# Patient Record
Sex: Male | Born: 1945 | Race: White | Hispanic: No | Smoking: Former smoker
Health system: Southern US, Community
[De-identification: ages and names within clinical notes are randomized; demographics above are authoritative.]

## PROBLEM LIST (undated history)

## (undated) DIAGNOSIS — I10 Essential (primary) hypertension: Secondary | ICD-10-CM

## (undated) DIAGNOSIS — C679 Malignant neoplasm of bladder, unspecified: Secondary | ICD-10-CM

## (undated) DIAGNOSIS — E785 Hyperlipidemia, unspecified: Secondary | ICD-10-CM

## (undated) DIAGNOSIS — N289 Disorder of kidney and ureter, unspecified: Secondary | ICD-10-CM

## (undated) DIAGNOSIS — C801 Malignant (primary) neoplasm, unspecified: Secondary | ICD-10-CM

## (undated) HISTORY — DX: Hyperlipidemia, unspecified: E78.5

## (undated) HISTORY — PX: CORONARY STENT PLACEMENT: SHX1402

## (undated) HISTORY — DX: Essential (primary) hypertension: I10

---
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
## 2008-07-11 ENCOUNTER — Inpatient Hospital Stay (HOSPITAL_COMMUNITY): Admission: AD | Admit: 2008-07-11 | Discharge: 2008-07-13 | Payer: Self-pay | Admitting: Cardiology

## 2008-07-11 ENCOUNTER — Ambulatory Visit: Payer: Self-pay | Admitting: Cardiology

## 2008-07-22 ENCOUNTER — Ambulatory Visit: Payer: Self-pay | Admitting: Cardiology

## 2008-08-01 ENCOUNTER — Ambulatory Visit: Payer: Self-pay | Admitting: Cardiology

## 2008-10-13 ENCOUNTER — Ambulatory Visit: Payer: Self-pay | Admitting: Cardiology

## 2009-01-14 DIAGNOSIS — I200 Unstable angina: Secondary | ICD-10-CM

## 2009-01-14 DIAGNOSIS — F17200 Nicotine dependence, unspecified, uncomplicated: Secondary | ICD-10-CM | POA: Insufficient documentation

## 2009-01-14 DIAGNOSIS — E7800 Pure hypercholesterolemia, unspecified: Secondary | ICD-10-CM

## 2009-01-14 DIAGNOSIS — Z8679 Personal history of other diseases of the circulatory system: Secondary | ICD-10-CM | POA: Insufficient documentation

## 2009-01-14 DIAGNOSIS — I2510 Atherosclerotic heart disease of native coronary artery without angina pectoris: Secondary | ICD-10-CM | POA: Insufficient documentation

## 2009-08-11 ENCOUNTER — Telehealth: Payer: Self-pay | Admitting: Cardiology

## 2011-02-19 NOTE — Letter (Signed)
October 13, 2008    Birdie Riddle, MD  [address]  [place], [place] [address]   RE:  [name], [name]  MRN:  [id number]  /  DOB:  1946/03/14   Dear [name],   I had the pleasure of seeing your nice patient, David Rich, in the  office today in a followup visit.  From a cardiac standpoint, his chest  discomfort has essentially resolved.  As you know, he had had exertional  chest discomfort, and had a very positive stress test with a large area  of ischemia in the LAD territory.  He subsequently underwent stenting of  the diagonal with a drug-eluting stent.  His symptoms have been  essentially resolved since that time.  The clinical symptoms have  improved.  He did have problems with the Plavix, and subsequently was  placed Effient 10 mg daily, which he has continued.  The only  outstanding issue has been a problem that he has had a problem with his  penile prosthesis which he would like to get replaced.  This was ongoing  prior to even his coronary intervention.  They told him he would have to  be off all of his anticoagulants, and specifically, I told him that we  would prefer that he not be off FEN for at least 1 year.  After 1 year,  he could be on aspirin alone, but we are always obviously reluctant to  discontinue aspirin in any patient with a drug-eluting stent.  He denies  any ongoing chest pain at present.   Also, in reviewing the records he had told us previously that he had a  chest x-ray in your office, but we have no record of this.  He said he  was not aware of any abnormalities, but I am going to have my our office  staff contact your nurse to check on these results because he should  have one if he has had not.   Today on physical examination, he is alert and oriented and distress.  The blood pressure is 128/80, the pulse is 56.  The lung fields are  clear.  The cardiac rhythm is regular.   Electrocardiogram demonstrates sinus bradycardia and is otherwise  within  normal limits.   To summarize, David Rich is done well.  He does likely need to have a  lipid level, and I have encouraged him to call your office to get this  checked.  He has had a problem with statins in the past, but I would  certainly leave this to your discretion as to if and when he should be  treated given your area of expertise.  With regard to his cardiac  situation, he should remain on dual-antiplatelet therapy for a minimum  of a year.  After that, there remains some debate as to whether ongoing  dual-antiplatelet therapy is warranted.  Given his elective initial  presentation, it would be reasonable to consider discontinuation after 1  year with continuation of indefinite aspirin.   Thanks so much for allowing [place] to share in his care.    Sincerely,      Arturo Morton. Riley Kill, MD, [facility]  Electronically Signed    TDS/MedQ  DD: 10/13/2008  DT: 10/14/2008  Job #: [id number]

## 2011-02-19 NOTE — Letter (Signed)
August 01, 2008    Alan Mulder, MD  [address]  [place], [place] [address]   RE:  David Rich, David Rich  MRN:  [id number]  /  DOB:  [birth year]   Dear [name],   I had the pleasure of seeing, Antwine Agosto in the office today in a  followup visit.  As you know, he underwent stenting of the diagonal  coronary artery since discharge from the hospital.  He has done  relatively well.  He did develop a rash, which was presumed to be from  clopidogrel.  As a result, he was switched to prasugrel.  He and I also  discussed his aspirin dosing today.  In general, the patient has be  getting along well.  He has not had significant recurrence of symptoms  other than little bit of indigestion.  He has had a little bit of minor  bruising, but a CBC done shortly after his procedure was fairly  unremarkable.   MEDICATIONS:  1. Atenolol 25 mg daily.  2. Effient 10 mg daily.  3. Aspirin 81 mg 3 tablets daily.  4. Simvastatin 20 mg daily.   PHYSICAL EXAMINATION:  GENERAL:  He is alert and oriented, in no acute  distress.  VITAL SIGNS:  The blood pressure is 124/70 and the pulse is 55.  LUNGS:  Fields are clear.  CARDIAC:  Rhythm is regular.  ABDOMEN:  The groin is well healed.   The electrocardiogram demonstrates sinus bradycardia, and is otherwise  within normal limits.   To briefly summarize, this very nice gentleman is doing well from a  clinical standpoint.   PLAN:  I planned to have him return to see me in followup in  approximately 2 months.  I have asked him to make an appointment to see  you in followup.  At 6 months' time, I would recommend a simple  treadmill to make sure that he is able to exercise on a regular basis.  I have encouraged him to stop smoking.  I have also asked him to make an  appointment to discuss his future use of AndroGel.  Thanks so much for  allowing me to share in his care.  On exam today, the clopidogrel rash  has apparently faded.     Sincerely,      Arturo Morton. Riley Kill, MD, [facility]  Electronically Signed    TDS/MedQ  DD: 08/01/2008  DT: 08/02/2008  Job #: [id number]

## 2011-02-19 NOTE — H&P (Signed)
David Rich, David Rich                ACCOUNT NO.:  [id number]   MEDICAL RECORD NO.:  [id number]          PATIENT TYPE:  INP   LOCATION:  4734                         FACILITY:  [facility]   PHYSICIAN:  Arturo Morton. Riley Kill, MD, FACCDATE OF BIRTH:  15-Mar-1946   DATE OF ADMISSION:  07/11/2008  DATE OF DISCHARGE:                              HISTORY & PHYSICAL   REFERRING PHYSICIAN:  Alan Mulder, MD   CHIEF COMPLAINT:  Chest pain.   HISTORY OF PRESENT ILLNESS:  David Rich is a 65 year old gentleman who is  referred by Dr. Patrecia Rich.  He has a 2-week history complaining of  intermittent chest discomfort.  It lasts up to 30 minutes and does not  always have to be associated with activity, however, he cannot do all  that much recently.  He has been intolerant of Crestor and Lipitor in  the past and he wondered whether Trilipix was making him feel worse.  He  has a 25-year history of smoking.  The discomfort has not gotten any  worse over the past week or so, but he saw Dr. Patrecia Rich for further  evaluation.  Following this, the patient underwent radionuclide imaging.  This revealed a large stress-induced defect of the anterior wall seen on  3 projections with near complete redistribution.  There was an ejection  fraction of 50% with dilatation of the LV post stress.  On the exercise  electrocardiogram, he had multifocal PVCs and one 1 mm of ST depression  lasting about 3 minutes in the recovery period.  I got a call from Dr.  Patrecia Rich on Friday, we talked about admitting the patient, however, the  patient did not want to come in and as a result, the patient was brought  in today for further evaluation.  The stress images do reveal a complete  anterolateral area of redistribution.   PAST MEDICAL HISTORY:  The patient has not had prior surgery.  The  patient has had prior surgical penile implant for congenital anomaly,  hypospadias in 1995.  He did have an aspiration of meniscus for what was  thought to be a meniscal tear.   ALLERGIES:  There are no known allergies.  He does have intolerances to  the STATINS.   CURRENT MEDICATIONS:  1. Aspirin 325 mg daily.  2. Atenolol 25 mg daily.  3. Nitroglycerin p.r.n.  4. He has been on Trilipix 135 mg at bedtime.  He had previously been      on Crestor and also Lipitor.  5. He has also been taking an AndroGel pump.   SOCIAL HISTORY:  The patient smokes.  He has been smoking for 25 years.  He has been working full-time Automotive engineer at [employer].  He is married.  He does not regularly exercise.  He does  use caffeine as well.   FAMILY HISTORY:  Father died at 72 of a cerebral hemorrhage.  His mother  died at 19, but she did have a myocardial infarction.  The patient has a  twin brother.   REVIEW OF SYSTEMS:  His vision and hearing  have been intact.  He has had  no ear, nose, and throat symptoms.  He has not had any GI bleeding,  nausea, vomiting, or diarrhea.  The review of systems is otherwise  negative.   PHYSICAL EXAMINATION:  He is alert, oriented gentleman in no acute  distress.  Blood pressure is 131/72, the pulse is 49.  Jugular veins are  nondistended.  HEENT examination is unremarkable.  Carotid upstrokes are brisk.  PMI is  nondisplaced.  There is a normal first and second heart sound without  murmur, rub, or gallop.  The lung fields are clear to auscultation and  percussion.  Pulses are intact distally.   The electrocardiogram was entirely within normal limits.   IMPRESSION:  1. Recent chest pain with markedly abnormal stress myocardial      perfusion imaging study.  2. History of hypercholesterolemia.  3. History of statin intolerance.  4. Continued tobacco abuse.  5. Prior history of penile implant.   PLAN:  We plan to admit the patient to the hospital.  Risks, benefits,  and alternatives have been discussed with the patient.  We have  recommended cardiac catheterization.  He is  agreeable to proceed.      Arturo Morton. Riley Kill, MD, [facility]  Electronically Signed     TDS/MEDQ  D:  07/11/2008  T:  07/12/2008  Job:  [id number]   cc:   David Rich, M.D.

## 2011-02-19 NOTE — Cardiovascular Report (Signed)
David Rich, David Rich                ACCOUNT NO.:  [id number]   MEDICAL RECORD NO.:  [id number]          PATIENT TYPE:  INP   LOCATION:  6526                         FACILITY:  [facility]   PHYSICIAN:  David Morton. Riley Kill, MD, FACCDATE OF BIRTH:  August 17, 1946   DATE OF PROCEDURE:  07/12/2008  DATE OF DISCHARGE:                            CARDIAC CATHETERIZATION   INDICATIONS:  David Rich is a 65 year old gentleman who has had recurrent  episodes of chest and arm discomfort.  Radionuclide imaging has  demonstrated a large anteroapical perfusion defect.  He was referred for  further evaluation.  Cardiac catheterization was recommended.  Risks,  benefits, and alternatives were discussed with the patient in the office  in detail and he was admitted overnight because of rest chest pain.   PROCEDURES:  1. Left heart catheterization.  2. Selective coronary arteriography.  3. Selective left ventriculography.  4. Percutaneous transluminal coronary angioplasty and stenting of the      diagonal coronary artery.   DESCRIPTION OF THE PROCEDURE:  The patient was brought to the  Catheterization Laboratory and prepped and draped in the usual fashion.  Through an anterior puncture, the femoral artery was easily entered, a 5-  French sheath was placed.  We took views of the left and right coronary  arteries in multiple angiographic projections.  Central aortic and left  ventricular pressures were measured with pigtail.  Ventriculography was  then performed in the RAO projection.  There were no complications.  I  then reviewed the films with the patient, and also went out and  discussed them with his wife.  There was a high-grade lesion in the  diagonal coronary artery, which is at least a moderately large vessel.  As a result, a percutaneous intervention was recommended based upon his  class III symptoms.  The risks and alternatives to intervention were  discussed and the patient was agreeable to proceed.   The patient was agreeable to proceed, and subsequent to this, the 5-  [place] sheath was upgraded to a 6-French.  A 600 mg of oral clopidogrel  was administered as long with aspirin.  Bivalirudin was given according  to protocol.  ACT was checked and found to be appropriate for  percutaneous intervention.  A JL-4 guide was utilized.  We put down a  0.014 [place] wire without difficulty into the distal diagonal.  We  then predilated using a 2.25 x 8 Apex balloon.  After thoughtful  consideration, we felt that the lesion could be covered with a 12-mm  stent.  We debated for some time about whether to use a 2.25 or 2.5 mm  stent.  The area distal to the lesion was thought to be somewhat suspect  for a 2.5-mm stent, and as a result of that, we placed a 2.25 x 12 Adam  drug-eluting platform.  This was deployed at 15 atmospheres.  We then  went ahead and postdilated the stent, using a 2.5 Higginson Voyager balloon and  this was taken to 16 atmospheres.  Following this, we elected to still  upgrade to a 2.75 [place] Voyager, and  this was dilated between 12 and 13  atmospheres.  There was marked improvement in the appearance of the  artery with just a very minimal residual stenosis and excellent flow  into the distal vessel.  All catheters were subsequently removed and  final angiographic shots obtained.  There were no major complications.   HEMODYNAMIC DATA:  1. Central aortic pressure was 119/59.  2. Left ventricular pressure was 102/13.  3. There was no significant gradient on pullback across the aortic      valve.   ANGIOGRAPHIC DATA:  1. On plain fluoroscopy, there did not appear to be significant      coronary calcification.  2. Ventriculography was done in the RAO projection.  Overall, systolic      function was preserved.  Ejection fraction would be estimated at      65%.  No wall motion abnormalities were identified.  There did not      appear to be significant mitral regurgitation.  3. The  left main coronary was free of critical disease.  4. The left anterior descending artery courses into the apex.  It      provides a major diagonal.  The proximal LAD in the LAO cranial      view does have an eccentric plaque that would measure between 20      and 30% luminal reduction.  There was mild segmental plaquing after      the takeoff of the diagonal.  The diagonal itself has a fairly      short discrete 90% stenosis.  The vessel distally has some moderate      luminal irregularity.  Following stenting, this stenosis was      reduced to less than 10% residual luminal narrowing.  5. The circumflex provides a marginal, and an AV circumflex.  There is      minimal luminal irregularity but no evidence of high-grade      obstruction.  6. The right coronary artery is a moderately large vessel providing      posterior descending posterolateral system.  There is eccentric      plaquing of about 30% in the midvessel but no high-grade stenosis.   IMPRESSION:  1. Well-preserved left ventricular function.  2. Single-vessel coronary artery disease with a high-grade large      diagonal with successful percutaneous stenting using a drug-eluting      platform.   DISPOSITION:  At the present time, the patient would be treated  medically.  Aspirin and Plavix will be required for a minimum of 1 year  and potentially longer depending upon ongoing research trials.  We will  have him follow up in the office with [place].      David Morton. Riley Kill, MD, [facility]  Electronically Signed     TDS/MEDQ  D:  07/12/2008  T:  07/12/2008  Job:  [id number]   cc:   David Rich, M.D.  [facility]

## 2011-02-19 NOTE — Discharge Summary (Signed)
David Rich, David Rich                ACCOUNT NO.:  [id number]   MEDICAL RECORD NO.:  [id number]          PATIENT TYPE:  INP   LOCATION:  6526                         FACILITY:  [facility]   PHYSICIAN:  Arturo Morton. Riley Kill, MD, FACCDATE OF BIRTH:  1945-10-30   DATE OF ADMISSION:  07/11/2008  DATE OF DISCHARGE:  07/13/2008                               DISCHARGE SUMMARY   PRIMARY CARDIOLOGIST:  Maisie Fus D. Riley Kill, MD, [facility]   PRIMARY CARE PHYSICIAN:  Alan Mulder, MD   PROCEDURES PERFORMED DURING HOSPITALIZATION:  1. Cardiac catheterization performed by Dr. Shawnie Pons, completed      on July 12, 2008.      a.     Well-preserved left ventricular function with single vessel       coronary artery disease with high-grade large diagonal.      b.     Successful percutaneous coronary intervention using drug-       eluting platform secondary to distal diagonal stenosis of 90%       which was reduced to less than 10% following stenting.   FINAL DISCHARGE DIAGNOSES:  1. Coronary artery disease.      a.     Status post cardiac catheterization revealing single vessel       disease with high-grade large diagonal lesion.      b.     Status post percutaneous coronary intervention using a drug-       eluting stent to the diagonal decreasing lesion from 90% stenosis       to 10% stenosis.  2. Hypercholesterolemia.  3. Statin intolerance.  4. Tobacco abuse.  5. Erectile dysfunction with penile implant.   HOSPITAL COURSE:  This is a 65 year old Caucasian male who was referred  to Dr. Riley Kill by Dr. Patrecia Pace after 2-week history of complaining of  intermittent chest discomfort lasting approximately 30 minutes, not  always associated with activity.  The patient was intolerant to STATINS  in the past, and was started on Triflex and began to feel worse on this.  The patient also had a history of a 25 year smoking history.  The  patient did undergo radionuclide imaging revealing a large stress-  induced defect at the anterior wall seen at 3 projections and with near  complete redistribution with an EF of 50% secondary to this, the patient  was admitted to have cardiac catheterization for definitive evaluation  of coronary anatomy.   The patient did undergo cardiac catheterization, Dr. Riley Kill on July 12, 2008, with results discussed above, please see Dr. Rosalyn Charters serial  cardiac catheterization note for more details.  The patient tolerated  the procedure well without evidence of bleeding, hematoma, or signs of  infection or arrhythmias post procedure.  The patient had no further  complaints of chest discomfort.   The patient also had smoking cessation counseling and has agreed to quit  smoke, cold [place] on discharge.  The patient had gotten up walking  with cardiac rehab during the hospitalization and did not have any  complaints of chest discomfort with exertion.   On day of discharge,  the patient was seen and examined by Dr. Shawnie Pons and was found to be stable.  The patient will go home with the  addition of Plavix to current medication regimen and follow up with Dr.  Riley Kill in 2 weeks.   DISCHARGE VITAL SIGNS:  Blood pressure 126/65, pulse 69, respirations  16, and temperature 95%.   LABORATORIES ON DISCHARGE:  Sodium 134, potassium 3.8, chloride 100, CO2  23, glucose 96, BUN 8, and creatinine 0.63.  Hemoglobin 15.0, hematocrit  44.2, white blood cells 11.4, and platelets 214.  Cardiac enzymes  revealing troponin 0.03, 0.04, and 0.02 respectively.  The patient's PTT  was 30, PT 13.4, and INR of 1.0.   DISCHARGE MEDICATIONS:  1. Aspirin 325 mg daily.  2. Atenolol 25 mg daily.  3. Nitroglycerin 0.4 mg sublingual p.r.n. pain.  4. Plavix 75 mg p.o. daily (prescription provided).   ALLERGIES:  No known drug allergies.   FOLLOWUP PLANS AND APPOINTMENT:  1. The patient will follow up with Dr. Shawnie Pons on August 01, 2008, at 2 p.m.  2. The  patient has been given postcardiac catheterization instructions      with particular emphasis on the right groin site for evidence of      bleeding, hematoma, or signs of infection.  3. The patient to follow up with primary care physician on his own      accord for continued medical management.  4. The patient has been advised to bring all medications to follow up      appointment.   Time spent with the patient to include physician time of 40 minutes.      Bettey Mare. Lyman Bishop, NP      Arturo Morton. Riley Kill, MD, [facility]  Electronically Signed    KML/MEDQ  D:  07/13/2008  T:  07/13/2008  Job:  [id number]   cc:   Alan Mulder, M.D.

## 2011-02-22 NOTE — Discharge Summary (Signed)
NAMESHAMIR, TUZZOLINO                ACCOUNT NO.:  [id number]   MEDICAL RECORD NO.:  [id number]          PATIENT TYPE:  INP   LOCATION:  6526                         FACILITY:  [facility]   PHYSICIAN:  Arturo Morton. Riley Kill, MD, FACCDATE OF BIRTH:  01-Aug-1946   DATE OF ADMISSION:  07/11/2008  DATE OF DISCHARGE:  07/13/2008                               DISCHARGE SUMMARY   CARDIOLOGIST:  Maisie Fus D. Riley Kill, MD, [facility]   PRIMARY CARE PHYSICIAN:  Alan Mulder, MD   PROCEDURES PERFORMED DURING HOSPITALIZATION:  1. Cardiac catheterization per Dr. Shawnie Pons, on July 12, 2008.  2. Status post percutaneous coronary intervention to the diagonal,      reducing it from 90% to 0% using a Taxus Liberty drug-eluting stent      2.25 mm x 12 mm.   FINAL DISCHARGE DIAGNOSES:  1. Coronary artery disease.  1a.  Status post cardiac catheterization on July 12, 2008, revealing  90% diagonal stenosis.  1. Unstable angina.  2. History of congestive heart failure.  3. Tobacco abuse.   HOSPITAL COURSE:  This is a 65 year old male referred to [place] by Dr.  Patrecia Pace with a 2-week history of intermittent chest discomfort lasting  approximately 30 minutes and has not always have to be associated with  activity.  The patient's chest discomfort became worse over the week  prior to admission.  He saw Dr. Patrecia Pace for further evaluation and the  patient had a nuclear medicine stress test, it revealed a large stress  inducing defect in the anterior wall seen in 3 projections with near  complete redistribution with an EF of 30%.  As a result of this, the  patient planned for cardiac catheterization secondary to this abnormal  stress test.  The patient was noted on EKG to have ST depression lasting  about 3 minutes in the recovery post exercise.   The patient was seen and examined by Dr. Shawnie Pons and cardiac  catheterization was planned.  The patient tolerated the procedure well  without any evidence of  bleeding, hematoma, or signs of infection.  It  was found that the patient had a 90% diagonal lesion, which was  intervened upon by Dr. Riley Kill using a Taxus drug-eluting stent 12 x  2.25 mm, reducing it from 95% to less than 10%.  His EF remained normal  at 65%.   The patient was followed post procedure and was started on statins.  He  was also enrolled in the ADAPT-DES stent study.  He had some tobacco  cessation during hospitalization by specified staff and verbalized  understanding and stated that he would stop smoking.  The patient also  worked with cardiac rehab and was up walking around without any  recurrence of angina.  The patient was seen and examined by Dr. Shawnie Pons on day of discharge and found to be stable and he was discharged  home with follow up with Dr. Riley Kill in approximately 2 weeks.   DISCHARGE LABORATORIES:  Sodium 134, potassium 3.8, chloride 100, CO2 of  23, BUN 8, creatinine 0.83, and glucose 96.  Hemoglobin  15.0, hematocrit  44.2, white blood cells 11.4, and platelets 214.  Cardiac enzymes were  cycled and troponin 0.02, 0.03, and 0.02 respectively.   VITAL SIGNS ON DISCHARGE:  Blood pressure 126/65, pulse 69, respirations  16, temperature 98.9, and O2 sat 95% on room air.   DISCHARGE MEDICATIONS:  1. Aspirin 325 daily.  2. Atenolol 25 mg daily.  3. Nitroglycerin 0.4 mg sublingual p.r.n. pain.  4. Plavix 75 mg p.o. daily.   ALLERGIES:  No known drug allergies.   FOLLOWUP PLANS AND APPOINTMENTS:  The patient will be followed up by Dr.  Shawnie Pons on August 01, 2008, at 2 p.m.   The patient has been given post cardiac catheterization instructions  with particular emphasis on the right groin site for evidence of  bleeding, hematoma, or signs of infection.  The patient is to follow up  with primary care physician for continued medical management.   Time spent with the patient to include physician time 40 minutes.      Bettey Mare. Lyman Bishop,  NP      Arturo Morton. Riley Kill, MD, [facility]  Electronically Signed    KML/MEDQ  D:  08/22/2008  T:  08/23/2008  Job:  [id number]

## 2011-02-25 ENCOUNTER — Ambulatory Visit: Payer: Self-pay | Admitting: Gastroenterology

## 2011-07-09 LAB — COMPREHENSIVE METABOLIC PANEL
ALT: 26
AST: 19
Albumin: 3.5
Alkaline Phosphatase: 68
Chloride: 107
GFR calc Af Amer: >60
Potassium: 3.7
Sodium: 138
Total Bilirubin: 0.4
Total Protein: 5.9 — ABNORMAL LOW

## 2011-07-09 LAB — BASIC METABOLIC PANEL
BUN: 8
CO2: 23
Chloride: 100
Creatinine, Ser: 0.83
Glucose, Bld: 96
Potassium: 3.8

## 2011-07-09 LAB — PROTIME-INR: INR: 1

## 2011-07-09 LAB — CBC
HCT: 41.4
HCT: 41.8
HCT: 44.2
Hemoglobin: 14.2
Hemoglobin: 14.2
MCHC: 33.9
MCHC: 34
MCHC: 34.3
MCV: 94.9
MCV: 95
Platelets: 214
RBC: 4.36
RBC: 4.36
RDW: 14.6
WBC: 11.4 — ABNORMAL HIGH
WBC: 9.4

## 2011-07-09 LAB — CARDIAC PANEL(CRET KIN+CKTOT+MB+TROPI)
CK, MB: 1.8
CK, MB: 2
Relative Index: 1.8
Relative Index: 2
Relative Index: INVALID
Total CK: 101
Troponin I: 0.04

## 2011-07-09 LAB — HEPARIN LEVEL (UNFRACTIONATED): Heparin Unfractionated: 0.42

## 2011-10-08 DIAGNOSIS — C801 Malignant (primary) neoplasm, unspecified: Secondary | ICD-10-CM

## 2011-10-08 HISTORY — DX: Malignant (primary) neoplasm, unspecified: C80.1

## 2011-10-08 HISTORY — PX: OTHER SURGICAL HISTORY: SHX169

## 2012-03-23 ENCOUNTER — Ambulatory Visit: Payer: Self-pay | Admitting: Urology

## 2012-03-23 LAB — BASIC METABOLIC PANEL
BUN: 16 mg/dL (ref 7–18)
Calcium, Total: 8.8 mg/dL (ref 8.5–10.1)
Co2: 25 mmol/L (ref 21–32)
Glucose: 83 mg/dL (ref 65–99)
Potassium: 4.1 mmol/L (ref 3.5–5.1)
Sodium: 139 mmol/L (ref 136–145)

## 2012-03-24 LAB — URINE CULTURE

## 2012-03-25 ENCOUNTER — Ambulatory Visit: Payer: Self-pay | Admitting: Urology

## 2012-04-22 DIAGNOSIS — I2510 Atherosclerotic heart disease of native coronary artery without angina pectoris: Secondary | ICD-10-CM

## 2012-04-28 DIAGNOSIS — C679 Malignant neoplasm of bladder, unspecified: Secondary | ICD-10-CM | POA: Insufficient documentation

## 2012-04-28 DIAGNOSIS — N529 Male erectile dysfunction, unspecified: Secondary | ICD-10-CM | POA: Insufficient documentation

## 2012-05-09 ENCOUNTER — Emergency Department: Payer: Self-pay | Admitting: *Deleted

## 2012-08-06 DIAGNOSIS — T83490A Other mechanical complication of penile (implanted) prosthesis, initial encounter: Secondary | ICD-10-CM | POA: Insufficient documentation

## 2015-01-29 NOTE — Op Note (Signed)
PATIENT NAME:  David Rich, David Rich MR#:  [id number] DATE OF BIRTH:  [birth year]  DATE OF PROCEDURE:  03/25/2012  PREOPERATIVE DIAGNOSES:  1. Gross hematuria.  2. Right ureteral mass. 3. Bladder lesion.   POSTOPERATIVE DIAGNOSES:  1. Gross hematuria.  2. Right ureteral mass. 3. Bladder lesion.   PROCEDURES: 1. Right ureteroscopy with biopsy right ureteral tumor. 2. Placement of right ureteral stent.  3. Transurethral resection of bladder tumor-small.    SURGEON: Brynja Marker C. Bernardo Heater, MD  ASSISTANT: None.   ANESTHETIC: General.   INDICATION: 69 year old male with a history of intermittent gross hematuria. CT urogram was remarkable for a 3 cm mass in the right mid ureter with proximal hydronephrosis and hydroureter. Office cystoscopy showed a 5 mm lesion at the bladder neck.   DESCRIPTION OF PROCEDURE: Patient was taken to the Operating Room where a general anesthetic was administered. He was placed in the low lithotomy position and his external genitalia were prepped and draped in the usual fashion. Time out was performed per protocol. A 27 French continuous flow resectoscope was used with the visual obturator. It was lubricated and passed per urethra. The urethra was normal in caliber without stricture. Prostate with moderate lateral lobe enlargement and moderate bladder neck elevation. The sheath was advanced into the bladder. The [facility] resectoscope with loop was then placed into the sheath. Bladder mucosa was closely inspected. The 5 mm lesion at the bladder neck was smooth and mucosal covered. This was resected. No other lesions were identified. This lesion was resected with one pass of the loop. Resection site was cauterized. The adapter was placed in the resectoscope sheath. A 5 French open-ended ureteral catheter was placed in the distal ureter. Right retrograde pyelogram remarkable for a filling defect in the right mid ureter with a goblet sign. There was proximal hydroureter and  hydronephrosis. A 0.035 guidewire was placed through the ureteral catheter and passed up to the renal pelvis. The ureteral catheter and resectoscope were removed. A 6 French semirigid ureteroscope was lubricated and passed under direct vision. The right ureteral orifice was easily engaged without dilation. In the mid ureter a papillary tumor was identified consistent with a transitional cell carcinoma endoscopically. Pieces of the tumor were biopsied several times with transureteroscopic Pirana forceps. A fairly deepened portion of tissue was able to be snared with a three prong grasper. No significant bleeding was noted. The ureteroscope was removed. A 6 French/26 cm contour ureteral stent placed over the wire. Cystoscope was repassed. Distal end of stent was well positioned in the bladder. Proximal end of stent was well positioned under fluoroscopic visualization. The bladder resection site was free of bleeding. It was elected not place a Foley catheter. Bladder was emptied and cystoscope was removed. Patient taken to PAC-U in stable condition. There were no sessions. EBL minimal.  ____________________________ Ronda Fairly. Bernardo Heater, MD scs:cms D: 03/25/2012 19:41:59 ET T: 03/26/2012 08:53:27 ET JOB#: [id number]  cc: Nicki Reaper C. Bernardo Heater, MD, <Dictator>  Abbie Sons MD ELECTRONICALLY SIGNED 04/20/2012 9:10

## 2015-09-19 DIAGNOSIS — C801 Malignant (primary) neoplasm, unspecified: Secondary | ICD-10-CM | POA: Insufficient documentation

## 2016-07-11 DIAGNOSIS — M1610 Unilateral primary osteoarthritis, unspecified hip: Secondary | ICD-10-CM | POA: Insufficient documentation

## 2016-07-11 DIAGNOSIS — M5136 Other intervertebral disc degeneration, lumbar region: Secondary | ICD-10-CM | POA: Insufficient documentation

## 2017-07-29 ENCOUNTER — Emergency Department
Admission: EM | Admit: 2017-07-29 | Discharge: 2017-07-29 | Disposition: A | Discharge status: Home / Self Care | Payer: Medicare Other | Attending: Emergency Medicine | Admitting: Emergency Medicine

## 2017-07-29 ENCOUNTER — Encounter: Payer: Self-pay | Admitting: Emergency Medicine

## 2017-07-29 DIAGNOSIS — I509 Heart failure, unspecified: Secondary | ICD-10-CM | POA: Diagnosis not present

## 2017-07-29 DIAGNOSIS — I110 Hypertensive heart disease with heart failure: Secondary | ICD-10-CM | POA: Insufficient documentation

## 2017-07-29 DIAGNOSIS — Z87898 Personal history of other specified conditions: Secondary | ICD-10-CM

## 2017-07-29 DIAGNOSIS — R040 Epistaxis: Secondary | ICD-10-CM | POA: Diagnosis not present

## 2017-07-29 DIAGNOSIS — I10 Essential (primary) hypertension: Secondary | ICD-10-CM

## 2017-07-29 HISTORY — DX: Disorder of kidney and ureter, unspecified: N28.9

## 2017-07-29 MED ORDER — AMLODIPINE BESYLATE 2.5 MG PO TABS: 2.5000 mg | ORAL_TABLET | Freq: Every day | ORAL | 0 refills | Status: DC

## 2017-07-29 MED ORDER — AMLODIPINE BESYLATE 5 MG PO TABS
2.5000 mg | ORAL_TABLET | Freq: Once | ORAL | Status: AC
  Administered 2017-07-29: 2.5 mg via ORAL
  Filled 2017-07-29: qty 1

## 2017-07-29 NOTE — ED Notes (Addendum)
Denies headaches, nausea, and SOB. States he does not take blood pressure medications, denies Hx of hypertension, denies chest pain, denies dizziness. Nose is not bleeding at this time.

## 2017-07-29 NOTE — ED Notes (Signed)
Patient does not appear to be in any acute distress at time of discharge. Patient ambulatory to lobby with steady gate. Patient denies any comments or concerns regarding discharge.  

## 2017-07-29 NOTE — ED Notes (Signed)
AAOx3.  Skin warm and dry.  NAD 

## 2017-07-29 NOTE — ED Triage Notes (Addendum)
Patient presents to ED via POV from home with c/o epistaxis and high blood pressure. Patient denies CP or SOB. Patient denies history of high blood pressure. No nose bleed at this time.

## 2017-07-29 NOTE — Discharge Instructions (Signed)
Attend your scheduled appointment with Dr. Lovie Macadamia tomorrow 9:30 for continued care.  If symptoms worsen prior to your scheduled appointment do not hesitate returning to emergency department.  If the medications worsen symptoms do not hesitate to return to emergency department.

## 2017-07-29 NOTE — ED Provider Notes (Signed)
[facility] Emergency Department Provider Note   ____________________________________________   I have reviewed the triage vital signs and the nursing notes.   HISTORY  Chief Complaint Hypertension    HPI David Rich is a 71 y.o. male presents to the emergency department with elevated blood pressure and third episode of a epistaxis. Patient reports no past history of hypertension however he notes over the last week with standing and mild exertion intermittent nosebleeds. He has taken his blood pressure, noting it to be increased. Patient denies headache, blurred vision, nausea, double vision, or weakness. Patient came for evaluation after today's nosebleed. He reports the bleeding lasted for 15 minutes. Patient takes 162 mg ASA, fish oil and garlic daily. He is a non-smoker. Patient has an appointment with his PCP tomorrow at 9:30 am. Patient reports BP readings ~180/90's although triage reading was not at elevated. Measurement taken during history was more elevated, 184/96. No symptoms or epistaxis noted.  Patient denies fever, chills, headache, vision changes, chest pain, chest tightness, shortness of breath, abdominal pain, nausea and vomiting.  Past Medical History:  Diagnosis Date  . Renal disorder     Patient Active Problem List   Diagnosis Date Noted  . HYPERCHOLESTEROLEMIA 01/14/2009  . TOBACCO ABUSE 01/14/2009  . ANGINA, UNSTABLE 01/14/2009  . CAD 01/14/2009  . CONGESTIVE HEART FAILURE, HX OF 01/14/2009    Past Surgical History:  Procedure Laterality Date  . CORONARY STENT PLACEMENT      Prior to Admission medications   Medication Sig Start Date End Date Taking? Authorizing Provider  amLODipine (NORVASC) 2.5 MG tablet Take 1 tablet (2.5 mg total) by mouth daily. 07/29/17 07/29/18  Little, Traci M, PA-C    Allergies Statins  No family history on file.  Social History Social History  Substance Use Topics  . Smoking status: Never  Smoker  . Smokeless tobacco: Never Used  . Alcohol use Yes     Comment: Social    Review of Systems Constitutional: Negative for fever/chills Eyes: No visual changes. Cardiovascular: Denies chest pain. Respiratory: Denies shortness of breath. Gastrointestinal: No abdominal pain.  No nausea, vomiting, diarrhea. Musculoskeletal: Negative for back pain. Skin: Negative for rash. Neurological: Negative for headaches.  Negative focal weakness or numbness.  ____________________________________________   PHYSICAL EXAM:  VITAL SIGNS: Patient Vitals for the past 24 hrs:  BP Temp Temp src Pulse Resp SpO2 Height Weight  07/29/17 1622 (!) 184/96 - - 67 16 98 % - -  07/29/17 1529 (!) 151/77 - - - - - 6' (1.829 m) 86.2 kg (190 lb)  07/29/17 1527 - 97.9 F (36.6 C) Oral 72 14 98 % - -    Constitutional: Alert and oriented. Well appearing and in no acute distress.  Eyes: Conjunctivae are normal. PERRL. EOMI  Head: Normocephalic and atraumatic. ENT:      Ears: Canals clear. TMs intact bilaterally.      Nose: No congestion/rhinnorhea. Residual dried blood in nares.       Mouth/Throat: Mucous membranes are moist. Cardiovascular: Normal rate, regular rhythm. Normal S1 and S2.  Good peripheral circulation. Respiratory: Normal respiratory effort without tachypnea or retractions. Lungs CTAB.  Musculoskeletal: Nontender with normal range of motion in all extremities. Neurologic: Normal speech and language. No gross focal neurologic deficits are appreciated. No gait instability.  Skin:  Skin is warm, dry and intact. No rash noted. Psychiatric: Mood and affect are normal. Speech and behavior are normal. Patient exhibits appropriate insight and judgement.  ____________________________________________   LABS (all labs ordered are listed, but only abnormal results are displayed)  Labs Reviewed - No data to  display ____________________________________________  EKG none ____________________________________________  RADIOLOGY none ____________________________________________   PROCEDURES  Procedure(s) performed: no    Critical Care performed: no ____________________________________________   INITIAL IMPRESSION / ASSESSMENT AND PLAN / ED COURSE  Pertinent labs & imaging results that were available during my care of the patient were reviewed by me and considered in my medical decision making (see chart for details).  Patient presents to emergency department with episodes of elevated blood pressure and epistaxis. History and physical exam findings are consistent with likely new onset hypertension. Patient will be following up with his primary care provider tomorrow for continued care. Patient will be prescribed short course amloidpine 2.5 mg until follow up. I highly recommended him to attend this appointment in that his PCP would be the best to manage and monitor his blood pressure changes long term. Reassessment prior to discharge was reassuring and patient verbalized attending PCP appointment tomorrow. Patient advised to return to the emergency department if symptoms return or worsen prior to attending the PCP if needed. Patient informed of clinical course, understand medical decision-making process, and agree with plan.   FINAL CLINICAL IMPRESSION(S) / ED DIAGNOSES  Final diagnoses:  Hypertension, unspecified type  H/O epistaxis       NEW MEDICATIONS STARTED DURING THIS VISIT:  Discharge Medication List as of 07/29/2017  4:38 PM    START taking these medications   Details  amLODipine (NORVASC) 2.5 MG tablet Take 1 tablet (2.5 mg total) by mouth daily., Starting Tue 07/29/2017, Until Wed 07/29/2018, Print         Note:  This document was prepared using Dragon voice recognition software and may include unintentional dictation errors.    Jerolyn Shin, PA-C 07/29/17  1715    Eula Listen, MD 07/29/17 2259

## 2017-10-01 DIAGNOSIS — I10 Essential (primary) hypertension: Secondary | ICD-10-CM | POA: Insufficient documentation

## 2018-03-20 ENCOUNTER — Other Ambulatory Visit: Payer: Self-pay

## 2018-03-20 ENCOUNTER — Encounter: Payer: Self-pay | Admitting: Oncology

## 2018-03-20 ENCOUNTER — Inpatient Hospital Stay: Payer: Medicare Other | Attending: Oncology | Admitting: Oncology

## 2018-03-20 VITALS — BP 149/85 | HR 73 | Temp 97.0°F | Resp 18 | Ht 71.65 in | Wt 192.2 lb

## 2018-03-20 DIAGNOSIS — Z87891 Personal history of nicotine dependence: Secondary | ICD-10-CM

## 2018-03-20 DIAGNOSIS — Z803 Family history of malignant neoplasm of breast: Secondary | ICD-10-CM

## 2018-03-20 DIAGNOSIS — C679 Malignant neoplasm of bladder, unspecified: Secondary | ICD-10-CM | POA: Diagnosis not present

## 2018-03-20 NOTE — Progress Notes (Signed)
[facility]  [phone] Fax:([phone]) [phone]  ID: David Rich OB: 10-29-1945  MR#: [id number]  HCW#:[id number]  Patient Care Team: Juluis Pitch, MD as PCP - General (Family Medicine)  CHIEF COMPLAINT: Urothelial carcinoma.  INTERVAL HISTORY: Patient is a 72 year old male with a long-standing history of noninvasive urothelial carcinoma.  He recently underwent TURBT on Feb 18, 2018 that revealed recurrent and extravesical disease.  Subsequent MRI of the pelvis revealed likely residual tumor.  Patient was referred by his primary care physician for second opinion and transfer care.  He currently feels well and is asymptomatic.  He has no neurologic complaints.  He denies any recent fevers or illnesses.  He has a good appetite and denies weight loss.  He has no chest pain or shortness of breath.  He denies any nausea, vomiting, constipation, or diarrhea.  He has no current urinary complaints.  Patient offers no specific complaints today.  REVIEW OF SYSTEMS:   Review of Systems  Constitutional: Negative.  Negative for fever, malaise/fatigue and weight loss.  Respiratory: Negative.  Negative for cough and shortness of breath.   Cardiovascular: Negative.  Negative for chest pain and leg swelling.  Gastrointestinal: Negative.  Negative for abdominal pain.  Genitourinary: Negative.  Negative for dysuria and hematuria.  Musculoskeletal: Negative.  Negative for back pain.  Skin: Negative.  Negative for rash.  Neurological: Negative.  Negative for sensory change, focal weakness and weakness.  Psychiatric/Behavioral: Negative.  The patient is not nervous/anxious.     As per HPI. Otherwise, a complete review of systems is negative.  PAST MEDICAL HISTORY: Past Medical History:  Diagnosis Date  . Hyperlipidemia   . Hypertension   . Renal disorder     PAST SURGICAL HISTORY: Past Surgical History:  Procedure Laterality Date  . CORONARY STENT PLACEMENT    .  kidney removed Left 2013    FAMILY HISTORY: Family History  Problem Relation Age of Onset  . Coronary artery disease Mother   . Anemia Mother   . Kidney failure Mother   . Subarachnoid hemorrhage Father   . Breast cancer Sister 18  . Hypertension Brother     ADVANCED DIRECTIVES (Y/N):  N  HEALTH MAINTENANCE: Social History   Tobacco Use  . Smoking status: Former Smoker    Packs/day: 1.00    Years: 55.00    Pack years: 55.00    Types: Cigarettes    Last attempt to quit: 03/20/2012    Years since quitting: 6.0  . Smokeless tobacco: Never Used  Substance Use Topics  . Alcohol use: Yes    Comment: Social  . Drug use: No     Colonoscopy:  PAP:  Bone density:  Lipid panel:  Allergies  Allergen Reactions  . Clopidogrel Rash and Hives  . Rosuvastatin Calcium Anaphylaxis  . Etodolac Nausea And Vomiting and Other (See Comments)    Bad dreams Bad dreams   . Hydrocodone Other (See Comments)    Bad dreams Bad dreams   . Other   . Statins   . Meloxicam Rash    Bad dreams Bad dreams   . Pravastatin Sodium Rash    Current Outpatient Medications  Medication Sig Dispense Refill  . amLODipine (NORVASC) 2.5 MG tablet Take 1 tablet (2.5 mg total) by mouth daily. 7 tablet 0  . aspirin EC 81 MG tablet Take by mouth.    . Garlic 176 MG TABS Take by mouth.     . metoprolol tartrate (LOPRESSOR) 25 MG  tablet Take 25 mg by mouth.     . Multiple Vitamin (MULTI-VITAMINS) TABS Take by mouth.    [name] acetaminophen (TYLENOL) 325 MG tablet Take by mouth.     No current facility-administered medications for this visit.     OBJECTIVE: Vitals:   03/20/18 1333  BP: (!) 149/85  Pulse: 73  Resp: 18  Temp: (!) 97 F (36.1 C)     Body mass index is 26.33 kg/m.    ECOG FS:0 - Asymptomatic  General: Well-developed, well-nourished, no acute distress. Eyes: Pink conjunctiva, anicteric sclera. HEENT: Normocephalic, moist mucous membranes, clear oropharnyx. Lungs: Clear to  auscultation bilaterally. Heart: Regular rate and rhythm. No rubs, murmurs, or gallops. Abdomen: Soft, nontender, nondistended. No organomegaly noted, normoactive bowel sounds. Musculoskeletal: No edema, cyanosis, or clubbing. Neuro: Alert, answering all questions appropriately. Cranial nerves grossly intact. Skin: No rashes or petechiae noted. Psych: Normal affect. Lymphatics: No cervical, calvicular, axillary or inguinal LAD.   LAB RESULTS:  Lab Results  Component Value Date   NA 139 03/23/2012   K 4.1 03/23/2012   CL 108 (H) 03/23/2012   CO2 25 03/23/2012   GLUCOSE 83 03/23/2012   BUN 16 03/23/2012   CREATININE 1.27 03/23/2012   CALCIUM 8.8 03/23/2012   PROT 5.9 (L) 07/11/2008   ALBUMIN 3.5 07/11/2008   AST 19 07/11/2008   ALT 26 07/11/2008   ALKPHOS 68 07/11/2008   BILITOT 0.4 07/11/2008   GFRNONAA 60 (L) 03/23/2012   GFRAA >60 03/23/2012    Lab Results  Component Value Date   WBC 11.4 (H) 07/13/2008   HGB 15.0 07/13/2008   HCT 44.2 07/13/2008   MCV 94.9 07/13/2008   PLT 214 07/13/2008     STUDIES: No results found.  ASSESSMENT: Urothelial carcinoma, unclear stage.  PLAN:  1.  Urothelial carcinoma: Unclear stage.  Patient appears to have extravesical disease.  He wishes to avoid surgery to have his bladder removed, but expressed understanding this may be his only option for possible cure.  He is willing to do chemotherapy and possibly XRT if needed.  Will get a PET scan to evaluate the extent of his disease and then discuss further at tumor board next week.  Patient will follow-up in approximately 1 week after his PET scan to discuss the results and treatment planning if necessary.  I spent a total of 60 minutes face-to-face with the patient of which greater than 50% of the visit was spent in counseling and coordination of care as summarized above.  Patient expressed understanding and was in agreement with this plan. He also understands that He can call clinic  at any time with any questions, concerns, or complaints.   Cancer Staging No matching staging information was found for the patient.  [name], MD   03/24/2018 10:56 PM

## 2018-03-20 NOTE — Progress Notes (Signed)
Here for new pt evaluation. Here w daughter .( a nurse at [employer] )

## 2018-03-23 DIAGNOSIS — C679 Malignant neoplasm of bladder, unspecified: Secondary | ICD-10-CM | POA: Insufficient documentation

## 2018-03-23 NOTE — Progress Notes (Signed)
[place]  [phone] [phone]  ID: David Rich OB: 05/01/1946  MR#: [id number]  GYI#:[id number]  Patient Care Team: Juluis Pitch, MD as PCP - General (Family Medicine)  CHIEF COMPLAINT: Urothelial carcinoma  INTERVAL HISTORY: Patient returns to clinic today for further evaluation, discussion of his imaging results, and treatment planning.  He continues to feel well and remains asymptomatic. He has no neurologic complaints.  He denies any recent fevers or illnesses.  He has a good appetite and denies weight loss.  He has no chest pain or shortness of breath.  He denies any nausea, vomiting, constipation, or diarrhea.  He has no current urinary complaints.  Patient offers no specific complaints today.  REVIEW OF SYSTEMS:   Review of Systems  Constitutional: Negative.  Negative for fever, malaise/fatigue and weight loss.  Respiratory: Negative.  Negative for cough and shortness of breath.   Cardiovascular: Negative.  Negative for chest pain and leg swelling.  Gastrointestinal: Negative.  Negative for abdominal pain.  Genitourinary: Negative.  Negative for hematuria.  Musculoskeletal: Negative.  Negative for back pain.  Skin: Negative.  Negative for rash.  Neurological: Negative.  Negative for sensory change, focal weakness and weakness.  Psychiatric/Behavioral: Negative.  The patient is not nervous/anxious.     As per HPI. Otherwise, a complete review of systems is negative.  PAST MEDICAL HISTORY: Past Medical History:  Diagnosis Date  . Hyperlipidemia   . Hypertension   . Renal disorder     PAST SURGICAL HISTORY: Past Surgical History:  Procedure Laterality Date  . CORONARY STENT PLACEMENT    . kidney removed Left 2013    FAMILY HISTORY: Family History  Problem Relation Age of Onset  . Coronary artery disease Mother   . Anemia Mother   . Kidney failure Mother   . Subarachnoid hemorrhage Father   . Breast cancer  Sister 30  . Hypertension Brother     ADVANCED DIRECTIVES (Y/N):  N  HEALTH MAINTENANCE: Social History   Tobacco Use  . Smoking status: Former Smoker    Packs/day: 1.00    Years: 55.00    Pack years: 55.00    Types: Cigarettes    Last attempt to quit: 03/20/2012    Years since quitting: 6.0  . Smokeless tobacco: Never Used  Substance Use Topics  . Alcohol use: Yes    Comment: Social  . Drug use: No     Colonoscopy:  PAP:  Bone density:  Lipid panel:  Allergies  Allergen Reactions  . Clopidogrel Rash and Hives  . Rosuvastatin Calcium Anaphylaxis  . Etodolac Nausea And Vomiting and Other (See Comments)    Bad dreams Bad dreams   . Hydrocodone Other (See Comments)    Bad dreams Bad dreams   . Other   . Statins   . Meloxicam Rash    Bad dreams Bad dreams   . Pravastatin Sodium Rash    Current Outpatient Medications  Medication Sig Dispense Refill  . acetaminophen (TYLENOL) 325 MG tablet Take by mouth.    [name] amLODipine (NORVASC) 2.5 MG tablet Take 1 tablet (2.5 mg total) by mouth daily. 7 tablet 0  . aspirin EC 81 MG tablet Take by mouth.    . Garlic 350 MG TABS Take by mouth.     . metoprolol tartrate (LOPRESSOR) 25 MG tablet Take 25 mg by mouth.     . Multiple Vitamin (MULTI-VITAMINS) TABS Take by mouth.    . Omega-3 Fatty Acids (FISH OIL  OMEGA-3) 1000 MG CAPS Take by mouth daily.     No current facility-administered medications for this visit.     OBJECTIVE: Vitals:   03/26/18 1555  BP: (!) 159/86  Pulse: 68  Resp: 18  Temp: (!) 97.5 F (36.4 C)     Body mass index is 26.47 kg/m.    ECOG FS:0 - Asymptomatic  General: Well-developed, well-nourished, no acute distress. Eyes: Pink conjunctiva, anicteric sclera. HEENT: Normocephalic, moist mucous membranes, clear oropharnyx. Lungs: Clear to auscultation bilaterally. Heart: Regular rate and rhythm. No rubs, murmurs, or gallops. Abdomen: Soft, nontender, nondistended. No organomegaly noted,  normoactive bowel sounds. Musculoskeletal: No edema, cyanosis, or clubbing. Neuro: Alert, answering all questions appropriately. Cranial nerves grossly intact. Skin: No rashes or petechiae noted. Psych: Normal affect. Lymphatics: No cervical, calvicular, axillary or inguinal LAD.   LAB RESULTS:  Lab Results  Component Value Date   NA 139 03/23/2012   K 4.1 03/23/2012   CL 108 (H) 03/23/2012   CO2 25 03/23/2012   GLUCOSE 83 03/23/2012   BUN 16 03/23/2012   CREATININE 1.27 03/23/2012   CALCIUM 8.8 03/23/2012   PROT 5.9 (L) 07/11/2008   ALBUMIN 3.5 07/11/2008   AST 19 07/11/2008   ALT 26 07/11/2008   ALKPHOS 68 07/11/2008   BILITOT 0.4 07/11/2008   GFRNONAA 60 (L) 03/23/2012   GFRAA >60 03/23/2012    Lab Results  Component Value Date   WBC 11.4 (H) 07/13/2008   HGB 15.0 07/13/2008   HCT 44.2 07/13/2008   MCV 94.9 07/13/2008   PLT 214 07/13/2008     STUDIES: Nm Pet Image Initial (pi) Skull Base To Thigh  Result Date: 03/25/2018 CLINICAL DATA:  Initial treatment strategy for bladder cancer. EXAM: NUCLEAR MEDICINE PET SKULL BASE TO THIGH TECHNIQUE: 10.41 mCi F-18 FDG was injected intravenously. Full-ring PET imaging was performed from the skull base to thigh after the radiotracer. CT data was obtained and used for attenuation correction and anatomic localization. Fasting blood glucose: 95 mg/dl COMPARISON:  None. FINDINGS: Mediastinal blood pool activity: SUV max 2.05 NECK: No definite neck mass or cervical lymphadenopathy. There is an enlarged and hypermetabolic supraclavicular node on the left side this measures 11 mm on image number 66 and has an SUV max of 9.67. Incidental CT findings: none CHEST: No axillary lymphadenopathy. No enlarged or hypermetabolic mediastinal or hilar lymph nodes. Small right-sided retrocrural lymph nodes with hypermetabolism and SUV max of 5.27. No worrisome pulmonary lesions to suggest pulmonary metastatic disease or primary lung neoplasm. There are  emphysematous changes and pulmonary scarring. Incidental CT findings: none ABDOMEN/PELVIS: 18 mm left para-aortic retroperitoneal lymph node on image number 161 is hypermetabolic with SUV max of 09.6. Multiple other smaller left-sided retroperitoneal lymph nodes are noted. 10 mm left common iliac artery lymph node on image number 218 has an SUV max of 10.9. 6 mm right pelvic sidewall lymph node on image number 248 has an SUV max of 4.35. No inguinal adenopathy Incidental CT findings: Slightly irregular right-sided bladder wall thickening without obvious mass. Penile prosthesis is noted. Extensive colonic diverticulosis without findings for acute diverticulitis. Status post right nephrectomy. SKELETON: No focal hypermetabolic activity to suggest skeletal metastasis. Incidental CT findings: none IMPRESSION: 1. Left supraclavicular, right retrocrural, retroperitoneal and pelvic metastatic adenopathy. 2. No solid organ metastasis, pulmonary metastasis or osseous metastasis. Electronically Signed   By: Marijo Sanes M.D.   On: 03/25/2018 14:04    ASSESSMENT: Urothelial carcinoma  PLAN:    1. Urothelial carcinoma:  At least stage IIIb.  PET scan results from March 25, 2018 reviewed independently report as above confirming stage.  Patient noted to have PET positive supraclavicular node which would make disease stage IV.  Because of this is an unusual site of metastasis, have ordered ultrasound-guided biopsy to confirm.  Given his extensive lymphadenopathy, will proceed with systemic chemotherapy using gemcitabine and cisplatin.  Because patient has had previous nephrectomy, will use a split dose of cisplatin.  Patient will receive gemcitabine and cisplatin on day 1 and 8 and have day 15 off.  This will be a 21-day cycle.  Continue to monitor kidney function closely throughout treatment.  Plan to do 4 cycles then reimage.   Patient will require port placement prior to initiating treatment.  Return to clinic on April 08, 2018 to initiate cycle 1, day 1 of treatment.   2.  Genetic testing: Patient qualifies for genetic testing and a referral will be made at next clinic appointment.  I spent a total of 30 minutes face-to-face with the patient of which greater than 50% of the visit was spent in counseling and coordination of care as summarized above.  Patient expressed understanding and was in agreement with this plan. He also understands that He can call clinic at any time with any questions, concerns, or complaints.   Cancer Staging Urothelial carcinoma of bladder ([place]) Staging form: Urinary Bladder, AJCC 8th Edition - Clinical: Stage IIIB (cT4a, cN2, cM0) - Signed by [name], MD on 03/26/2018   [name], MD   03/30/2018 8:44 AM

## 2018-03-25 ENCOUNTER — Encounter
Admission: RE | Admit: 2018-03-25 | Discharge: 2018-03-25 | Disposition: A | Discharge status: Home / Self Care | Payer: Medicare Other | Source: Ambulatory Visit | Attending: Oncology | Admitting: Oncology

## 2018-03-25 DIAGNOSIS — C679 Malignant neoplasm of bladder, unspecified: Secondary | ICD-10-CM | POA: Diagnosis not present

## 2018-03-25 LAB — GLUCOSE, CAPILLARY: GLUCOSE-CAPILLARY: 95 mg/dL (ref 65–99)

## 2018-03-25 MED ORDER — TECHNETIUM TC 99M TETROFOSMIN IV KIT
10.4100 | PACK | Freq: Once | INTRAVENOUS | Status: AC | PRN
  Administered 2018-03-25: 10.41 via INTRAVENOUS

## 2018-03-26 ENCOUNTER — Inpatient Hospital Stay (HOSPITAL_BASED_OUTPATIENT_CLINIC_OR_DEPARTMENT_OTHER): Payer: Medicare Other | Admitting: Oncology

## 2018-03-26 ENCOUNTER — Encounter: Payer: Self-pay | Admitting: Oncology

## 2018-03-26 VITALS — BP 159/86 | HR 68 | Temp 97.5°F | Resp 18 | Wt 193.3 lb

## 2018-03-26 DIAGNOSIS — Z87891 Personal history of nicotine dependence: Secondary | ICD-10-CM | POA: Diagnosis not present

## 2018-03-26 DIAGNOSIS — Z803 Family history of malignant neoplasm of breast: Secondary | ICD-10-CM

## 2018-03-26 DIAGNOSIS — C679 Malignant neoplasm of bladder, unspecified: Secondary | ICD-10-CM

## 2018-03-26 DIAGNOSIS — Z7189 Other specified counseling: Secondary | ICD-10-CM

## 2018-03-26 NOTE — Progress Notes (Signed)
Patient denies any concerns today.  

## 2018-03-26 NOTE — Progress Notes (Signed)
START OFF PATHWAY REGIMEN - Bladder   [id number]:Gemcitabine + Cisplatin (split-dose cisplatin) q21 days:   A cycle is every 21 days:     Gemcitabine      Cisplatin   **Always confirm dose/schedule in your pharmacy ordering system**  Patient Characteristics: Pre Cystectomy, Clinical T4b, Any N, M0 AJCC M Category: M0 AJCC N Category: NX AJCC T Category: TX Current evidence of distant metastases<= No AJCC 8 Stage Grouping: IIIB Intent of Therapy: Non-Curative / Palliative Intent, Discussed with Patient

## 2018-03-27 ENCOUNTER — Telehealth: Payer: Self-pay | Admitting: *Deleted

## 2018-03-27 NOTE — Telephone Encounter (Signed)
Left vm for pt regarding US guided biopsy and port placement scheduled for Friday 6/28. Patient to arrive at 9:30 am.

## 2018-03-30 ENCOUNTER — Other Ambulatory Visit: Payer: Self-pay | Admitting: Oncology

## 2018-03-30 DIAGNOSIS — C679 Malignant neoplasm of bladder, unspecified: Secondary | ICD-10-CM

## 2018-03-30 DIAGNOSIS — Z7189 Other specified counseling: Secondary | ICD-10-CM | POA: Insufficient documentation

## 2018-03-30 MED ORDER — ONDANSETRON HCL 8 MG PO TABS: 8.0000 mg | ORAL_TABLET | Freq: Two times a day (BID) | ORAL | 2 refills | Status: DC | PRN

## 2018-03-30 MED ORDER — LIDOCAINE-PRILOCAINE 2.5-2.5 % EX CREA: TOPICAL_CREAM | CUTANEOUS | 3 refills | Status: DC

## 2018-03-30 MED ORDER — PROCHLORPERAZINE MALEATE 10 MG PO TABS: 10.0000 mg | ORAL_TABLET | Freq: Four times a day (QID) | ORAL | 2 refills | Status: DC | PRN

## 2018-03-31 ENCOUNTER — Other Ambulatory Visit: Payer: Medicare Other

## 2018-04-01 NOTE — Patient Instructions (Signed)
Gemcitabine injection What is this medicine? GEMCITABINE (jem SIT a been) is a chemotherapy drug. This medicine is used to treat many types of cancer like breast cancer, lung cancer, pancreatic cancer, and ovarian cancer. This medicine may be used for other purposes; ask your health care provider or pharmacist if you have questions. COMMON BRAND NAME(S): Gemzar What should I tell my health care provider before I take this medicine? They need to know if you have any of these conditions: -blood disorders -infection -kidney disease -liver disease -recent or ongoing radiation therapy -an unusual or allergic reaction to gemcitabine, other chemotherapy, other medicines, foods, dyes, or preservatives -pregnant or trying to get pregnant -breast-feeding How should I use this medicine? This drug is given as an infusion into a vein. It is administered in a hospital or clinic by a specially trained health care professional. Talk to your pediatrician regarding the use of this medicine in children. Special care may be needed. Overdosage: If you think you have taken too much of this medicine contact a poison control center or emergency room at once. NOTE: This medicine is only for you. Do not share this medicine with others. What if I miss a dose? It is important not to miss your dose. Call your doctor or health care professional if you are unable to keep an appointment. What may interact with this medicine? -medicines to increase blood counts like filgrastim, pegfilgrastim, sargramostim -some other chemotherapy drugs like cisplatin -vaccines Talk to your doctor or health care professional before taking any of these medicines: -acetaminophen -aspirin -ibuprofen -ketoprofen -naproxen This list may not describe all possible interactions. Give your health care provider a list of all the medicines, herbs, non-prescription drugs, or dietary supplements you use. Also tell them if you smoke, drink alcohol,  or use illegal drugs. Some items may interact with your medicine. What should I watch for while using this medicine? Visit your doctor for checks on your progress. This drug may make you feel generally unwell. This is not uncommon, as chemotherapy can affect healthy cells as well as cancer cells. Report any side effects. Continue your course of treatment even though you feel ill unless your doctor tells you to stop. In some cases, you may be given additional medicines to help with side effects. Follow all directions for their use. Call your doctor or health care professional for advice if you get a fever, chills or sore throat, or other symptoms of a cold or flu. Do not treat yourself. This drug decreases your body's ability to fight infections. Try to avoid being around people who are sick. This medicine may increase your risk to bruise or bleed. Call your doctor or health care professional if you notice any unusual bleeding. Be careful brushing and flossing your teeth or using a toothpick because you may get an infection or bleed more easily. If you have any dental work done, tell your dentist you are receiving this medicine. Avoid taking products that contain aspirin, acetaminophen, ibuprofen, naproxen, or ketoprofen unless instructed by your doctor. These medicines may hide a fever. Women should inform their doctor if they wish to become pregnant or think they might be pregnant. There is a potential for serious side effects to an unborn child. Talk to your health care professional or pharmacist for more information. Do not breast-feed an infant while taking this medicine. What side effects may I notice from receiving this medicine? Side effects that you should report to your doctor or health care professional as   soon as possible: -allergic reactions like skin rash, itching or hives, swelling of the face, lips, or tongue -low blood counts - this medicine may decrease the number of white blood cells,  red blood cells and platelets. You may be at increased risk for infections and bleeding. -signs of infection - fever or chills, cough, sore throat, pain or difficulty passing urine -signs of decreased platelets or bleeding - bruising, pinpoint red spots on the skin, black, tarry stools, blood in the urine -signs of decreased red blood cells - unusually weak or tired, fainting spells, lightheadedness -breathing problems -chest pain -mouth sores -nausea and vomiting -pain, swelling, redness at site where injected -pain, tingling, numbness in the hands or feet -stomach pain -swelling of ankles, feet, hands -unusual bleeding Side effects that usually do not require medical attention (report to your doctor or health care professional if they continue or are bothersome): -constipation -diarrhea -hair loss -loss of appetite -stomach upset This list may not describe all possible side effects. Call your doctor for medical advice about side effects. You may report side effects to FDA at 1-800-FDA-1088. Where should I keep my medicine? This drug is given in a hospital or clinic and will not be stored at home. NOTE: This sheet is a summary. It may not cover all possible information. If you have questions about this medicine, talk to your doctor, pharmacist, or health care provider.  2018 Elsevier/Gold Standard (2008-02-02 18:45:54) Cisplatin injection What is this medicine? CISPLATIN (SIS pla tin) is a chemotherapy drug. It targets fast dividing cells, like cancer cells, and causes these cells to die. This medicine is used to treat many types of cancer like bladder, ovarian, and testicular cancers. This medicine may be used for other purposes; ask your health care provider or pharmacist if you have questions. COMMON BRAND NAME(S): Platinol, Platinol -AQ What should I tell my health care provider before I take this medicine? They need to know if you have any of these conditions: -blood  disorders -hearing problems -kidney disease -recent or ongoing radiation therapy -an unusual or allergic reaction to cisplatin, carboplatin, other chemotherapy, other medicines, foods, dyes, or preservatives -pregnant or trying to get pregnant -breast-feeding How should I use this medicine? This drug is given as an infusion into a vein. It is administered in a hospital or clinic by a specially trained health care professional. Talk to your pediatrician regarding the use of this medicine in children. Special care may be needed. Overdosage: If you think you have taken too much of this medicine contact a poison control center or emergency room at once. NOTE: This medicine is only for you. Do not share this medicine with others. What if I miss a dose? It is important not to miss a dose. Call your doctor or health care professional if you are unable to keep an appointment. What may interact with this medicine? -dofetilide -foscarnet -medicines for seizures -medicines to increase blood counts like filgrastim, pegfilgrastim, sargramostim -probenecid -pyridoxine used with altretamine -rituximab -some antibiotics like amikacin, gentamicin, neomycin, polymyxin B, streptomycin, tobramycin -sulfinpyrazone -vaccines -zalcitabine Talk to your doctor or health care professional before taking any of these medicines: -acetaminophen -aspirin -ibuprofen -ketoprofen -naproxen This list may not describe all possible interactions. Give your health care provider a list of all the medicines, herbs, non-prescription drugs, or dietary supplements you use. Also tell them if you smoke, drink alcohol, or use illegal drugs. Some items may interact with your medicine. What should I watch for while using this   medicine? Your condition will be monitored carefully while you are receiving this medicine. You will need important blood work done while you are taking this medicine. This drug may make you feel generally  unwell. This is not uncommon, as chemotherapy can affect healthy cells as well as cancer cells. Report any side effects. Continue your course of treatment even though you feel ill unless your doctor tells you to stop. In some cases, you may be given additional medicines to help with side effects. Follow all directions for their use. Call your doctor or health care professional for advice if you get a fever, chills or sore throat, or other symptoms of a cold or flu. Do not treat yourself. This drug decreases your body's ability to fight infections. Try to avoid being around people who are sick. This medicine may increase your risk to bruise or bleed. Call your doctor or health care professional if you notice any unusual bleeding. Be careful brushing and flossing your teeth or using a toothpick because you may get an infection or bleed more easily. If you have any dental work done, tell your dentist you are receiving this medicine. Avoid taking products that contain aspirin, acetaminophen, ibuprofen, naproxen, or ketoprofen unless instructed by your doctor. These medicines may hide a fever. Do not become pregnant while taking this medicine. Women should inform their doctor if they wish to become pregnant or think they might be pregnant. There is a potential for serious side effects to an unborn child. Talk to your health care professional or pharmacist for more information. Do not breast-feed an infant while taking this medicine. Drink fluids as directed while you are taking this medicine. This will help protect your kidneys. Call your doctor or health care professional if you get diarrhea. Do not treat yourself. What side effects may I notice from receiving this medicine? Side effects that you should report to your doctor or health care professional as soon as possible: -allergic reactions like skin rash, itching or hives, swelling of the face, lips, or tongue -signs of infection - fever or chills, cough,  sore throat, pain or difficulty passing urine -signs of decreased platelets or bleeding - bruising, pinpoint red spots on the skin, black, tarry stools, nosebleeds -signs of decreased red blood cells - unusually weak or tired, fainting spells, lightheadedness -breathing problems -changes in hearing -gout pain -low blood counts - This drug may decrease the number of white blood cells, red blood cells and platelets. You may be at increased risk for infections and bleeding. -nausea and vomiting -pain, swelling, redness or irritation at the injection site -pain, tingling, numbness in the hands or feet -problems with balance, movement -trouble passing urine or change in the amount of urine Side effects that usually do not require medical attention (report to your doctor or health care professional if they continue or are bothersome): -changes in vision -loss of appetite -metallic taste in the mouth or changes in taste This list may not describe all possible side effects. Call your doctor for medical advice about side effects. You may report side effects to FDA at 1-800-FDA-1088. Where should I keep my medicine? This drug is given in a hospital or clinic and will not be stored at home. NOTE: This sheet is a summary. It may not cover all possible information. If you have questions about this medicine, talk to your doctor, pharmacist, or health care provider.  2018 Elsevier/Gold Standard (2007-12-29 14:40:54)  

## 2018-04-02 ENCOUNTER — Inpatient Hospital Stay: Payer: Medicare Other

## 2018-04-02 ENCOUNTER — Other Ambulatory Visit: Payer: Self-pay | Admitting: Student

## 2018-04-02 ENCOUNTER — Other Ambulatory Visit: Payer: Self-pay | Admitting: Radiology

## 2018-04-03 ENCOUNTER — Ambulatory Visit
Admission: RE | Admit: 2018-04-03 | Discharge: 2018-04-03 | Disposition: A | Discharge status: Home / Self Care | Payer: Medicare Other | Source: Ambulatory Visit | Attending: Oncology | Admitting: Oncology

## 2018-04-03 ENCOUNTER — Encounter: Payer: Self-pay | Admitting: Interventional Radiology

## 2018-04-03 DIAGNOSIS — C799 Secondary malignant neoplasm of unspecified site: Secondary | ICD-10-CM | POA: Diagnosis not present

## 2018-04-03 DIAGNOSIS — E785 Hyperlipidemia, unspecified: Secondary | ICD-10-CM | POA: Diagnosis not present

## 2018-04-03 DIAGNOSIS — C669 Malignant neoplasm of unspecified ureter: Secondary | ICD-10-CM | POA: Insufficient documentation

## 2018-04-03 DIAGNOSIS — N289 Disorder of kidney and ureter, unspecified: Secondary | ICD-10-CM | POA: Diagnosis not present

## 2018-04-03 DIAGNOSIS — Z87891 Personal history of nicotine dependence: Secondary | ICD-10-CM | POA: Diagnosis not present

## 2018-04-03 DIAGNOSIS — Z905 Acquired absence of kidney: Secondary | ICD-10-CM | POA: Diagnosis not present

## 2018-04-03 DIAGNOSIS — C679 Malignant neoplasm of bladder, unspecified: Secondary | ICD-10-CM

## 2018-04-03 DIAGNOSIS — I10 Essential (primary) hypertension: Secondary | ICD-10-CM | POA: Insufficient documentation

## 2018-04-03 DIAGNOSIS — R590 Localized enlarged lymph nodes: Secondary | ICD-10-CM | POA: Diagnosis present

## 2018-04-03 HISTORY — PX: IR FLUORO GUIDE CV LINE RIGHT: IMG2283

## 2018-04-03 LAB — BASIC METABOLIC PANEL
Anion gap: 9 (ref 5–15)
BUN: 16 mg/dL (ref 8–23)
CALCIUM: 8.8 mg/dL — AB (ref 8.9–10.3)
CO2: 23 mmol/L (ref 22–32)
CREATININE: 1.22 mg/dL (ref 0.61–1.24)
Chloride: 105 mmol/L (ref 98–111)
GFR calc Af Amer: >60 mL/min (ref >60)
GFR, EST NON AFRICAN AMERICAN: 59 mL/min — AB (ref >60)
GLUCOSE: 102 mg/dL — AB (ref 70–99)
Potassium: 4 mmol/L (ref 3.5–5.1)
Sodium: 137 mmol/L (ref 135–145)

## 2018-04-03 LAB — PROTIME-INR
INR: 0.87
PROTHROMBIN TIME: 11.8 s (ref 11.4–15.2)

## 2018-04-03 LAB — CBC
HCT: 45.5 % (ref 40.0–52.0)
Hemoglobin: 15.3 g/dL (ref 13.0–18.0)
MCH: 32.3 pg (ref 26.0–34.0)
MCHC: 33.7 g/dL (ref 32.0–36.0)
MCV: 95.8 fL (ref 80.0–100.0)
Platelets: 253 10*3/uL (ref 150–440)
RBC: 4.75 MIL/uL (ref 4.40–5.90)
RDW: 14.4 % (ref 11.5–14.5)
WBC: 8.5 10*3/uL (ref 3.8–10.6)

## 2018-04-03 MED ORDER — MIDAZOLAM HCL 5 MG/5ML IJ SOLN
INTRAMUSCULAR | Status: DC | PRN
  Administered 2018-04-03 (×2): 1 mg via INTRAVENOUS

## 2018-04-03 MED ORDER — CEFAZOLIN SODIUM-DEXTROSE 2-4 GM/100ML-% IV SOLN
2.0000 g | INTRAVENOUS | Status: AC
  Administered 2018-04-03: 2 g via INTRAVENOUS

## 2018-04-03 MED ORDER — LIDOCAINE 1% INJECTION FOR CIRCUMCISION
INJECTION | INTRAVENOUS | Status: DC | PRN
  Administered 2018-04-03: 2 mL via SUBCUTANEOUS

## 2018-04-03 MED ORDER — FENTANYL CITRATE (PF) 100 MCG/2ML IJ SOLN
INTRAMUSCULAR | Status: DC | PRN
  Administered 2018-04-03 (×2): 50 ug via INTRAVENOUS

## 2018-04-03 MED ORDER — LIDOCAINE HCL (PF) 1 % IJ SOLN
30.0000 mL | Freq: Once | INTRAMUSCULAR | Status: DC
  Filled 2018-04-03: qty 30

## 2018-04-03 MED ORDER — SODIUM CHLORIDE 0.9 % IV SOLN
INTRAVENOUS | Status: DC
  Administered 2018-04-03: 10:00:00 via INTRAVENOUS

## 2018-04-03 NOTE — Procedures (Signed)
Pre Procedure Dx: Metastatic bladder cancer Post Procedural Dx: Same  Technically successful US guided biopsy of hypermetabolic left supraclavicular LN.  EBL: None  No immediate complications.   Ronny Bacon, MD Pager #: [phone]

## 2018-04-03 NOTE — Procedures (Signed)
Pre Procedure Dx: Metastatic Bladder Cancer Post Procedural Dx: Same  Successful placement of right IJ approach port-a-cath with tip at the superior caval atrial junction. The catheter is ready for immediate use.  Estimated Blood Loss: Minimal  Complications: None immediate.  Jay Marjan Rosman, MD Pager #: [phone]   

## 2018-04-03 NOTE — Consult Note (Signed)
Chief Complaint: Metastatic bladder cancer  Referring Physician(s): Finnegan,Timothy J  Patient Status: [facility] - Out-pt  History of Present Illness: David Rich is a 72 y.o. male hypertension and hyperlipidemia with recent diagnosis of metastatic bladder cancer.  Patient presents today for ultrasound-guided biopsy of left supraclavicular lymph node and image guided placement of a portacatheter.  Patient is unaccompanied and serves as his own historian.  Patient is without complaint.  Specifically, no fatigue, change in appetite, chest pain, shortness of breath, fever or chills.  Past Medical History:  Diagnosis Date  . Hyperlipidemia   . Hypertension   . Renal disorder     Past Surgical History:  Procedure Laterality Date  . CORONARY STENT PLACEMENT    . kidney removed Left 2013    Allergies: Clopidogrel; Rosuvastatin calcium; Etodolac; Hydrocodone; Other; Statins; Meloxicam; and Pravastatin sodium  Medications: Prior to Admission medications   Medication Sig Start Date End Date Taking? Authorizing Provider  acetaminophen (TYLENOL) 325 MG tablet Take by mouth. 02/18/18  Yes [provider]  amLODipine (NORVASC) 2.5 MG tablet Take 1 tablet (2.5 mg total) by mouth daily. 07/29/17 07/29/18 Yes Little, Traci M, PA-C  aspirin EC 81 MG tablet Take by mouth.   Yes [provider]  Garlic 950 MG TABS Take by mouth.    Yes [provider]  lidocaine-prilocaine (EMLA) cream Apply to affected area once 03/30/18  Yes Finnegan, [name], MD  metoprolol tartrate (LOPRESSOR) 25 MG tablet Take 25 mg by mouth.  09/19/17  Yes [provider]  Multiple Vitamin (MULTI-VITAMINS) TABS Take by mouth.   Yes [provider]  Omega-3 Fatty Acids (FISH OIL OMEGA-3) 1000 MG CAPS Take by mouth daily.   Yes [provider]  ondansetron (ZOFRAN) 8 MG tablet Take 1 tablet (8 mg total) by mouth 2 (two) times daily as needed. 03/30/18  Yes [name], MD  prochlorperazine (COMPAZINE) 10 MG tablet Take 1 tablet (10 mg total) by mouth every 6 (six) hours as needed (Nausea or vomiting). 03/30/18  Yes [name], MD     Family History  Problem Relation Age of Onset  . Coronary artery disease Mother   . Anemia Mother   . Kidney failure Mother   . Subarachnoid hemorrhage Father   . Breast cancer Sister 57  . Hypertension Brother     Social History   Socioeconomic History  . Marital status: Married    Spouse name: Not on file  . Number of children: Not on file  . Years of education: Not on file  . Highest education level: Not on file  Occupational History  . Not on file  Social Needs  . Financial resource strain: Not on file  . Food insecurity:    Worry: Not on file    Inability: Not on file  . Transportation needs:    Medical: Not on file    Non-medical: Not on file  Tobacco Use  . Smoking status: Former Smoker    Packs/day: 1.00    Years: 55.00    Pack years: 55.00    Types: Cigarettes    Last attempt to quit: 03/20/2012    Years since quitting: 6.0  . Smokeless tobacco: Never Used  Substance and Sexual Activity  . Alcohol use: Yes    Comment: Social  . Drug use: No  . Sexual activity: Not on file  Lifestyle  . Physical activity:    Days per week: Not on file  Minutes per session: Not on file  . Stress: Not on file  Relationships  . Social connections:    Talks on phone: Not on file    Gets together: Not on file    Attends religious service: Not on file    Active member of club or organization: Not on file    Attends meetings of clubs or organizations: Not on file    Relationship status: Not on file  Other Topics Concern  . Not on file  Social History Narrative  . Not on file    ECOG Status: 0 - Asymptomatic  Review of Systems: A 12 point ROS discussed and pertinent positives are indicated in the HPI above.  All other systems are negative.  Review of Systems  Constitutional:  Negative.   Respiratory: Negative.   Cardiovascular: Negative.   Gastrointestinal: Negative.   Genitourinary: Negative.     Vital Signs: BP 139/78   Pulse 62   Temp 98 F (36.7 C)   Resp 20   Ht 5\' 11"  (1.803 m)   Wt 193 lb (87.5 kg)   SpO2 98%   BMI 26.92 kg/m   Physical Exam  Constitutional: He appears well-developed and well-nourished.  HENT:  Head: Normocephalic and atraumatic.  Cardiovascular: Normal rate and regular rhythm.  Pulmonary/Chest: Effort normal and breath sounds normal.  Skin: Skin is warm and dry.  Psychiatric: He has a normal mood and affect. His behavior is normal. Thought content normal.  Nursing note and vitals reviewed.   Imaging: Nm Pet Image Initial (pi) Skull Base To Thigh  Result Date: 03/25/2018 CLINICAL DATA:  Initial treatment strategy for bladder cancer. EXAM: NUCLEAR MEDICINE PET SKULL BASE TO THIGH TECHNIQUE: 10.41 mCi F-18 FDG was injected intravenously. Full-ring PET imaging was performed from the skull base to thigh after the radiotracer. CT data was obtained and used for attenuation correction and anatomic localization. Fasting blood glucose: 95 mg/dl COMPARISON:  None. FINDINGS: Mediastinal blood pool activity: SUV max 2.05 NECK: No definite neck mass or cervical lymphadenopathy. There is an enlarged and hypermetabolic supraclavicular node on the left side this measures 11 mm on image number 66 and has an SUV max of 9.67. Incidental CT findings: none CHEST: No axillary lymphadenopathy. No enlarged or hypermetabolic mediastinal or hilar lymph nodes. Small right-sided retrocrural lymph nodes with hypermetabolism and SUV max of 5.27. No worrisome pulmonary lesions to suggest pulmonary metastatic disease or primary lung neoplasm. There are emphysematous changes and pulmonary scarring. Incidental CT findings: none ABDOMEN/PELVIS: 18 mm left para-aortic retroperitoneal lymph node on image number 892 is hypermetabolic with SUV max of 11.9. Multiple  other smaller left-sided retroperitoneal lymph nodes are noted. 10 mm left common iliac artery lymph node on image number 218 has an SUV max of 10.9. 6 mm right pelvic sidewall lymph node on image number 248 has an SUV max of 4.35. No inguinal adenopathy Incidental CT findings: Slightly irregular right-sided bladder wall thickening without obvious mass. Penile prosthesis is noted. Extensive colonic diverticulosis without findings for acute diverticulitis. Status post right nephrectomy. SKELETON: No focal hypermetabolic activity to suggest skeletal metastasis. Incidental CT findings: none IMPRESSION: 1. Left supraclavicular, right retrocrural, retroperitoneal and pelvic metastatic adenopathy. 2. No solid organ metastasis, pulmonary metastasis or osseous metastasis. Electronically Signed   By: Marijo Sanes M.D.   On: 03/25/2018 14:04    Labs:  CBC: Recent Labs    04/03/18 0929  WBC 8.5  HGB 15.3  HCT 45.5  PLT 253  COAGS: Recent Labs    04/03/18 0929  INR 0.87    BMP: Recent Labs    04/03/18 0929  NA 137  K 4.0  CL 105  CO2 23  GLUCOSE 102*  BUN 16  CALCIUM 8.8*  CREATININE 1.22  GFRNONAA 59*  GFRAA >60    LIVER FUNCTION TESTS: No results for input(s): BILITOT, AST, ALT, ALKPHOS, PROT, ALBUMIN in the last 8760 hours.  TUMOR MARKERS: No results for input(s): AFPTM, CEA, CA199, CHROMGRNA in the last 8760 hours.  Assessment and Plan:  David Rich is a 72 y.o. male hypertension and hyperlipidemia with recent diagnosis of metastatic bladder cancer.  Patient presents today for ultrasound-guided biopsy of left supraclavicular lymph node and image guided placement of a portacatheter.    Patient is without complaint.    Risks and benefits of ultrasound-guided biopsy of left supraclavicular lymph node was discussed with the patient including, but not limited to bleeding, infection, damage to adjacent structures or low yield requiring additional tests.  Risks and benefits  of image guided port-a-catheter placement was discussed with the patient including, but not limited to bleeding, infection, pneumothorax, or fibrin sheath development and need for additional procedures.  All of the patient's questions were answered, patient is agreeable to proceed.  Consent signed and in chart.  Thank you for this interesting consult.  I greatly enjoyed meeting YURIEL LOPEZMARTINEZ and look forward to participating in their care.  A copy of this report was sent to the requesting provider on this date.  Electronically Signed: Sandi Mariscal, MD 04/03/2018, 10:12 AM   I spent a total of 15 Minutes in face to face in clinical consultation, greater than 50% of which was counseling/coordinating care for US guided left supraclavicular lymph node biopsy and image guided port placement.

## 2018-04-03 NOTE — Sedation Documentation (Signed)
Total sedation : Versed 1 mg IV, Fentanyl 50 mcg IV. Pt. Tolerated well.

## 2018-04-03 NOTE — Discharge Instructions (Signed)
Moderate Conscious Sedation, Adult, Care After °These instructions provide you with information about caring for yourself after your procedure. Your health care provider may also give you more specific instructions. Your treatment has been planned according to current medical practices, but problems sometimes occur. Call your health care provider if you have any problems or questions after your procedure. °What can I expect after the procedure? °After your procedure, it is common: °· To feel sleepy for several hours. °· To feel clumsy and have poor balance for several hours. °· To have poor judgment for several hours. °· To vomit if you eat too soon. ° °Follow these instructions at home: °For at least 24 hours after the procedure: ° °· Do not: °? Participate in activities where you could fall or become injured. °? Drive. °? Use heavy machinery. °? Drink alcohol. °? Take sleeping pills or medicines that cause drowsiness. °? Make important decisions or sign legal documents. °? Take care of children on your own. °· Rest. °Eating and drinking °· Follow the diet recommended by your health care provider. °· If you vomit: °? Drink water, juice, or soup when you can drink without vomiting. °? Make sure you have little or no nausea before eating solid foods. °General instructions °· Have a responsible adult stay with you until you are awake and alert. °· Take over-the-counter and prescription medicines only as told by your health care provider. °· If you smoke, do not smoke without supervision. °· Keep all follow-up visits as told by your health care provider. This is important. °Contact a health care provider if: °· You keep feeling nauseous or you keep vomiting. °· You feel light-headed. °· You develop a rash. °· You have a fever. °Get help right away if: °· You have trouble breathing. °This information is not intended to replace advice given to you by your health care provider. Make sure you discuss any questions you have  with your health care provider. °Document Released: 07/14/2013 Document Revised: 02/26/2016 Document Reviewed: 01/13/2016 °Elsevier Interactive Patient Education © 2018 [employer] °Implanted Port Insertion, Care After °This sheet gives you information about how to care for yourself after your procedure. Your health care provider may also give you more specific instructions. If you have problems or questions, contact your health care provider. °What can I expect after the procedure? °After your procedure, it is common to have: °· Discomfort at the port insertion site. °· Bruising on the skin over the port. This should improve over 3-4 days. ° °Follow these instructions at home: °Port care °· After your port is placed, you will get a manufacturer's information card. The card has information about your port. Keep this card with you at all times. °· Take care of the port as told by your health care provider. Ask your health care provider if you or a family member can get training for taking care of the port at home. A home health care nurse may also take care of the port. °· Make sure to remember what type of port you have. °Incision care °· Follow instructions from your health care provider about how to take care of your port insertion site. Make sure you: °? Wash your hands with soap and water before you change your bandage (dressing). If soap and water are not available, use hand sanitizer. °? Change your dressing as told by your health care provider. °? Leave stitches (sutures), skin glue, or adhesive strips in place. These skin closures may need to stay   in place for 2 weeks or longer. If adhesive strip edges start to loosen and curl up, you may trim the loose edges. Do not remove adhesive strips completely unless your health care provider tells you to do that.  Check your port insertion site every day for signs of infection. Check for: ? More redness, swelling, or pain. ? More fluid or  blood. ? Warmth. ? Pus or a bad smell. General instructions  Do not take baths, swim, or use a hot tub until your health care provider approves.  Do not lift anything that is heavier than 10 lb (4.5 kg) for a week, or as told by your health care provider.  Ask your health care provider when it is okay to: ? Return to work or school. ? Resume usual physical activities or sports.  Do not drive for 24 hours if you were given a medicine to help you relax (sedative).  Take over-the-counter and prescription medicines only as told by your health care provider.  Wear a medical alert bracelet in case of an emergency. This will tell any health care providers that you have a port.  Keep all follow-up visits as told by your health care provider. This is important. Contact a health care provider if:  You cannot flush your port with saline as directed, or you cannot draw blood from the port.  You have a fever or chills.  You have more redness, swelling, or pain around your port insertion site.  You have more fluid or blood coming from your port insertion site.  Your port insertion site feels warm to the touch.  You have pus or a bad smell coming from the port insertion site. Get help right away if:  You have chest pain or shortness of breath.  You have bleeding from your port that you cannot control. Summary  Take care of the port as told by your health care provider.  Change your dressing as told by your health care provider.  Keep all follow-up visits as told by your health care provider. This information is not intended to replace advice given to you by your health care provider. Make sure you discuss any questions you have with your health care provider. Document Released: 07/14/2013 Document Revised: 08/14/2016 Document Reviewed: 08/14/2016 Elsevier Interactive Patient Education  2017 [place] Lymph Node Biopsy, Care After Refer to this sheet in the next few weeks.  These instructions provide you with information about caring for yourself after your procedure. Your health care provider may also give you more specific instructions. Your treatment has been planned according to current medical practices, but problems sometimes occur. Call your health care provider if you have any problems or questions after your procedure. What can I expect after the procedure? After the procedure, it is common to have:  Bruising.  Soreness.  Mild swelling.  Follow these instructions at home: Medicines  Take over-the-counter and prescription medicines only as told by your health care provider.  If you were prescribed an antibiotic medicine, take it as told by your health care provider. Do not stop taking the antibiotic even if you start to feel better. Incision care   Follow instructions from your health care provider about how to take care of your incision. Make sure you: ? Wash your hands with soap and water before you change your bandage (dressing). If soap and water are not available, use hand sanitizer. ? Change your dressing as told by your health care provider. ? Leave  stitches (sutures), skin glue, or adhesive strips in place. These skin closures may need to stay in place for 2 weeks or longer. If adhesive strip edges start to loosen and curl up, you may trim the loose edges. Do not remove adhesive strips completely unless your health care provider tells you to do that.  Check your incision area every day for signs of infection. Check for: ? More redness, swelling, or pain. ? More fluid or blood. ? Warmth. ? Pus or a bad smell. Driving  Do not drive for 24 hours if you received a sedative.  Do not drive or operate heavy machinery while taking prescription pain medicine. General instructions   It is your responsibility to get the results of your procedure. Ask your health care provider or the department performing the procedure when your results will be  ready.  Return to your normal activities as told by your health care provider. Ask your health care provider what activities are safe for you.  Do not take baths, swim, or use a hot tub until your health care provider approves.  Wear compression stockings as told by your health care provider. These stockings help to prevent blood clots and reduce swelling in your legs.  Keep all follow-up visits as told by your health care provider. This is important. Contact a health care provider if:  You have more redness, swelling, or pain around your incision.  You have more fluid or blood coming from your incision.  Your incision feels warm to the touch.  You have pus or a bad smell coming from your incision.  You have a fever.  You have pain or numbness that gets worse or lasts longer than a few days. This information is not intended to replace advice given to you by your health care provider. Make sure you discuss any questions you have with your health care provider. Document Released: 10/20/2015 Document Revised: 02/29/2016 Document Reviewed: 01/18/2015 Elsevier Interactive Patient Education  [employer].

## 2018-04-06 LAB — SURGICAL PATHOLOGY

## 2018-04-07 NOTE — Progress Notes (Signed)
David Rich  [phone] [phone]) [phone]  ID: David Rich OB: 05/30/1946  MR#: [id number]  HGD#:[id number]  Patient Care Team: Juluis Pitch, MD as PCP - General (Family Medicine)  CHIEF COMPLAINT: Stage IVa urothelial carcinoma with left supraclavicular lymph node metastasis.  INTERVAL HISTORY: Patient returns to clinic today for discussion of his pathology results and initiation of cycle 1, day 1 of cisplatin and gemcitabine.  He is anxious, but otherwise feels well. He has no neurologic complaints.  He denies any recent fevers or illnesses.  He has a good appetite and denies weight loss.  He has no chest pain or shortness of breath.  He denies any nausea, vomiting, constipation, or diarrhea.  He has no urinary complaints.  Patient feels at his baseline offers no further specific complaints today.  REVIEW OF SYSTEMS:   Review of Systems  Constitutional: Negative.  Negative for fever, malaise/fatigue and weight loss.  Respiratory: Negative.  Negative for cough and shortness of breath.   Cardiovascular: Negative.  Negative for chest pain and leg swelling.  Gastrointestinal: Negative.  Negative for abdominal pain.  Genitourinary: Negative.  Negative for dysuria and hematuria.  Musculoskeletal: Negative.  Negative for back pain.  Skin: Negative.  Negative for rash.  Neurological: Negative.  Negative for sensory change, focal weakness and weakness.  Psychiatric/Behavioral: The patient is nervous/anxious.     As per HPI. Otherwise, a complete review of systems is negative.  PAST MEDICAL HISTORY: Past Medical History:  Diagnosis Date  . Hyperlipidemia   . Hypertension   . Renal disorder     PAST SURGICAL HISTORY: Past Surgical History:  Procedure Laterality Date  . CORONARY STENT PLACEMENT    . IR FLUORO GUIDE CV LINE RIGHT  04/03/2018  . kidney removed Left 2013    FAMILY HISTORY: Family History  Problem Relation Age of Onset  .  Coronary artery disease Mother   . Anemia Mother   . Kidney failure Mother   . Subarachnoid hemorrhage Father   . Breast cancer Sister 19  . Hypertension Brother     ADVANCED DIRECTIVES (Y/N):  N  HEALTH MAINTENANCE: Social History   Tobacco Use  . Smoking status: Former Smoker    Packs/day: 1.00    Years: 55.00    Pack years: 55.00    Types: Cigarettes    Last attempt to quit: 03/20/2012    Years since quitting: 6.0  . Smokeless tobacco: Never Used  Substance Use Topics  . Alcohol use: Yes    Comment: Social  . Drug use: No     Colonoscopy:  PAP:  Bone density:  Lipid panel:  Allergies  Allergen Reactions  . Clopidogrel Rash and Hives  . Rosuvastatin Calcium Anaphylaxis  . Etodolac Nausea And Vomiting and Other (See Comments)    Bad dreams Bad dreams   . Hydrocodone Other (See Comments)    Bad dreams Bad dreams   . Other   . Statins   . Meloxicam Rash    Bad dreams Bad dreams   . Pravastatin Sodium Rash    Current Outpatient Medications  Medication Sig Dispense Refill  . amLODipine (NORVASC) 2.5 MG tablet Take 1 tablet (2.5 mg total) by mouth daily. 7 tablet 0  . aspirin EC 81 MG tablet Take by mouth.    . Garlic 962 MG TABS Take by mouth.     . lidocaine-prilocaine (EMLA) cream Apply to affected area once 30 g 3  . metoprolol tartrate (LOPRESSOR) 25 MG  tablet Take 25 mg by mouth.     . Multiple Vitamin (MULTI-VITAMINS) TABS Take by mouth.    . Omega-3 Fatty Acids (FISH OIL OMEGA-3) 1000 MG CAPS Take by mouth daily.    [name] acetaminophen (TYLENOL) 325 MG tablet Take by mouth.    . ondansetron (ZOFRAN) 8 MG tablet Take 1 tablet (8 mg total) by mouth 2 (two) times daily as needed. (Patient not taking: Reported on 04/08/2018) 30 tablet 2  . prochlorperazine (COMPAZINE) 10 MG tablet Take 1 tablet (10 mg total) by mouth every 6 (six) hours as needed (Nausea or vomiting). (Patient not taking: Reported on 04/08/2018) 60 tablet 2   No current facility-administered  medications for this visit.     OBJECTIVE: Vitals:   04/08/18 0837  BP: (!) 148/81  Pulse: 69  Resp: 18  Temp: (!) 95.9 F (35.5 C)     Body mass index is 26.97 kg/m.    ECOG FS:0 - Asymptomatic  General: Well-developed, well-nourished, no acute distress. Eyes: Pink conjunctiva, anicteric sclera. Lungs: Clear to auscultation bilaterally. Heart: Regular rate and rhythm. No rubs, murmurs, or gallops. Abdomen: Soft, nontender, nondistended. No organomegaly noted, normoactive bowel sounds. Musculoskeletal: No edema, cyanosis, or clubbing. Neuro: Alert, answering all questions appropriately. Cranial nerves grossly intact. Skin: No rashes or petechiae noted. Psych: Normal affect. Lymphatics: Left supraclavicular incision at site of biopsy well-healing.   LAB RESULTS:  Lab Results  Component Value Date   NA 138 04/08/2018   K 3.8 04/08/2018   CL 107 04/08/2018   CO2 22 04/08/2018   GLUCOSE 144 (H) 04/08/2018   BUN 15 04/08/2018   CREATININE 1.24 04/08/2018   CALCIUM 8.9 04/08/2018   PROT 7.1 04/08/2018   ALBUMIN 4.0 04/08/2018   AST 21 04/08/2018   ALT 17 04/08/2018   ALKPHOS 77 04/08/2018   BILITOT 0.6 04/08/2018   GFRNONAA 58 (L) 04/08/2018   GFRAA >60 04/08/2018    Lab Results  Component Value Date   WBC 8.8 04/08/2018   NEUTROABS 5.1 04/08/2018   HGB 14.7 04/08/2018   HCT 42.6 04/08/2018   MCV 94.5 04/08/2018   PLT 257 04/08/2018     STUDIES: Nm Pet Image Initial (pi) Skull Base To Thigh  Result Date: 03/25/2018 CLINICAL DATA:  Initial treatment strategy for bladder cancer. EXAM: NUCLEAR MEDICINE PET SKULL BASE TO THIGH TECHNIQUE: 10.41 mCi F-18 FDG was injected intravenously. Full-ring PET imaging was performed from the skull base to thigh after the radiotracer. CT data was obtained and used for attenuation correction and anatomic localization. Fasting blood glucose: 95 mg/dl COMPARISON:  None. FINDINGS: Mediastinal blood pool activity: SUV max 2.05 NECK:  No definite neck mass or cervical lymphadenopathy. There is an enlarged and hypermetabolic supraclavicular node on the left side this measures 11 mm on image number 66 and has an SUV max of 9.67. Incidental CT findings: none CHEST: No axillary lymphadenopathy. No enlarged or hypermetabolic mediastinal or hilar lymph nodes. Small right-sided retrocrural lymph nodes with hypermetabolism and SUV max of 5.27. No worrisome pulmonary lesions to suggest pulmonary metastatic disease or primary lung neoplasm. There are emphysematous changes and pulmonary scarring. Incidental CT findings: none ABDOMEN/PELVIS: 18 mm left para-aortic retroperitoneal lymph node on image number [id number] is hypermetabolic with SUV max of 62.0. Multiple other smaller left-sided retroperitoneal lymph nodes are noted. 10 mm left common iliac artery lymph node on image number 218 has an SUV max of 10.9. 6 mm right pelvic sidewall lymph node on image number 248  has an SUV max of 4.35. No inguinal adenopathy Incidental CT findings: Slightly irregular right-sided bladder wall thickening without obvious mass. Penile prosthesis is noted. Extensive colonic diverticulosis without findings for acute diverticulitis. Status post right nephrectomy. SKELETON: No focal hypermetabolic activity to suggest skeletal metastasis. Incidental CT findings: none IMPRESSION: 1. Left supraclavicular, right retrocrural, retroperitoneal and pelvic metastatic adenopathy. 2. No solid organ metastasis, pulmonary metastasis or osseous metastasis. Electronically Signed   By: Marijo Sanes M.D.   On: 03/25/2018 14:04   Ir Fluoro Guide Cv Line Right  Result Date: 04/03/2018 INDICATION: History of metastatic bladder cancer, in need of durable intravenous access for chemotherapy administration. EXAM: IMPLANTED PORT A CATH PLACEMENT WITH ULTRASOUND AND FLUOROSCOPIC GUIDANCE COMPARISON:  None. MEDICATIONS: Ancef 2 gm IV; The antibiotic was administered within an appropriate time interval  prior to skin puncture. ANESTHESIA/SEDATION: Moderate (conscious) sedation was employed during this procedure. A total of Versed 1 mg and Fentanyl 50 mcg was administered intravenously. Moderate Sedation Time: 28 minutes. The patient's level of consciousness and vital signs were monitored continuously by radiology nursing throughout the procedure under my direct supervision. CONTRAST:  None FLUOROSCOPY TIME:  12 seconds (9 mGy) COMPLICATIONS: None immediate. PROCEDURE: The procedure, risks, benefits, and alternatives were explained to the patient. Questions regarding the procedure were encouraged and answered. The patient understands and consents to the procedure. The right neck and chest were prepped with chlorhexidine in a sterile fashion, and a sterile drape was applied covering the operative field. Maximum barrier sterile technique with sterile gowns and gloves were used for the procedure. A timeout was performed prior to the initiation of the procedure. Local anesthesia was provided with 1% lidocaine with epinephrine. After creating a small venotomy incision, a micropuncture kit was utilized to access the internal jugular vein. Real-time ultrasound guidance was utilized for vascular access including the acquisition of a permanent ultrasound image documenting patency of the accessed vessel. The microwire was utilized to measure appropriate catheter length. A subcutaneous port pocket was then created along the upper chest wall utilizing a combination of sharp and blunt dissection. The pocket was irrigated with sterile saline. A single lumen power injectable port was chosen for placement. The 8 Fr catheter was tunneled from the port pocket site to the venotomy incision. The port was placed in the pocket. The external catheter was trimmed to appropriate length. At the venotomy, an 8 Fr peel-away sheath was placed over a guidewire under fluoroscopic guidance. The catheter was then placed through the sheath and the  sheath was removed. Final catheter positioning was confirmed and documented with a fluoroscopic spot radiograph. The port was accessed with a Huber needle, aspirated and flushed with heparinized saline. The venotomy site was closed with an interrupted 4-0 Vicryl suture. The port pocket incision was closed with interrupted 2-0 Vicryl suture and the skin was opposed with a running subcuticular 4-0 Vicryl suture. Dermabond and Steri-strips were applied to both incisions. Dressings were placed. The patient tolerated the procedure well without immediate post procedural complication. FINDINGS: After catheter placement, the tip lies within the superior cavoatrial junction. The catheter aspirates and flushes normally and is ready for immediate use. IMPRESSION: Successful placement of a right internal jugular approach power injectable Port-A-Cath. The catheter is ready for immediate use. Electronically Signed   By: Sandi Mariscal M.D.   On: 04/03/2018 12:38   [place] Core Biopsy (lymph Nodes)  Result Date: 04/03/2018 INDICATION: Concern for metastatic bladder cancer, now with hypermetabolic left supraclavicular lymph node. Please  perform ultrasound-guided biopsy for tissue diagnostic purposes. EXAM: ULTRASOUND-GUIDED LEFT SUPRACLAVICULAR LYMPH NODE BIOPSY COMPARISON:  PET-CT - 03/25/2018 MEDICATIONS: None ANESTHESIA/SEDATION: Moderate (conscious) sedation was employed during this procedure. A total of Versed 1 mg and Fentanyl 50 mcg was administered intravenously. Moderate Sedation Time: 11 minutes. The patient's level of consciousness and vital signs were monitored continuously by radiology nursing throughout the procedure under my direct supervision. COMPLICATIONS: None immediate. TECHNIQUE: Informed written consent was obtained from the patient after a discussion of the risks, benefits and alternatives to treatment. Questions regarding the procedure were encouraged and answered. Initial ultrasound scanning demonstrated an  approximately 1.3 x 1.0 x 0.8 cm lymph node within medial aspect the left supraclavicular fossa (representative images 2 through 6) correlating with the hypermetabolic left supraclavicular lymph node seen on preceding PET-CT image 66, series 3. An ultrasound image was saved for documentation purposes. The procedure was planned. A timeout was performed prior to the initiation of the procedure. The operative was prepped and draped in the usual sterile fashion, and a sterile drape was applied covering the operative field. A timeout was performed prior to the initiation of the procedure. Local anesthesia was provided with 1% lidocaine with epinephrine. Under direct ultrasound guidance, an 18 gauge core needle device was utilized to obtain to obtain 6 core needle biopsies of the hypermetabolic left supraclavicular lymph node. The samples were placed in saline and submitted to pathology. The needle was removed and hemostasis was achieved with manual compression. Post procedure scan was negative for significant hematoma. A dressing was placed. The patient tolerated the procedure well without immediate postprocedural complication. IMPRESSION: Technically successful ultrasound guided biopsy of hypermetabolic left supraclavicular lymph node. Electronically Signed   By: Sandi Mariscal M.D.   On: 04/03/2018 12:41    ASSESSMENT: Stage IVa urothelial carcinoma with left supraclavicular lymph node metastasis.  PLAN:    1. 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  PET scan results from March 25, 2018 reviewed independently report as above confirming stage.  Patient will require systemic chemotherapy using cisplatin and gemcitabine.  Because patient has had previous nephrectomy, will use a split dose of cisplatin.  Patient will receive gemcitabine and cisplatin on day 1 and 8 and have day 15 off.  This will be a 21-day  cycle.  Continue to monitor kidney function closely throughout treatment.  Plan to do 4 cycles then reimage.  Patient has now had port placement.  Proceed with cycle 1, day 1 of cisplatin and gemcitabine.  Return to clinic in 1 week for further evaluation and consideration of cycle 1, day 8.   2.  Genetic testing: Patient qualifies for genetic testing and a referral will be made at next clinic appointment. 3.  Kidney function: Patient's creatinine is within normal limits at 1.24 today.  Monitor closely given his history of nephrectomy and using cisplatin.   Patient expressed understanding and was in agreement with this plan. He also understands that He can call clinic at any time with any questions, concerns, or complaints.   Cancer Staging Urothelial carcinoma of bladder [facility]) Staging form: Urinary Bladder, AJCC 8th Edition - Clinical: Stage IVA [place], cN2, cM1a) - Signed by [name], MD on 04/09/2018   [name], MD   04/09/2018 8:27 AM

## 2018-04-08 ENCOUNTER — Inpatient Hospital Stay: Payer: Medicare Other

## 2018-04-08 ENCOUNTER — Inpatient Hospital Stay: Payer: Medicare Other | Attending: Oncology

## 2018-04-08 ENCOUNTER — Telehealth: Payer: Self-pay | Admitting: *Deleted

## 2018-04-08 ENCOUNTER — Inpatient Hospital Stay (HOSPITAL_BASED_OUTPATIENT_CLINIC_OR_DEPARTMENT_OTHER): Payer: Medicare Other | Admitting: Oncology

## 2018-04-08 ENCOUNTER — Other Ambulatory Visit: Payer: Self-pay

## 2018-04-08 VITALS — BP 148/81 | HR 69 | Temp 95.9°F | Resp 18 | Wt 193.4 lb

## 2018-04-08 DIAGNOSIS — Z5111 Encounter for antineoplastic chemotherapy: Secondary | ICD-10-CM | POA: Diagnosis not present

## 2018-04-08 DIAGNOSIS — Z87891 Personal history of nicotine dependence: Secondary | ICD-10-CM | POA: Diagnosis not present

## 2018-04-08 DIAGNOSIS — Z905 Acquired absence of kidney: Secondary | ICD-10-CM | POA: Diagnosis not present

## 2018-04-08 DIAGNOSIS — C770 Secondary and unspecified malignant neoplasm of lymph nodes of head, face and neck: Secondary | ICD-10-CM

## 2018-04-08 DIAGNOSIS — C679 Malignant neoplasm of bladder, unspecified: Secondary | ICD-10-CM

## 2018-04-08 DIAGNOSIS — Z803 Family history of malignant neoplasm of breast: Secondary | ICD-10-CM | POA: Diagnosis not present

## 2018-04-08 LAB — CBC WITH DIFFERENTIAL/PLATELET
Basophils Absolute: 0.1 10*3/uL (ref 0–0.1)
Basophils Relative: 1 %
EOS ABS: 0.4 10*3/uL (ref 0–0.7)
EOS PCT: 4 %
HCT: 42.6 % (ref 40.0–52.0)
HEMOGLOBIN: 14.7 g/dL (ref 13.0–18.0)
LYMPHS ABS: 2.5 10*3/uL (ref 1.0–3.6)
Lymphocytes Relative: 28 %
MCH: 32.7 pg (ref 26.0–34.0)
MCHC: 34.6 g/dL (ref 32.0–36.0)
MCV: 94.5 fL (ref 80.0–100.0)
MONO ABS: 0.8 10*3/uL (ref 0.2–1.0)
Monocytes Relative: 9 %
Neutro Abs: 5.1 10*3/uL (ref 1.4–6.5)
Neutrophils Relative %: 58 %
Platelets: 257 10*3/uL (ref 150–440)
RBC: 4.5 MIL/uL (ref 4.40–5.90)
RDW: 14.1 % (ref 11.5–14.5)
WBC: 8.8 10*3/uL (ref 3.8–10.6)

## 2018-04-08 LAB — COMPREHENSIVE METABOLIC PANEL
ALT: 17 U/L (ref 0–44)
AST: 21 U/L (ref 15–41)
Albumin: 4 g/dL (ref 3.5–5.0)
Alkaline Phosphatase: 77 U/L (ref 38–126)
Anion gap: 9 (ref 5–15)
BILIRUBIN TOTAL: 0.6 mg/dL (ref 0.3–1.2)
BUN: 15 mg/dL (ref 8–23)
CHLORIDE: 107 mmol/L (ref 98–111)
CO2: 22 mmol/L (ref 22–32)
CREATININE: 1.24 mg/dL (ref 0.61–1.24)
Calcium: 8.9 mg/dL (ref 8.9–10.3)
GFR calc Af Amer: >60 mL/min (ref >60)
GFR, EST NON AFRICAN AMERICAN: 58 mL/min — AB (ref >60)
Glucose, Bld: 144 mg/dL — ABNORMAL HIGH (ref 70–99)
Potassium: 3.8 mmol/L (ref 3.5–5.1)
Sodium: 138 mmol/L (ref 135–145)
Total Protein: 7.1 g/dL (ref 6.5–8.1)

## 2018-04-08 MED ORDER — SODIUM CHLORIDE 0.9 % IV SOLN
35.0000 mg/m2 | Freq: Once | INTRAVENOUS | Status: AC
  Administered 2018-04-08: 74 mg via INTRAVENOUS
  Filled 2018-04-08: qty 74

## 2018-04-08 MED ORDER — POTASSIUM CHLORIDE 2 MEQ/ML IV SOLN
Freq: Once | INTRAVENOUS | Status: AC
  Administered 2018-04-08: 10:00:00 via INTRAVENOUS
  Filled 2018-04-08: qty 1000

## 2018-04-08 MED ORDER — SODIUM CHLORIDE 0.9 % IV SOLN
2200.0000 mg | Freq: Once | INTRAVENOUS | Status: AC
  Administered 2018-04-08: 2200 mg via INTRAVENOUS
  Filled 2018-04-08: qty 52.6

## 2018-04-08 MED ORDER — SODIUM CHLORIDE 0.9 % IV SOLN
Freq: Once | INTRAVENOUS | Status: AC
  Administered 2018-04-08: 10:00:00 via INTRAVENOUS
  Filled 2018-04-08: qty 1000

## 2018-04-08 MED ORDER — FOSAPREPITANT DIMEGLUMINE INJECTION 150 MG
Freq: Once | INTRAVENOUS | Status: AC
  Administered 2018-04-08: 12:00:00 via INTRAVENOUS
  Filled 2018-04-08: qty 5

## 2018-04-08 MED ORDER — HEPARIN SOD (PORK) LOCK FLUSH 100 UNIT/ML IV SOLN
500.0000 [IU] | Freq: Once | INTRAVENOUS | Status: AC
  Administered 2018-04-08: 500 [IU] via INTRAVENOUS
  Filled 2018-04-08: qty 5

## 2018-04-08 MED ORDER — PALONOSETRON HCL INJECTION 0.25 MG/5ML
0.2500 mg | Freq: Once | INTRAVENOUS | Status: AC
  Administered 2018-04-08: 0.25 mg via INTRAVENOUS
  Filled 2018-04-08: qty 5

## 2018-04-08 MED ORDER — SODIUM CHLORIDE 0.9% FLUSH
10.0000 mL | Freq: Once | INTRAVENOUS | Status: AC
  Administered 2018-04-08: 10 mL via INTRAVENOUS
  Filled 2018-04-08: qty 10

## 2018-04-08 NOTE — Progress Notes (Signed)
Here for follow up. Doing ok per pt.

## 2018-04-08 NOTE — Telephone Encounter (Signed)
Entered in error

## 2018-04-12 NOTE — Progress Notes (Signed)
[place]  [phone] [phone]  ID: David Rich OB: Mar 07, 1946  MR#: [id number]  WUG#:[id number]  Patient Care Team: Juluis Pitch, MD as PCP - General (Family Medicine)  CHIEF COMPLAINT: Stage IVa urothelial carcinoma with left supraclavicular lymph node metastasis.  INTERVAL HISTORY: Patient returns to clinic today for further evaluation and consideration of cycle 1, day 8 of cisplatin and gemcitabine.  He tolerated his first infusion well without significant side effects.  He currently feels well and is asymptomatic. He has no neurologic complaints.  He denies any recent fevers or illnesses.  He has a good appetite and denies weight loss.  He has no chest pain or shortness of breath.  He denies any nausea, vomiting, constipation, or diarrhea.  He has no urinary complaints.  Patient offers no specific complaints today.  REVIEW OF SYSTEMS:   Review of Systems  Constitutional: Negative.  Negative for fever, malaise/fatigue and weight loss.  Respiratory: Negative.  Negative for cough and shortness of breath.   Cardiovascular: Negative.  Negative for chest pain and leg swelling.  Gastrointestinal: Negative.  Negative for abdominal pain.  Genitourinary: Negative.  Negative for dysuria and hematuria.  Musculoskeletal: Negative.  Negative for back pain.  Skin: Negative.  Negative for rash.  Neurological: Negative.  Negative for sensory change, focal weakness and weakness.  Psychiatric/Behavioral: The patient is nervous/anxious.     As per HPI. Otherwise, a complete review of systems is negative.  PAST MEDICAL HISTORY: Past Medical History:  Diagnosis Date  . Hyperlipidemia   . Hypertension   . Renal disorder     PAST SURGICAL HISTORY: Past Surgical History:  Procedure Laterality Date  . CORONARY STENT PLACEMENT    . IR FLUORO GUIDE CV LINE RIGHT  04/03/2018  . kidney removed Left 2013    FAMILY HISTORY: Family History  Problem  Relation Age of Onset  . Coronary artery disease Mother   . Anemia Mother   . Kidney failure Mother   . Subarachnoid hemorrhage Father   . Breast cancer Sister 62  . Hypertension Brother     ADVANCED DIRECTIVES (Y/N):  N  HEALTH MAINTENANCE: Social History   Tobacco Use  . Smoking status: Former Smoker    Packs/day: 1.00    Years: 55.00    Pack years: 55.00    Types: Cigarettes    Last attempt to quit: 03/20/2012    Years since quitting: 6.0  . Smokeless tobacco: Never Used  Substance Use Topics  . Alcohol use: Yes    Comment: Social  . Drug use: No     Colonoscopy:  PAP:  Bone density:  Lipid panel:  Allergies  Allergen Reactions  . Clopidogrel Rash and Hives  . Rosuvastatin Calcium Anaphylaxis  . Etodolac Nausea And Vomiting and Other (See Comments)    Bad dreams Bad dreams   . Hydrocodone Other (See Comments)    Bad dreams Bad dreams   . Other   . Statins   . Meloxicam Rash    Bad dreams Bad dreams   . Pravastatin Sodium Rash    Current Outpatient Medications  Medication Sig Dispense Refill  . acetaminophen (TYLENOL) 325 MG tablet Take by mouth.    [name] amLODipine (NORVASC) 2.5 MG tablet Take 1 tablet (2.5 mg total) by mouth daily. 7 tablet 0  . aspirin EC 81 MG tablet Take by mouth.    . Garlic 888 MG TABS Take by mouth.     . lidocaine-prilocaine (EMLA) cream  Apply to affected area once 30 g 3  . metoprolol tartrate (LOPRESSOR) 25 MG tablet Take 25 mg by mouth.     . Multiple Vitamin (MULTI-VITAMINS) TABS Take by mouth.    . Omega-3 Fatty Acids (FISH OIL OMEGA-3) 1000 MG CAPS Take by mouth daily.    . ondansetron (ZOFRAN) 8 MG tablet Take 1 tablet (8 mg total) by mouth 2 (two) times daily as needed. 30 tablet 2  . prochlorperazine (COMPAZINE) 10 MG tablet Take 1 tablet (10 mg total) by mouth every 6 (six) hours as needed (Nausea or vomiting). 60 tablet 2  . omeprazole (PRILOSEC) 20 MG capsule Take 1 capsule (20 mg total) by mouth daily. 30 capsule 3    No current facility-administered medications for this visit.     OBJECTIVE: Vitals:   04/15/18 0845  BP: 134/78  Pulse: 65  Resp: 18  Temp: (!) 96.5 F (35.8 C)     Body mass index is 27.12 kg/m.    ECOG FS:0 - Asymptomatic  General: Well-developed, well-nourished, no acute distress. Eyes: Pink conjunctiva, anicteric sclera. Lungs: Clear to auscultation bilaterally. Heart: Regular rate and rhythm. No rubs, murmurs, or gallops. Abdomen: Soft, nontender, nondistended. No organomegaly noted, normoactive bowel sounds. Musculoskeletal: No edema, cyanosis, or clubbing. Neuro: Alert, answering all questions appropriately. Cranial nerves grossly intact. Skin: No rashes or petechiae noted. Psych: Normal affect.   LAB RESULTS:  Lab Results  Component Value Date   NA 137 04/15/2018   K 3.7 04/15/2018   CL 106 04/15/2018   CO2 22 04/15/2018   GLUCOSE 116 (H) 04/15/2018   BUN 21 04/15/2018   CREATININE 1.15 04/15/2018   CALCIUM 8.9 04/15/2018   PROT 7.0 04/15/2018   ALBUMIN 3.8 04/15/2018   AST 20 04/15/2018   ALT 21 04/15/2018   ALKPHOS 70 04/15/2018   BILITOT 0.7 04/15/2018   GFRNONAA >60 04/15/2018   GFRAA >60 04/15/2018    Lab Results  Component Value Date   WBC 3.5 (L) 04/15/2018   NEUTROABS 1.3 (L) 04/15/2018   HGB 14.0 04/15/2018   HCT 39.8 (L) 04/15/2018   MCV 94.4 04/15/2018   PLT 181 04/15/2018     STUDIES: Nm Pet Image Initial (pi) Skull Base To Thigh  Result Date: 03/25/2018 CLINICAL DATA:  Initial treatment strategy for bladder cancer. EXAM: NUCLEAR MEDICINE PET SKULL BASE TO THIGH TECHNIQUE: 10.41 mCi F-18 FDG was injected intravenously. Full-ring PET imaging was performed from the skull base to thigh after the radiotracer. CT data was obtained and used for attenuation correction and anatomic localization. Fasting blood glucose: 95 mg/dl COMPARISON:  None. FINDINGS: Mediastinal blood pool activity: SUV max 2.05 NECK: No definite neck mass or cervical  lymphadenopathy. There is an enlarged and hypermetabolic supraclavicular node on the left side this measures 11 mm on image number 66 and has an SUV max of 9.67. Incidental CT findings: none CHEST: No axillary lymphadenopathy. No enlarged or hypermetabolic mediastinal or hilar lymph nodes. Small right-sided retrocrural lymph nodes with hypermetabolism and SUV max of 5.27. No worrisome pulmonary lesions to suggest pulmonary metastatic disease or primary lung neoplasm. There are emphysematous changes and pulmonary scarring. Incidental CT findings: none ABDOMEN/PELVIS: 18 mm left para-aortic retroperitoneal lymph node on image number 017 is hypermetabolic with SUV max of 79.3. Multiple other smaller left-sided retroperitoneal lymph nodes are noted. 10 mm left common iliac artery lymph node on image number 218 has an SUV max of 10.9. 6 mm right pelvic sidewall lymph node on image  number 248 has an SUV max of 4.35. No inguinal adenopathy Incidental CT findings: Slightly irregular right-sided bladder wall thickening without obvious mass. Penile prosthesis is noted. Extensive colonic diverticulosis without findings for acute diverticulitis. Status post right nephrectomy. SKELETON: No focal hypermetabolic activity to suggest skeletal metastasis. Incidental CT findings: none IMPRESSION: 1. Left supraclavicular, right retrocrural, retroperitoneal and pelvic metastatic adenopathy. 2. No solid organ metastasis, pulmonary metastasis or osseous metastasis. Electronically Signed   By: Marijo Sanes M.D.   On: 03/25/2018 14:04   Ir Fluoro Guide Cv Line Right  Result Date: 04/03/2018 INDICATION: History of metastatic bladder cancer, in need of durable intravenous access for chemotherapy administration. EXAM: IMPLANTED PORT A CATH PLACEMENT WITH ULTRASOUND AND FLUOROSCOPIC GUIDANCE COMPARISON:  None. MEDICATIONS: Ancef 2 gm IV; The antibiotic was administered within an appropriate time interval prior to skin puncture.  ANESTHESIA/SEDATION: Moderate (conscious) sedation was employed during this procedure. A total of Versed 1 mg and Fentanyl 50 mcg was administered intravenously. Moderate Sedation Time: 28 minutes. The patient's level of consciousness and vital signs were monitored continuously by radiology nursing throughout the procedure under my direct supervision. CONTRAST:  None FLUOROSCOPY TIME:  12 seconds (9 mGy) COMPLICATIONS: None immediate. PROCEDURE: The procedure, risks, benefits, and alternatives were explained to the patient. Questions regarding the procedure were encouraged and answered. The patient understands and consents to the procedure. The right neck and chest were prepped with chlorhexidine in a sterile fashion, and a sterile drape was applied covering the operative field. Maximum barrier sterile technique with sterile gowns and gloves were used for the procedure. A timeout was performed prior to the initiation of the procedure. Local anesthesia was provided with 1% lidocaine with epinephrine. After creating a small venotomy incision, a micropuncture kit was utilized to access the internal jugular vein. Real-time ultrasound guidance was utilized for vascular access including the acquisition of a permanent ultrasound image documenting patency of the accessed vessel. The microwire was utilized to measure appropriate catheter length. A subcutaneous port pocket was then created along the upper chest wall utilizing a combination of sharp and blunt dissection. The pocket was irrigated with sterile saline. A single lumen power injectable port was chosen for placement. The 8 Fr catheter was tunneled from the port pocket site to the venotomy incision. The port was placed in the pocket. The external catheter was trimmed to appropriate length. At the venotomy, an 8 Fr peel-away sheath was placed over a guidewire under fluoroscopic guidance. The catheter was then placed through the sheath and the sheath was removed. Final  catheter positioning was confirmed and documented with a fluoroscopic spot radiograph. The port was accessed with a Huber needle, aspirated and flushed with heparinized saline. The venotomy site was closed with an interrupted 4-0 Vicryl suture. The port pocket incision was closed with interrupted 2-0 Vicryl suture and the skin was opposed with a running subcuticular 4-0 Vicryl suture. Dermabond and Steri-strips were applied to both incisions. Dressings were placed. The patient tolerated the procedure well without immediate post procedural complication. FINDINGS: After catheter placement, the tip lies within the superior cavoatrial junction. The catheter aspirates and flushes normally and is ready for immediate use. IMPRESSION: Successful placement of a right internal jugular approach power injectable Port-A-Cath. The catheter is ready for immediate use. Electronically Signed   By: Sandi Mariscal M.D.   On: 04/03/2018 12:38   [place] Core Biopsy (lymph Nodes)  Result Date: 04/03/2018 INDICATION: Concern for metastatic bladder cancer, now with hypermetabolic left supraclavicular lymph  node. Please perform ultrasound-guided biopsy for tissue diagnostic purposes. EXAM: ULTRASOUND-GUIDED LEFT SUPRACLAVICULAR LYMPH NODE BIOPSY COMPARISON:  PET-CT - 03/25/2018 MEDICATIONS: None ANESTHESIA/SEDATION: Moderate (conscious) sedation was employed during this procedure. A total of Versed 1 mg and Fentanyl 50 mcg was administered intravenously. Moderate Sedation Time: 11 minutes. The patient's level of consciousness and vital signs were monitored continuously by radiology nursing throughout the procedure under my direct supervision. COMPLICATIONS: None immediate. TECHNIQUE: Informed written consent was obtained from the patient after a discussion of the risks, benefits and alternatives to treatment. Questions regarding the procedure were encouraged and answered. Initial ultrasound scanning demonstrated an approximately 1.3 x 1.0 x 0.8  cm lymph node within medial aspect the left supraclavicular fossa (representative images 2 through 6) correlating with the hypermetabolic left supraclavicular lymph node seen on preceding PET-CT image 66, series 3. An ultrasound image was saved for documentation purposes. The procedure was planned. A timeout was performed prior to the initiation of the procedure. The operative was prepped and draped in the usual sterile fashion, and a sterile drape was applied covering the operative field. A timeout was performed prior to the initiation of the procedure. Local anesthesia was provided with 1% lidocaine with epinephrine. Under direct ultrasound guidance, an 18 gauge core needle device was utilized to obtain to obtain 6 core needle biopsies of the hypermetabolic left supraclavicular lymph node. The samples were placed in saline and submitted to pathology. The needle was removed and hemostasis was achieved with manual compression. Post procedure scan was negative for significant hematoma. A dressing was placed. The patient tolerated the procedure well without immediate postprocedural complication. IMPRESSION: Technically successful ultrasound guided biopsy of hypermetabolic left supraclavicular lymph node. Electronically Signed   By: Sandi Mariscal M.D.   On: 04/03/2018 12:41    ASSESSMENT: Stage IVa urothelial carcinoma with left supraclavicular lymph node metastasis.  PLAN:    1. 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  PET scan results from March 25, 2018 reviewed independently reported as above confirming stage. Patient will receive gemcitabine and cisplatin on day 1 and 8 and have day 15 off.  Because patient has had previous nephrectomy, will use a split dose of cisplatin.  This will be a 21-day cycle.  Continue to monitor kidney function closely throughout treatment.  Plan to do 4 cycles then reimage.   Proceed with cycle 1, day 8 of cisplatin and gemcitabine.  Patient will return to clinic in 2 weeks for further evaluation and consideration of cycle 2, day 1.  2.  Genetic testing: Patient qualifies for genetic testing and a referral will be made at next clinic appointment. 3.  Kidney function: Patient creatinine is 1.15 today.  Monitor closely given his history of nephrectomy and using cisplatin. 4.  Leukopenia: Mild, monitor.   Patient expressed understanding and was in agreement with this plan. He also understands that He can call clinic at any time with any questions, concerns, or complaints.   Cancer Staging Urothelial carcinoma of bladder [facility]) Staging form: Urinary Bladder, AJCC 8th Edition - Clinical: Stage IVA [place], cN2, cM1a) - Signed by [name], MD on 04/09/2018   [name], MD   04/17/2018 1:45 PM

## 2018-04-15 ENCOUNTER — Encounter: Payer: Self-pay | Admitting: Oncology

## 2018-04-15 ENCOUNTER — Inpatient Hospital Stay: Payer: Medicare Other

## 2018-04-15 ENCOUNTER — Inpatient Hospital Stay (HOSPITAL_BASED_OUTPATIENT_CLINIC_OR_DEPARTMENT_OTHER): Payer: Medicare Other | Admitting: Oncology

## 2018-04-15 VITALS — BP 134/78 | HR 65 | Temp 96.5°F | Resp 18 | Wt 194.4 lb

## 2018-04-15 DIAGNOSIS — C679 Malignant neoplasm of bladder, unspecified: Secondary | ICD-10-CM | POA: Diagnosis not present

## 2018-04-15 DIAGNOSIS — I10 Essential (primary) hypertension: Secondary | ICD-10-CM | POA: Diagnosis not present

## 2018-04-15 DIAGNOSIS — Z803 Family history of malignant neoplasm of breast: Secondary | ICD-10-CM

## 2018-04-15 DIAGNOSIS — D72819 Decreased white blood cell count, unspecified: Secondary | ICD-10-CM

## 2018-04-15 DIAGNOSIS — C770 Secondary and unspecified malignant neoplasm of lymph nodes of head, face and neck: Secondary | ICD-10-CM

## 2018-04-15 DIAGNOSIS — Z87891 Personal history of nicotine dependence: Secondary | ICD-10-CM

## 2018-04-15 DIAGNOSIS — Z905 Acquired absence of kidney: Secondary | ICD-10-CM

## 2018-04-15 DIAGNOSIS — Z5111 Encounter for antineoplastic chemotherapy: Secondary | ICD-10-CM | POA: Diagnosis not present

## 2018-04-15 LAB — COMPREHENSIVE METABOLIC PANEL
ALT: 21 U/L (ref 0–44)
AST: 20 U/L (ref 15–41)
Albumin: 3.8 g/dL (ref 3.5–5.0)
Alkaline Phosphatase: 70 U/L (ref 38–126)
Anion gap: 9 (ref 5–15)
BILIRUBIN TOTAL: 0.7 mg/dL (ref 0.3–1.2)
BUN: 21 mg/dL (ref 8–23)
CHLORIDE: 106 mmol/L (ref 98–111)
CO2: 22 mmol/L (ref 22–32)
CREATININE: 1.15 mg/dL (ref 0.61–1.24)
Calcium: 8.9 mg/dL (ref 8.9–10.3)
GFR calc Af Amer: >60 mL/min (ref >60)
Glucose, Bld: 116 mg/dL — ABNORMAL HIGH (ref 70–99)
Potassium: 3.7 mmol/L (ref 3.5–5.1)
Sodium: 137 mmol/L (ref 135–145)
TOTAL PROTEIN: 7 g/dL (ref 6.5–8.1)

## 2018-04-15 LAB — CBC WITH DIFFERENTIAL/PLATELET
BASOS ABS: 0 10*3/uL (ref 0–0.1)
BASOS PCT: 1 %
Eosinophils Absolute: 0.2 10*3/uL (ref 0–0.7)
Eosinophils Relative: 4 %
HEMATOCRIT: 39.8 % — AB (ref 40.0–52.0)
HEMOGLOBIN: 14 g/dL (ref 13.0–18.0)
Lymphocytes Relative: 50 %
Lymphs Abs: 1.7 10*3/uL (ref 1.0–3.6)
MCH: 33.3 pg (ref 26.0–34.0)
MCHC: 35.3 g/dL (ref 32.0–36.0)
MCV: 94.4 fL (ref 80.0–100.0)
MONOS PCT: 7 %
Monocytes Absolute: 0.2 10*3/uL (ref 0.2–1.0)
NEUTROS ABS: 1.3 10*3/uL — AB (ref 1.4–6.5)
Neutrophils Relative %: 38 %
PLATELETS: 181 10*3/uL (ref 150–440)
RBC: 4.22 MIL/uL — ABNORMAL LOW (ref 4.40–5.90)
RDW: 14 % (ref 11.5–14.5)
WBC: 3.5 10*3/uL — ABNORMAL LOW (ref 3.8–10.6)

## 2018-04-15 MED ORDER — HEPARIN SOD (PORK) LOCK FLUSH 100 UNIT/ML IV SOLN
500.0000 [IU] | Freq: Once | INTRAVENOUS | Status: DC | PRN
  Filled 2018-04-15: qty 5

## 2018-04-15 MED ORDER — PALONOSETRON HCL INJECTION 0.25 MG/5ML
0.2500 mg | Freq: Once | INTRAVENOUS | Status: AC
  Administered 2018-04-15: 0.25 mg via INTRAVENOUS
  Filled 2018-04-15: qty 5

## 2018-04-15 MED ORDER — OMEPRAZOLE 20 MG PO CPDR: 20.0000 mg | DELAYED_RELEASE_CAPSULE | Freq: Every day | ORAL | 3 refills | Status: DC

## 2018-04-15 MED ORDER — SODIUM CHLORIDE 0.9 % IV SOLN
Freq: Once | INTRAVENOUS | Status: AC
  Administered 2018-04-15: 10:00:00 via INTRAVENOUS
  Filled 2018-04-15: qty 1000

## 2018-04-15 MED ORDER — SODIUM CHLORIDE 0.9 % IV SOLN
35.0000 mg/m2 | Freq: Once | INTRAVENOUS | Status: AC
  Administered 2018-04-15: 74 mg via INTRAVENOUS
  Filled 2018-04-15: qty 74

## 2018-04-15 MED ORDER — MANNITOL 25 % IV SOLN
Freq: Once | INTRAVENOUS | Status: AC
  Administered 2018-04-15: 10:00:00 via INTRAVENOUS
  Filled 2018-04-15: qty 1000

## 2018-04-15 MED ORDER — SODIUM CHLORIDE 0.9 % IV SOLN
2200.0000 mg | Freq: Once | INTRAVENOUS | Status: AC
  Administered 2018-04-15: 2200 mg via INTRAVENOUS
  Filled 2018-04-15: qty 52.6

## 2018-04-15 MED ORDER — SODIUM CHLORIDE 0.9 % IV SOLN
Freq: Once | INTRAVENOUS | Status: AC
  Administered 2018-04-15: 12:00:00 via INTRAVENOUS
  Filled 2018-04-15: qty 5

## 2018-04-15 NOTE — Progress Notes (Signed)
Pt in for follow up, reports some upset some issues and questioned what OTC med he could take.

## 2018-04-29 ENCOUNTER — Inpatient Hospital Stay: Payer: Medicare Other

## 2018-04-29 ENCOUNTER — Inpatient Hospital Stay (HOSPITAL_BASED_OUTPATIENT_CLINIC_OR_DEPARTMENT_OTHER): Payer: Medicare Other | Admitting: Oncology

## 2018-04-29 ENCOUNTER — Other Ambulatory Visit: Payer: Self-pay

## 2018-04-29 VITALS — BP 147/79 | HR 72 | Temp 96.1°F | Resp 18 | Wt 194.5 lb

## 2018-04-29 DIAGNOSIS — F419 Anxiety disorder, unspecified: Secondary | ICD-10-CM

## 2018-04-29 DIAGNOSIS — C679 Malignant neoplasm of bladder, unspecified: Secondary | ICD-10-CM

## 2018-04-29 DIAGNOSIS — Z803 Family history of malignant neoplasm of breast: Secondary | ICD-10-CM

## 2018-04-29 DIAGNOSIS — I10 Essential (primary) hypertension: Secondary | ICD-10-CM | POA: Diagnosis not present

## 2018-04-29 DIAGNOSIS — Z5111 Encounter for antineoplastic chemotherapy: Secondary | ICD-10-CM | POA: Diagnosis not present

## 2018-04-29 DIAGNOSIS — C770 Secondary and unspecified malignant neoplasm of lymph nodes of head, face and neck: Secondary | ICD-10-CM

## 2018-04-29 DIAGNOSIS — D72819 Decreased white blood cell count, unspecified: Secondary | ICD-10-CM

## 2018-04-29 DIAGNOSIS — Z87891 Personal history of nicotine dependence: Secondary | ICD-10-CM

## 2018-04-29 DIAGNOSIS — Z905 Acquired absence of kidney: Secondary | ICD-10-CM

## 2018-04-29 LAB — COMPREHENSIVE METABOLIC PANEL
ALT: 29 U/L (ref 0–44)
AST: 25 U/L (ref 15–41)
Albumin: 4 g/dL (ref 3.5–5.0)
Alkaline Phosphatase: 80 U/L (ref 38–126)
Anion gap: 9 (ref 5–15)
BILIRUBIN TOTAL: 0.4 mg/dL (ref 0.3–1.2)
BUN: 16 mg/dL (ref 8–23)
CO2: 21 mmol/L — ABNORMAL LOW (ref 22–32)
CREATININE: 1.16 mg/dL (ref 0.61–1.24)
Calcium: 8.7 mg/dL — ABNORMAL LOW (ref 8.9–10.3)
Chloride: 108 mmol/L (ref 98–111)
GFR calc Af Amer: >60 mL/min (ref >60)
Glucose, Bld: 165 mg/dL — ABNORMAL HIGH (ref 70–99)
Potassium: 3.8 mmol/L (ref 3.5–5.1)
Sodium: 138 mmol/L (ref 135–145)
TOTAL PROTEIN: 7 g/dL (ref 6.5–8.1)

## 2018-04-29 LAB — CBC WITH DIFFERENTIAL/PLATELET
BASOS ABS: 0 10*3/uL (ref 0–0.1)
Basophils Relative: 1 %
EOS ABS: 0.3 10*3/uL (ref 0–0.7)
EOS PCT: 8 %
HCT: 38.9 % — ABNORMAL LOW (ref 40.0–52.0)
Hemoglobin: 13.4 g/dL (ref 13.0–18.0)
Lymphocytes Relative: 58 %
Lymphs Abs: 2.2 10*3/uL (ref 1.0–3.6)
MCH: 32.8 pg (ref 26.0–34.0)
MCHC: 34.6 g/dL (ref 32.0–36.0)
MCV: 94.8 fL (ref 80.0–100.0)
Monocytes Absolute: 0.2 10*3/uL (ref 0.2–1.0)
Monocytes Relative: 6 %
Neutro Abs: 1 10*3/uL — ABNORMAL LOW (ref 1.4–6.5)
Neutrophils Relative %: 27 %
PLATELETS: 263 10*3/uL (ref 150–440)
RBC: 4.1 MIL/uL — AB (ref 4.40–5.90)
RDW: 14.2 % (ref 11.5–14.5)
WBC: 3.7 10*3/uL — AB (ref 3.8–10.6)

## 2018-04-29 MED ORDER — ALPRAZOLAM 0.25 MG PO TABS: 0.2500 mg | ORAL_TABLET | Freq: Three times a day (TID) | ORAL | 0 refills | Status: DC | PRN

## 2018-04-29 MED ORDER — SODIUM CHLORIDE 0.9 % IV SOLN
2200.0000 mg | Freq: Once | INTRAVENOUS | Status: AC
  Administered 2018-04-29: 2200 mg via INTRAVENOUS
  Filled 2018-04-29: qty 52.6

## 2018-04-29 MED ORDER — LORAZEPAM 2 MG/ML IJ SOLN
0.5000 mg | Freq: Once | INTRAMUSCULAR | Status: AC
  Administered 2018-04-29: 0.5 mg via INTRAVENOUS
  Filled 2018-04-29: qty 1

## 2018-04-29 MED ORDER — HEPARIN SOD (PORK) LOCK FLUSH 100 UNIT/ML IV SOLN
500.0000 [IU] | Freq: Once | INTRAVENOUS | Status: AC | PRN
  Administered 2018-04-29: 500 [IU]

## 2018-04-29 MED ORDER — POTASSIUM CHLORIDE 2 MEQ/ML IV SOLN
Freq: Once | INTRAVENOUS | Status: AC
  Administered 2018-04-29: 10:00:00 via INTRAVENOUS
  Filled 2018-04-29: qty 1000

## 2018-04-29 MED ORDER — PALONOSETRON HCL INJECTION 0.25 MG/5ML
0.2500 mg | Freq: Once | INTRAVENOUS | Status: AC
  Administered 2018-04-29: 0.25 mg via INTRAVENOUS
  Filled 2018-04-29: qty 5

## 2018-04-29 MED ORDER — SODIUM CHLORIDE 0.9 % IV SOLN
35.0000 mg/m2 | Freq: Once | INTRAVENOUS | Status: AC
  Administered 2018-04-29: 74 mg via INTRAVENOUS
  Filled 2018-04-29: qty 74

## 2018-04-29 MED ORDER — SODIUM CHLORIDE 0.9 % IV SOLN
Freq: Once | INTRAVENOUS | Status: AC
  Administered 2018-04-29: 10:00:00 via INTRAVENOUS
  Filled 2018-04-29: qty 1000

## 2018-04-29 MED ORDER — SODIUM CHLORIDE 0.9 % IV SOLN
Freq: Once | INTRAVENOUS | Status: AC
  Administered 2018-04-29: 12:00:00 via INTRAVENOUS
  Filled 2018-04-29: qty 5

## 2018-04-29 NOTE — Progress Notes (Signed)
Here for follow up. Stated " stomach queezy " not taking zofran. Stated prilosec not effective. Feeling anxious esp yesterday in anticipation.

## 2018-04-29 NOTE — Progress Notes (Signed)
[place]  Telephone:([phone] [phone]) [phone]  ID: David Rich OB: 11-06-1945  MR#: [id number]  YPP#:[id number]  Patient Care Team: Juluis Pitch, MD as PCP - General (Family Medicine)  CHIEF COMPLAINT: Stage IVa urothelial carcinoma with left supraclavicular lymph node metastasis.  INTERVAL HISTORY: Patient returns to clinic today for further evaluation and consideration of cycle 2-day 1 cisplatin and gemcitabine.  He was last seen by Dr. Grayland Ormond on 04/15/2018 for cycle 1 day 8 where he tolerated cycle 1 day 1 well without any side effects.  Today he denies any neurological complaints, fevers, illnesses, changes in appetite, weight loss, chest pain, shortness of breath, nausea, vomiting, constipation or diarrhea.  He does complain of feeling "anxious" before treatments.  States he has never been prescribed anything for anxiety but feels he may need something for future treatments.    REVIEW OF SYSTEMS:   Review of Systems  Constitutional: Negative.  Negative for chills, fever, malaise/fatigue and weight loss.  HENT: Negative for congestion and ear pain.   Eyes: Negative.  Negative for blurred vision and double vision.  Respiratory: Negative.  Negative for cough, sputum production and shortness of breath.   Cardiovascular: Negative.  Negative for chest pain, palpitations and leg swelling.  Gastrointestinal: Negative.  Negative for abdominal pain, constipation, diarrhea, nausea and vomiting.  Genitourinary: Negative for dysuria, frequency and urgency.  Musculoskeletal: Negative for back pain and falls.  Skin: Negative.  Negative for rash.  Neurological: Negative.  Negative for weakness and headaches.  Endo/Heme/Allergies: Negative.  Does not bruise/bleed easily.  Psychiatric/Behavioral: Negative for depression. The patient is nervous/anxious. The patient does not have insomnia.     As per HPI. Otherwise, a complete review of systems is  negative.  PAST MEDICAL HISTORY: Past Medical History:  Diagnosis Date  . Hyperlipidemia   . Hypertension   . Renal disorder     PAST SURGICAL HISTORY: Past Surgical History:  Procedure Laterality Date  . CORONARY STENT PLACEMENT    . IR FLUORO GUIDE CV LINE RIGHT  04/03/2018  . kidney removed Left 2013    FAMILY HISTORY: Family History  Problem Relation Age of Onset  . Coronary artery disease Mother   . Anemia Mother   . Kidney failure Mother   . Subarachnoid hemorrhage Father   . Breast cancer Sister 46  . Hypertension Brother     ADVANCED DIRECTIVES (Y/N):  N  HEALTH MAINTENANCE: Social History   Tobacco Use  . Smoking status: Former Smoker    Packs/day: 1.00    Years: 55.00    Pack years: 55.00    Types: Cigarettes    Last attempt to quit: 03/20/2012    Years since quitting: 6.1  . Smokeless tobacco: Never Used  Substance Use Topics  . Alcohol use: Yes    Comment: Social  . Drug use: No     Colonoscopy:  PAP:  Bone density:  Lipid panel:  Allergies  Allergen Reactions  . Clopidogrel Rash and Hives  . Rosuvastatin Calcium Anaphylaxis  . Etodolac Nausea And Vomiting and Other (See Comments)    Bad dreams Bad dreams   . Hydrocodone Other (See Comments)    Bad dreams Bad dreams   . Other   . Statins   . Meloxicam Rash    Bad dreams Bad dreams   . Pravastatin Sodium Rash    Current Outpatient Medications  Medication Sig Dispense Refill  . amLODipine (NORVASC) 2.5 MG tablet Take 1 tablet (2.5 mg total)  by mouth daily. 7 tablet 0  . aspirin EC 81 MG tablet Take by mouth.    . Garlic 725 MG TABS Take by mouth.     . lidocaine-prilocaine (EMLA) cream Apply to affected area once 30 g 3  . metoprolol tartrate (LOPRESSOR) 25 MG tablet Take 25 mg by mouth.     . Multiple Vitamin (MULTI-VITAMINS) TABS Take by mouth.    . Omega-3 Fatty Acids (FISH OIL OMEGA-3) 1000 MG CAPS Take by mouth daily.    [name] omeprazole (PRILOSEC) 20 MG capsule Take 1  capsule (20 mg total) by mouth daily. 30 capsule 3  . acetaminophen (TYLENOL) 325 MG tablet Take by mouth.    . ondansetron (ZOFRAN) 8 MG tablet Take 1 tablet (8 mg total) by mouth 2 (two) times daily as needed. (Patient not taking: Reported on 04/29/2018) 30 tablet 2  . prochlorperazine (COMPAZINE) 10 MG tablet Take 1 tablet (10 mg total) by mouth every 6 (six) hours as needed (Nausea or vomiting). (Patient not taking: Reported on 04/29/2018) 60 tablet 2   No current facility-administered medications for this visit.     OBJECTIVE: Vitals:   04/29/18 0843  BP: (!) 147/79  Pulse: 72  Resp: 18  Temp: (!) 96.1 F (35.6 C)     Body mass index is 27.13 kg/m.    ECOG FS:0 - Asymptomatic  Physical Exam  Constitutional: He is oriented to person, place, and time and well-developed, well-nourished, and in no distress. Vital signs are normal.  HENT:  Head: Normocephalic and atraumatic.  Eyes: Pupils are equal, round, and reactive to light.  Neck: Normal range of motion.  Cardiovascular: Normal rate, regular rhythm and normal heart sounds.  No murmur heard. Pulmonary/Chest: Effort normal and breath sounds normal. He has no wheezes.  Abdominal: Soft. Normal appearance and bowel sounds are normal. He exhibits no distension. There is no tenderness.  Musculoskeletal: Normal range of motion. He exhibits no edema.  Neurological: He is alert and oriented to person, place, and time. Gait normal.  Skin: Skin is warm and dry. No rash noted.  Psychiatric: Mood, memory, affect and judgment normal.    LAB RESULTS:  Lab Results  Component Value Date   NA 138 04/29/2018   K 3.8 04/29/2018   CL 108 04/29/2018   CO2 21 (L) 04/29/2018   GLUCOSE 165 (H) 04/29/2018   BUN 16 04/29/2018   CREATININE 1.16 04/29/2018   CALCIUM 8.7 (L) 04/29/2018   PROT 7.0 04/29/2018   ALBUMIN 4.0 04/29/2018   AST 25 04/29/2018   ALT 29 04/29/2018   ALKPHOS 80 04/29/2018   BILITOT 0.4 04/29/2018   GFRNONAA >60  04/29/2018   GFRAA >60 04/29/2018    Lab Results  Component Value Date   WBC 3.7 (L) 04/29/2018   NEUTROABS 1.0 (L) 04/29/2018   HGB 13.4 04/29/2018   HCT 38.9 (L) 04/29/2018   MCV 94.8 04/29/2018   PLT 263 04/29/2018     STUDIES: Ir Fluoro Guide Cv Line Right  Result Date: 04/03/2018 INDICATION: History of metastatic bladder cancer, in need of durable intravenous access for chemotherapy administration. EXAM: IMPLANTED PORT A CATH PLACEMENT WITH ULTRASOUND AND FLUOROSCOPIC GUIDANCE COMPARISON:  None. MEDICATIONS: Ancef 2 gm IV; The antibiotic was administered within an appropriate time interval prior to skin puncture. ANESTHESIA/SEDATION: Moderate (conscious) sedation was employed during this procedure. A total of Versed 1 mg and Fentanyl 50 mcg was administered intravenously. Moderate Sedation Time: 28 minutes. The patient's level of consciousness and  vital signs were monitored continuously by radiology nursing throughout the procedure under my direct supervision. CONTRAST:  None FLUOROSCOPY TIME:  12 seconds (9 mGy) COMPLICATIONS: None immediate. PROCEDURE: The procedure, risks, benefits, and alternatives were explained to the patient. Questions regarding the procedure were encouraged and answered. The patient understands and consents to the procedure. The right neck and chest were prepped with chlorhexidine in a sterile fashion, and a sterile drape was applied covering the operative field. Maximum barrier sterile technique with sterile gowns and gloves were used for the procedure. A timeout was performed prior to the initiation of the procedure. Local anesthesia was provided with 1% lidocaine with epinephrine. After creating a small venotomy incision, a micropuncture kit was utilized to access the internal jugular vein. Real-time ultrasound guidance was utilized for vascular access including the acquisition of a permanent ultrasound image documenting patency of the accessed vessel. The microwire  was utilized to measure appropriate catheter length. A subcutaneous port pocket was then created along the upper chest wall utilizing a combination of sharp and blunt dissection. The pocket was irrigated with sterile saline. A single lumen power injectable port was chosen for placement. The 8 Fr catheter was tunneled from the port pocket site to the venotomy incision. The port was placed in the pocket. The external catheter was trimmed to appropriate length. At the venotomy, an 8 Fr peel-away sheath was placed over a guidewire under fluoroscopic guidance. The catheter was then placed through the sheath and the sheath was removed. Final catheter positioning was confirmed and documented with a fluoroscopic spot radiograph. The port was accessed with a Huber needle, aspirated and flushed with heparinized saline. The venotomy site was closed with an interrupted 4-0 Vicryl suture. The port pocket incision was closed with interrupted 2-0 Vicryl suture and the skin was opposed with a running subcuticular 4-0 Vicryl suture. Dermabond and Steri-strips were applied to both incisions. Dressings were placed. The patient tolerated the procedure well without immediate post procedural complication. FINDINGS: After catheter placement, the tip lies within the superior cavoatrial junction. The catheter aspirates and flushes normally and is ready for immediate use. IMPRESSION: Successful placement of a right internal jugular approach power injectable Port-A-Cath. The catheter is ready for immediate use. Electronically Signed   By: Sandi Mariscal M.D.   On: 04/03/2018 12:38   [place] Core Biopsy (lymph Nodes)  Result Date: 04/03/2018 INDICATION: Concern for metastatic bladder cancer, now with hypermetabolic left supraclavicular lymph node. Please perform ultrasound-guided biopsy for tissue diagnostic purposes. EXAM: ULTRASOUND-GUIDED LEFT SUPRACLAVICULAR LYMPH NODE BIOPSY COMPARISON:  PET-CT - 03/25/2018 MEDICATIONS: None  ANESTHESIA/SEDATION: Moderate (conscious) sedation was employed during this procedure. A total of Versed 1 mg and Fentanyl 50 mcg was administered intravenously. Moderate Sedation Time: 11 minutes. The patient's level of consciousness and vital signs were monitored continuously by radiology nursing throughout the procedure under my direct supervision. COMPLICATIONS: None immediate. TECHNIQUE: Informed written consent was obtained from the patient after a discussion of the risks, benefits and alternatives to treatment. Questions regarding the procedure were encouraged and answered. Initial ultrasound scanning demonstrated an approximately 1.3 x 1.0 x 0.8 cm lymph node within medial aspect the left supraclavicular fossa (representative images 2 through 6) correlating with the hypermetabolic left supraclavicular lymph node seen on preceding PET-CT image 66, series 3. An ultrasound image was saved for documentation purposes. The procedure was planned. A timeout was performed prior to the initiation of the procedure. The operative was prepped and draped in the usual sterile fashion, and  a sterile drape was applied covering the operative field. A timeout was performed prior to the initiation of the procedure. Local anesthesia was provided with 1% lidocaine with epinephrine. Under direct ultrasound guidance, an 18 gauge core needle device was utilized to obtain to obtain 6 core needle biopsies of the hypermetabolic left supraclavicular lymph node. The samples were placed in saline and submitted to pathology. The needle was removed and hemostasis was achieved with manual compression. Post procedure scan was negative for significant hematoma. A dressing was placed. The patient tolerated the procedure well without immediate postprocedural complication. IMPRESSION: Technically successful ultrasound guided biopsy of hypermetabolic left supraclavicular lymph node. Electronically Signed   By: Sandi Mariscal M.D.   On: 04/03/2018  12:41    ASSESSMENT: Stage IVa urothelial carcinoma with left supraclavicular lymph node metastasis.  PLAN:    1. 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  PET scan results from June 2019 reviewed revealing left supraclavicular, right retrocrural, retroperitoneal and pelvic metastatic adenopathy.  It was agreed upon to start gemcitabine and cisplatin on day 1 and 8 and half day 15 off.  Patient has history of previous nephrectomy, so cisplatin will be a split dose for 21-day cycle.  We will continue to monitor kidney function closely throughout her treatment.  Per Dr. Grayland Ormond the plan is to reimage after 4 cycles.  We will proceed today with cycle 2-day 1 cisplatin and gemcitabine.  He will return to clinic in 1 week for cycle 2-day 8.  2.  Kidney function: Patient's creatinine is 1.16 today.  We will monitor this closely given history of nephrectomy and using cisplatin. 3.  Leukopenia: Patient's ANC today is 1.  We will continue to monitor this. 4.  Anxiety: We will give 0.5 mg Ativan IV today with treatment. RX 0.25 Xanax q 8 for anxiety.   Greater than 50% was spent in counseling and coordination of care with this patient including but not limited to discussion of the relevant topics above (See A&P) including, but not limited to diagnosis and management of acute and chronic medical conditions.   Patient expressed understanding and was in agreement with this plan. He also understands that He can call clinic at any time with any questions, concerns, or complaints.   Cancer Staging Urothelial carcinoma of bladder [facility]) Staging form: Urinary Bladder, AJCC 8th Edition - Clinical: Stage IVA Laur[place] Nancy, cN2, cM1a) - Signed by [name], MD on 04/09/2018   Jacquelin Hawking, NP   04/29/2018 9:01 AM

## 2018-04-29 NOTE — Progress Notes (Signed)
[name] =1.  Ok to proceed with treatment per MD

## 2018-05-06 ENCOUNTER — Inpatient Hospital Stay: Payer: Medicare Other

## 2018-05-06 ENCOUNTER — Inpatient Hospital Stay (HOSPITAL_BASED_OUTPATIENT_CLINIC_OR_DEPARTMENT_OTHER): Payer: Medicare Other | Admitting: Oncology

## 2018-05-06 ENCOUNTER — Encounter: Payer: Self-pay | Admitting: Oncology

## 2018-05-06 VITALS — BP 167/93 | HR 69 | Temp 97.8°F | Resp 16 | Wt 196.0 lb

## 2018-05-06 DIAGNOSIS — Z803 Family history of malignant neoplasm of breast: Secondary | ICD-10-CM

## 2018-05-06 DIAGNOSIS — R109 Unspecified abdominal pain: Secondary | ICD-10-CM | POA: Diagnosis not present

## 2018-05-06 DIAGNOSIS — C770 Secondary and unspecified malignant neoplasm of lymph nodes of head, face and neck: Secondary | ICD-10-CM | POA: Diagnosis not present

## 2018-05-06 DIAGNOSIS — C679 Malignant neoplasm of bladder, unspecified: Secondary | ICD-10-CM

## 2018-05-06 DIAGNOSIS — D72819 Decreased white blood cell count, unspecified: Secondary | ICD-10-CM

## 2018-05-06 DIAGNOSIS — R432 Parageusia: Secondary | ICD-10-CM

## 2018-05-06 DIAGNOSIS — F419 Anxiety disorder, unspecified: Secondary | ICD-10-CM

## 2018-05-06 DIAGNOSIS — Z905 Acquired absence of kidney: Secondary | ICD-10-CM

## 2018-05-06 DIAGNOSIS — R530 Neoplastic (malignant) related fatigue: Secondary | ICD-10-CM

## 2018-05-06 DIAGNOSIS — Z5111 Encounter for antineoplastic chemotherapy: Secondary | ICD-10-CM | POA: Diagnosis not present

## 2018-05-06 DIAGNOSIS — Z87891 Personal history of nicotine dependence: Secondary | ICD-10-CM

## 2018-05-06 DIAGNOSIS — I10 Essential (primary) hypertension: Secondary | ICD-10-CM

## 2018-05-06 LAB — CBC WITH DIFFERENTIAL/PLATELET
Basophils Absolute: 0 10*3/uL (ref 0–0.1)
Basophils Relative: 1 %
EOS PCT: 1 %
Eosinophils Absolute: 0 10*3/uL (ref 0–0.7)
HCT: 36.7 % — ABNORMAL LOW (ref 40.0–52.0)
Hemoglobin: 12.8 g/dL — ABNORMAL LOW (ref 13.0–18.0)
LYMPHS ABS: 2 10*3/uL (ref 1.0–3.6)
LYMPHS PCT: 51 %
MCH: 32.7 pg (ref 26.0–34.0)
MCHC: 35 g/dL (ref 32.0–36.0)
MCV: 93.6 fL (ref 80.0–100.0)
MONO ABS: 0.2 10*3/uL (ref 0.2–1.0)
Monocytes Relative: 6 %
Neutro Abs: 1.6 10*3/uL (ref 1.4–6.5)
Neutrophils Relative %: 41 %
PLATELETS: 348 10*3/uL (ref 150–440)
RBC: 3.92 MIL/uL — ABNORMAL LOW (ref 4.40–5.90)
RDW: 14.1 % (ref 11.5–14.5)
WBC: 3.8 10*3/uL (ref 3.8–10.6)

## 2018-05-06 LAB — COMPREHENSIVE METABOLIC PANEL
ALBUMIN: 3.8 g/dL (ref 3.5–5.0)
ALT: 28 U/L (ref 0–44)
AST: 25 U/L (ref 15–41)
Alkaline Phosphatase: 74 U/L (ref 38–126)
Anion gap: 11 (ref 5–15)
BUN: 19 mg/dL (ref 8–23)
CHLORIDE: 105 mmol/L (ref 98–111)
CO2: 22 mmol/L (ref 22–32)
Calcium: 8.9 mg/dL (ref 8.9–10.3)
Creatinine, Ser: 1.16 mg/dL (ref 0.61–1.24)
GFR calc Af Amer: >60 mL/min (ref >60)
GFR calc non Af Amer: >60 mL/min (ref >60)
GLUCOSE: 131 mg/dL — AB (ref 70–99)
POTASSIUM: 4 mmol/L (ref 3.5–5.1)
Sodium: 138 mmol/L (ref 135–145)
Total Bilirubin: 0.4 mg/dL (ref 0.3–1.2)
Total Protein: 7.1 g/dL (ref 6.5–8.1)

## 2018-05-06 MED ORDER — SODIUM CHLORIDE 0.9 % IV SOLN
35.0000 mg/m2 | Freq: Once | INTRAVENOUS | Status: AC
  Administered 2018-05-06: 74 mg via INTRAVENOUS
  Filled 2018-05-06: qty 74

## 2018-05-06 MED ORDER — PALONOSETRON HCL INJECTION 0.25 MG/5ML
0.2500 mg | Freq: Once | INTRAVENOUS | Status: AC
  Administered 2018-05-06: 0.25 mg via INTRAVENOUS
  Filled 2018-05-06: qty 5

## 2018-05-06 MED ORDER — SODIUM CHLORIDE 0.9 % IV SOLN
2000.0000 mg | Freq: Once | INTRAVENOUS | Status: AC
  Administered 2018-05-06: 2000 mg via INTRAVENOUS
  Filled 2018-05-06: qty 52.6

## 2018-05-06 MED ORDER — HEPARIN SOD (PORK) LOCK FLUSH 100 UNIT/ML IV SOLN
500.0000 [IU] | Freq: Once | INTRAVENOUS | Status: AC | PRN
  Administered 2018-05-06: 500 [IU]
  Filled 2018-05-06: qty 5

## 2018-05-06 MED ORDER — SODIUM CHLORIDE 0.9 % IV SOLN
Freq: Once | INTRAVENOUS | Status: AC
  Administered 2018-05-06: 09:00:00 via INTRAVENOUS
  Filled 2018-05-06: qty 1000

## 2018-05-06 MED ORDER — SODIUM CHLORIDE 0.9 % IV SOLN
Freq: Once | INTRAVENOUS | Status: AC
  Administered 2018-05-06: 12:00:00 via INTRAVENOUS
  Filled 2018-05-06: qty 5

## 2018-05-06 MED ORDER — POTASSIUM CHLORIDE 2 MEQ/ML IV SOLN
Freq: Once | INTRAVENOUS | Status: AC
  Administered 2018-05-06: 10:00:00 via INTRAVENOUS
  Filled 2018-05-06: qty 1000

## 2018-05-06 NOTE — Progress Notes (Signed)
David Rich  Telephone:(336) [phone] Fax:(336) [phone]  ID: David Rich OB: May 02, 1946  MR#: [id number]  PJK#:[id number]  Patient Care Team: Juluis Pitch, MD as PCP - General (Family Medicine)  CHIEF COMPLAINT: Stage IVa urothelial carcinoma with left supraclavicular lymph node metastasis.  INTERVAL HISTORY: Patient presents to clinic today for further evaluation and consideration of cycle 2-day 8 cisplatin and gemcitabine.  He takes day 15 off.  He was seen last week prior to cycle 2-day 1 and was doing pretty well.  He complained of feeling anxious prior to treatment and was started on 0.025 Xanax at bedtime.  He was given 0.5 mg Ativan during treatment with complete relief.  Today patient complains of increasing fatigue, metallic taste in mouth, nausea with treatments and intermittent abdominal pain.  He has regular bowel movements.  He is passing gas.  States the abdominal pain is epigastric in location and is stabbing in nature.  He gets relief with activity and movement.  It is not worse with food or medications.  He denies any fevers, illnesses, changes in appetite, weight loss, chest pain, shortness of breath, vomiting or diarrhea.  He is not taking any medications regularly including his blood pressure medicines.  REVIEW OF SYSTEMS:   Review of Systems  Constitutional: Positive for malaise/fatigue. Negative for chills, fever and weight loss.  HENT: Negative for congestion and ear pain.   Eyes: Negative.  Negative for blurred vision and double vision.  Respiratory: Negative.  Negative for cough, sputum production and shortness of breath.   Cardiovascular: Negative.  Negative for chest pain, palpitations and leg swelling.  Gastrointestinal: Positive for abdominal pain (Intermittent/mostly in the morning) and nausea. Negative for constipation, diarrhea and vomiting.  Genitourinary: Negative for dysuria, frequency and urgency.  Musculoskeletal: Negative for back  pain and falls.  Skin: Negative.  Negative for rash.  Neurological: Positive for weakness. Negative for headaches.  Endo/Heme/Allergies: Negative.  Does not bruise/bleed easily.  Psychiatric/Behavioral: Negative for depression. The patient is nervous/anxious. The patient does not have insomnia.     As per HPI. Otherwise, a complete review of systems is negative.  PAST MEDICAL HISTORY: Past Medical History:  Diagnosis Date  . Hyperlipidemia   . Hypertension   . Renal disorder     PAST SURGICAL HISTORY: Past Surgical History:  Procedure Laterality Date  . CORONARY STENT PLACEMENT    . IR FLUORO GUIDE CV LINE RIGHT  04/03/2018  . kidney removed Left 2013    FAMILY HISTORY: Family History  Problem Relation Age of Onset  . Coronary artery disease Mother   . Anemia Mother   . Kidney failure Mother   . Subarachnoid hemorrhage Father   . Breast cancer Sister 62  . Hypertension Brother     ADVANCED DIRECTIVES (Y/N):  N  HEALTH MAINTENANCE: Social History   Tobacco Use  . Smoking status: Former Smoker    Packs/day: 1.00    Years: 55.00    Pack years: 55.00    Types: Cigarettes    Last attempt to quit: 03/20/2012    Years since quitting: 6.1  . Smokeless tobacco: Never Used  Substance Use Topics  . Alcohol use: Yes    Comment: Social  . Drug use: No     Colonoscopy:  PAP:  Bone density:  Lipid panel:  Allergies  Allergen Reactions  . Clopidogrel Rash and Hives  . Rosuvastatin Calcium Anaphylaxis  . Etodolac Nausea And Vomiting and Other (See Comments)    Bad dreams  Bad dreams   . Hydrocodone Other (See Comments)    Bad dreams Bad dreams   . Other   . Statins   . Meloxicam Rash    Bad dreams Bad dreams   . Pravastatin Sodium Rash    Current Outpatient Medications  Medication Sig Dispense Refill  . acetaminophen (TYLENOL) 325 MG tablet Take by mouth.    . ALPRAZolam (XANAX) 0.25 MG tablet Take 1 tablet (0.25 mg total) by mouth every 8 (eight)  hours as needed for anxiety or sleep. 30 tablet 0  . amLODipine (NORVASC) 2.5 MG tablet Take 1 tablet (2.5 mg total) by mouth daily. 7 tablet 0  . aspirin EC 81 MG tablet Take by mouth.    . Garlic 607 MG TABS Take by mouth.     . lidocaine-prilocaine (EMLA) cream Apply to affected area once 30 g 3  . metoprolol tartrate (LOPRESSOR) 25 MG tablet Take 25 mg by mouth.     . Multiple Vitamin (MULTI-VITAMINS) TABS Take by mouth.    . Omega-3 Fatty Acids (FISH OIL OMEGA-3) 1000 MG CAPS Take by mouth daily.    [name] omeprazole (PRILOSEC) 20 MG capsule Take 1 capsule (20 mg total) by mouth daily. 30 capsule 3  . ondansetron (ZOFRAN) 8 MG tablet Take 1 tablet (8 mg total) by mouth 2 (two) times daily as needed. (Patient not taking: Reported on 04/29/2018) 30 tablet 2  . prochlorperazine (COMPAZINE) 10 MG tablet Take 1 tablet (10 mg total) by mouth every 6 (six) hours as needed (Nausea or vomiting). (Patient not taking: Reported on 04/29/2018) 60 tablet 2   No current facility-administered medications for this visit.     OBJECTIVE: There were no vitals filed for this visit.   There is no height or weight on file to calculate BMI.    ECOG FS:0 - Asymptomatic  Physical Exam  Constitutional: He is oriented to person, place, and time and well-developed, well-nourished, and in no distress. Vital signs are normal.  HENT:  Head: Normocephalic and atraumatic.  Eyes: Pupils are equal, round, and reactive to light.  Neck: Normal range of motion.  Cardiovascular: Normal rate, regular rhythm and normal heart sounds.  No murmur heard. Pulmonary/Chest: Effort normal and breath sounds normal. He has no wheezes.  Abdominal: Soft. Normal appearance and bowel sounds are normal. He exhibits no distension. There is no tenderness.  Musculoskeletal: Normal range of motion. He exhibits no edema.  Neurological: He is alert and oriented to person, place, and time. Gait normal.  Skin: Skin is warm and dry. No rash noted.    Psychiatric: Mood, memory, affect and judgment normal.    LAB RESULTS:  Lab Results  Component Value Date   NA 138 05/06/2018   K 4.0 05/06/2018   CL 105 05/06/2018   CO2 22 05/06/2018   GLUCOSE 131 (H) 05/06/2018   BUN 19 05/06/2018   CREATININE 1.16 05/06/2018   CALCIUM 8.9 05/06/2018   PROT 7.1 05/06/2018   ALBUMIN 3.8 05/06/2018   AST 25 05/06/2018   ALT 28 05/06/2018   ALKPHOS 74 05/06/2018   BILITOT 0.4 05/06/2018   GFRNONAA >60 05/06/2018   GFRAA >60 05/06/2018    Lab Results  Component Value Date   WBC 3.8 05/06/2018   NEUTROABS 1.6 05/06/2018   HGB 12.8 (L) 05/06/2018   HCT 36.7 (L) 05/06/2018   MCV 93.6 05/06/2018   PLT 348 05/06/2018     STUDIES: No results found.  ASSESSMENT: Stage IVa urothelial carcinoma  with left supraclavicular lymph node metastasis.  PLAN:    1. 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  PET scan results from June 2019 reviewed revealing left supraclavicular, right retrocrural, retroperitoneal and pelvic metastatic adenopathy.  It was agreed upon to start gemcitabine and cisplatin on day 1 and 8 and half day 15 off.  Patient has history of previous nephrectomy, so cisplatin will be a split dose for 21-day cycle.  We will continue to monitor kidney function closely throughout his treatment.  Per Dr. Grayland Ormond the plan is to reimage after 4 cycles.  We will proceed today with cycle 2-day 8 cisplatin and gemcitabine.  Return to clinic in 2 weeks with labs, MD assessment and cycle 3-day 1. 2.  Kidney function: Patient's creatinine is 1.16 (1.16) today.  We will monitor this closely given history of nephrectomy and using cisplatin. 3.  Leukopenia:  His ANC has improved today and is 1.6 (1).  Continue to monitor. 4.  Anxiety:  continue Xanax 0.25 mg at bedtime.  This appears to be helping. 5.  Fatigue: d/t chemotherapy.  Blood counts  are stable. 6.  Nausea: Instructed him to take his Zofran as needed for nausea.  He has not been taking any medications regularly. 7.  Hypertension: Instructed patient to start back on his blood pressure medicines. 8.  Metallic taste in mouth: Asked him to pick up Biotene at local pharmacy to help keep mouth hydrated and clean. 9.  Abdominal pain: Constipation?  Medication?  Unclear etiology.  Will place on bowel regimen including Colace and MiraLAX daily.  Appears to have had problems in the past with constipation.  If this continues we will get stat abdominal x-ray for work-up.  Will call patient on Friday to assess.  Greater than 50% was spent in counseling and coordination of care with this patient including but not limited to discussion of the relevant topics above (See A&P) including, but not limited to diagnosis and management of acute and chronic medical conditions.   Patient expressed understanding and was in agreement with this plan. He also understands that He can call clinic at any time with any questions, concerns, or complaints.   Cancer Staging Urothelial carcinoma of bladder [facility]) Staging form: Urinary Bladder, AJCC 8th Edition - Clinical: Stage IVA Laurier Nancy, cN2, cM1a) - Signed by [name], MD on 04/09/2018   Jacquelin Hawking, NP   05/06/2018 8:43 AM

## 2018-05-17 NOTE — Progress Notes (Signed)
David Rich  Telephone:(336) [phone] Fax:(336) [phone]  ID: David Rich OB: [birth year]  MR#: [id number]  DPO#:[id number]  Patient Care Team: Juluis Pitch, MD as PCP - General (Family Medicine)  CHIEF COMPLAINT: Stage IVa urothelial carcinoma with left supraclavicular lymph node metastasis.  INTERVAL HISTORY: Patient returns to clinic today for further evaluation and consideration of cycle 3, day 1 of cisplatin and gemcitabine.  He has noticed increased nausea as well as fatigue with his recent infusions.  He otherwise is tolerating them well.  He has no neurologic complaints.  He denies any recent fevers or illnesses.  He has a fair appetite and denies weight loss.  He has no chest pain or shortness of breath.  He denies any vomiting, constipation, or diarrhea.  He has no urinary complaints.  Patient offers no further specific complaints today.  REVIEW OF SYSTEMS:   Review of Systems  Constitutional: Positive for malaise/fatigue. Negative for fever and weight loss.  Respiratory: Negative.  Negative for cough and shortness of breath.   Cardiovascular: Negative.  Negative for chest pain and leg swelling.  Gastrointestinal: Positive for nausea. Negative for abdominal pain, constipation, diarrhea and vomiting.  Genitourinary: Negative.  Negative for dysuria and hematuria.  Musculoskeletal: Negative.  Negative for back pain.  Skin: Negative.  Negative for rash.  Neurological: Negative.  Negative for sensory change, focal weakness, weakness and headaches.  Psychiatric/Behavioral: Negative.  The patient is not nervous/anxious.     As per HPI. Otherwise, a complete review of systems is negative.  PAST MEDICAL HISTORY: Past Medical History:  Diagnosis Date  . Hyperlipidemia   . Hypertension   . Renal disorder     PAST SURGICAL HISTORY: Past Surgical History:  Procedure Laterality Date  . CORONARY STENT PLACEMENT    . IR FLUORO GUIDE CV LINE RIGHT  04/03/2018    . kidney removed Left 2013    FAMILY HISTORY: Family History  Problem Relation Age of Onset  . Coronary artery disease Mother   . Anemia Mother   . Kidney failure Mother   . Subarachnoid hemorrhage Father   . Breast cancer Sister 74  . Hypertension Brother     ADVANCED DIRECTIVES (Y/N):  N  HEALTH MAINTENANCE: Social History   Tobacco Use  . Smoking status: Former Smoker    Packs/day: 1.00    Years: 55.00    Pack years: 55.00    Types: Cigarettes    Last attempt to quit: 03/20/2012    Years since quitting: 6.1  . Smokeless tobacco: Never Used  Substance Use Topics  . Alcohol use: Yes    Comment: Social  . Drug use: No     Colonoscopy:  PAP:  Bone density:  Lipid panel:  Allergies  Allergen Reactions  . Clopidogrel Rash and Hives  . Rosuvastatin Calcium Anaphylaxis  . Etodolac Nausea And Vomiting and Other (See Comments)    Bad dreams Bad dreams   . Hydrocodone Other (See Comments)    Bad dreams Bad dreams   . Other   . Statins   . Meloxicam Rash    Bad dreams Bad dreams   . Pravastatin Sodium Rash    Current Outpatient Medications  Medication Sig Dispense Refill  . acetaminophen (TYLENOL) 325 MG tablet Take by mouth.    . ALPRAZolam (XANAX) 0.25 MG tablet Take 1 tablet (0.25 mg total) by mouth every 8 (eight) hours as needed for anxiety or sleep. 30 tablet 0  . aspirin EC 81 MG tablet  Take by mouth.    . Garlic 681 MG TABS Take by mouth.     . lidocaine-prilocaine (EMLA) cream Apply to affected area once 30 g 3  . metoprolol tartrate (LOPRESSOR) 25 MG tablet Take 25 mg by mouth.     . Multiple Vitamin (MULTI-VITAMINS) TABS Take by mouth.    . Omega-3 Fatty Acids (FISH OIL OMEGA-3) 1000 MG CAPS Take by mouth daily.    [name] omeprazole (PRILOSEC) 20 MG capsule Take 1 capsule (20 mg total) by mouth daily. 30 capsule 3  . ondansetron (ZOFRAN) 8 MG tablet Take 1 tablet (8 mg total) by mouth 2 (two) times daily as needed. 30 tablet 2  . prochlorperazine  (COMPAZINE) 10 MG tablet Take 1 tablet (10 mg total) by mouth every 6 (six) hours as needed (Nausea or vomiting). 60 tablet 2  . amLODipine (NORVASC) 2.5 MG tablet Take 1 tablet (2.5 mg total) by mouth daily. (Patient not taking: Reported on 05/06/2018) 7 tablet 0  . promethazine (PHENERGAN) 25 MG tablet Take 1 tablet (25 mg total) by mouth every 6 (six) hours as needed for nausea or vomiting. 30 tablet 2   No current facility-administered medications for this visit.     OBJECTIVE: Vitals:   05/20/18 0902  BP: (!) 141/87  Pulse: 66  Resp: 18  Temp: 97.9 F (36.6 C)     Body mass index is 27.49 kg/m.    ECOG FS:0 - Asymptomatic  General: Well-developed, well-nourished, no acute distress. Eyes: Pink conjunctiva, anicteric sclera. HEENT: Normocephalic, moist mucous membranes. Lungs: Clear to auscultation bilaterally. Heart: Regular rate and rhythm. No rubs, murmurs, or gallops. Abdomen: Soft, nontender, nondistended. No organomegaly noted, normoactive bowel sounds. Musculoskeletal: No edema, cyanosis, or clubbing. Neuro: Alert, answering all questions appropriately. Cranial nerves grossly intact. Skin: No rashes or petechiae noted. Psych: Normal affect.  LAB RESULTS:  Lab Results  Component Value Date   NA 138 05/20/2018   K 4.0 05/20/2018   CL 107 05/20/2018   CO2 22 05/20/2018   GLUCOSE 131 (H) 05/20/2018   BUN 16 05/20/2018   CREATININE 1.19 05/20/2018   CALCIUM 8.5 (L) 05/20/2018   PROT 6.6 05/20/2018   ALBUMIN 3.8 05/20/2018   AST 23 05/20/2018   ALT 24 05/20/2018   ALKPHOS 83 05/20/2018   BILITOT 0.3 05/20/2018   GFRNONAA >60 05/20/2018   GFRAA >60 05/20/2018    Lab Results  Component Value Date   WBC 5.2 05/20/2018   NEUTROABS 2.6 05/20/2018   HGB 11.5 (L) 05/20/2018   HCT 33.9 (L) 05/20/2018   MCV 94.5 05/20/2018   PLT 215 05/20/2018     STUDIES: No results found.  ASSESSMENT: Stage IVa urothelial carcinoma with left supraclavicular lymph node  metastasis.  PLAN:    1. 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  PET scan results from March 25, 2018 reviewed independently confirming stage. Patient will receive gemcitabine and cisplatin on day 1 and 8 and have day 15 off.  Because patient has had previous nephrectomy, will use a split dose of cisplatin.  This will be a 21-day cycle.  Continue to monitor kidney function closely throughout treatment.  Plan to do 4 cycles then reimage.  Because of patient's increasing fatigue and nausea, will dose reduce gemcitabine.  Proceed with cycle 3, day 1 today.  Return to clinic in 1 week for further evaluation and consideration of cycle 3, day 8.  2.  Genetic testing: Pending at time of dictation.   3.  Kidney function: Creatinine remains stable and at patient's baseline of approximately 1.19.  Monitor closely given his history of nephrectomy and using cisplatin. 4.  Leukopenia: Resolved. 5.  Nausea: Patient was given a prescription for Phenergan today.   Patient expressed understanding and was in agreement with this plan. He also understands that He can call clinic at any time with any questions, concerns, or complaints.   Cancer Staging Urothelial carcinoma of bladder [facility]) Staging form: Urinary Bladder, AJCC 8th Edition - Clinical: Stage IVA Laurier Nancy, cN2, cM1a) - Signed by [name], MD on 04/09/2018   [name], MD   05/24/2018 6:38 AM

## 2018-05-20 ENCOUNTER — Encounter: Payer: Self-pay | Admitting: Oncology

## 2018-05-20 ENCOUNTER — Inpatient Hospital Stay (HOSPITAL_BASED_OUTPATIENT_CLINIC_OR_DEPARTMENT_OTHER): Payer: Medicare Other | Admitting: Oncology

## 2018-05-20 ENCOUNTER — Inpatient Hospital Stay: Payer: Medicare Other

## 2018-05-20 ENCOUNTER — Inpatient Hospital Stay: Payer: Medicare Other | Attending: Oncology

## 2018-05-20 VITALS — BP 141/87 | HR 66 | Temp 97.9°F | Resp 18 | Wt 197.1 lb

## 2018-05-20 DIAGNOSIS — R110 Nausea: Secondary | ICD-10-CM | POA: Diagnosis not present

## 2018-05-20 DIAGNOSIS — C770 Secondary and unspecified malignant neoplasm of lymph nodes of head, face and neck: Secondary | ICD-10-CM

## 2018-05-20 DIAGNOSIS — Z905 Acquired absence of kidney: Secondary | ICD-10-CM | POA: Diagnosis not present

## 2018-05-20 DIAGNOSIS — Z5111 Encounter for antineoplastic chemotherapy: Secondary | ICD-10-CM | POA: Insufficient documentation

## 2018-05-20 DIAGNOSIS — C679 Malignant neoplasm of bladder, unspecified: Secondary | ICD-10-CM | POA: Diagnosis present

## 2018-05-20 LAB — COMPREHENSIVE METABOLIC PANEL
ALBUMIN: 3.8 g/dL (ref 3.5–5.0)
ALK PHOS: 83 U/L (ref 38–126)
ALT: 24 U/L (ref 0–44)
AST: 23 U/L (ref 15–41)
Anion gap: 9 (ref 5–15)
BUN: 16 mg/dL (ref 8–23)
CALCIUM: 8.5 mg/dL — AB (ref 8.9–10.3)
CHLORIDE: 107 mmol/L (ref 98–111)
CO2: 22 mmol/L (ref 22–32)
CREATININE: 1.19 mg/dL (ref 0.61–1.24)
GFR calc Af Amer: >60 mL/min (ref >60)
GFR calc non Af Amer: >60 mL/min (ref >60)
GLUCOSE: 131 mg/dL — AB (ref 70–99)
Potassium: 4 mmol/L (ref 3.5–5.1)
SODIUM: 138 mmol/L (ref 135–145)
Total Bilirubin: 0.3 mg/dL (ref 0.3–1.2)
Total Protein: 6.6 g/dL (ref 6.5–8.1)

## 2018-05-20 LAB — CBC WITH DIFFERENTIAL/PLATELET
BASOS PCT: 1 %
Basophils Absolute: 0 10*3/uL (ref 0–0.1)
EOS ABS: 0.2 10*3/uL (ref 0–0.7)
Eosinophils Relative: 4 %
HCT: 33.9 % — ABNORMAL LOW (ref 40.0–52.0)
Hemoglobin: 11.5 g/dL — ABNORMAL LOW (ref 13.0–18.0)
LYMPHS ABS: 1.7 10*3/uL (ref 1.0–3.6)
Lymphocytes Relative: 33 %
MCH: 32.2 pg (ref 26.0–34.0)
MCHC: 34.1 g/dL (ref 32.0–36.0)
MCV: 94.5 fL (ref 80.0–100.0)
MONO ABS: 0.7 10*3/uL (ref 0.2–1.0)
MONOS PCT: 13 %
Neutro Abs: 2.6 10*3/uL (ref 1.4–6.5)
Neutrophils Relative %: 49 %
Platelets: 215 10*3/uL (ref 150–440)
RBC: 3.58 MIL/uL — ABNORMAL LOW (ref 4.40–5.90)
RDW: 14.4 % (ref 11.5–14.5)
WBC: 5.2 10*3/uL (ref 3.8–10.6)

## 2018-05-20 MED ORDER — SODIUM CHLORIDE 0.9 % IV SOLN
Freq: Once | INTRAVENOUS | Status: AC
  Administered 2018-05-20: 09:00:00 via INTRAVENOUS
  Filled 2018-05-20: qty 1000

## 2018-05-20 MED ORDER — PROMETHAZINE HCL 25 MG PO TABS: 25.0000 mg | ORAL_TABLET | Freq: Four times a day (QID) | ORAL | 2 refills | Status: DC | PRN

## 2018-05-20 MED ORDER — POTASSIUM CHLORIDE 2 MEQ/ML IV SOLN
Freq: Once | INTRAVENOUS | Status: AC
  Administered 2018-05-20: 10:00:00 via INTRAVENOUS
  Filled 2018-05-20: qty 1000

## 2018-05-20 MED ORDER — SODIUM CHLORIDE 0.9% FLUSH
10.0000 mL | INTRAVENOUS | Status: DC | PRN
  Administered 2018-05-20: 10 mL
  Filled 2018-05-20: qty 10

## 2018-05-20 MED ORDER — SODIUM CHLORIDE 0.9 % IV SOLN
1600.0000 mg | Freq: Once | INTRAVENOUS | Status: AC
  Administered 2018-05-20: 1600 mg via INTRAVENOUS
  Filled 2018-05-20: qty 42.08

## 2018-05-20 MED ORDER — FOSAPREPITANT DIMEGLUMINE INJECTION 150 MG
Freq: Once | INTRAVENOUS | Status: AC
  Administered 2018-05-20: 12:00:00 via INTRAVENOUS
  Filled 2018-05-20: qty 5

## 2018-05-20 MED ORDER — PALONOSETRON HCL INJECTION 0.25 MG/5ML
0.2500 mg | Freq: Once | INTRAVENOUS | Status: AC
  Administered 2018-05-20: 0.25 mg via INTRAVENOUS
  Filled 2018-05-20: qty 5

## 2018-05-20 MED ORDER — HEPARIN SOD (PORK) LOCK FLUSH 100 UNIT/ML IV SOLN
500.0000 [IU] | Freq: Once | INTRAVENOUS | Status: AC | PRN
  Administered 2018-05-20: 500 [IU]
  Filled 2018-05-20: qty 5

## 2018-05-20 MED ORDER — SODIUM CHLORIDE 0.9 % IV SOLN
35.0000 mg/m2 | Freq: Once | INTRAVENOUS | Status: AC
  Administered 2018-05-20: 74 mg via INTRAVENOUS
  Filled 2018-05-20: qty 74

## 2018-05-20 NOTE — Progress Notes (Signed)
Patient reports nausea and fatigue today.

## 2018-05-26 NOTE — Progress Notes (Signed)
[place]  Telephone:(336) [phone] Fax:(336) [phone]  ID: David Rich OB: [birth year]  MR#: [id number]  OJJ#:[id number]  Patient Care Team: Juluis Pitch, MD as PCP - General (Family Medicine)  CHIEF COMPLAINT: Stage IVa urothelial carcinoma with left supraclavicular lymph node metastasis.  INTERVAL HISTORY: Patient returns to clinic today for further evaluation and consideration of cycle 3, day 8 of dose reduced cisplatin and gemcitabine.  His nausea was only mildly improved this week.  He is otherwise tolerating his treatments well. He has no neurologic complaints.  He denies any recent fevers or illnesses.  He has a fair appetite and denies weight loss.  He has no chest pain or shortness of breath. He denies any vomiting, constipation, or diarrhea.  He has no urinary complaints.  Patient offers no further specific complaints today.  REVIEW OF SYSTEMS:   Review of Systems  Constitutional: Positive for malaise/fatigue. Negative for fever and weight loss.  Respiratory: Negative.  Negative for cough and shortness of breath.   Cardiovascular: Negative.  Negative for chest pain and leg swelling.  Gastrointestinal: Positive for nausea. Negative for abdominal pain, constipation, diarrhea and vomiting.  Genitourinary: Negative.  Negative for dysuria and hematuria.  Musculoskeletal: Negative.  Negative for back pain.  Skin: Negative.  Negative for rash.  Neurological: Negative.  Negative for sensory change, focal weakness, weakness and headaches.  Psychiatric/Behavioral: Negative.  The patient is not nervous/anxious.     As per HPI. Otherwise, a complete review of systems is negative.  PAST MEDICAL HISTORY: Past Medical History:  Diagnosis Date  . Hyperlipidemia   . Hypertension   . Renal disorder     PAST SURGICAL HISTORY: Past Surgical History:  Procedure Laterality Date  . CORONARY STENT PLACEMENT    . IR FLUORO GUIDE CV LINE RIGHT  04/03/2018  . kidney  removed Left 2013    FAMILY HISTORY: Family History  Problem Relation Age of Onset  . Coronary artery disease Mother   . Anemia Mother   . Kidney failure Mother   . Subarachnoid hemorrhage Father   . Breast cancer Sister 46  . Hypertension Brother     ADVANCED DIRECTIVES (Y/N):  N  HEALTH MAINTENANCE: Social History   Tobacco Use  . Smoking status: Former Smoker    Packs/day: 1.00    Years: 55.00    Pack years: 55.00    Types: Cigarettes    Last attempt to quit: 03/20/2012    Years since quitting: 6.1  . Smokeless tobacco: Never Used  Substance Use Topics  . Alcohol use: Yes    Comment: Social  . Drug use: No     Colonoscopy:  PAP:  Bone density:  Lipid panel:  Allergies  Allergen Reactions  . Clopidogrel Rash and Hives  . Rosuvastatin Calcium Anaphylaxis  . Etodolac Nausea And Vomiting and Other (See Comments)    Bad dreams Bad dreams   . Hydrocodone Other (See Comments)    Bad dreams Bad dreams   . Other   . Statins   . Meloxicam Rash    Bad dreams Bad dreams   . Pravastatin Sodium Rash    Current Outpatient Medications  Medication Sig Dispense Refill  . amLODipine (NORVASC) 2.5 MG tablet Take 1 tablet (2.5 mg total) by mouth daily. 7 tablet 0  . lidocaine-prilocaine (EMLA) cream Apply to affected area once 30 g 3  . metoprolol tartrate (LOPRESSOR) 25 MG tablet Take 25 mg by mouth.     [name] omeprazole (PRILOSEC)  20 MG capsule Take 1 capsule (20 mg total) by mouth daily. 30 capsule 3  . ondansetron (ZOFRAN) 8 MG tablet Take 1 tablet (8 mg total) by mouth 2 (two) times daily as needed. 30 tablet 2  . prochlorperazine (COMPAZINE) 10 MG tablet Take 1 tablet (10 mg total) by mouth every 6 (six) hours as needed (Nausea or vomiting). 60 tablet 2  . promethazine (PHENERGAN) 25 MG tablet Take 1 tablet (25 mg total) by mouth every 6 (six) hours as needed for nausea or vomiting. 30 tablet 2  . acetaminophen (TYLENOL) 325 MG tablet Take by mouth.    .  ALPRAZolam (XANAX) 0.25 MG tablet Take 1 tablet (0.25 mg total) by mouth every 8 (eight) hours as needed for anxiety or sleep. (Patient not taking: Reported on 05/27/2018) 30 tablet 0  . aspirin EC 81 MG tablet Take by mouth.    . Garlic 161 MG TABS Take by mouth.     . metoCLOPramide (REGLAN) 10 MG tablet Take 1 tablet (10 mg total) by mouth 4 (four) times daily. 60 tablet 1  . Multiple Vitamin (MULTI-VITAMINS) TABS Take by mouth.    . Omega-3 Fatty Acids (FISH OIL OMEGA-3) 1000 MG CAPS Take by mouth daily.     No current facility-administered medications for this visit.     OBJECTIVE: Vitals:   05/27/18 0837  BP: (!) 156/76  Pulse: 71  Resp: 18  Temp: (!) 96 F (35.6 C)     Body mass index is 27.75 kg/m.    ECOG FS:0 - Asymptomatic  General: Well-developed, well-nourished, no acute distress. Eyes: Pink conjunctiva, anicteric sclera. HEENT: Normocephalic, moist mucous membranes. Lungs: Clear to auscultation bilaterally. Heart: Regular rate and rhythm. No rubs, murmurs, or gallops. Abdomen: Soft, nontender, nondistended. No organomegaly noted, normoactive bowel sounds. Musculoskeletal: No edema, cyanosis, or clubbing. Neuro: Alert, answering all questions appropriately. Cranial nerves grossly intact. Skin: No rashes or petechiae noted. Psych: Normal affect.  LAB RESULTS:  Lab Results  Component Value Date   NA 136 05/27/2018   K 4.2 05/27/2018   CL 106 05/27/2018   CO2 25 05/27/2018   GLUCOSE 118 (H) 05/27/2018   BUN 21 05/27/2018   CREATININE 1.17 05/27/2018   CALCIUM 8.9 05/27/2018   PROT 6.8 05/27/2018   ALBUMIN 3.9 05/27/2018   AST 21 05/27/2018   ALT 18 05/27/2018   ALKPHOS 83 05/27/2018   BILITOT 0.6 05/27/2018   GFRNONAA >60 05/27/2018   GFRAA >60 05/27/2018    Lab Results  Component Value Date   WBC 4.3 05/27/2018   NEUTROABS 1.7 05/27/2018   HGB 11.4 (L) 05/27/2018   HCT 32.2 (L) 05/27/2018   MCV 93.4 05/27/2018   PLT 301 05/27/2018      STUDIES: No results found.  ASSESSMENT: Stage IVa urothelial carcinoma with left supraclavicular lymph node metastasis.  PLAN:    1. 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  PET scan results from March 25, 2018 reviewed independently confirming stage. Patient will receive gemcitabine and cisplatin on day 1 and 8 and have day 15 off.  Because patient has had previous nephrectomy, will use a split dose of cisplatin.  This will be a 21-day cycle.  Gemcitabine has been dose reduced secondary to persistent nausea and fatigue.  Continue to monitor kidney function closely throughout treatment.  Proceed with cycle 3, day 8 of treatment today.  Return to clinic in 2 weeks for  further evaluation and consideration of cycle 4, day 1.  Plan to reimage at the conclusion of cycle 4. 2.  Genetic testing: Pending at time of dictation.   3.  Kidney function: Creatinine remains stable at 1.17.  Monitor closely given his history of nephrectomy and using cisplatin. 4.  Leukopenia: Resolved. 5.  Nausea: Chronic.  Patient was given a prescription for Reglan every 6 hours.  He has been instructed to use Phenergan, Compazine, and Zofran as needed.   Patient expressed understanding and was in agreement with this plan. He also understands that He can call clinic at any time with any questions, concerns, or complaints.   Cancer Staging Urothelial carcinoma of bladder [facility]) Staging form: Urinary Bladder, AJCC 8th Edition - Clinical: Stage IVA Laurier Nancy, cN2, cM1a) - Signed by [name], MD on 04/09/2018   [name], MD   05/27/2018 4:42 PM

## 2018-05-27 ENCOUNTER — Inpatient Hospital Stay: Payer: Medicare Other

## 2018-05-27 ENCOUNTER — Other Ambulatory Visit: Payer: Self-pay

## 2018-05-27 ENCOUNTER — Inpatient Hospital Stay (HOSPITAL_BASED_OUTPATIENT_CLINIC_OR_DEPARTMENT_OTHER): Payer: Medicare Other | Admitting: Oncology

## 2018-05-27 VITALS — BP 156/76 | HR 71 | Temp 96.0°F | Resp 18 | Wt 199.0 lb

## 2018-05-27 DIAGNOSIS — Z905 Acquired absence of kidney: Secondary | ICD-10-CM | POA: Diagnosis not present

## 2018-05-27 DIAGNOSIS — C770 Secondary and unspecified malignant neoplasm of lymph nodes of head, face and neck: Secondary | ICD-10-CM | POA: Diagnosis not present

## 2018-05-27 DIAGNOSIS — C679 Malignant neoplasm of bladder, unspecified: Secondary | ICD-10-CM | POA: Diagnosis not present

## 2018-05-27 DIAGNOSIS — R110 Nausea: Secondary | ICD-10-CM

## 2018-05-27 DIAGNOSIS — Z5111 Encounter for antineoplastic chemotherapy: Secondary | ICD-10-CM | POA: Diagnosis not present

## 2018-05-27 LAB — COMPREHENSIVE METABOLIC PANEL
ALBUMIN: 3.9 g/dL (ref 3.5–5.0)
ALT: 18 U/L (ref 0–44)
AST: 21 U/L (ref 15–41)
Alkaline Phosphatase: 83 U/L (ref 38–126)
Anion gap: 5 (ref 5–15)
BUN: 21 mg/dL (ref 8–23)
CO2: 25 mmol/L (ref 22–32)
CREATININE: 1.17 mg/dL (ref 0.61–1.24)
Calcium: 8.9 mg/dL (ref 8.9–10.3)
Chloride: 106 mmol/L (ref 98–111)
GFR calc Af Amer: >60 mL/min (ref >60)
GFR calc non Af Amer: >60 mL/min (ref >60)
Glucose, Bld: 118 mg/dL — ABNORMAL HIGH (ref 70–99)
POTASSIUM: 4.2 mmol/L (ref 3.5–5.1)
SODIUM: 136 mmol/L (ref 135–145)
Total Bilirubin: 0.6 mg/dL (ref 0.3–1.2)
Total Protein: 6.8 g/dL (ref 6.5–8.1)

## 2018-05-27 LAB — CBC WITH DIFFERENTIAL/PLATELET
Basophils Absolute: 0.1 10*3/uL (ref 0–0.1)
Basophils Relative: 2 %
EOS ABS: 0.1 10*3/uL (ref 0–0.7)
EOS PCT: 1 %
HCT: 32.2 % — ABNORMAL LOW (ref 40.0–52.0)
HEMOGLOBIN: 11.4 g/dL — AB (ref 13.0–18.0)
LYMPHS ABS: 2 10*3/uL (ref 1.0–3.6)
LYMPHS PCT: 47 %
MCH: 33 pg (ref 26.0–34.0)
MCHC: 35.3 g/dL (ref 32.0–36.0)
MCV: 93.4 fL (ref 80.0–100.0)
MONOS PCT: 10 %
Monocytes Absolute: 0.4 10*3/uL (ref 0.2–1.0)
Neutro Abs: 1.7 10*3/uL (ref 1.4–6.5)
Neutrophils Relative %: 40 %
Platelets: 301 10*3/uL (ref 150–440)
RBC: 3.45 MIL/uL — AB (ref 4.40–5.90)
RDW: 15.2 % — ABNORMAL HIGH (ref 11.5–14.5)
WBC: 4.3 10*3/uL (ref 3.8–10.6)

## 2018-05-27 MED ORDER — SODIUM CHLORIDE 0.9 % IV SOLN
1600.0000 mg | Freq: Once | INTRAVENOUS | Status: AC
  Administered 2018-05-27: 1600 mg via INTRAVENOUS
  Filled 2018-05-27: qty 26.3

## 2018-05-27 MED ORDER — SODIUM CHLORIDE 0.9 % IV SOLN
Freq: Once | INTRAVENOUS | Status: AC
  Administered 2018-05-27: 10:00:00 via INTRAVENOUS
  Filled 2018-05-27: qty 250

## 2018-05-27 MED ORDER — SODIUM CHLORIDE 0.9 % IV SOLN
35.0000 mg/m2 | Freq: Once | INTRAVENOUS | Status: AC
  Administered 2018-05-27: 74 mg via INTRAVENOUS
  Filled 2018-05-27: qty 74

## 2018-05-27 MED ORDER — POTASSIUM CHLORIDE 2 MEQ/ML IV SOLN
Freq: Once | INTRAVENOUS | Status: AC
  Administered 2018-05-27: 10:00:00 via INTRAVENOUS
  Filled 2018-05-27: qty 1000

## 2018-05-27 MED ORDER — PALONOSETRON HCL INJECTION 0.25 MG/5ML
0.2500 mg | Freq: Once | INTRAVENOUS | Status: AC
  Administered 2018-05-27: 0.25 mg via INTRAVENOUS
  Filled 2018-05-27: qty 5

## 2018-05-27 MED ORDER — FOSAPREPITANT DIMEGLUMINE INJECTION 150 MG
Freq: Once | INTRAVENOUS | Status: AC
  Administered 2018-05-27: 12:00:00 via INTRAVENOUS
  Filled 2018-05-27: qty 5

## 2018-05-27 MED ORDER — HEPARIN SOD (PORK) LOCK FLUSH 100 UNIT/ML IV SOLN
500.0000 [IU] | Freq: Once | INTRAVENOUS | Status: AC
  Administered 2018-05-27: 500 [IU] via INTRAVENOUS
  Filled 2018-05-27: qty 5

## 2018-05-27 MED ORDER — SODIUM CHLORIDE 0.9% FLUSH
10.0000 mL | Freq: Once | INTRAVENOUS | Status: AC
  Administered 2018-05-27: 10 mL via INTRAVENOUS
  Filled 2018-05-27: qty 10

## 2018-05-27 MED ORDER — METOCLOPRAMIDE HCL 10 MG PO TABS: 10.0000 mg | ORAL_TABLET | Freq: Four times a day (QID) | ORAL | 1 refills | Status: DC

## 2018-05-27 NOTE — Progress Notes (Signed)
Here for follow up. Stated daily nausea no vomiting . zofran helps.  abd apin per pt improved. Feeling " tired all the time"

## 2018-06-03 ENCOUNTER — Other Ambulatory Visit: Payer: Medicare Other

## 2018-06-03 ENCOUNTER — Ambulatory Visit: Payer: Medicare Other | Admitting: Oncology

## 2018-06-03 ENCOUNTER — Ambulatory Visit: Payer: Medicare Other

## 2018-06-03 ENCOUNTER — Other Ambulatory Visit: Payer: Self-pay | Admitting: *Deleted

## 2018-06-03 MED ORDER — ALPRAZOLAM 0.25 MG PO TABS: 0.2500 mg | ORAL_TABLET | Freq: Three times a day (TID) | ORAL | 0 refills | Status: DC | PRN

## 2018-06-08 NOTE — Progress Notes (Signed)
[place]  Telephone:(336) [phone] Fax:(336) [phone]  ID: David Rich OB: [birth year]  MR#: [id number]  LKG#:[id number]  Patient Care Team: David Pitch, MD as PCP - General (Family Medicine)  CHIEF COMPLAINT: Stage IVa urothelial carcinoma with left supraclavicular lymph node metastasis.  INTERVAL HISTORY: Patient returns to clinic today for further evaluation and consideration of cycle 4, day 1 of dose reduced cisplatin and gemcitabine.  He continues to have persistent and ongoing nausea.   He is otherwise tolerating his treatments well. He has no neurologic complaints.  He denies any recent fevers or illnesses.  He has a fair appetite and denies weight loss.  He has no chest pain or shortness of breath. He denies any vomiting, constipation, or diarrhea.  He has no urinary complaints.  Patient offers no further specific complaints today.  REVIEW OF SYSTEMS:   Review of Systems  Constitutional: Positive for malaise/fatigue. Negative for fever and weight loss.  Respiratory: Negative.  Negative for cough and shortness of breath.   Cardiovascular: Negative.  Negative for chest pain and leg swelling.  Gastrointestinal: Positive for nausea. Negative for abdominal pain, constipation, diarrhea and vomiting.  Genitourinary: Negative.  Negative for dysuria and hematuria.  Musculoskeletal: Negative.  Negative for back pain.  Skin: Negative.  Negative for rash.  Neurological: Negative.  Negative for sensory change, focal weakness, weakness and headaches.  Psychiatric/Behavioral: Negative.  The patient is not nervous/anxious.     As per HPI. Otherwise, a complete review of systems is negative.  PAST MEDICAL HISTORY: Past Medical History:  Diagnosis Date  . Hyperlipidemia   . Hypertension   . Renal disorder     PAST SURGICAL HISTORY: Past Surgical History:  Procedure Laterality Date  . CORONARY STENT PLACEMENT    . IR FLUORO GUIDE CV LINE RIGHT  04/03/2018  .  kidney removed Left 2013    FAMILY HISTORY: Family History  Problem Relation Age of Onset  . Coronary artery disease David Rich   . Anemia David Rich   . Kidney failure David Rich   . Subarachnoid hemorrhage David Rich   . Breast cancer David Rich 62  . Hypertension David Rich     ADVANCED DIRECTIVES (Y/N):  N  HEALTH MAINTENANCE: Social History   Tobacco Use  . Smoking status: Former Smoker    Packs/day: 1.00    Years: 55.00    Pack years: 55.00    Types: Cigarettes    Last attempt to quit: 03/20/2012    Years since quitting: 6.2  . Smokeless tobacco: Never Used  Substance Use Topics  . Alcohol use: Yes    Comment: Social  . Drug use: No     Colonoscopy:  PAP:  Bone density:  Lipid panel:  Allergies  Allergen Reactions  . Clopidogrel Rash and Hives  . Rosuvastatin Calcium Anaphylaxis  . Etodolac Nausea And Vomiting and Other (See Comments)    Bad dreams Bad dreams   . Hydrocodone Other (See Comments)    Bad dreams Bad dreams   . Other   . Statins   . Meloxicam Rash    Bad dreams Bad dreams   . Pravastatin Sodium Rash    Current Outpatient Medications  Medication Sig Dispense Refill  . acetaminophen (TYLENOL) 325 MG tablet Take by mouth.    . ALPRAZolam (XANAX) 0.25 MG tablet Take 1 tablet (0.25 mg total) by mouth every 8 (eight) hours as needed for anxiety or sleep. 30 tablet 0  . amLODipine (NORVASC) 2.5 MG tablet Take 1 tablet (2.5  mg total) by mouth daily. 7 tablet 0  . aspirin EC 81 MG tablet Take by mouth.    . Garlic 211 MG TABS Take by mouth.     . lidocaine-prilocaine (EMLA) cream Apply to affected area once 30 g 3  . metoCLOPramide (REGLAN) 10 MG tablet Take 1 tablet (10 mg total) by mouth 4 (four) times daily. 60 tablet 1  . metoprolol tartrate (LOPRESSOR) 25 MG tablet Take 25 mg by mouth.     . Multiple Vitamin (MULTI-VITAMINS) TABS Take by mouth.    . Omega-3 Fatty Acids (FISH OIL OMEGA-3) 1000 MG CAPS Take by mouth daily.    [name] omeprazole (PRILOSEC) 20 MG  capsule Take 1 capsule (20 mg total) by mouth daily. 30 capsule 3  . ondansetron (ZOFRAN) 8 MG tablet Take 1 tablet (8 mg total) by mouth 2 (two) times daily as needed. 30 tablet 2  . prochlorperazine (COMPAZINE) 10 MG tablet Take 1 tablet (10 mg total) by mouth every 6 (six) hours as needed (Nausea or vomiting). 60 tablet 2  . promethazine (PHENERGAN) 25 MG tablet Take 1 tablet (25 mg total) by mouth every 6 (six) hours as needed for nausea or vomiting. 30 tablet 2   No current facility-administered medications for this visit.     OBJECTIVE: Vitals:   06/10/18 0835 06/10/18 0845  BP:  132/75  Pulse:  64  Resp: 12   Temp:  98 F (36.7 C)     Body mass index is 26.5 kg/m.    ECOG FS:0 - Asymptomatic  General: Well-developed, well-nourished, no acute distress. Eyes: Pink conjunctiva, anicteric sclera. HEENT: Normocephalic, moist mucous membranes. Lungs: Clear to auscultation bilaterally. Heart: Regular rate and rhythm. No rubs, murmurs, or gallops. Abdomen: Soft, nontender, nondistended. No organomegaly noted, normoactive bowel sounds. Musculoskeletal: No edema, cyanosis, or clubbing. Neuro: Alert, answering all questions appropriately. Cranial nerves grossly intact. Skin: No rashes or petechiae noted. Psych: Normal affect.  LAB RESULTS:  Lab Results  Component Value Date   NA 136 06/10/2018   K 3.8 06/10/2018   CL 104 06/10/2018   CO2 23 06/10/2018   GLUCOSE 128 (H) 06/10/2018   BUN 15 06/10/2018   CREATININE 1.18 06/10/2018   CALCIUM 8.8 (L) 06/10/2018   PROT 7.0 06/10/2018   ALBUMIN 3.9 06/10/2018   AST 21 06/10/2018   ALT 17 06/10/2018   ALKPHOS 87 06/10/2018   BILITOT 0.6 06/10/2018   GFRNONAA >60 06/10/2018   GFRAA >60 06/10/2018    Lab Results  Component Value Date   WBC 5.5 06/10/2018   NEUTROABS 2.9 06/10/2018   HGB 10.1 (L) 06/10/2018   HCT 29.0 (L) 06/10/2018   MCV 94.9 06/10/2018   PLT 223 06/10/2018     STUDIES: No results  found.  ASSESSMENT: Stage IVa urothelial carcinoma with left supraclavicular lymph node metastasis.  PLAN:    1. 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  PET scan results from March 25, 2018 reviewed independently confirming stage. Patient will receive gemcitabine and cisplatin on day 1 and 8 and have day 15 off.  Because patient has had previous nephrectomy, will use a split dose of cisplatin.  This will be a 21-day cycle.  Gemcitabine has been dose reduced secondary to persistent nausea and fatigue.  Given patient's persistent nausea, will discontinue treatment at this time and forego cycle 4.  Patient also has a trip in 2 weeks that he wishes  to feel good for.  Return to clinic in 3 weeks for further evaluation.  Repeat PET scan 1 to 2 days prior to next appointment.   2.  Genetic testing: Referral has been made to genetics and is pending at time of dictation. 3.  Kidney function: Creatinine remains stable at 1.18.  Monitor closely given his history of nephrectomy and using cisplatin. 4.  Leukopenia: Resolved. 5.  Nausea: Chronic.  Patient was given a prescription for Reglan every 6 hours.  He has been instructed to use Phenergan, Compazine, and Zofran as needed.  Discontinue treatment as above.   Patient expressed understanding and was in agreement with this plan. He also understands that He can call clinic at any time with any questions, concerns, or complaints.   Cancer Staging Urothelial carcinoma of bladder [facility]) Staging form: Urinary Bladder, AJCC 8th Edition - Clinical: Stage IVA Laurier Nancy, cN2, cM1a) - Signed by [name], MD on 04/09/2018   [name], MD   06/12/2018 1:34 PM

## 2018-06-10 ENCOUNTER — Other Ambulatory Visit: Payer: Self-pay

## 2018-06-10 ENCOUNTER — Inpatient Hospital Stay: Payer: Medicare Other

## 2018-06-10 ENCOUNTER — Encounter: Payer: Self-pay | Admitting: Oncology

## 2018-06-10 ENCOUNTER — Inpatient Hospital Stay: Payer: Medicare Other | Attending: Oncology

## 2018-06-10 ENCOUNTER — Inpatient Hospital Stay (HOSPITAL_BASED_OUTPATIENT_CLINIC_OR_DEPARTMENT_OTHER): Payer: Medicare Other | Admitting: Oncology

## 2018-06-10 VITALS — BP 132/75 | HR 64 | Temp 98.0°F | Resp 12 | Ht 72.0 in | Wt 195.4 lb

## 2018-06-10 DIAGNOSIS — C679 Malignant neoplasm of bladder, unspecified: Secondary | ICD-10-CM

## 2018-06-10 DIAGNOSIS — R110 Nausea: Secondary | ICD-10-CM | POA: Insufficient documentation

## 2018-06-10 DIAGNOSIS — C770 Secondary and unspecified malignant neoplasm of lymph nodes of head, face and neck: Secondary | ICD-10-CM | POA: Diagnosis not present

## 2018-06-10 DIAGNOSIS — Z87891 Personal history of nicotine dependence: Secondary | ICD-10-CM

## 2018-06-10 DIAGNOSIS — I10 Essential (primary) hypertension: Secondary | ICD-10-CM | POA: Insufficient documentation

## 2018-06-10 DIAGNOSIS — Z803 Family history of malignant neoplasm of breast: Secondary | ICD-10-CM

## 2018-06-10 DIAGNOSIS — Z905 Acquired absence of kidney: Secondary | ICD-10-CM | POA: Diagnosis not present

## 2018-06-10 LAB — COMPREHENSIVE METABOLIC PANEL
ALBUMIN: 3.9 g/dL (ref 3.5–5.0)
ALK PHOS: 87 U/L (ref 38–126)
ALT: 17 U/L (ref 0–44)
ANION GAP: 9 (ref 5–15)
AST: 21 U/L (ref 15–41)
BILIRUBIN TOTAL: 0.6 mg/dL (ref 0.3–1.2)
BUN: 15 mg/dL (ref 8–23)
CALCIUM: 8.8 mg/dL — AB (ref 8.9–10.3)
CO2: 23 mmol/L (ref 22–32)
Chloride: 104 mmol/L (ref 98–111)
Creatinine, Ser: 1.18 mg/dL (ref 0.61–1.24)
GFR calc Af Amer: >60 mL/min (ref >60)
GLUCOSE: 128 mg/dL — AB (ref 70–99)
POTASSIUM: 3.8 mmol/L (ref 3.5–5.1)
Sodium: 136 mmol/L (ref 135–145)
Total Protein: 7 g/dL (ref 6.5–8.1)

## 2018-06-10 LAB — CBC WITH DIFFERENTIAL/PLATELET
BASOS ABS: 0.1 10*3/uL (ref 0–0.1)
BASOS PCT: 1 %
EOS ABS: 0.2 10*3/uL (ref 0–0.7)
EOS PCT: 4 %
HCT: 29 % — ABNORMAL LOW (ref 40.0–52.0)
Hemoglobin: 10.1 g/dL — ABNORMAL LOW (ref 13.0–18.0)
Lymphocytes Relative: 30 %
Lymphs Abs: 1.6 10*3/uL (ref 1.0–3.6)
MCH: 32.9 pg (ref 26.0–34.0)
MCHC: 34.6 g/dL (ref 32.0–36.0)
MCV: 94.9 fL (ref 80.0–100.0)
MONO ABS: 0.8 10*3/uL (ref 0.2–1.0)
Monocytes Relative: 14 %
Neutro Abs: 2.9 10*3/uL (ref 1.4–6.5)
Neutrophils Relative %: 51 %
PLATELETS: 223 10*3/uL (ref 150–440)
RBC: 3.06 MIL/uL — AB (ref 4.40–5.90)
RDW: 17.3 % — AB (ref 11.5–14.5)
WBC: 5.5 10*3/uL (ref 3.8–10.6)

## 2018-06-10 NOTE — Progress Notes (Signed)
Patient here for pretreatment check. He is feeling nauseated and expressed a desire to take a break from treatment.

## 2018-06-12 ENCOUNTER — Other Ambulatory Visit: Payer: Self-pay | Admitting: *Deleted

## 2018-06-12 DIAGNOSIS — C679 Malignant neoplasm of bladder, unspecified: Secondary | ICD-10-CM

## 2018-06-25 ENCOUNTER — Encounter: Payer: Medicare Other | Admitting: Genetics

## 2018-06-28 NOTE — Progress Notes (Signed)
[place]  [phone] [phone]  ID: David Rich OB: 1946/05/06  MR#: [id number]  XFG#:[id number]  Patient Care Team: Juluis Pitch, MD as PCP - General (Family Medicine)  CHIEF COMPLAINT: Stage IVa urothelial carcinoma with left supraclavicular lymph node metastasis.  INTERVAL HISTORY: Patient returns to clinic today for further evaluation and discussion of his imaging results.  He currently feels well and is nearly back to his baseline.  He has no neurologic complaints.  He denies any recent fevers or illnesses.  He has a fair appetite and denies weight loss.  He has no chest pain or shortness of breath. He denies any nausea, vomiting, constipation, or diarrhea.  He has no urinary complaints.  Patient offers no specific complaints today.  REVIEW OF SYSTEMS:   Review of Systems  Constitutional: Negative.  Negative for fever, malaise/fatigue and weight loss.  Respiratory: Negative.  Negative for cough and shortness of breath.   Cardiovascular: Negative.  Negative for chest pain and leg swelling.  Gastrointestinal: Negative.  Negative for abdominal pain, constipation, diarrhea, nausea and vomiting.  Genitourinary: Negative.  Negative for dysuria and hematuria.  Musculoskeletal: Negative.  Negative for back pain.  Skin: Negative.  Negative for rash.  Neurological: Negative.  Negative for sensory change, focal weakness, weakness and headaches.  Psychiatric/Behavioral: Negative.  The patient is not nervous/anxious.     As per HPI. Otherwise, a complete review of systems is negative.  PAST MEDICAL HISTORY: Past Medical History:  Diagnosis Date  . Hyperlipidemia   . Hypertension   . Renal disorder     PAST SURGICAL HISTORY: Past Surgical History:  Procedure Laterality Date  . CORONARY STENT PLACEMENT    . IR FLUORO GUIDE CV LINE RIGHT  04/03/2018  . kidney removed Left 2013    FAMILY HISTORY: Family History  Problem Relation Age  of Onset  . Coronary artery disease Mother   . Anemia Mother   . Kidney failure Mother   . Subarachnoid hemorrhage Father   . Breast cancer Sister 23  . Hypertension Brother     ADVANCED DIRECTIVES (Y/N):  N  HEALTH MAINTENANCE: Social History   Tobacco Use  . Smoking status: Former Smoker    Packs/day: 1.00    Years: 55.00    Pack years: 55.00    Types: Cigarettes    Last attempt to quit: 03/20/2012    Years since quitting: 6.2  . Smokeless tobacco: Never Used  Substance Use Topics  . Alcohol use: Yes    Comment: Social  . Drug use: No     Colonoscopy:  PAP:  Bone density:  Lipid panel:  Allergies  Allergen Reactions  . Clopidogrel Rash and Hives  . Rosuvastatin Calcium Anaphylaxis  . Etodolac Nausea And Vomiting and Other (See Comments)    Bad dreams Bad dreams   . Hydrocodone Other (See Comments)    Bad dreams Bad dreams   . Other   . Statins   . Meloxicam Rash    Bad dreams Bad dreams   . Pravastatin Sodium Rash    Current Outpatient Medications  Medication Sig Dispense Refill  . amLODipine (NORVASC) 2.5 MG tablet Take 1 tablet (2.5 mg total) by mouth daily. 7 tablet 0  . aspirin EC 81 MG tablet Take by mouth.    . lidocaine-prilocaine (EMLA) cream Apply to affected area once 30 g 3  . metoCLOPramide (REGLAN) 10 MG tablet Take 1 tablet (10 mg total) by mouth 4 (four) times daily.  60 tablet 1  . metoprolol tartrate (LOPRESSOR) 25 MG tablet Take 25 mg by mouth.     [name] acetaminophen (TYLENOL) 325 MG tablet Take by mouth.    . ALPRAZolam (XANAX) 0.25 MG tablet Take 1 tablet (0.25 mg total) by mouth every 8 (eight) hours as needed for anxiety or sleep. (Patient not taking: Reported on 07/01/2018) 30 tablet 0  . Garlic 696 MG TABS Take by mouth.     . Multiple Vitamin (MULTI-VITAMINS) TABS Take by mouth.    . Omega-3 Fatty Acids (FISH OIL OMEGA-3) 1000 MG CAPS Take by mouth daily.    [name] omeprazole (PRILOSEC) 20 MG capsule Take 1 capsule (20 mg total) by  mouth daily. (Patient not taking: Reported on 07/01/2018) 30 capsule 3  . ondansetron (ZOFRAN) 8 MG tablet Take 1 tablet (8 mg total) by mouth 2 (two) times daily as needed. (Patient not taking: Reported on 07/01/2018) 30 tablet 2  . prochlorperazine (COMPAZINE) 10 MG tablet Take 1 tablet (10 mg total) by mouth every 6 (six) hours as needed (Nausea or vomiting). (Patient not taking: Reported on 07/01/2018) 60 tablet 2  . promethazine (PHENERGAN) 25 MG tablet Take 1 tablet (25 mg total) by mouth every 6 (six) hours as needed for nausea or vomiting. (Patient not taking: Reported on 07/01/2018) 30 tablet 2   No current facility-administered medications for this visit.     OBJECTIVE: Vitals:   07/01/18 0954  BP: (!) 158/90  Pulse: 79  Resp: 18  Temp: (!) 97.1 F (36.2 C)     Body mass index is 27.23 kg/m.    ECOG FS:0 - Asymptomatic  General: Well-developed, well-nourished, no acute distress. Eyes: Pink conjunctiva, anicteric sclera. HEENT: Normocephalic, moist mucous membranes. Lungs: Clear to auscultation bilaterally. Heart: Regular rate and rhythm. No rubs, murmurs, or gallops. Abdomen: Soft, nontender, nondistended. No organomegaly noted, normoactive bowel sounds. Musculoskeletal: No edema, cyanosis, or clubbing. Neuro: Alert, answering all questions appropriately. Cranial nerves grossly intact. Skin: No rashes or petechiae noted. Psych: Normal affect.  LAB RESULTS:  Lab Results  Component Value Date   NA 140 07/01/2018   K 4.3 07/01/2018   CL 107 07/01/2018   CO2 25 07/01/2018   GLUCOSE 132 (H) 07/01/2018   BUN 15 07/01/2018   CREATININE 1.25 (H) 07/01/2018   CALCIUM 9.0 07/01/2018   PROT 7.0 07/01/2018   ALBUMIN 4.0 07/01/2018   AST 18 07/01/2018   ALT 15 07/01/2018   ALKPHOS 81 07/01/2018   BILITOT 0.5 07/01/2018   GFRNONAA 57 (L) 07/01/2018   GFRAA >60 07/01/2018    Lab Results  Component Value Date   WBC 7.1 07/01/2018   NEUTROABS 4.5 07/01/2018   HGB 11.8 (L)  07/01/2018   HCT 34.2 (L) 07/01/2018   MCV 96.7 07/01/2018   PLT 189 07/01/2018     STUDIES: Nm Pet Image Restag (ps) Skull Base To Thigh  Result Date: 06/29/2018 CLINICAL DATA:  Subsequent treatment strategy for uroepithelial carcinoma of the bladder. EXAM: NUCLEAR MEDICINE PET SKULL BASE TO THIGH TECHNIQUE: 10.0 mCi F-18 FDG was injected intravenously. Full-ring PET imaging was performed from the skull base to thigh after the radiotracer. CT data was obtained and used for attenuation correction and anatomic localization. Fasting blood glucose: 85 mg/dl COMPARISON:  03/25/2018 FINDINGS: Mediastinal blood pool activity: SUV max 2.1 NECK: Low left cervical/supraclavicular node measures 11 mm and a S.U.V. max of 3.1 today versus similar in size and a S.U.V. max of 9.7 on the prior. Incidental  CT findings: No cervical adenopathy. CHEST: A right retrocrural node measures 7 mm and a S.U.V. max of 2.6 today versus similar in size and a S.U.V. max of 5.3 on the prior. No new cervical nodal hypermetabolism. Incidental CT findings: Right Port-A-Cath tip at superior caval/atrial junction. Multivessel coronary artery atherosclerosis. Tiny hiatal hernia. Paraseptal emphysema. ABDOMEN/PELVIS: Left periaortic node measures 1.5 cm and a S.U.V. max of 12.6 today versus 1.8 cm and a S.U.V. max of 21.7 on the prior. Right external iliac node measures 8 mm and a S.U.V. max of 5.0 today versus similar in size and a S.U.V. max of 5.3 on the prior. Incidental CT findings: Aortic atherosclerosis. Minimal calcification in the nondependent gallbladder could represent adherent stone or gallbladder wall parenchymal calcification. Normal adrenal glands. Extensive colonic diverticulosis. Mild prostatomegaly. Penile implant. SKELETON: No abnormal marrow activity. Incidental CT findings: none IMPRESSION: 1. Response to therapy of nodal metastasis within the neck, chest, abdomen, and pelvis. 2. No new sites of disease identified. 3.  Coronary artery atherosclerosis. Aortic Atherosclerosis (ICD10-I70.0). Electronically Signed   By: Abigail Miyamoto M.D.   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  PLAN:    1. 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  PET scan results from June 29, 2018 reviewed independently and report as above with significant response to therapy.  Patient received his last dose of cisplatin and gemcitabine on May 27, 2018.  Because patient had previous nephrectomy, he was given a split dose of cisplatin.  No intervention is needed at this time.  Given patient's problem with persistent nausea with cisplatin and gemcitabine, can consider immunotherapy as second line treatment if patient has a recurrence.  Return to clinic in 3 months with repeat imaging using CT scan and further evaluation.   2.  Genetic testing: Referral has been made to genetics and is pending at time of dictation. 3.  Kidney function: Creatinine is slightly elevated at 1.25.  Continue to monitor closely.   4.  Leukopenia: Resolved. 5.  Nausea: Resolved.  Consider immunotherapy if patient has a recurrence.  Patient expressed understanding and was in agreement with this plan. He also understands that He can call clinic at any time with any questions, concerns, or complaints.   Cancer Staging Urothelial carcinoma of bladder [facility]) Staging form: Urinary Bladder, AJCC 8th Edition - Clinical: Stage IVA [place] Nancy, cN2, cM1a) - Signed by [name], MD on 04/09/2018   [name], MD   07/05/2018 7:22 AM

## 2018-06-29 ENCOUNTER — Encounter
Admission: RE | Admit: 2018-06-29 | Discharge: 2018-06-29 | Disposition: A | Discharge status: Home / Self Care | Payer: Medicare Other | Source: Ambulatory Visit | Attending: Oncology | Admitting: Oncology

## 2018-06-29 DIAGNOSIS — C679 Malignant neoplasm of bladder, unspecified: Secondary | ICD-10-CM

## 2018-06-29 LAB — GLUCOSE, CAPILLARY: Glucose-Capillary: 85 mg/dL (ref 70–99)

## 2018-06-29 MED ORDER — FLUDEOXYGLUCOSE F - 18 (FDG) INJECTION
10.1000 | Freq: Once | INTRAVENOUS | Status: AC | PRN
  Administered 2018-06-29: 9.99 via INTRAVENOUS

## 2018-07-01 ENCOUNTER — Other Ambulatory Visit: Payer: Self-pay

## 2018-07-01 ENCOUNTER — Inpatient Hospital Stay (HOSPITAL_BASED_OUTPATIENT_CLINIC_OR_DEPARTMENT_OTHER): Payer: Medicare Other | Admitting: Oncology

## 2018-07-01 ENCOUNTER — Inpatient Hospital Stay: Payer: Medicare Other

## 2018-07-01 VITALS — BP 158/90 | HR 79 | Temp 97.1°F | Resp 18 | Wt 200.8 lb

## 2018-07-01 DIAGNOSIS — I10 Essential (primary) hypertension: Secondary | ICD-10-CM | POA: Diagnosis not present

## 2018-07-01 DIAGNOSIS — C679 Malignant neoplasm of bladder, unspecified: Secondary | ICD-10-CM

## 2018-07-01 DIAGNOSIS — C770 Secondary and unspecified malignant neoplasm of lymph nodes of head, face and neck: Secondary | ICD-10-CM

## 2018-07-01 DIAGNOSIS — Z803 Family history of malignant neoplasm of breast: Secondary | ICD-10-CM

## 2018-07-01 DIAGNOSIS — Z905 Acquired absence of kidney: Secondary | ICD-10-CM

## 2018-07-01 DIAGNOSIS — Z87891 Personal history of nicotine dependence: Secondary | ICD-10-CM

## 2018-07-01 LAB — CBC WITH DIFFERENTIAL/PLATELET
BASOS ABS: 0.1 10*3/uL (ref 0–0.1)
BASOS PCT: 1 %
Eosinophils Absolute: 0.3 10*3/uL (ref 0–0.7)
Eosinophils Relative: 4 %
HEMATOCRIT: 34.2 % — AB (ref 40.0–52.0)
Hemoglobin: 11.8 g/dL — ABNORMAL LOW (ref 13.0–18.0)
Lymphocytes Relative: 22 %
Lymphs Abs: 1.6 10*3/uL (ref 1.0–3.6)
MCH: 33.5 pg (ref 26.0–34.0)
MCHC: 34.6 g/dL (ref 32.0–36.0)
MCV: 96.7 fL (ref 80.0–100.0)
MONO ABS: 0.7 10*3/uL (ref 0.2–1.0)
Monocytes Relative: 10 %
NEUTROS ABS: 4.5 10*3/uL (ref 1.4–6.5)
NEUTROS PCT: 63 %
Platelets: 189 10*3/uL (ref 150–440)
RBC: 3.53 MIL/uL — ABNORMAL LOW (ref 4.40–5.90)
RDW: 18.8 % — AB (ref 11.5–14.5)
WBC: 7.1 10*3/uL (ref 3.8–10.6)

## 2018-07-01 LAB — COMPREHENSIVE METABOLIC PANEL
ALBUMIN: 4 g/dL (ref 3.5–5.0)
ALT: 15 U/L (ref 0–44)
AST: 18 U/L (ref 15–41)
Alkaline Phosphatase: 81 U/L (ref 38–126)
Anion gap: 8 (ref 5–15)
BILIRUBIN TOTAL: 0.5 mg/dL (ref 0.3–1.2)
BUN: 15 mg/dL (ref 8–23)
CO2: 25 mmol/L (ref 22–32)
Calcium: 9 mg/dL (ref 8.9–10.3)
Chloride: 107 mmol/L (ref 98–111)
Creatinine, Ser: 1.25 mg/dL — ABNORMAL HIGH (ref 0.61–1.24)
GFR calc Af Amer: >60 mL/min (ref >60)
GFR calc non Af Amer: 57 mL/min — ABNORMAL LOW (ref >60)
GLUCOSE: 132 mg/dL — AB (ref 70–99)
POTASSIUM: 4.3 mmol/L (ref 3.5–5.1)
Sodium: 140 mmol/L (ref 135–145)
Total Protein: 7 g/dL (ref 6.5–8.1)

## 2018-07-01 NOTE — Progress Notes (Signed)
Here for follow up. Per pt feeling "weak, tired, sleeping more than usual."

## 2018-07-13 ENCOUNTER — Encounter: Payer: Self-pay | Admitting: Oncology

## 2018-07-23 ENCOUNTER — Encounter: Payer: Self-pay | Admitting: Genetics

## 2018-07-23 ENCOUNTER — Inpatient Hospital Stay: Payer: Medicare Other

## 2018-07-23 ENCOUNTER — Inpatient Hospital Stay: Payer: Medicare Other | Attending: Hematology and Oncology | Admitting: Genetics

## 2018-07-23 DIAGNOSIS — C679 Malignant neoplasm of bladder, unspecified: Secondary | ICD-10-CM

## 2018-07-23 DIAGNOSIS — Z1379 Encounter for other screening for genetic and chromosomal anomalies: Secondary | ICD-10-CM | POA: Insufficient documentation

## 2018-07-23 LAB — COMPREHENSIVE METABOLIC PANEL
ALK PHOS: 73 U/L (ref 38–126)
ALT: 24 U/L (ref 0–44)
ANION GAP: 7 (ref 5–15)
AST: 22 U/L (ref 15–41)
Albumin: 4.1 g/dL (ref 3.5–5.0)
BILIRUBIN TOTAL: 0.6 mg/dL (ref 0.3–1.2)
BUN: 17 mg/dL (ref 8–23)
CALCIUM: 9.1 mg/dL (ref 8.9–10.3)
CO2: 24 mmol/L (ref 22–32)
Chloride: 108 mmol/L (ref 98–111)
Creatinine, Ser: 1.1 mg/dL (ref 0.61–1.24)
GLUCOSE: 132 mg/dL — AB (ref 70–99)
Potassium: 3.7 mmol/L (ref 3.5–5.1)
Sodium: 139 mmol/L (ref 135–145)
TOTAL PROTEIN: 7.1 g/dL (ref 6.5–8.1)

## 2018-07-23 LAB — CBC WITH DIFFERENTIAL/PLATELET
Abs Immature Granulocytes: 0.02 10*3/uL (ref 0.00–0.07)
BASOS PCT: 1 %
Basophils Absolute: 0 10*3/uL (ref 0.0–0.1)
EOS ABS: 0.3 10*3/uL (ref 0.0–0.5)
Eosinophils Relative: 4 %
HCT: 38.5 % — ABNORMAL LOW (ref 39.0–52.0)
Hemoglobin: 12.4 g/dL — ABNORMAL LOW (ref 13.0–17.0)
IMMATURE GRANULOCYTES: 0 %
Lymphocytes Relative: 28 %
Lymphs Abs: 2 10*3/uL (ref 0.7–4.0)
MCH: 31.9 pg (ref 26.0–34.0)
MCHC: 32.2 g/dL (ref 30.0–36.0)
MCV: 99 fL (ref 80.0–100.0)
MONOS PCT: 8 %
Monocytes Absolute: 0.6 10*3/uL (ref 0.1–1.0)
NEUTROS PCT: 59 %
NRBC: 0 % (ref 0.0–0.2)
Neutro Abs: 4.2 10*3/uL (ref 1.7–7.7)
PLATELETS: 174 10*3/uL (ref 150–400)
RBC: 3.89 MIL/uL — AB (ref 4.22–5.81)
RDW: 14.7 % (ref 11.5–15.5)
WBC: 7.2 10*3/uL (ref 4.0–10.5)

## 2018-07-23 MED ORDER — HEPARIN SOD (PORK) LOCK FLUSH 100 UNIT/ML IV SOLN
500.0000 [IU] | Freq: Once | INTRAVENOUS | Status: AC
  Administered 2018-07-23: 500 [IU] via INTRAVENOUS

## 2018-07-23 MED ORDER — SODIUM CHLORIDE 0.9% FLUSH
10.0000 mL | Freq: Once | INTRAVENOUS | Status: AC
  Administered 2018-07-23: 10 mL via INTRAVENOUS
  Filled 2018-07-23: qty 10

## 2018-09-26 ENCOUNTER — Other Ambulatory Visit: Payer: Self-pay

## 2018-09-26 ENCOUNTER — Emergency Department: Payer: Medicare Other

## 2018-09-26 ENCOUNTER — Inpatient Hospital Stay
Admission: EM | Admit: 2018-09-26 | Discharge: 2018-09-29 | DRG: 247 | Disposition: A | Discharge status: Home / Self Care | Payer: Medicare Other | Attending: Family Medicine | Admitting: Family Medicine

## 2018-09-26 DIAGNOSIS — E782 Mixed hyperlipidemia: Secondary | ICD-10-CM | POA: Diagnosis present

## 2018-09-26 DIAGNOSIS — Z8551 Personal history of malignant neoplasm of bladder: Secondary | ICD-10-CM

## 2018-09-26 DIAGNOSIS — N183 Chronic kidney disease, stage 3 (moderate): Secondary | ICD-10-CM | POA: Diagnosis present

## 2018-09-26 DIAGNOSIS — Z79899 Other long term (current) drug therapy: Secondary | ICD-10-CM

## 2018-09-26 DIAGNOSIS — I214 Non-ST elevation (NSTEMI) myocardial infarction: Principal | ICD-10-CM | POA: Diagnosis present

## 2018-09-26 DIAGNOSIS — R0789 Other chest pain: Secondary | ICD-10-CM | POA: Diagnosis not present

## 2018-09-26 DIAGNOSIS — Z8249 Family history of ischemic heart disease and other diseases of the circulatory system: Secondary | ICD-10-CM

## 2018-09-26 DIAGNOSIS — I129 Hypertensive chronic kidney disease with stage 1 through stage 4 chronic kidney disease, or unspecified chronic kidney disease: Secondary | ICD-10-CM | POA: Diagnosis present

## 2018-09-26 DIAGNOSIS — Z87891 Personal history of nicotine dependence: Secondary | ICD-10-CM

## 2018-09-26 DIAGNOSIS — I2510 Atherosclerotic heart disease of native coronary artery without angina pectoris: Secondary | ICD-10-CM | POA: Diagnosis present

## 2018-09-26 DIAGNOSIS — Z7982 Long term (current) use of aspirin: Secondary | ICD-10-CM

## 2018-09-26 DIAGNOSIS — R778 Other specified abnormalities of plasma proteins: Secondary | ICD-10-CM

## 2018-09-26 DIAGNOSIS — R079 Chest pain, unspecified: Secondary | ICD-10-CM | POA: Diagnosis present

## 2018-09-26 DIAGNOSIS — Z888 Allergy status to other drugs, medicaments and biological substances status: Secondary | ICD-10-CM

## 2018-09-26 DIAGNOSIS — Z955 Presence of coronary angioplasty implant and graft: Secondary | ICD-10-CM

## 2018-09-26 DIAGNOSIS — Z9221 Personal history of antineoplastic chemotherapy: Secondary | ICD-10-CM

## 2018-09-26 DIAGNOSIS — I252 Old myocardial infarction: Secondary | ICD-10-CM

## 2018-09-26 DIAGNOSIS — R7989 Other specified abnormal findings of blood chemistry: Secondary | ICD-10-CM

## 2018-09-26 HISTORY — DX: Malignant (primary) neoplasm, unspecified: C80.1

## 2018-09-26 LAB — BASIC METABOLIC PANEL
ANION GAP: 7 (ref 5–15)
BUN: 18 mg/dL (ref 8–23)
CHLORIDE: 105 mmol/L (ref 98–111)
CO2: 25 mmol/L (ref 22–32)
Calcium: 9.2 mg/dL (ref 8.9–10.3)
Creatinine, Ser: 1.35 mg/dL — ABNORMAL HIGH (ref 0.61–1.24)
GFR calc Af Amer: >60 mL/min (ref >60)
GFR, EST NON AFRICAN AMERICAN: 53 mL/min — AB (ref >60)
Glucose, Bld: 97 mg/dL (ref 70–99)
POTASSIUM: 4 mmol/L (ref 3.5–5.1)
SODIUM: 137 mmol/L (ref 135–145)

## 2018-09-26 LAB — CBC
HCT: 46.1 % (ref 39.0–52.0)
HEMOGLOBIN: 14.9 g/dL (ref 13.0–17.0)
MCH: 31.2 pg (ref 26.0–34.0)
MCHC: 32.3 g/dL (ref 30.0–36.0)
MCV: 96.4 fL (ref 80.0–100.0)
NRBC: 0 % (ref 0.0–0.2)
Platelets: 224 10*3/uL (ref 150–400)
RBC: 4.78 MIL/uL (ref 4.22–5.81)
RDW: 13.4 % (ref 11.5–15.5)
WBC: 10.2 10*3/uL (ref 4.0–10.5)

## 2018-09-26 LAB — TROPONIN I: TROPONIN I: 0.19 ng/mL — AB (ref <0.03)

## 2018-09-26 MED ORDER — ASPIRIN EC 325 MG PO TBEC
325.0000 mg | DELAYED_RELEASE_TABLET | Freq: Every day | ORAL | Status: DC
  Administered 2018-09-27: 325 mg via ORAL
  Filled 2018-09-26: qty 1

## 2018-09-26 MED ORDER — AMLODIPINE BESYLATE 5 MG PO TABS
5.0000 mg | ORAL_TABLET | Freq: Every day | ORAL | Status: DC
  Administered 2018-09-27 – 2018-09-29 (×3): 5 mg via ORAL
  Filled 2018-09-26 (×3): qty 1

## 2018-09-26 MED ORDER — METOCLOPRAMIDE HCL 10 MG PO TABS: 10.0000 mg | ORAL_TABLET | Freq: Four times a day (QID) | ORAL | Status: DC

## 2018-09-26 MED ORDER — ONDANSETRON HCL 4 MG/2ML IJ SOLN: 4.0000 mg | Freq: Four times a day (QID) | INTRAMUSCULAR | Status: DC | PRN

## 2018-09-26 MED ORDER — ASPIRIN 81 MG PO CHEW
243.0000 mg | CHEWABLE_TABLET | Freq: Once | ORAL | Status: AC
  Administered 2018-09-26: 243 mg via ORAL
  Filled 2018-09-26: qty 3

## 2018-09-26 MED ORDER — OMEGA-3-ACID ETHYL ESTERS 1 G PO CAPS
1.0000 | ORAL_CAPSULE | Freq: Every day | ORAL | Status: DC
  Administered 2018-09-27 – 2018-09-29 (×3): 1 g via ORAL
  Filled 2018-09-26 (×3): qty 1

## 2018-09-26 MED ORDER — ALPRAZOLAM 0.25 MG PO TABS: 0.2500 mg | ORAL_TABLET | Freq: Two times a day (BID) | ORAL | Status: DC | PRN

## 2018-09-26 MED ORDER — HEPARIN (PORCINE) 25000 UT/250ML-% IV SOLN
1150.0000 [IU]/h | INTRAVENOUS | Status: DC
  Administered 2018-09-27 (×2): 1200 [IU]/h via INTRAVENOUS
  Filled 2018-09-26 (×2): qty 250

## 2018-09-26 MED ORDER — HEPARIN BOLUS VIA INFUSION
4000.0000 [IU] | Freq: Once | INTRAVENOUS | Status: AC
  Administered 2018-09-27: 4000 [IU] via INTRAVENOUS
  Filled 2018-09-26: qty 4000

## 2018-09-26 MED ORDER — ACETAMINOPHEN 325 MG PO TABS: 325.0000 mg | ORAL_TABLET | ORAL | Status: DC | PRN

## 2018-09-26 MED ORDER — ALUM & MAG HYDROXIDE-SIMETH 200-200-20 MG/5ML PO SUSP
30.0000 mL | Freq: Once | ORAL | Status: AC
  Administered 2018-09-27: 30 mL via ORAL
  Filled 2018-09-26: qty 30

## 2018-09-26 MED ORDER — MORPHINE SULFATE (PF) 2 MG/ML IV SOLN: 2.0000 mg | INTRAVENOUS | Status: DC | PRN

## 2018-09-26 MED ORDER — HYDRALAZINE HCL 20 MG/ML IJ SOLN
10.0000 mg | INTRAMUSCULAR | Status: DC | PRN
  Administered 2018-09-27: 10 mg via INTRAVENOUS
  Filled 2018-09-26: qty 1

## 2018-09-26 MED ORDER — ACETAMINOPHEN 325 MG PO TABS: 650.0000 mg | ORAL_TABLET | ORAL | Status: DC | PRN

## 2018-09-26 MED ORDER — ENOXAPARIN SODIUM 40 MG/0.4ML ~~LOC~~ SOLN: 40.0000 mg | SUBCUTANEOUS | Status: DC

## 2018-09-26 MED ORDER — METOPROLOL TARTRATE 25 MG PO TABS
25.0000 mg | ORAL_TABLET | Freq: Two times a day (BID) | ORAL | Status: DC
  Administered 2018-09-27 – 2018-09-29 (×4): 25 mg via ORAL
  Filled 2018-09-26 (×6): qty 1

## 2018-09-26 NOTE — ED Notes (Signed)
Pt said that he was having center chest pain earlier with left forearm burning. Pt stated that he thinks it heart burn because he took some antiacids and it resolved the pain. Pt denies any chest pain or forearm burning at this time.

## 2018-09-26 NOTE — ED Notes (Addendum)
Dr. Cherylann Banas made aware of trop level.

## 2018-09-26 NOTE — ED Notes (Signed)
Transport to floor room 253.AS

## 2018-09-26 NOTE — Progress Notes (Signed)
Family Meeting Note  Advance Directive:yes  Today a meeting took place with the Patient.  Patient is able to participate  The following clinical team members were present during this meeting:MD  The following were discussed:Patient's diagnosis: Acute chest pain, elevated troponins, history of bladder cancer, Patient's progosis: Unable to determine and Goals for treatment: Full Code  Additional follow-up to be provided: prn  Time spent during discussion:20 minutes  Gorden Harms, MD

## 2018-09-26 NOTE — ED Triage Notes (Signed)
Patient c/o medial chest pain radiating to left arm described as pressure.

## 2018-09-26 NOTE — H&P (Signed)
[place] [facility] [place] NAME: David Rich    MR#:  [id number]  DATE OF BIRTH:  10-14-1945  DATE OF ADMISSION:  09/26/2018  PRIMARY CARE PHYSICIAN: Juluis Pitch, MD   REQUESTING/REFERRING PHYSICIAN:   CHIEF COMPLAINT:   Chief Complaint  Patient presents with  . Chest Pain    HISTORY OF PRESENT ILLNESS: David Rich  is a 72 y.o. male with a known history per below, presenting with acute chest pain radiating into the left arm, not associated with shortness of breath or diaphoresis, in the emergency room patient was noted to be hypertensive, troponin elevation of 0.19, EKG was benign, creatinine 1.3, heparin not initiated given absence of EKG findings per ED attending, patient evaluated in the emergency room, in no apparent distress, resting comfortably in bed, patient does complain of mild chest pain anteriorly, denies any shortness of breath currently, no nausea/emesis, patient is now being admitted for acute chest pain suspicious for non-STEMI.  PAST MEDICAL HISTORY:   Past Medical History:  Diagnosis Date  . Cancer [facility])    prostate  . Hyperlipidemia   . Hypertension   . Renal disorder     PAST SURGICAL HISTORY:  Past Surgical History:  Procedure Laterality Date  . CORONARY STENT PLACEMENT    . IR FLUORO GUIDE CV LINE RIGHT  04/03/2018  . kidney removed Left 2013    SOCIAL HISTORY:  Social History   Tobacco Use  . Smoking status: Former Smoker    Packs/day: 1.00    Years: 55.00    Pack years: 55.00    Types: Cigarettes    Last attempt to quit: 03/20/2012    Years since quitting: 6.5  . Smokeless tobacco: Never Used  Substance Use Topics  . Alcohol use: Yes    Comment: Social    FAMILY HISTORY:  Family History  Problem Relation Age of Onset  . Coronary artery disease Mother   . Anemia Mother   . Kidney failure Mother   . Subarachnoid hemorrhage Father   . Breast cancer Sister 31  . Hypertension Brother      DRUG ALLERGIES:  Allergies  Allergen Reactions  . Clopidogrel Rash and Hives  . Rosuvastatin Calcium Anaphylaxis  . Etodolac Nausea And Vomiting and Other (See Comments)    Bad dreams Bad dreams   . Hydrocodone Other (See Comments)    Bad dreams Bad dreams   . Other   . Statins   . Meloxicam Rash    Bad dreams Bad dreams   . Pravastatin Sodium Rash    REVIEW OF SYSTEMS:   CONSTITUTIONAL: No fever, fatigue or weakness.  EYES: No blurred or double vision.  EARS, NOSE, AND THROAT: No tinnitus or ear pain.  RESPIRATORY: No cough, shortness of breath, wheezing or hemoptysis.  CARDIOVASCULAR: + chest pain, no orthopnea, edema.  GASTROINTESTINAL: No nausea, vomiting, diarrhea or abdominal pain.  GENITOURINARY: No dysuria, hematuria.  ENDOCRINE: No polyuria, nocturia,  HEMATOLOGY: No anemia, easy bruising or bleeding SKIN: No rash or lesion. MUSCULOSKELETAL: No joint pain or arthritis.   NEUROLOGIC: No tingling, numbness, weakness.  PSYCHIATRY: No anxiety or depression.   MEDICATIONS AT HOME:  Prior to Admission medications   Medication Sig Start Date End Date Taking? Authorizing Provider  acetaminophen (TYLENOL) 325 MG tablet Take by mouth. 02/18/18  Yes [provider]  amLODipine (NORVASC) 2.5 MG tablet Take 1 tablet (2.5 mg total) by mouth daily. 07/29/17 09/26/18 Yes Little, Laroy Apple, PA-C  aspirin EC 81 MG tablet Take by mouth.   Yes [provider]  Garlic 176 MG TABS Take by mouth.    Yes [provider]  metoprolol tartrate (LOPRESSOR) 25 MG tablet Take 25 mg by mouth.  09/19/17  Yes [provider]  Multiple Vitamin (MULTI-VITAMINS) TABS Take by mouth.   Yes [provider]  Omega-3 Fatty Acids (FISH OIL OMEGA-3) 1000 MG CAPS Take by mouth daily.   Yes [provider]  lidocaine-prilocaine (EMLA) cream Apply to affected area once Patient not taking: Reported on 09/26/2018 03/30/18   [name], MD   metoCLOPramide (REGLAN) 10 MG tablet Take 1 tablet (10 mg total) by mouth 4 (four) times daily. Patient not taking: Reported on 09/26/2018 05/27/18   [name], MD      PHYSICAL EXAMINATION:   VITAL SIGNS: Blood pressure (!) 153/91, pulse (!) 56, temperature 97.8 F (36.6 C), temperature source Oral, resp. rate 16, height 6' (1.829 m), weight 90.7 kg, SpO2 95 %.  GENERAL:  72 y.o.-year-old patient lying in the bed with no acute distress.  EYES: Pupils equal, round, reactive to light and accommodation. No scleral icterus. Extraocular muscles intact.  HEENT: Head atraumatic, normocephalic. Oropharynx and nasopharynx clear.  NECK:  Supple, no jugular venous distention. No thyroid enlargement, no tenderness.  LUNGS: Normal breath sounds bilaterally, no wheezing, rales,rhonchi or crepitation. No use of accessory muscles of respiration.  CARDIOVASCULAR: S1, S2 normal. No murmurs, rubs, or gallops.  ABDOMEN: Soft, nontender, nondistended. Bowel sounds present. No organomegaly or mass.  EXTREMITIES: No pedal edema, cyanosis, or clubbing.  NEUROLOGIC: Cranial nerves II through XII are intact. Muscle strength 5/5 in all extremities. Sensation intact. Gait not checked.  PSYCHIATRIC: The patient is alert and oriented x 3.  SKIN: No obvious rash, lesion, or ulcer.   LABORATORY PANEL:   CBC Recent Labs  Lab 09/26/18 2042  WBC 10.2  HGB 14.9  HCT 46.1  PLT 224  MCV 96.4  MCH 31.2  MCHC 32.3  RDW 13.4   ------------------------------------------------------------------------------------------------------------------  Chemistries  Recent Labs  Lab 09/26/18 2042  NA 137  K 4.0  CL 105  CO2 25  GLUCOSE 97  BUN 18  CREATININE 1.35*  CALCIUM 9.2   ------------------------------------------------------------------------------------------------------------------ estimated creatinine clearance is 55.9 mL/min (A) (by C-G formula based on SCr of 1.35 mg/dL  (H)). ------------------------------------------------------------------------------------------------------------------ No results for input(s): TSH, T4TOTAL, T3FREE, THYROIDAB in the last 72 hours.  Invalid input(s): FREET3   Coagulation profile No results for input(s): INR, PROTIME in the last 168 hours. ------------------------------------------------------------------------------------------------------------------- No results for input(s): DDIMER in the last 72 hours. -------------------------------------------------------------------------------------------------------------------  Cardiac Enzymes Recent Labs  Lab 09/26/18 2042  TROPONINI 0.19*   ------------------------------------------------------------------------------------------------------------------ Invalid input(s): POCBNP  ---------------------------------------------------------------------------------------------------------------  Urinalysis No results found for: COLORURINE, APPEARANCEUR, LABSPEC, PHURINE, GLUCOSEU, HGBUR, BILIRUBINUR, KETONESUR, PROTEINUR, UROBILINOGEN, NITRITE, LEUKOCYTESUR   RADIOLOGY: Dg Chest 2 View  Result Date: 09/26/2018 CLINICAL DATA:  Acute onset of generalized chest discomfort and left forearm pain. Personal history of bladder cancer. EXAM: CHEST - 2 VIEW COMPARISON:  PET/CT performed 06/29/2018 FINDINGS: The lungs are well-aerated. Mild scarring is noted at the lung apices. There is no evidence of focal opacification, pleural effusion or pneumothorax. A right-sided chest port is noted ending about the distal SVC. The heart is normal in size; the mediastinal contour is within normal limits. No acute osseous abnormalities are seen. IMPRESSION: No acute cardiopulmonary process seen. Electronically Signed   By: Francoise Schaumann.D.  On: 09/26/2018 21:11    EKG: Orders placed or performed during the hospital encounter of 09/26/18  . EKG 12-Lead  . EKG 12-Lead  . ED EKG within 10  minutes  . ED EKG within 10 minutes    IMPRESSION AND PLAN: *Acute possible non-STEMI with history of CAD/coronary artery stenting x1, elevated troponins, continued chest pain Admit to telemetry floor on our ACS protocol, aspirin, statin therapy, Lopressor, nitrates as needed, IV morphine PRN breakthrough pain, supplemental oxygen, heparin drip, cardiology to see, check echocardiogram, continue to cycle cardiac enzymes, and continue close medical monitoring  *Chronic hypertension Uncontrolled Increase Norvasc, continue beta-blocker therapy, IV hydralazine as needed systolic blood pressure greater than 160, vitals per routine, make changes as per necessary  *Chronic hyperlipidemia, unspecified Statin therapy and check lipids in the morning  *Chronic kidney disease stage III At baseline Avoid nephrotoxic agents   All the records are reviewed and case discussed with ED provider. Management plans discussed with the patient, family and they are in agreement.  CODE STATUS:full  TOTAL TIME TAKING CARE OF THIS PATIENT: 40 minutes.    Avel Peace Salary M.D on 09/26/2018   Between 7am to 6pm - Pager - [phone]  After 6pm go to [email or web address] - password [email or web address] [facility]  Office  [phone]  CC: Primary care physician; Juluis Pitch, MD   Note: This dictation was prepared with Dragon dictation along with smaller phrase technology. Any transcriptional errors that result from this process are unintentional.

## 2018-09-26 NOTE — ED Provider Notes (Signed)
[facility] Emergency Department Provider Note ____________________________________________   First MD Initiated Contact with Patient 09/26/18 2124     (approximate)  I have reviewed the triage vital signs and the nursing notes.   HISTORY  Chief Complaint Chest Pain    HPI David Rich is a 72 y.o. male with PMH as noted below including CAD status post stenting several years ago who presents with chest pain, acute onset this evening around 6 PM after he ate some steak, described as pressure-like, and associated with discomfort and tingling in his left arm.  The patient states that he has had similar pain over the last few days which she attributed to indigestion, but came in today because of the radiation to the arm.  He states that the pain has now resolved.  He denies associated shortness of breath, lightheadedness, or nausea.  He states that he takes a baby aspirin every day.  Past Medical History:  Diagnosis Date  . Cancer [facility])    prostate  . Hyperlipidemia   . Hypertension   . Renal disorder     Patient Active Problem List   Diagnosis Date Noted  . Chest pain 09/26/2018  . Genetic testing 07/23/2018  . Goals of care, counseling/discussion 03/30/2018  . 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 HYPERCHOLESTEROLEMIA 01/14/2009  . TOBACCO ABUSE 01/14/2009  . ANGINA, UNSTABLE 01/14/2009  . CAD 01/14/2009  . CONGESTIVE HEART FAILURE, HX OF 01/14/2009    Past Surgical History:  Procedure Laterality Date  . CORONARY STENT PLACEMENT    . IR FLUORO GUIDE CV LINE RIGHT  04/03/2018  . kidney removed Left 2013    Prior to Admission medications   Medication Sig Start Date End Date Taking? Authorizing Provider  acetaminophen (TYLENOL) 325 MG tablet Take by mouth. 02/18/18  Yes [provider]  amLODipine (NORVASC) 2.5 MG tablet Take 1 tablet (2.5 mg total) by mouth daily. 07/29/17 09/26/18 Yes Little, Traci M, PA-C  aspirin EC  81 MG tablet Take by mouth.   Yes [provider]  Garlic 599 MG TABS Take by mouth.    Yes [provider]  metoprolol tartrate (LOPRESSOR) 25 MG tablet Take 25 mg by mouth.  09/19/17  Yes [provider]  Multiple Vitamin (MULTI-VITAMINS) TABS Take by mouth.   Yes [provider]  Omega-3 Fatty Acids (FISH OIL OMEGA-3) 1000 MG CAPS Take by mouth daily.   Yes [provider]  lidocaine-prilocaine (EMLA) cream Apply to affected area once Patient not taking: Reported on 09/26/2018 03/30/18   [name], MD  metoCLOPramide (REGLAN) 10 MG tablet Take 1 tablet (10 mg total) by mouth 4 (four) times daily. Patient not taking: Reported on 09/26/2018 05/27/18   [name], MD    Allergies Clopidogrel; Rosuvastatin calcium; Etodolac; Hydrocodone; Other; Statins; Meloxicam; and Pravastatin sodium  Family History  Problem Relation Age of Onset  . Coronary artery disease Mother   . Anemia Mother   . Kidney failure Mother   . Subarachnoid hemorrhage Father   . Breast cancer Sister 94  . Hypertension Brother     Social History Social History   Tobacco Use  . Smoking status: Former Smoker    Packs/day: 1.00    Years: 55.00    Pack years: 55.00    Types: Cigarettes    Last attempt to quit: 03/20/2012    Years since quitting: 6.5  . Smokeless tobacco: Never Used  Substance Use Topics  .  Alcohol use: Yes    Comment: Social  . Drug use: No    Review of Systems  Constitutional: No fever. Eyes: No redness. ENT: No neck pain. Cardiovascular: Positive for resolved chest pain. Respiratory: Denies shortness of breath. Gastrointestinal: No nausea.  Genitourinary: Negative for flank.  Musculoskeletal: Negative for back pain. Skin: Negative for rash. Neurological: Negative for headache.   ____________________________________________   PHYSICAL EXAM:  VITAL SIGNS: ED Triage Vitals  Enc Vitals Group     BP 09/26/18 2034 (!)  179/102     Pulse Rate 09/26/18 2034 68     Resp 09/26/18 2034 17     Temp 09/26/18 2034 97.8 F (36.6 C)     Temp Source 09/26/18 2034 Oral     SpO2 09/26/18 2034 97 %     Weight 09/26/18 2035 200 lb (90.7 kg)     Height 09/26/18 2035 6' (1.829 m)     Head Circumference --      Peak Flow --      Pain Score 09/26/18 2034 1     Pain Loc --      Pain Edu? --      Excl. in [place]? --     Constitutional: Alert and oriented. Well appearing and in no acute distress. Eyes: Conjunctivae are normal.  Head: Atraumatic. Nose: No congestion/rhinnorhea. Mouth/Throat: Mucous membranes are moist.   Neck: Normal range of motion.  Cardiovascular: Normal rate, regular rhythm. Grossly normal heart sounds.  Good peripheral circulation. Respiratory: Normal respiratory effort.  No retractions. Lungs CTAB. Gastrointestinal: No distention.  Musculoskeletal: Extremities warm and well perfused.  Neurologic:  Normal speech and language. No gross focal neurologic deficits are appreciated.  Skin:  Skin is warm and dry. No rash noted. Psychiatric: Mood and affect are normal. Speech and behavior are normal.  ____________________________________________   LABS (all labs ordered are listed, but only abnormal results are displayed)  Labs Reviewed  BASIC METABOLIC PANEL - Abnormal; Notable for the following components:      Result Value   Creatinine, Ser 1.35 (*)    GFR calc non Af Amer 53 (*)    All other components within normal limits  TROPONIN I - Abnormal; Notable for the following components:   Troponin I 0.19 (*)    All other components within normal limits  CBC  APTT  PROTIME-INR  HEPARIN LEVEL (UNFRACTIONATED)  CBC  HIV ANTIBODY (ROUTINE TESTING W REFLEX)  TROPONIN I  TROPONIN I  TROPONIN I   ____________________________________________  EKG  ED ECG REPORT I, Arta Silence, the attending physician, personally viewed and interpreted this ECG.  Date: 09/26/2018 EKG Time:  2031 Rate: 66 Rhythm: normal sinus rhythm QRS Axis: normal Intervals: normal ST/T Wave abnormalities: normal Narrative Interpretation: no evidence of acute ischemia  ____________________________________________  RADIOLOGY  CXR: No focal infiltrate or other acute abnormality  ____________________________________________   PROCEDURES  Procedure(s) performed: No  Procedures  Critical Care performed: Yes  CRITICAL CARE Performed by: Arta Silence   Total critical care time: 20 minutes  Critical care time was exclusive of separately billable procedures and treating other patients.  Critical care was necessary to treat or prevent imminent or life-threatening deterioration.  Critical care was time spent personally by me on the following activities: development of treatment plan with patient and/or surrogate as well as nursing, discussions with consultants, evaluation of patient's response to treatment, examination of patient, obtaining history from patient or surrogate, ordering and performing treatments and interventions, ordering and review  of laboratory studies, ordering and review of radiographic studies, pulse oximetry and re-evaluation of patient's condition. ____________________________________________   INITIAL IMPRESSION / ASSESSMENT AND PLAN / ED COURSE  Pertinent labs & imaging results that were available during my care of the patient were reviewed by me and considered in my medical decision making (see chart for details).  72 year old male with history of CAD status post stenting several years ago and other PMH as noted above presents with somewhat atypical chest pain occurring after he ate dinner over the last several days, but more intense today and associated with radiation to the left arm.  He states that the pain has now resolved.  He denies any significant associated symptoms.  The patient states that he takes aspirin, but is not on any other  anticoagulation.  He does not have a cardiologist currently, and has not followed with one only several years.  On exam the patient is well-appearing and his vital signs are normal except for hypertension.  The remainder of the exam is unremarkable.  His EKG shows no ischemic findings.  The presentation is most consistent with GERD, however given his elevated risk we will obtain troponin, basic labs, chest x-ray, and reassess.  I anticipate repeat at least 4 hours from symptom onset if the first is negative, and discharge if both are negative.  ----------------------------------------- 1:08 AM on 09/27/2018 -----------------------------------------  The patient's initial troponin was elevated.  Given his CAD history, this is concerning for ACS.  Given that he has no active pain and no EKG changes, he does not require heparin drip at this time.  I gave the patient an additional 3 aspirin.  I informed him of the lab findings and he agrees with admission to the hospital for further work-up.  I signed the patient out to the hospitalist Dr. Jerelyn Charles. ____________________________________________   FINAL CLINICAL IMPRESSION(S) / ED DIAGNOSES  Final diagnoses:  Atypical chest pain  Elevated troponin      NEW MEDICATIONS STARTED DURING THIS VISIT:  Current Discharge Medication List       Note:  This document was prepared using Dragon voice recognition software and may include unintentional dictation errors.    Arta Silence, MD 09/27/18 [phone]

## 2018-09-27 ENCOUNTER — Observation Stay
Admit: 2018-09-27 | Discharge: 2018-09-27 | Disposition: A | Discharge status: Home / Self Care | Payer: Medicare Other | Attending: Family Medicine | Admitting: Family Medicine

## 2018-09-27 ENCOUNTER — Other Ambulatory Visit: Payer: Self-pay

## 2018-09-27 LAB — CBC
HCT: 44.1 % (ref 39.0–52.0)
Hemoglobin: 14.3 g/dL (ref 13.0–17.0)
MCH: 31.3 pg (ref 26.0–34.0)
MCHC: 32.4 g/dL (ref 30.0–36.0)
MCV: 96.5 fL (ref 80.0–100.0)
Platelets: 208 10*3/uL (ref 150–400)
RBC: 4.57 MIL/uL (ref 4.22–5.81)
RDW: 13.4 % (ref 11.5–15.5)
WBC: 8.7 10*3/uL (ref 4.0–10.5)
nRBC: 0 % (ref 0.0–0.2)

## 2018-09-27 LAB — APTT: aPTT: >160 seconds (ref 24–36)

## 2018-09-27 LAB — PROTIME-INR
INR: 1.02
Prothrombin Time: 13.3 seconds (ref 11.4–15.2)

## 2018-09-27 LAB — TROPONIN I
TROPONIN I: 0.26 ng/mL — AB (ref <0.03)
TROPONIN I: 0.26 ng/mL — AB (ref <0.03)
Troponin I: 0.2 ng/mL (ref <0.03)

## 2018-09-27 LAB — HEPARIN LEVEL (UNFRACTIONATED)
Heparin Unfractionated: 0.38 IU/mL (ref 0.30–0.70)
Heparin Unfractionated: 0.47 IU/mL (ref 0.30–0.70)

## 2018-09-27 NOTE — Progress Notes (Signed)
[place] [facility] [place] NAME: David Rich    MR#:  [id number]  DATE OF BIRTH:  [birth year]  SUBJECTIVE:  Patient denies chest pain, cardiology input appreciated-for heart catheterization on tomorrow  REVIEW OF SYSTEMS:  CONSTITUTIONAL: No fever, fatigue or weakness.  EYES: No blurred or double vision.  EARS, NOSE, AND THROAT: No tinnitus or ear pain.  RESPIRATORY: No cough, shortness of breath, wheezing or hemoptysis.  CARDIOVASCULAR: No chest pain, orthopnea, edema.  GASTROINTESTINAL: No nausea, vomiting, diarrhea or abdominal pain.  GENITOURINARY: No dysuria, hematuria.  ENDOCRINE: No polyuria, nocturia,  HEMATOLOGY: No anemia, easy bruising or bleeding SKIN: No rash or lesion. MUSCULOSKELETAL: No joint pain or arthritis.   NEUROLOGIC: No tingling, numbness, weakness.  PSYCHIATRY: No anxiety or depression.   ROS  DRUG ALLERGIES:   Allergies  Allergen Reactions  . Clopidogrel Rash and Hives  . Rosuvastatin Calcium Anaphylaxis  . Etodolac Nausea And Vomiting and Other (See Comments)    Bad dreams Bad dreams   . Hydrocodone Other (See Comments)    Bad dreams Bad dreams   . Other   . Statins   . Meloxicam Rash    Bad dreams Bad dreams   . Pravastatin Sodium Rash    VITALS:  Blood pressure (!) 148/67, pulse 72, temperature 97.8 F (36.6 C), temperature source Oral, resp. rate 19, height 6' (1.829 m), weight 92.6 kg, SpO2 98 %.  PHYSICAL EXAMINATION:  GENERAL:  72 y.o.-year-old patient lying in the bed with no acute distress.  EYES: Pupils equal, round, reactive to light and accommodation. No scleral icterus. Extraocular muscles intact.  HEENT: Head atraumatic, normocephalic. Oropharynx and nasopharynx clear.  NECK:  Supple, no jugular venous distention. No thyroid enlargement, no tenderness.  LUNGS: Normal breath sounds bilaterally, no wheezing, rales,rhonchi or crepitation. No use of accessory muscles of respiration.   CARDIOVASCULAR: S1, S2 normal. No murmurs, rubs, or gallops.  ABDOMEN: Soft, nontender, nondistended. Bowel sounds present. No organomegaly or mass.  EXTREMITIES: No pedal edema, cyanosis, or clubbing.  NEUROLOGIC: Cranial nerves II through XII are intact. Muscle strength 5/5 in all extremities. Sensation intact. Gait not checked.  PSYCHIATRIC: The patient is alert and oriented x 3.  SKIN: No obvious rash, lesion, or ulcer.   Physical Exam LABORATORY PANEL:   CBC Recent Labs  Lab 09/27/18 0753  WBC 8.7  HGB 14.3  HCT 44.1  PLT 208   ------------------------------------------------------------------------------------------------------------------  Chemistries  Recent Labs  Lab 09/26/18 2042  NA 137  K 4.0  CL 105  CO2 25  GLUCOSE 97  BUN 18  CREATININE 1.35*  CALCIUM 9.2   ------------------------------------------------------------------------------------------------------------------  Cardiac Enzymes Recent Labs  Lab 09/27/18 0408 09/27/18 0753  TROPONINI 0.26* 0.20*   ------------------------------------------------------------------------------------------------------------------  RADIOLOGY:  Dg Chest 2 View  Result Date: 09/26/2018 CLINICAL DATA:  Acute onset of generalized chest discomfort and left forearm pain. Personal history of bladder cancer. EXAM: CHEST - 2 VIEW COMPARISON:  PET/CT performed 06/29/2018 FINDINGS: The lungs are well-aerated. Mild scarring is noted at the lung apices. There is no evidence of focal opacification, pleural effusion or pneumothorax. A right-sided chest port is noted ending about the distal SVC. The heart is normal in size; the mediastinal contour is within normal limits. No acute osseous abnormalities are seen. IMPRESSION: No acute cardiopulmonary process seen. Electronically Signed   By: Garald Balding M.D.   On: 09/26/2018 21:11    ASSESSMENT AND PLAN:  *Acute NSTEMI with history of CAD/coronary  artery stenting  x1 Continue ACS protocol, aspirin, statin therapy, Lopressor, nitrates as needed, IV morphine PRN breakthrough pain, supplemental oxygen, heparin drip, cardiology input appreciated-for heart catheterization on tomorrow, follow-up on echocardiogram, and continue close medical monitoring  *Chronic hypertension Stable Continue Norvasc, continue beta-blocker therapy, IV hydralazine as needed systolic blood pressure greater than 160, vitals per routine, make changes as per necessary  *Chronic hyperlipidemia, unspecified Continue statin therapy  *Chronic kidney disease stage III At baseline Avoid nephrotoxic agents  All the records are reviewed and case discussed with Care Management/Social Workerr. Management plans discussed with the patient, family and they are in agreement.  CODE STATUS:full  TOTAL TIME TAKING CARE OF THIS PATIENT: 40 minutes.    POSSIBLE D/C IN 1-2 DAYS, DEPENDING ON CLINICAL CONDITION.   David Rich Salary M.D on 09/27/2018   Between 7am to 6pm - Pager - [phone]  After 6pm go to [email or web address] - password [email or web address] [place] [facility]  Office  [phone]  CC: Primary care physician; David Pitch, MD  Note: This dictation was prepared with Dragon dictation along with smaller phrase technology. Any transcriptional errors that result from this process are unintentional.

## 2018-09-27 NOTE — Progress Notes (Signed)
ANTICOAGULATION CONSULT NOTE - Initial Consult  Pharmacy Consult for heparin Indication: chest pain/ACS  Allergies  Allergen Reactions  . Clopidogrel Rash and Hives  . Rosuvastatin Calcium Anaphylaxis  . Etodolac Nausea And Vomiting and Other (See Comments)    Bad dreams Bad dreams   . Hydrocodone Other (See Comments)    Bad dreams Bad dreams   . Other   . Statins   . Meloxicam Rash    Bad dreams Bad dreams   . Pravastatin Sodium Rash    Patient Measurements: Height: 6' (182.9 cm) Weight: 204 lb 1.6 oz (92.6 kg) IBW/kg (Calculated) : 77.6 Heparin Dosing Weight: 92.6 kg  Vital Signs: Temp: 97.8 F (36.6 C) (12/21 2356) Temp Source: Oral (12/21 2356) BP: 173/83 (12/21 2356) Pulse Rate: 54 (12/21 2356)  Labs: Recent Labs    09/26/18 2042  HGB 14.9  HCT 46.1  PLT 224  CREATININE 1.35*  TROPONINI 0.19*    Estimated Creatinine Clearance: 55.9 mL/min (A) (by C-G formula based on SCr of 1.35 mg/dL (H)).   Medical History: Past Medical History:  Diagnosis Date  . Cancer [facility])    prostate  . Hyperlipidemia   . Hypertension   . Renal disorder     Medications:  Scheduled:  . alum & mag hydroxide-simeth  30 mL Oral Once  . amLODipine  5 mg Oral Daily  . aspirin EC  325 mg Oral Daily  . heparin  4,000 Units Intravenous Once  . metoprolol tartrate  25 mg Oral BID  . omega-3 acid ethyl esters  1 capsule Oral Daily    Assessment: Patient admitted s/t CP w/ radiation down to L arm w/ h/o CAD s/p stent (unsure of time). Initial trops 0.19, EKG showing peaked T-waved and some T-wave inversion. Patient is being started on heparin drip for probably NSTEMI. No PTA anticoagulation.  Goal of Therapy:  Heparin level 0.3-0.7 units/ml Monitor platelets by anticoagulation protocol: Yes   Plan:  Will bolus w/ heparin 4000 units IV x 1 Will start rate at 1200 units/hr  Will draw anti-Xa @ 0800 (8 hrs post start) Baseline labs drawn Will monitor daily CBC's and  adjust per anti-Xa levels. Enoxaparin for VTE prophylaxis was ordered in admission orders; this has been d/c'd.  Tobie Lords, PharmD, BCPS Clinical Pharmacist 09/27/2018

## 2018-09-27 NOTE — Progress Notes (Signed)
David Rich for heparin Indication: chest pain/ACS  Allergies  Allergen Reactions  . Clopidogrel Rash and Hives  . Rosuvastatin Calcium Anaphylaxis  . Etodolac Nausea And Vomiting and Other (See Comments)    Bad dreams Bad dreams   . Hydrocodone Other (See Comments)    Bad dreams Bad dreams   . Other   . Statins   . Meloxicam Rash    Bad dreams Bad dreams   . Pravastatin Sodium Rash    Patient Measurements: Height: 6' (182.9 cm) Weight: 204 lb 1.6 oz (92.6 kg) IBW/kg (Calculated) : 77.6 Heparin Dosing Weight: 92.6 kg  Vital Signs: Temp: 97.5 F (36.4 C) (12/22 1650) Temp Source: Oral (12/22 1650) BP: 133/81 (12/22 1650) Pulse Rate: 56 (12/22 1650)  Labs: Recent Labs    09/26/18 2042 09/27/18 0047 09/27/18 0408 09/27/18 0753 09/27/18 1648  HGB 14.9  --   --  14.3  --   HCT 46.1  --   --  44.1  --   PLT 224  --   --  208  --   APTT  --  >160*  --   --   --   LABPROT  --  13.3  --   --   --   INR  --  1.02  --   --   --   HEPARINUNFRC  --   --   --  0.38 0.47  CREATININE 1.35*  --   --   --   --   TROPONINI 0.19* 0.26* 0.26* 0.20*  --     Estimated Creatinine Clearance: 55.9 mL/min (A) (by C-G formula based on SCr of 1.35 mg/dL (H)).   Medical History: Past Medical History:  Diagnosis Date  . Cancer [facility])    prostate  . Hyperlipidemia   . Hypertension   . Renal disorder     Medications:  Scheduled:  . amLODipine  5 mg Oral Daily  . aspirin EC  325 mg Oral Daily  . metoprolol tartrate  25 mg Oral BID  . omega-3 acid ethyl esters  1 capsule Oral Daily    Assessment: Patient admitted s/t CP w/ radiation down to L arm w/ h/o CAD s/p stent (unsure of time). Initial trops 0.19, EKG showing peaked T-waved and some T-wave inversion. Patient is being started on heparin drip for probably NSTEMI. No PTA anticoagulation.  Will bolus w/ heparin 4000 units IV x 1 Will start rate at 1200 units/hr  Will draw anti-Xa  @ 0800 (8 hrs post start) Baseline labs drawn Will monitor daily CBC's and adjust per anti-Xa levels. Enoxaparin for VTE prophylaxis was ordered in admission orders; this has been d/c'd.  Goal of Therapy:  Heparin level 0.3-0.7 units/ml Monitor platelets by anticoagulation protocol: Yes   Plan:  12/22 1648 HL = 0.47. Will continue current drip rate of 1200 units/hr. With two consecutive therapeutic levels will now check heparin level daily with morning labs  Evelena Asa, PharmD Clinical Pharmacist 09/27/2018

## 2018-09-27 NOTE — Plan of Care (Signed)
  Problem: Pain Managment: Goal: General experience of comfort will improve Outcome: Progressing   Problem: Safety: Goal: Ability to remain free from injury will improve Outcome: Progressing   Problem: Activity: Goal: Ability to tolerate increased activity will improve Outcome: Progressing   

## 2018-09-27 NOTE — Progress Notes (Signed)
[place] for heparin Indication: chest pain/ACS  Allergies  Allergen Reactions  . Clopidogrel Rash and Hives  . Rosuvastatin Calcium Anaphylaxis  . Etodolac Nausea And Vomiting and Other (See Comments)    Bad dreams Bad dreams   . Hydrocodone Other (See Comments)    Bad dreams Bad dreams   . Other   . Statins   . Meloxicam Rash    Bad dreams Bad dreams   . Pravastatin Sodium Rash    Patient Measurements: Height: 6' (182.9 cm) Weight: 204 lb 1.6 oz (92.6 kg) IBW/kg (Calculated) : 77.6 Heparin Dosing Weight: 92.6 kg  Vital Signs: Temp: 97.8 F (36.6 C) (12/22 0816) Temp Source: Oral (12/22 0816) BP: 148/67 (12/22 0816) Pulse Rate: 72 (12/22 0816)  Labs: Recent Labs    09/26/18 2042 09/27/18 0047 09/27/18 0408 09/27/18 0753  HGB 14.9  --   --  14.3  HCT 46.1  --   --  44.1  PLT 224  --   --  208  APTT  --  >160*  --   --   LABPROT  --  13.3  --   --   INR  --  1.02  --   --   HEPARINUNFRC  --   --   --  0.38  CREATININE 1.35*  --   --   --   TROPONINI 0.19* 0.26* 0.26* 0.20*    Estimated Creatinine Clearance: 55.9 mL/min (A) (by C-G formula based on SCr of 1.35 mg/dL (H)).   Medical History: Past Medical History:  Diagnosis Date  . Cancer [facility])    prostate  . Hyperlipidemia   . Hypertension   . Renal disorder     Medications:  Scheduled:  . amLODipine  5 mg Oral Daily  . aspirin EC  325 mg Oral Daily  . metoprolol tartrate  25 mg Oral BID  . omega-3 acid ethyl esters  1 capsule Oral Daily    Assessment: Patient admitted s/t CP w/ radiation down to L arm w/ h/o CAD s/p stent (unsure of time). Initial trops 0.19, EKG showing peaked T-waved and some T-wave inversion. Patient is being started on heparin drip for probably NSTEMI. No PTA anticoagulation.  Will bolus w/ heparin 4000 units IV x 1 Will start rate at 1200 units/hr  Will draw anti-Xa @ 0800 (8 hrs post start) Baseline labs drawn Will  monitor daily CBC's and adjust per anti-Xa levels. Enoxaparin for VTE prophylaxis was ordered in admission orders; this has been d/c'd.  Goal of Therapy:  Heparin level 0.3-0.7 units/ml Monitor platelets by anticoagulation protocol: Yes   Plan:  12/22 0753 HL = 0.38. Will continue current drip rate of 1200 units/hr. Will check confirmatory level in 8 hrs.  Chinita Greenland PharmD Clinical Pharmacist 09/27/2018

## 2018-09-27 NOTE — Consult Note (Signed)
[facility] Consultation Note  Patient ID: David Rich, MRN: [id number], DOB/AGE: Mar 17, 1946 72 y.o. Admit date: 09/26/2018   Date of Consult: 09/27/2018 Primary Physician: Juluis Pitch, MD Primary Cardiologist: None  Chief Complaint:  Chief Complaint  Patient presents with  . Chest Pain   Reason for Consult: Chest pain  HPI: 72 y.o. male with known coronary artery disease status post previous PCI and stent placement in the remote past who has had essential hypertension mixed hyperlipidemia on appropriate medications for hypertension but cannot tolerate statin therapy in the past and so has only taken fish oil.  The patient has had bladder cancer for which she has had a significant treatment and unable to get up and move around or do any activity whatsoever.  He has had a suspension of that chemotherapy at this time.  The patient now has had new onset of substernal chest discomfort heartburn type issues with left arm discomfort at the same time progressive and lasting for several hours at a time.  When seen in the emergency room he had relief with medication management with a normal EKG.  Troponin has has now elevated to 0.26 consistent with non-ST elevation myocardial infarction.  He has had full resolution of chest discomfort and symptoms with heparin and nitrate.  Past Medical History:  Diagnosis Date  . Cancer [facility])    prostate  . Hyperlipidemia   . Hypertension   . Renal disorder       Surgical History:  Past Surgical History:  Procedure Laterality Date  . CORONARY STENT PLACEMENT    . IR FLUORO GUIDE CV LINE RIGHT  04/03/2018  . kidney removed Left 2013     Home Meds: Prior to Admission medications   Medication Sig Start Date End Date Taking? Authorizing Provider  acetaminophen (TYLENOL) 325 MG tablet Take by mouth. 02/18/18  Yes [provider]  amLODipine (NORVASC) 2.5 MG tablet Take 1 tablet (2.5 mg total) by mouth daily. 07/29/17 09/26/18  Yes Little, Traci M, PA-C  aspirin EC 81 MG tablet Take by mouth.   Yes [provider]  Garlic 885 MG TABS Take by mouth.    Yes [provider]  metoprolol tartrate (LOPRESSOR) 25 MG tablet Take 25 mg by mouth.  09/19/17  Yes [provider]  Multiple Vitamin (MULTI-VITAMINS) TABS Take by mouth.   Yes [provider]  Omega-3 Fatty Acids (FISH OIL OMEGA-3) 1000 MG CAPS Take by mouth daily.   Yes [provider]  lidocaine-prilocaine (EMLA) cream Apply to affected area once Patient not taking: Reported on 09/26/2018 03/30/18   [name], MD  metoCLOPramide (REGLAN) 10 MG tablet Take 1 tablet (10 mg total) by mouth 4 (four) times daily. Patient not taking: Reported on 09/26/2018 05/27/18   [name], MD    Inpatient Medications:  . amLODipine  5 mg Oral Daily  . aspirin EC  325 mg Oral Daily  . metoprolol tartrate  25 mg Oral BID  . omega-3 acid ethyl esters  1 capsule Oral Daily   . heparin 1,200 Units/hr (09/27/18 0043)    Allergies:  Allergies  Allergen Reactions  . Clopidogrel Rash and Hives  . Rosuvastatin Calcium Anaphylaxis  . Etodolac Nausea And Vomiting and Other (See Comments)    Bad dreams Bad dreams   . Hydrocodone Other (See Comments)    Bad dreams Bad dreams   . Other   . Statins   . Meloxicam Rash  Bad dreams Bad dreams   . Pravastatin Sodium Rash    Social History   Socioeconomic History  . Marital status: Divorced    Spouse name: Not on file  . Number of children: Not on file  . Years of education: Not on file  . Highest education level: Not on file  Occupational History  . Not on file  Social Needs  . Financial resource strain: Not on file  . Food insecurity:    Worry: Not on file    Inability: Not on file  . Transportation needs:    Medical: Not on file    Non-medical: Not on file  Tobacco Use  . Smoking status: Former Smoker    Packs/day: 1.00    Years: 55.00    Pack  years: 55.00    Types: Cigarettes    Last attempt to quit: 03/20/2012    Years since quitting: 6.5  . Smokeless tobacco: Never Used  Substance and Sexual Activity  . Alcohol use: Yes    Comment: Social  . Drug use: No  . Sexual activity: Not on file  Lifestyle  . Physical activity:    Days per week: Not on file    Minutes per session: Not on file  . Stress: Not on file  Relationships  . Social connections:    Talks on phone: Not on file    Gets together: Not on file    Attends religious service: Not on file    Active member of club or organization: Not on file    Attends meetings of clubs or organizations: Not on file    Relationship status: Not on file  . Intimate partner violence:    Fear of current or ex partner: Not on file    Emotionally abused: Not on file    Physically abused: Not on file    Forced sexual activity: Not on file  Other Topics Concern  . Not on file  Social History Narrative  . Not on file     Family History  Problem Relation Age of Onset  . Coronary artery disease Mother   . Anemia Mother   . Kidney failure Mother   . Subarachnoid hemorrhage Father   . Breast cancer Sister 27  . Hypertension Brother      Review of Systems Positive for chest pain Negative for: General:  chills, fever, night sweats or weight changes.  Cardiovascular: PND orthopnea syncope dizziness  Dermatological skin lesions rashes Respiratory: Cough congestion Urologic: Frequent urination urination at night and hematuria Abdominal: negative for nausea, vomiting, diarrhea, bright red blood per rectum, melena, or hematemesis Neurologic: negative for visual changes, and/or hearing changes  All other systems reviewed and are otherwise negative except as noted above.  Labs: Recent Labs    09/26/18 2042 09/27/18 0047 09/27/18 0408  TROPONINI 0.19* 0.26* 0.26*   Lab Results  Component Value Date   WBC 10.2 09/26/2018   HGB 14.9 09/26/2018   HCT 46.1 09/26/2018   MCV  96.4 09/26/2018   PLT 224 09/26/2018    Recent Labs  Lab 09/26/18 2042  NA 137  K 4.0  CL 105  CO2 25  BUN 18  CREATININE 1.35*  CALCIUM 9.2  GLUCOSE 97   No results found for: CHOL, HDL, LDLCALC, TRIG No results found for: DDIMER  Radiology/Studies:  Dg Chest 2 View  Result Date: 09/26/2018 CLINICAL DATA:  Acute onset of generalized chest discomfort and left forearm pain. Personal history of bladder cancer. EXAM:  CHEST - 2 VIEW COMPARISON:  PET/CT performed 06/29/2018 FINDINGS: The lungs are well-aerated. Mild scarring is noted at the lung apices. There is no evidence of focal opacification, pleural effusion or pneumothorax. A right-sided chest port is noted ending about the distal SVC. The heart is normal in size; the mediastinal contour is within normal limits. No acute osseous abnormalities are seen. IMPRESSION: No acute cardiopulmonary process seen. Electronically Signed   By: Garald Balding M.D.   On: 09/26/2018 21:11    EKG: Normal sinus rhythm  Weights: Filed Weights   09/26/18 2035 09/26/18 2356  Weight: 90.7 kg 92.6 kg     Physical Exam: Blood pressure (!) 155/80, pulse 71, temperature 97.6 F (36.4 C), temperature source Oral, resp. rate 19, height 6' (1.829 m), weight 92.6 kg, SpO2 98 %. Body mass index is 27.68 kg/m. General: Well developed, well nourished, in no acute distress. Head eyes ears nose throat: Normocephalic, atraumatic, sclera non-icteric, no xanthomas, nares are without discharge. No apparent thyromegaly and/or mass  Lungs: Normal respiratory effort.  no wheezes, no rales, no rhonchi.  Heart: RRR with normal S1 S2. no murmur gallop, no rub, PMI is normal size and placement, carotid upstroke normal without bruit, jugular venous pressure is normal Abdomen: Soft, non-tender, non-distended with normoactive bowel sounds. No hepatomegaly. No rebound/guarding. No obvious abdominal masses. Abdominal aorta is normal size without bruit Extremities: No  edema. no cyanosis, no clubbing, no ulcers  Peripheral : 2+ bilateral upper extremity pulses, 2+ bilateral femoral pulses, 2+ bilateral dorsal pedal pulse Neuro: Alert and oriented. No facial asymmetry. No focal deficit. Moves all extremities spontaneously. Musculoskeletal: Normal muscle tone without kyphosis Psych:  Responds to questions appropriately with a normal affect.    Assessment: 72 year old male with known cardiovascular disease essential hypertension mixed hyperlipidemia with non-ST elevation myocardial infarction  Plan: 1.  Continue heparin for further risk reduction of extent of myocardial infarction and addition of aspirin 2.  Hypertension control with current medical regimen without change 3.  Nitrates for chest pain 4.  Proceed to cardiac catheterization to assess coronary anatomy and further treatment thereof is necessary.  Patient understands the risk and benefits of cardiac catheterization.  This includes a possibility of death stroke heart attack infection bleeding or blood clot.  He is at low risk for conscious sedation  Signed, [name] M.D. [facility] 09/27/2018, 7:11 AM

## 2018-09-27 NOTE — Plan of Care (Signed)
  Problem: Clinical Measurements: Goal: Ability to maintain clinical measurements within normal limits will improve Outcome: Progressing Goal: Will remain free from infection Outcome: Progressing Note:  Remains afebrile Goal: Respiratory complications will improve Outcome: Progressing Goal: Cardiovascular complication will be avoided Outcome: Progressing Note:  No current chest pain   Problem: Nutrition: Goal: Adequate nutrition will be maintained Outcome: Progressing   Problem: Coping: Goal: Level of anxiety will decrease Outcome: Progressing   Problem: Elimination: Goal: Will not experience complications related to bowel motility Outcome: Progressing Goal: Will not experience complications related to urinary retention Outcome: Progressing   Problem: Pain Managment: Goal: General experience of comfort will improve Outcome: Progressing   Problem: Activity: Goal: Ability to tolerate increased activity will improve Outcome: Progressing   Problem: Cardiac: Goal: Ability to achieve and maintain adequate cardiovascular perfusion will improve Outcome: Progressing   Problem: Education: Goal: Understanding of CV disease, CV risk reduction, and recovery process will improve Outcome: Progressing Goal: Individualized Educational Video(s) Outcome: Progressing Note:  Pt scheduled for cardiac cath tomorrow   Problem: Activity: Goal: Ability to return to baseline activity level will improve Outcome: Progressing   Problem: Clinical Measurements: Goal: Diagnostic test results will improve Outcome: Not Progressing Note:  Troponins peaked at .26, last troponin .[facility]

## 2018-09-27 NOTE — Progress Notes (Signed)
Lab called for a critical value of aPTT >160. Notify prime. Will continue to monitor.

## 2018-09-27 NOTE — Progress Notes (Signed)
Pt refused bed alarm, but educated about safety. Will continue monitor.

## 2018-09-27 NOTE — Care Management Obs Status (Signed)
[place] [facility] NOTIFICATION   Patient Details  Name: MARLOS CARMEN MRN: [id number] Date of Birth: 03-07-1946   Medicare Observation Status Notification Given:  Yes    Surena Welge A Zekiel Torian, RN 09/27/2018, 9:59 AM

## 2018-09-28 ENCOUNTER — Other Ambulatory Visit: Payer: Self-pay

## 2018-09-28 ENCOUNTER — Encounter
Admission: EM | Disposition: A | Discharge status: Home / Self Care | Payer: Self-pay | Source: Home / Self Care | Attending: Family Medicine

## 2018-09-28 ENCOUNTER — Encounter: Payer: Self-pay | Admitting: Internal Medicine

## 2018-09-28 DIAGNOSIS — N183 Chronic kidney disease, stage 3 (moderate): Secondary | ICD-10-CM | POA: Diagnosis present

## 2018-09-28 DIAGNOSIS — E782 Mixed hyperlipidemia: Secondary | ICD-10-CM | POA: Diagnosis present

## 2018-09-28 DIAGNOSIS — Z888 Allergy status to other drugs, medicaments and biological substances status: Secondary | ICD-10-CM | POA: Diagnosis not present

## 2018-09-28 DIAGNOSIS — Z79899 Other long term (current) drug therapy: Secondary | ICD-10-CM | POA: Diagnosis not present

## 2018-09-28 DIAGNOSIS — Z9221 Personal history of antineoplastic chemotherapy: Secondary | ICD-10-CM | POA: Diagnosis not present

## 2018-09-28 DIAGNOSIS — Z7982 Long term (current) use of aspirin: Secondary | ICD-10-CM | POA: Diagnosis not present

## 2018-09-28 DIAGNOSIS — I252 Old myocardial infarction: Secondary | ICD-10-CM | POA: Diagnosis not present

## 2018-09-28 DIAGNOSIS — I2510 Atherosclerotic heart disease of native coronary artery without angina pectoris: Secondary | ICD-10-CM

## 2018-09-28 DIAGNOSIS — I214 Non-ST elevation (NSTEMI) myocardial infarction: Secondary | ICD-10-CM | POA: Diagnosis present

## 2018-09-28 DIAGNOSIS — I129 Hypertensive chronic kidney disease with stage 1 through stage 4 chronic kidney disease, or unspecified chronic kidney disease: Secondary | ICD-10-CM | POA: Diagnosis present

## 2018-09-28 DIAGNOSIS — Z87891 Personal history of nicotine dependence: Secondary | ICD-10-CM | POA: Diagnosis not present

## 2018-09-28 DIAGNOSIS — Z955 Presence of coronary angioplasty implant and graft: Secondary | ICD-10-CM | POA: Diagnosis not present

## 2018-09-28 DIAGNOSIS — Z8551 Personal history of malignant neoplasm of bladder: Secondary | ICD-10-CM | POA: Diagnosis not present

## 2018-09-28 DIAGNOSIS — R0789 Other chest pain: Secondary | ICD-10-CM | POA: Diagnosis present

## 2018-09-28 DIAGNOSIS — Z8249 Family history of ischemic heart disease and other diseases of the circulatory system: Secondary | ICD-10-CM | POA: Diagnosis not present

## 2018-09-28 HISTORY — PX: LEFT HEART CATH AND CORONARY ANGIOGRAPHY: CATH118249

## 2018-09-28 HISTORY — PX: CORONARY STENT INTERVENTION: CATH118234

## 2018-09-28 LAB — BASIC METABOLIC PANEL
Anion gap: 7 (ref 5–15)
BUN: 18 mg/dL (ref 8–23)
CHLORIDE: 106 mmol/L (ref 98–111)
CO2: 20 mmol/L — ABNORMAL LOW (ref 22–32)
Calcium: 8.8 mg/dL — ABNORMAL LOW (ref 8.9–10.3)
Creatinine, Ser: 1 mg/dL (ref 0.61–1.24)
GFR calc non Af Amer: >60 mL/min (ref >60)
Glucose, Bld: 77 mg/dL (ref 70–99)
Potassium: 4.1 mmol/L (ref 3.5–5.1)
Sodium: 133 mmol/L — ABNORMAL LOW (ref 135–145)

## 2018-09-28 LAB — ECHOCARDIOGRAM COMPLETE
Height: 72 in
Weight: 3265.6 oz

## 2018-09-28 LAB — CBC
HEMATOCRIT: 45.3 % (ref 39.0–52.0)
HEMOGLOBIN: 14.7 g/dL (ref 13.0–17.0)
MCH: 31.1 pg (ref 26.0–34.0)
MCHC: 32.5 g/dL (ref 30.0–36.0)
MCV: 96 fL (ref 80.0–100.0)
Platelets: 226 10*3/uL (ref 150–400)
RBC: 4.72 MIL/uL (ref 4.22–5.81)
RDW: 13.5 % (ref 11.5–15.5)
WBC: 9.7 10*3/uL (ref 4.0–10.5)
nRBC: 0 % (ref 0.0–0.2)

## 2018-09-28 LAB — POCT ACTIVATED CLOTTING TIME: Activated Clotting Time: 246 seconds

## 2018-09-28 LAB — HEPARIN LEVEL (UNFRACTIONATED): Heparin Unfractionated: 0.61 IU/mL (ref 0.30–0.70)

## 2018-09-28 SURGERY — LEFT HEART CATH AND CORONARY ANGIOGRAPHY
Anesthesia: Moderate Sedation

## 2018-09-28 MED ORDER — ASPIRIN 81 MG PO CHEW
CHEWABLE_TABLET | ORAL | Status: AC
  Administered 2018-09-28: 07:00:00
  Filled 2018-09-28: qty 1

## 2018-09-28 MED ORDER — ASPIRIN 81 MG PO CHEW
81.0000 mg | CHEWABLE_TABLET | ORAL | Status: AC
  Administered 2018-09-28: 81 mg via ORAL
  Filled 2018-09-28: qty 1

## 2018-09-28 MED ORDER — NITROGLYCERIN 1 MG/10 ML FOR IR/CATH LAB
INTRA_ARTERIAL | Status: DC | PRN
  Administered 2018-09-28: 200 ug via INTRACORONARY

## 2018-09-28 MED ORDER — SODIUM CHLORIDE 0.9 % WEIGHT BASED INFUSION: 1.0000 mL/kg/h | INTRAVENOUS | Status: DC

## 2018-09-28 MED ORDER — ASPIRIN 81 MG PO CHEW: 81.0000 mg | CHEWABLE_TABLET | ORAL | Status: DC

## 2018-09-28 MED ORDER — SODIUM CHLORIDE 0.9% FLUSH
3.0000 mL | Freq: Two times a day (BID) | INTRAVENOUS | Status: DC
  Administered 2018-09-28 – 2018-09-29 (×2): 3 mL via INTRAVENOUS

## 2018-09-28 MED ORDER — SODIUM CHLORIDE 0.9 % WEIGHT BASED INFUSION: 3.0000 mL/kg/h | INTRAVENOUS | Status: DC

## 2018-09-28 MED ORDER — TICAGRELOR 90 MG PO TABS
90.0000 mg | ORAL_TABLET | Freq: Two times a day (BID) | ORAL | Status: DC
  Administered 2018-09-28 – 2018-09-29 (×2): 90 mg via ORAL
  Filled 2018-09-28 (×2): qty 1

## 2018-09-28 MED ORDER — SODIUM CHLORIDE 0.9% FLUSH
3.0000 mL | Freq: Two times a day (BID) | INTRAVENOUS | Status: DC
  Administered 2018-09-28: 3 mL via INTRAVENOUS

## 2018-09-28 MED ORDER — NITROGLYCERIN 5 MG/ML IV SOLN
INTRAVENOUS | Status: AC
  Filled 2018-09-28: qty 10

## 2018-09-28 MED ORDER — SODIUM CHLORIDE 0.9% FLUSH: 3.0000 mL | INTRAVENOUS | Status: DC | PRN

## 2018-09-28 MED ORDER — ASPIRIN 81 MG PO CHEW
CHEWABLE_TABLET | ORAL | Status: DC | PRN
  Administered 2018-09-28: 162 mg via ORAL

## 2018-09-28 MED ORDER — MIDAZOLAM HCL 2 MG/2ML IJ SOLN
INTRAMUSCULAR | Status: AC
  Filled 2018-09-28: qty 2

## 2018-09-28 MED ORDER — FENTANYL CITRATE (PF) 100 MCG/2ML IJ SOLN
INTRAMUSCULAR | Status: DC | PRN
  Administered 2018-09-28: 25 ug via INTRAVENOUS

## 2018-09-28 MED ORDER — HEPARIN SODIUM (PORCINE) 1000 UNIT/ML IJ SOLN
INTRAMUSCULAR | Status: AC
  Filled 2018-09-28: qty 1

## 2018-09-28 MED ORDER — VERAPAMIL HCL 2.5 MG/ML IV SOLN
INTRAVENOUS | Status: AC
  Filled 2018-09-28: qty 2

## 2018-09-28 MED ORDER — EZETIMIBE 10 MG PO TABS
10.0000 mg | ORAL_TABLET | Freq: Every day | ORAL | Status: DC
  Administered 2018-09-28 – 2018-09-29 (×2): 10 mg via ORAL
  Filled 2018-09-28 (×3): qty 1

## 2018-09-28 MED ORDER — SODIUM CHLORIDE 0.9 % IV SOLN: 250.0000 mL | INTRAVENOUS | Status: DC | PRN

## 2018-09-28 MED ORDER — SODIUM CHLORIDE 0.9 % WEIGHT BASED INFUSION
1.0000 mL/kg/h | INTRAVENOUS | Status: AC
  Administered 2018-09-28 (×2): 1 mL/kg/h via INTRAVENOUS

## 2018-09-28 MED ORDER — HEPARIN SODIUM (PORCINE) 1000 UNIT/ML IJ SOLN
INTRAMUSCULAR | Status: DC | PRN
  Administered 2018-09-28: 2000 [IU] via INTRAVENOUS
  Administered 2018-09-28: 4000 [IU] via INTRAVENOUS
  Administered 2018-09-28: 4500 [IU] via INTRAVENOUS

## 2018-09-28 MED ORDER — TICAGRELOR 90 MG PO TABS
ORAL_TABLET | ORAL | Status: AC
  Filled 2018-09-28: qty 2

## 2018-09-28 MED ORDER — FENTANYL CITRATE (PF) 100 MCG/2ML IJ SOLN
INTRAMUSCULAR | Status: AC
  Filled 2018-09-28: qty 2

## 2018-09-28 MED ORDER — MIDAZOLAM HCL 2 MG/2ML IJ SOLN
INTRAMUSCULAR | Status: DC | PRN
  Administered 2018-09-28: 1 mg via INTRAVENOUS

## 2018-09-28 MED ORDER — ASPIRIN 81 MG PO CHEW
CHEWABLE_TABLET | ORAL | Status: AC
  Filled 2018-09-28: qty 2

## 2018-09-28 MED ORDER — ASPIRIN EC 81 MG PO TBEC
81.0000 mg | DELAYED_RELEASE_TABLET | Freq: Every day | ORAL | Status: DC
  Administered 2018-09-29: 81 mg via ORAL
  Filled 2018-09-28: qty 1

## 2018-09-28 MED ORDER — SODIUM CHLORIDE 0.9 % WEIGHT BASED INFUSION
3.0000 mL/kg/h | INTRAVENOUS | Status: DC
  Administered 2018-09-28: 3 mL/kg/h via INTRAVENOUS

## 2018-09-28 MED ORDER — HEPARIN (PORCINE) IN NACL 1000-0.9 UT/500ML-% IV SOLN
INTRAVENOUS | Status: AC
  Filled 2018-09-28: qty 1000

## 2018-09-28 MED ORDER — TICAGRELOR 90 MG PO TABS
ORAL_TABLET | ORAL | Status: DC | PRN
  Administered 2018-09-28: 180 mg via ORAL

## 2018-09-28 MED ORDER — IOPAMIDOL (ISOVUE-300) INJECTION 61%
INTRAVENOUS | Status: DC | PRN
  Administered 2018-09-28: 75 mL via INTRA_ARTERIAL
  Administered 2018-09-28: 100 mL via INTRA_ARTERIAL

## 2018-09-28 MED ORDER — ASPIRIN EC 325 MG PO TBEC: 325.0000 mg | DELAYED_RELEASE_TABLET | Freq: Every day | ORAL | Status: DC

## 2018-09-28 SURGICAL SUPPLY — 15 items
BALLN TREK RX 2.5X12 (BALLOONS) ×3
BALLN ~~LOC~~ TREK RX 3.5X15 (BALLOONS) ×3
BALLOON TREK RX 2.5X12 (BALLOONS) ×1 IMPLANT
BALLOON ~~LOC~~ TREK RX 3.5X15 (BALLOONS) ×1 IMPLANT
CATH INFINITI 5FR JK (CATHETERS) ×3 IMPLANT
CATH VISTA GUIDE 6FR XBLAD3.5 (CATHETERS) ×3 IMPLANT
DEVICE INFLAT 30 PLUS (MISCELLANEOUS) ×3 IMPLANT
DEVICE RAD TR BAND REGULAR (VASCULAR PRODUCTS) ×3 IMPLANT
GLIDESHEATH SLEND A-KIT 6F 22G (SHEATH) IMPLANT
GLIDESHEATH SLEND SS 6F .021 (SHEATH) ×3 IMPLANT
KIT MANI 3VAL PERCEP (MISCELLANEOUS) ×3 IMPLANT
PACK CARDIAC CATH (CUSTOM PROCEDURE TRAY) ×3 IMPLANT
STENT SIERRA 3.25 X 23 MM (Permanent Stent) ×3 IMPLANT
WIRE ROSEN-J .035X260CM (WIRE) ×3 IMPLANT
WIRE RUNTHROUGH .014X180CM (WIRE) ×3 IMPLANT

## 2018-09-28 NOTE — Plan of Care (Signed)
  Problem: Health Behavior/Discharge Planning: Goal: Ability to manage health-related needs will improve Outcome: Progressing   Problem: Clinical Measurements: Goal: Cardiovascular complication will be avoided Outcome: Progressing   Problem: Pain Managment: Goal: General experience of comfort will improve Outcome: Progressing   Problem: Activity: Goal: Ability to tolerate increased activity will improve Outcome: Progressing

## 2018-09-28 NOTE — Progress Notes (Signed)
[facility] Cardiology [facility] [address] Encounter Note  Patient: David Rich / Admit Date: 09/26/2018 / Date of Encounter: 09/28/2018, 8:37 AM   Subjective: Patient felt better overnight.  No evidence of chest pain or further shortness of breath.  Minimal elevation of troponin consistent with non-ST elevation myocardial infarction Catheterization showing normal LV systolic function with ejection fraction of 50% Significant 95% stenosis of mid LAD Moderate stenosis of proximal right of 60% and distal RPDA of 75%  Review of Systems: Positive for: None Negative for: Vision change, hearing change, syncope, dizziness, nausea, vomiting,diarrhea, bloody stool, stomach pain, cough, congestion, diaphoresis, urinary frequency, urinary pain,skin lesions, skin rashes Others previously listed  Objective: Telemetry: Normal sinus rhythm Physical Exam: Blood pressure (!) 153/80, pulse (!) 48, temperature 97.6 F (36.4 C), resp. rate 17, height 6' (1.829 m), weight 90.2 kg, SpO2 97 %. Body mass index is 26.96 kg/m. General: Well developed, well nourished, in no acute distress. Head: Normocephalic, atraumatic, sclera non-icteric, no xanthomas, nares are without discharge. Neck: No apparent masses Lungs: Normal respirations with no wheezes, no rhonchi, no rales , no crackles   Heart: Regular rate and rhythm, normal S1 S2, no murmur, no rub, no gallop, PMI is normal size and placement, carotid upstroke normal without bruit, jugular venous pressure normal Abdomen: Soft, non-tender, non-distended with normoactive bowel sounds. No hepatosplenomegaly. Abdominal aorta is normal size without bruit Extremities: No edema, no clubbing, no cyanosis, no ulcers,  Peripheral: 2+ radial, 2+ femoral, 2+ dorsal pedal pulses Neuro: Alert and oriented. Moves all extremities spontaneously. Psych:  Responds to questions appropriately with a normal affect.   Intake/Output Summary (Last 24 hours) at 09/28/2018 0837 Last data  filed at 09/28/2018 0400 Gross per 24 hour  Intake 1208.69 ml  Output -  Net 1208.69 ml    Inpatient Medications:  . [MAR Hold] amLODipine  5 mg Oral Daily  . [MAR Hold] aspirin EC  325 mg Oral Daily  . [MAR Hold] metoprolol tartrate  25 mg Oral BID  . [MAR Hold] omega-3 acid ethyl esters  1 capsule Oral Daily  . sodium chloride flush  3 mL Intravenous Q12H   Infusions:  . sodium chloride    . sodium chloride    . heparin Stopped (09/28/18 0719)    Labs: Recent Labs    09/26/18 2042  NA 137  K 4.0  CL 105  CO2 25  GLUCOSE 97  BUN 18  CREATININE 1.35*  CALCIUM 9.2   No results for input(s): AST, ALT, ALKPHOS, BILITOT, PROT, ALBUMIN in the last 72 hours. Recent Labs    09/27/18 0753 09/28/18 0458  WBC 8.7 9.7  HGB 14.3 14.7  HCT 44.1 45.3  MCV 96.5 96.0  PLT 208 226   Recent Labs    09/26/18 2042 09/27/18 0047 09/27/18 0408 09/27/18 0753  TROPONINI 0.19* 0.26* 0.26* 0.20*   Invalid input(s): POCBNP No results for input(s): HGBA1C in the last 72 hours.   Weights: Filed Weights   09/26/18 2356 09/28/18 0556 09/28/18 0715  Weight: 92.6 kg 90.2 kg 90.2 kg     Radiology/Studies:  Dg Chest 2 View  Result Date: 09/26/2018 CLINICAL DATA:  Acute onset of generalized chest discomfort and left forearm pain. Personal history of bladder cancer. EXAM: CHEST - 2 VIEW COMPARISON:  PET/CT performed 06/29/2018 FINDINGS: The lungs are well-aerated. Mild scarring is noted at the lung apices. There is no evidence of focal opacification, pleural effusion or pneumothorax. A right-sided chest port is noted ending about  the distal SVC. The heart is normal in size; the mediastinal contour is within normal limits. No acute osseous abnormalities are seen. IMPRESSION: No acute cardiopulmonary process seen. Electronically Signed   By: Garald Balding M.D.   On: 09/26/2018 21:11     Assessment and Recommendation  72 y.o. male with known cardiovascular disease hypertension  hyperlipidemia and non-ST elevation myocardial infarction with significant 95% of mid LAD is culprit stenosis causing current symptoms 1.  PCI and stent placement of mid LAD for non-ST elevation myocardial infarction 2.  Continue metoprolol and ACE inhibitor as able for hypertension control and myocardial infarction 3.  Dual antiplatelet therapy for non-ST elevation myocardial infarction PCI and stent placement 4.  High intensity cholesterol therapy 5.  Begin cardiac rehabilitation  Signed, Serafina Royals M.D. FACC

## 2018-09-28 NOTE — Progress Notes (Signed)
[facility] [place] NAME: David Rich    MR#:  [id number]  DATE OF BIRTH:  08/26/1946  SUBJECTIVE:  Patient denies chest pain, cardiology input appreciated-status post heart catheterization with LAD stent placement   REVIEW OF SYSTEMS:  CONSTITUTIONAL: No fever, fatigue or weakness.  EYES: No blurred or double vision.  EARS, NOSE, AND THROAT: No tinnitus or ear pain.  RESPIRATORY: No cough, shortness of breath, wheezing or hemoptysis.  CARDIOVASCULAR: No chest pain, orthopnea, edema.  GASTROINTESTINAL: No nausea, vomiting, diarrhea or abdominal pain.  GENITOURINARY: No dysuria, hematuria.  ENDOCRINE: No polyuria, nocturia,  HEMATOLOGY: No anemia, easy bruising or bleeding SKIN: No rash or lesion. MUSCULOSKELETAL: No joint pain or arthritis.   NEUROLOGIC: No tingling, numbness, weakness.  PSYCHIATRY: No anxiety or depression.   ROS  DRUG ALLERGIES:   Allergies  Allergen Reactions  . Clopidogrel Rash and Hives  . Rosuvastatin Calcium Anaphylaxis  . Etodolac Nausea And Vomiting and Other (See Comments)    Bad dreams Bad dreams   . Hydrocodone Other (See Comments)    Bad dreams Bad dreams   . Other   . Statins   . Meloxicam Rash    Bad dreams Bad dreams   . Pravastatin Sodium Rash    VITALS:  Blood pressure (!) 159/98, pulse (!) 53, temperature 97.6 F (36.4 C), resp. rate 18, height 6' (1.829 m), weight 90.2 kg, SpO2 98 %.  PHYSICAL EXAMINATION:  GENERAL:  72 y.o.-year-old patient lying in the bed with no acute distress.  EYES: Pupils equal, round, reactive to light and accommodation. No scleral icterus. Extraocular muscles intact.  HEENT: Head atraumatic, normocephalic. Oropharynx and nasopharynx clear.  NECK:  Supple, no jugular venous distention. No thyroid enlargement, no tenderness.  LUNGS: Normal breath sounds bilaterally, no wheezing, rales,rhonchi or crepitation. No use of accessory muscles of respiration.   CARDIOVASCULAR: S1, S2 normal. No murmurs, rubs, or gallops.  ABDOMEN: Soft, nontender, nondistended. Bowel sounds present. No organomegaly or mass.  EXTREMITIES: No pedal edema, cyanosis, or clubbing.  NEUROLOGIC: Cranial nerves II through XII are intact. Muscle strength 5/5 in all extremities. Sensation intact. Gait not checked.  PSYCHIATRIC: The patient is alert and oriented x 3.  SKIN: No obvious rash, lesion, or ulcer.   Physical Exam LABORATORY PANEL:   CBC Recent Labs  Lab 09/28/18 0458  WBC 9.7  HGB 14.7  HCT 45.3  PLT 226   ------------------------------------------------------------------------------------------------------------------  Chemistries  Recent Labs  Lab 09/26/18 2042  NA 137  K 4.0  CL 105  CO2 25  GLUCOSE 97  BUN 18  CREATININE 1.35*  CALCIUM 9.2   ------------------------------------------------------------------------------------------------------------------  Cardiac Enzymes Recent Labs  Lab 09/27/18 0408 09/27/18 0753  TROPONINI 0.26* 0.20*   ------------------------------------------------------------------------------------------------------------------  RADIOLOGY:  Dg Chest 2 View  Result Date: 09/26/2018 CLINICAL DATA:  Acute onset of generalized chest discomfort and left forearm pain. Personal history of bladder cancer. EXAM: CHEST - 2 VIEW COMPARISON:  PET/CT performed 06/29/2018 FINDINGS: The lungs are well-aerated. Mild scarring is noted at the lung apices. There is no evidence of focal opacification, pleural effusion or pneumothorax. A right-sided chest port is noted ending about the distal SVC. The heart is normal in size; the mediastinal contour is within normal limits. No acute osseous abnormalities are seen. IMPRESSION: No acute cardiopulmonary process seen. Electronically Signed   By: Garald Balding M.D.   On: 09/26/2018 21:11    ASSESSMENT AND PLAN:  *Acute NSTEMI with history  of CAD/coronary artery stenting  x1 Continue ACS protocol, aspirin, statin therapy, Lopressor, nitrates as needed, IV morphine PRN breakthrough pain, supplemental oxygen, heparin drip, cardiology following-status post heart catheterization on September 28, 2018-status post LAD stent placement, echocardiogram noted for ejection fraction of 50-55%  *Chronic hypertension Controlled on current regiment  *Chronic hyperlipidemia, unspecified Stable Continue statin therapy  *Chronic kidney disease stage III At baseline Avoid nephrotoxic agents  Disposition to home on tomorrow barring any complications if cleared by cardiology  All the records are reviewed and case discussed with Care Management/Social Workerr. Management plans discussed with the patient, family and they are in agreement.  CODE STATUS:full  TOTAL TIME TAKING CARE OF THIS PATIENT: 40 minutes.    POSSIBLE D/C IN 1-2 DAYS, DEPENDING ON CLINICAL CONDITION.   Avel Peace Chihiro Frey M.D on 09/28/2018   Between 7am to 6pm - Pager - [phone]  After 6pm go to [email or web address] - password [email or web address] [place] [facility]  Office  [phone]  CC: Primary care physician; Juluis Pitch, MD  Note: This dictation was prepared with Dragon dictation along with smaller phrase technology. Any transcriptional errors that result from this process are unintentional.

## 2018-09-28 NOTE — Progress Notes (Signed)
[place] for heparin Indication: chest pain/ACS  Allergies  Allergen Reactions  . Clopidogrel Rash and Hives  . Rosuvastatin Calcium Anaphylaxis  . Etodolac Nausea And Vomiting and Other (See Comments)    Bad dreams Bad dreams   . Hydrocodone Other (See Comments)    Bad dreams Bad dreams   . Other   . Statins   . Meloxicam Rash    Bad dreams Bad dreams   . Pravastatin Sodium Rash    Patient Measurements: Height: 6' (182.9 cm) Weight: 204 lb 1.6 oz (92.6 kg) IBW/kg (Calculated) : 77.6 Heparin Dosing Weight: 92.6 kg  Vital Signs: Temp: 97.7 F (36.5 C) (12/23 0556) Temp Source: Oral (12/23 0556) BP: 120/61 (12/23 0556) Pulse Rate: 53 (12/23 0556)  Labs: Recent Labs    09/26/18 2042 09/27/18 0047 09/27/18 0408 09/27/18 0753 09/27/18 1648 09/28/18 0458  HGB 14.9  --   --  14.3  --  14.7  HCT 46.1  --   --  44.1  --  45.3  PLT 224  --   --  208  --  226  APTT  --  >160*  --   --   --   --   LABPROT  --  13.3  --   --   --   --   INR  --  1.02  --   --   --   --   HEPARINUNFRC  --   --   --  0.38 0.47 0.61  CREATININE 1.35*  --   --   --   --   --   TROPONINI 0.19* 0.26* 0.26* 0.20*  --   --     Estimated Creatinine Clearance: 55.9 mL/min (A) (by C-G formula based on SCr of 1.35 mg/dL (H)).   Medical History: Past Medical History:  Diagnosis Date  . Cancer [facility])    prostate  . Hyperlipidemia   . Hypertension   . Renal disorder     Medications:  Scheduled:  . amLODipine  5 mg Oral Daily  . aspirin  81 mg Oral Pre-Cath  . aspirin EC  325 mg Oral Daily  . metoprolol tartrate  25 mg Oral BID  . omega-3 acid ethyl esters  1 capsule Oral Daily  . sodium chloride flush  3 mL Intravenous Q12H    Assessment: Patient admitted s/t CP w/ radiation down to L arm w/ h/o CAD s/p stent (unsure of time). Initial trops 0.19, EKG showing peaked T-waved and some T-wave inversion. Patient is being started on heparin drip  for probably NSTEMI. No PTA anticoagulation.  Will bolus w/ heparin 4000 units IV x 1 Will start rate at 1200 units/hr  Will draw anti-Xa @ 0800 (8 hrs post start) Baseline labs drawn Will monitor daily CBC's and adjust per anti-Xa levels. Enoxaparin for VTE prophylaxis was ordered in admission orders; this has been d/c'd.  Goal of Therapy:  Heparin level 0.3-0.7 units/ml Monitor platelets by anticoagulation protocol: Yes   Plan:  12/23 @ 0500 HL 0.61 therapeutic, but up from 0.47. Will decrease slightly to 1150 units/hr and will recheck HL @ 1200, CBC stable will continue to monitor.  Tobie Lords, PharmD, BCPS Clinical Pharmacist 09/28/2018

## 2018-09-29 LAB — BASIC METABOLIC PANEL
Anion gap: 7 (ref 5–15)
BUN: 18 mg/dL (ref 8–23)
CHLORIDE: 109 mmol/L (ref 98–111)
CO2: 21 mmol/L — ABNORMAL LOW (ref 22–32)
Calcium: 8.8 mg/dL — ABNORMAL LOW (ref 8.9–10.3)
Creatinine, Ser: 1.13 mg/dL (ref 0.61–1.24)
GFR calc Af Amer: >60 mL/min (ref >60)
GFR calc non Af Amer: >60 mL/min (ref >60)
Glucose, Bld: 95 mg/dL (ref 70–99)
Potassium: 4.1 mmol/L (ref 3.5–5.1)
Sodium: 137 mmol/L (ref 135–145)

## 2018-09-29 LAB — CBC
HCT: 42.6 % (ref 39.0–52.0)
Hemoglobin: 13.9 g/dL (ref 13.0–17.0)
MCH: 31.2 pg (ref 26.0–34.0)
MCHC: 32.6 g/dL (ref 30.0–36.0)
MCV: 95.7 fL (ref 80.0–100.0)
Platelets: 200 10*3/uL (ref 150–400)
RBC: 4.45 MIL/uL (ref 4.22–5.81)
RDW: 13.5 % (ref 11.5–15.5)
WBC: 8.2 10*3/uL (ref 4.0–10.5)
nRBC: 0 % (ref 0.0–0.2)

## 2018-09-29 LAB — HIV ANTIBODY (ROUTINE TESTING W REFLEX): HIV Screen 4th Generation wRfx: NONREACTIVE

## 2018-09-29 MED ORDER — EZETIMIBE 10 MG PO TABS: 10.0000 mg | ORAL_TABLET | Freq: Every day | ORAL | 0 refills | Status: DC

## 2018-09-29 MED ORDER — TICAGRELOR 90 MG PO TABS: 90.0000 mg | ORAL_TABLET | Freq: Two times a day (BID) | ORAL | 0 refills | Status: DC

## 2018-09-29 NOTE — Discharge Summary (Signed)
[name] [place] NAME: David Rich    MR#:  [id number]  DATE OF BIRTH:  08/02/1946  DATE OF ADMISSION:  09/26/2018 ADMITTING PHYSICIAN: Gorden Harms, MD  DATE OF DISCHARGE: No discharge date for patient encounter.  PRIMARY CARE PHYSICIAN: Juluis Pitch, MD    ADMISSION DIAGNOSIS:  Atypical chest pain [R07.89] Elevated troponin [R79.89]  DISCHARGE DIAGNOSIS:  Active Problems:   Chest pain   Non-ST elevation (NSTEMI) myocardial infarction [facility])   SECONDARY DIAGNOSIS:   Past Medical History:  Diagnosis Date  . Cancer [facility])    prostate  . Hyperlipidemia   . Hypertension   . Renal disorder     HOSPITAL COURSE:   *Acute NSTEMI with history of CAD/coronary artery stenting x1 Resolving  Treated on our ACS protocol, did require heparin drip for short period of time, continue DAPT with aspirin/Brilinta, statin allergy noted, continue Zetia, Lopressor, nitrates as needed, successfully weaned off oxygen, cardiology did see patient while in house-status post heart catheterization on September 28, 2018-status post LAD stent placement, echocardiogram noted for ejection fraction of 50-55%  *Chronic hypertension Controlled on current regiment  *Chronic hyperlipidemia, unspecified Stable Continue Zetia, noted statin allergy  *Chronic kidney disease stage III At baseline Avoided nephrotoxic agents  DISCHARGE CONDITIONS:   stable  CONSULTS OBTAINED:  Treatment Team:  [name], MD  DRUG ALLERGIES:   Allergies  Allergen Reactions  . Clopidogrel Rash and Hives  . Rosuvastatin Calcium Anaphylaxis  . Etodolac Nausea And Vomiting and Other (See Comments)    Bad dreams Bad dreams   . Hydrocodone Other (See Comments)    Bad dreams Bad dreams   . Other   . Statins   . Meloxicam Rash    Bad dreams Bad dreams   . Pravastatin Sodium Rash    DISCHARGE MEDICATIONS:   Allergies as of 09/29/2018       Reactions   Clopidogrel Rash, Hives   Rosuvastatin Calcium Anaphylaxis   Etodolac Nausea And Vomiting, Other (See Comments)   Bad dreams Bad dreams   Hydrocodone Other (See Comments)   Bad dreams Bad dreams   Other    Statins    Meloxicam Rash   Bad dreams Bad dreams   Pravastatin Sodium Rash      Medication List    TAKE these medications   acetaminophen 325 MG tablet Commonly known as:  TYLENOL Take by mouth.   amLODipine 2.5 MG tablet Commonly known as:  NORVASC Take 1 tablet (2.5 mg total) by mouth daily.   aspirin EC 81 MG tablet Take by mouth.   ezetimibe 10 MG tablet Commonly known as:  ZETIA Take 1 tablet (10 mg total) by mouth daily. Start taking on:  September 30, 2018   FISH OIL OMEGA-3 1000 MG Caps Take by mouth daily.   Garlic 856 MG Tabs Take by mouth.   lidocaine-prilocaine cream Commonly known as:  EMLA Apply to affected area once   metoCLOPramide 10 MG tablet Commonly known as:  REGLAN Take 1 tablet (10 mg total) by mouth 4 (four) times daily.   metoprolol tartrate 25 MG tablet Commonly known as:  LOPRESSOR Take 25 mg by mouth.   MULTI-VITAMINS Tabs Take by mouth.   ticagrelor 90 MG Tabs tablet Commonly known as:  BRILINTA Take 1 tablet (90 mg total) by mouth 2 (two) times daily.        DISCHARGE INSTRUCTIONS:      If you  experience worsening of your admission symptoms, develop shortness of breath, life threatening emergency, suicidal or homicidal thoughts you must seek medical attention immediately by calling 911 or calling your MD immediately  if symptoms less severe.  You Must read complete instructions/literature along with all the possible adverse reactions/side effects for all the Medicines you take and that have been prescribed to you. Take any new Medicines after you have completely understood and accept all the possible adverse reactions/side effects.   Please note  You were cared for by a hospitalist during  your hospital stay. If you have any questions about your discharge medications or the care you received while you were in the hospital after you are discharged, you can call the unit and asked to speak with the hospitalist on call if the hospitalist that took care of you is not available. Once you are discharged, your primary care physician will handle any further medical issues. Please note that NO REFILLS for any discharge medications will be authorized once you are discharged, as it is imperative that you return to your primary care physician (or establish a relationship with a primary care physician if you do not have one) for your aftercare needs so that they can reassess your need for medications and monitor your lab values.    Today   CHIEF COMPLAINT:   Chief Complaint  Patient presents with  . Chest Pain    HISTORY OF PRESENT ILLNESS:   72 y.o. male with a known history per below, presenting with acute chest pain radiating into the left arm, not associated with shortness of breath or diaphoresis, in the emergency room patient was noted to be hypertensive, troponin elevation of 0.19, EKG was benign, creatinine 1.3, heparin not initiated given absence of EKG findings per ED attending, patient evaluated in the emergency room, in no apparent distress, resting comfortably in bed, patient does complain of mild chest pain anteriorly, denies any shortness of breath currently, no nausea/emesis, patient is now being admitted for acute chest pain suspicious for non-STEMI.   VITAL SIGNS:  Blood pressure 127/78, pulse 68, temperature 97.9 F (36.6 C), temperature source Oral, resp. rate 18, height 6' (1.829 m), weight 90.2 kg, SpO2 98 %.  I/O:    Intake/Output Summary (Last 24 hours) at 09/29/2018 1021 Last data filed at 09/29/2018 0700 Gross per 24 hour  Intake 925.49 ml  Output 1575 ml  Net -649.51 ml    PHYSICAL EXAMINATION:  GENERAL:  72 y.o.-year-old patient lying in the bed with  no acute distress.  EYES: Pupils equal, round, reactive to light and accommodation. No scleral icterus. Extraocular muscles intact.  HEENT: Head atraumatic, normocephalic. Oropharynx and nasopharynx clear.  NECK:  Supple, no jugular venous distention. No thyroid enlargement, no tenderness.  LUNGS: Normal breath sounds bilaterally, no wheezing, rales,rhonchi or crepitation. No use of accessory muscles of respiration.  CARDIOVASCULAR: S1, S2 normal. No murmurs, rubs, or gallops.  ABDOMEN: Soft, non-tender, non-distended. Bowel sounds present. No organomegaly or mass.  EXTREMITIES: No pedal edema, cyanosis, or clubbing.  NEUROLOGIC: Cranial nerves II through XII are intact. Muscle strength 5/5 in all extremities. Sensation intact. Gait not checked.  PSYCHIATRIC: The patient is alert and oriented x 3.  SKIN: No obvious rash, lesion, or ulcer.   DATA REVIEW:   CBC Recent Labs  Lab 09/29/18 0407  WBC 8.2  HGB 13.9  HCT 42.6  PLT 200    Chemistries  Recent Labs  Lab 09/29/18 0407  NA 137  K  4.1  CL 109  CO2 21*  GLUCOSE 95  BUN 18  CREATININE 1.13  CALCIUM 8.8*    Cardiac Enzymes Recent Labs  Lab 09/27/18 0753  TROPONINI 0.20*    Microbiology Results  No results found for this or any previous visit.  RADIOLOGY:  No results found.  EKG:   Orders placed or performed during the hospital encounter of 09/26/18  . EKG 12-Lead  . EKG 12-Lead  . ED EKG within 10 minutes  . ED EKG within 10 minutes  . EKG 12-Lead (Repeat cardiac markers, recurrent chest pain)  . EKG 12-Lead (Repeat cardiac markers, recurrent chest pain)  . EKG  . EKG 12-Lead immediately post procedure  . EKG 12-Lead  . EKG 12-Lead immediately post procedure  . EKG 12-Lead      Management plans discussed with the patient, family and they are in agreement.  CODE STATUS:     Code Status Orders  (From admission, onward)         Start     Ordered   09/26/18 2357  Full code  Continuous      09/26/18 2356        Code Status History    This patient has a current code status but no historical code status.      TOTAL TIME TAKING CARE OF THIS PATIENT: 40 minutes.    Avel Peace Salary M.D on 09/29/2018 at 10:21 AM  Between 7am to 6pm - Pager - [phone]  After 6pm go to [email or web address] - password [email or web address] [facility]  Office  [phone]  CC: Primary care physician; Juluis Pitch, MD   Note: This dictation was prepared with Dragon dictation along with smaller phrase technology. Any transcriptional errors that result from this process are unintentional.

## 2018-09-29 NOTE — Progress Notes (Signed)
[facility] Encounter Note  Patient: David Rich / Admit Date: 09/26/2018 / Date of Encounter: 09/29/2018, 8:20 AM   Subjective: Patient felt better overnight.  No evidence of chest pain or further shortness of breath.  Minimal elevation of troponin consistent with non-ST elevation myocardial infarction Catheterization showing normal LV systolic function with ejection fraction of 50% Significant 95% stenosis of mid LAD Moderate stenosis of proximal right of 60% and distal RPDA of 75% Successful PCI and stent placement of mid left anterior descending artery Review of Systems: Positive for: None Negative for: Vision change, hearing change, syncope, dizziness, nausea, vomiting,diarrhea, bloody stool, stomach pain, cough, congestion, diaphoresis, urinary frequency, urinary pain,skin lesions, skin rashes Others previously listed  Objective: Telemetry: Normal sinus rhythm Physical Exam: Blood pressure 121/65, pulse (!) 58, temperature 97.7 F (36.5 C), temperature source Oral, resp. rate 18, height 6' (1.829 m), weight 90.2 kg, SpO2 93 %. Body mass index is 26.96 kg/m. General: Well developed, well nourished, in no acute distress. Head: Normocephalic, atraumatic, sclera non-icteric, no xanthomas, nares are without discharge. Neck: No apparent masses Lungs: Normal respirations with no wheezes, no rhonchi, no rales , no crackles   Heart: Regular rate and rhythm, normal S1 S2, no murmur, no rub, no gallop, PMI is normal size and placement, carotid upstroke normal without bruit, jugular venous pressure normal Abdomen: Soft, non-tender, non-distended with normoactive bowel sounds. No hepatosplenomegaly. Abdominal aorta is normal size without bruit Extremities: No edema, no clubbing, no cyanosis, no ulcers,  Peripheral: 2+ radial, 2+ femoral, 2+ dorsal pedal pulses Neuro: Alert and oriented. Moves all extremities spontaneously. Psych:  Responds to questions appropriately  with a normal affect.   Intake/Output Summary (Last 24 hours) at 09/29/2018 0820 Last data filed at 09/29/2018 0049 Gross per 24 hour  Intake 925.49 ml  Output 1075 ml  Net -149.51 ml    Inpatient Medications:  . amLODipine  5 mg Oral Daily  . aspirin EC  81 mg Oral Daily  . ezetimibe  10 mg Oral Daily  . metoprolol tartrate  25 mg Oral BID  . omega-3 acid ethyl esters  1 capsule Oral Daily  . sodium chloride flush  3 mL Intravenous Q12H  . ticagrelor  90 mg Oral BID   Infusions:  . sodium chloride      Labs: Recent Labs    09/28/18 1318 09/29/18 0407  NA 133* 137  K 4.1 4.1  CL 106 109  CO2 20* 21*  GLUCOSE 77 95  BUN 18 18  CREATININE 1.00 1.13  CALCIUM 8.8* 8.8*   No results for input(s): AST, ALT, ALKPHOS, BILITOT, PROT, ALBUMIN in the last 72 hours. Recent Labs    09/28/18 0458 09/29/18 0407  WBC 9.7 8.2  HGB 14.7 13.9  HCT 45.3 42.6  MCV 96.0 95.7  PLT 226 200   Recent Labs    09/26/18 2042 09/27/18 0047 09/27/18 0408 09/27/18 0753  TROPONINI 0.19* 0.26* 0.26* 0.20*   Invalid input(s): POCBNP No results for input(s): HGBA1C in the last 72 hours.   Weights: Filed Weights   09/26/18 2356 09/28/18 0556 09/28/18 0715  Weight: 92.6 kg 90.2 kg 90.2 kg     Radiology/Studies:  Dg Chest 2 View  Result Date: 09/26/2018 CLINICAL DATA:  Acute onset of generalized chest discomfort and left forearm pain. Personal history of bladder cancer. EXAM: CHEST - 2 VIEW COMPARISON:  PET/CT performed 06/29/2018 FINDINGS: The lungs are well-aerated. Mild scarring is noted at the lung apices.  There is no evidence of focal opacification, pleural effusion or pneumothorax. A right-sided chest port is noted ending about the distal SVC. The heart is normal in size; the mediastinal contour is within normal limits. No acute osseous abnormalities are seen. IMPRESSION: No acute cardiopulmonary process seen. Electronically Signed   By: Garald Balding M.D.   On: 09/26/2018  21:11     Assessment and Recommendation  72 y.o. male with known cardiovascular disease hypertension hyperlipidemia and non-ST elevation myocardial infarction with significant 95% of mid LAD is culprit stenosis causing current symptoms followed after successful PCI and stent placement of mid left anterior descending artery 1.  No further cardiac diagnostic testing and/or treatment necessary 2.  Continue metoprolol and ACE inhibitor as able for hypertension control and myocardial infarction 3.  Dual antiplatelet therapy for non-ST elevation myocardial infarction PCI and stent placement 4.  High intensity cholesterol therapy 5.  Begin cardiac rehabilitation 6.  Okay for discharge to home today with follow-up in 2weeks for further adjustments of medication management  Signed, Serafina Royals M.D. FACC

## 2018-09-29 NOTE — Plan of Care (Signed)
  Problem: Clinical Measurements: Goal: Cardiovascular complication will be avoided Outcome: Progressing   Problem: Pain Managment: Goal: General experience of comfort will improve Outcome: Progressing Note:  No complaints of pain this shift   Problem: Safety: Goal: Ability to remain free from injury will improve Outcome: Progressing   Problem: Skin Integrity: Goal: Risk for impaired skin integrity will decrease Outcome: Progressing   Problem: Cardiovascular: Goal: Vascular access site(s) Level 0-1 will be maintained Outcome: Progressing Note:  Pt's right radial cath site at a level 0, no bleeding or bruising   Problem: Education: Goal: Knowledge of General Education information will improve Description Including pain rating scale, medication(s)/side effects and non-pharmacologic comfort measures Outcome: Completed/Met   Problem: Activity: Goal: Risk for activity intolerance will decrease Outcome: Completed/Met   Problem: Nutrition: Goal: Adequate nutrition will be maintained Outcome: Completed/Met   Problem: Coping: Goal: Level of anxiety will decrease Outcome: Completed/Met   Problem: Coping: Goal: Level of anxiety will decrease Outcome: Completed/Met

## 2018-09-29 NOTE — Progress Notes (Signed)
Discharge instructions explained to pt/ verbalized an understanding / iv and tele removed/ RX given to pt/ refused wheelchair- pt ambulated off unit

## 2018-09-29 NOTE — Care Management Note (Signed)
Case Management Note  Patient Details  Name: David Rich MRN: [id number] Date of Birth: Jan 17, 1946  Subjective/Objective:     Patient discharging to home today.  He is from home alone.  Independent in all adls, denies issues accessing medical care, obtaining medications or with transportation.  Current with PCP.  Starting on Brilinta.  Coupon given for 30 days free.  No further needs identified at this time.                   Action/Plan:   Expected Discharge Date:  09/29/18               Expected Discharge Plan:  Home/Self Care  In-House Referral:     Discharge planning Services  CM Consult  Post Acute Care Choice:    Choice offered to:     DME Arranged:    DME Agency:     HH Arranged:    HH Agency:     Status of Service:  Completed, signed off  If discussed at [employer] of Stay Meetings, dates discussed:    Additional Comments:  Elza Rafter, RN 09/29/2018, 11:19 AM

## 2018-10-02 ENCOUNTER — Ambulatory Visit
Admission: RE | Admit: 2018-10-02 | Discharge: 2018-10-02 | Disposition: A | Discharge status: Home / Self Care | Payer: Medicare Other | Source: Ambulatory Visit | Attending: Oncology | Admitting: Oncology

## 2018-10-02 DIAGNOSIS — C679 Malignant neoplasm of bladder, unspecified: Secondary | ICD-10-CM | POA: Insufficient documentation

## 2018-10-02 MED ORDER — IOPAMIDOL (ISOVUE-300) INJECTION 61%
100.0000 mL | Freq: Once | INTRAVENOUS | Status: AC | PRN
  Administered 2018-10-02: 100 mL via INTRAVENOUS

## 2018-10-02 NOTE — Progress Notes (Signed)
[place]  [phone] Fax:(336) [phone]  ID: David Rich OB: 1945-12-27  MR#: [id number]  DGU#:[id number]  Patient Care Team: Juluis Pitch, MD as PCP - General (Family Medicine)  CHIEF COMPLAINT: Stage IVa urothelial carcinoma with left supraclavicular lymph node metastasis.  INTERVAL HISTORY: Patient returns to clinic today for further evaluation and discussion of his imaging results.  He recently was in the hospital with a non-STEMI requiring 1 stent, but now is nearly fully recovered and back to his baseline. He has no neurologic complaints.  He denies any recent fevers or illnesses.  He has a fair appetite and denies weight loss.  He has no chest pain or shortness of breath. He denies any nausea, vomiting, constipation, or diarrhea.  He has no urinary complaints.  Patient offers no further specific complaints today.  REVIEW OF SYSTEMS:   Review of Systems  Constitutional: Negative.  Negative for fever, malaise/fatigue and weight loss.  Respiratory: Negative.  Negative for cough and shortness of breath.   Cardiovascular: Negative.  Negative for chest pain and leg swelling.  Gastrointestinal: Negative.  Negative for abdominal pain, constipation, diarrhea, nausea and vomiting.  Genitourinary: Negative.  Negative for dysuria and hematuria.  Musculoskeletal: Negative.  Negative for back pain.  Skin: Negative.  Negative for rash.  Neurological: Negative.  Negative for sensory change, focal weakness, weakness and headaches.  Psychiatric/Behavioral: Negative.  The patient is not nervous/anxious.     As per HPI. Otherwise, a complete review of systems is negative.  PAST MEDICAL HISTORY: Past Medical History:  Diagnosis Date  . Cancer [facility])    prostate  . Hyperlipidemia   . Hypertension   . Renal disorder     PAST SURGICAL HISTORY: Past Surgical History:  Procedure Laterality Date  . CORONARY STENT INTERVENTION N/A 09/28/2018   Procedure:  CORONARY STENT INTERVENTION;  Surgeon: [name], MD;  Location: [place] [facility];  Service: Cardiovascular;  Laterality: N/A;  . CORONARY STENT PLACEMENT    . IR FLUORO GUIDE CV LINE RIGHT  04/03/2018  . kidney removed Left 2013  . LEFT HEART CATH AND CORONARY ANGIOGRAPHY N/A 09/28/2018   Procedure: LEFT HEART CATH AND CORONARY ANGIOGRAPHY;  Surgeon: [name], MD;  Location: [place] [facility];  Service: Cardiovascular;  Laterality: N/A;    FAMILY HISTORY: Family History  Problem Relation Age of Onset  . Coronary artery disease Mother   . Anemia Mother   . Kidney failure Mother   . Subarachnoid hemorrhage Father   . Breast cancer Sister 97  . Hypertension Brother     ADVANCED DIRECTIVES (Y/N):  N  HEALTH MAINTENANCE: Social History   Tobacco Use  . Smoking status: Former Smoker    Packs/day: 1.00    Years: 55.00    Pack years: 55.00    Types: Cigarettes    Last attempt to quit: 03/20/2012    Years since quitting: 6.5  . Smokeless tobacco: Never Used  Substance Use Topics  . Alcohol use: Yes    Comment: Social  . Drug use: No     Colonoscopy:  PAP:  Bone density:  Lipid panel:  Allergies  Allergen Reactions  . Clopidogrel Rash and Hives  . Rosuvastatin Calcium Anaphylaxis  . Etodolac Nausea And Vomiting and Other (See Comments)    Bad dreams Bad dreams   . Hydrocodone Other (See Comments)    Bad dreams Bad dreams   . Other   . Statins   . Meloxicam  Rash    Bad dreams Bad dreams   . Pravastatin Sodium Rash    Current Outpatient Medications  Medication Sig Dispense Refill  . amLODipine (NORVASC) 2.5 MG tablet Take 1 tablet (2.5 mg total) by mouth daily. 7 tablet 0  . aspirin EC 81 MG tablet Take by mouth.    . ezetimibe (ZETIA) 10 MG tablet Take 1 tablet (10 mg total) by mouth daily. 90 tablet 0  . Garlic 643 MG TABS Take by mouth.     . metoprolol tartrate (LOPRESSOR) 25 MG tablet Take 25 mg by mouth.     . Multiple Vitamin  (MULTI-VITAMINS) TABS Take by mouth.    . Omega-3 Fatty Acids (FISH OIL OMEGA-3) 1000 MG CAPS Take by mouth daily.    . ticagrelor (BRILINTA) 90 MG TABS tablet Take 1 tablet (90 mg total) by mouth 2 (two) times daily. 60 tablet 0  . acetaminophen (TYLENOL) 325 MG tablet Take by mouth.    . lidocaine-prilocaine (EMLA) cream Apply to affected area once (Patient not taking: Reported on 09/26/2018) 30 g 3   No current facility-administered medications for this visit.    Facility-Administered Medications Ordered in Other Visits  Medication Dose Route Frequency Provider Last Rate Last Dose  . sodium chloride flush (NS) 0.9 % injection 10 mL  10 mL Intravenous PRN [name], MD   10 mL at 10/05/18 1026    OBJECTIVE: Vitals:   10/05/18 1048 10/05/18 1055  BP: (!) 142/68 (!) 142/78  Pulse:  72  Resp:  18  Temp:  (!) 97.3 F (36.3 C)     There is no height or weight on file to calculate BMI.    ECOG FS:0 - Asymptomatic  General: Well-developed, well-nourished, no acute distress. Eyes: Pink conjunctiva, anicteric sclera. HEENT: Normocephalic, moist mucous membranes. Lungs: Clear to auscultation bilaterally. Heart: Regular rate and rhythm. No rubs, murmurs, or gallops. Abdomen: Soft, nontender, nondistended. No organomegaly noted, normoactive bowel sounds. Musculoskeletal: No edema, cyanosis, or clubbing. Neuro: Alert, answering all questions appropriately. Cranial nerves grossly intact. Skin: No rashes or petechiae noted. Psych: Normal affect.  LAB RESULTS:  Lab Results  Component Value Date   NA 137 09/29/2018   K 4.1 09/29/2018   CL 109 09/29/2018   CO2 21 (L) 09/29/2018   GLUCOSE 95 09/29/2018   BUN 18 09/29/2018   CREATININE 1.13 09/29/2018   CALCIUM 8.8 (L) 09/29/2018   PROT 7.1 07/23/2018   ALBUMIN 4.1 07/23/2018   AST 22 07/23/2018   ALT 24 07/23/2018   ALKPHOS 73 07/23/2018   BILITOT 0.6 07/23/2018   GFRNONAA >60 09/29/2018   GFRAA >60 09/29/2018    Lab  Results  Component Value Date   WBC 8.2 09/29/2018   NEUTROABS 4.2 07/23/2018   HGB 13.9 09/29/2018   HCT 42.6 09/29/2018   MCV 95.7 09/29/2018   PLT 200 09/29/2018     STUDIES: Dg Chest 2 View  Result Date: 09/26/2018 CLINICAL DATA:  Acute onset of generalized chest discomfort and left forearm pain. Personal history of bladder cancer. EXAM: CHEST - 2 VIEW COMPARISON:  PET/CT performed 06/29/2018 FINDINGS: The lungs are well-aerated. Mild scarring is noted at the lung apices. There is no evidence of focal opacification, pleural effusion or pneumothorax. A right-sided chest port is noted ending about the distal SVC. The heart is normal in size; the mediastinal contour is within normal limits. No acute osseous abnormalities are seen. IMPRESSION: No acute cardiopulmonary process seen. Electronically Signed  By: Garald Balding M.D.   On: 09/26/2018 21:11   Ct Chest W Contrast  Result Date: 10/02/2018 CLINICAL DATA:  Restaging urothelial carcinoma of the bladder. History of prostate cancer. EXAM: CT CHEST, ABDOMEN, AND PELVIS WITH CONTRAST TECHNIQUE: Multidetector CT imaging of the chest, abdomen and pelvis was performed following the standard protocol during bolus administration of intravenous contrast. CONTRAST:  142mL ISOVUE-300 IOPAMIDOL (ISOVUE-300) INJECTION 61% COMPARISON:  PET-CT 06/29/2018. FINDINGS: CT CHEST FINDINGS Cardiovascular: Atherosclerosis of the aorta, great vessels and coronary arteries. Right IJ Port-A-Cath extends to the superior cavoatrial junction. No acute vascular findings. The heart size is normal. There is no pericardial effusion. Mediastinum/Nodes: There are no enlarged mediastinal, hilar or axillary lymph nodes. 9 mm subcarinal node on image 32/2 and small right retrocrural nodes are similar to the previous study. There is mild distal esophageal wall thickening. The trachea and thyroid gland appear unremarkable. Lungs/Pleura: There is no pleural effusion or  pneumothorax. There is mild biapical scarring, centrilobular and paraseptal emphysema and mild chronic central airway thickening. 7 mm subpleural nodule in the right lower lobe on image 135/4 is not seen with certainty on previous PET CTs. Additional scattered tiny nodules are stable, mostly perifissural. Musculoskeletal/Chest wall: No chest wall mass or suspicious osseous findings. CT ABDOMEN AND PELVIS FINDINGS Hepatobiliary: The liver is normal in density without suspicious focal abnormality. There is a tiny calcified gallstone. No gallbladder wall thickening or biliary dilatation. Pancreas: Unremarkable. No pancreatic ductal dilatation or surrounding inflammatory changes. Spleen: Normal in size without focal abnormality. Adrenals/Urinary Tract: Both adrenal glands appear normal. Status post right nephroureterectomy. The left kidney has a stable appearance without suspicious findings. There is a tiny cortical cyst in its upper pole. There is mild bladder wall thickening without focal abnormality. Stomach/Bowel: No evidence of bowel wall thickening, distention or surrounding inflammatory change. There are moderate diverticular changes throughout the colon. The appendix appears normal. Vascular/Lymphatic: Retroperitoneal and right pelvic lymphadenopathy has minimally progressed compared with the most recent study. Left periaortic node measures 17 mm on image 78/2 (previously 15 mm). 12 mm left periaortic node on image 91/2 has not significantly changed. There are enlarging right iliac nodes measuring 8 mm on image 108/2 and 10 mm on image 114/2. Aortic and branch vessel atherosclerosis. No acute vascular findings or aneurysm. Reproductive: The prostate gland and seminal vesicles appear normal. Penile prosthesis is noted with stable reservoir in the left lower quadrant. Other: No evidence of abdominal wall mass or hernia. No ascites. Musculoskeletal: No acute or significant osseous findings. IMPRESSION: 1. Slight  progression in retroperitoneal and right pelvic lymphadenopathy, suspicious for progressive metastatic disease. 2. New subpleural right lower lobe pulmonary nodule suspicious for metastatic disease. Other scattered small pulmonary nodules are stable. 3. Stable appearance of the right nephrectomy bed. No suspicious left renal findings. 4. Stable incidental findings including distal esophageal wall thickening, diffuse colonic diverticulosis, Aortic Atherosclerosis (ICD10-I70.0) and Emphysema (ICD10-J43.9). Electronically Signed   By: Richardean Sale M.D.   On: 10/02/2018 12:47   Ct Abdomen Pelvis W Contrast  Result Date: 10/02/2018 CLINICAL DATA:  Restaging urothelial carcinoma of the bladder. History of prostate cancer. EXAM: CT CHEST, ABDOMEN, AND PELVIS WITH CONTRAST TECHNIQUE: Multidetector CT imaging of the chest, abdomen and pelvis was performed following the standard protocol during bolus administration of intravenous contrast. CONTRAST:  158mL ISOVUE-300 IOPAMIDOL (ISOVUE-300) INJECTION 61% COMPARISON:  PET-CT 06/29/2018. FINDINGS: CT CHEST FINDINGS Cardiovascular: Atherosclerosis of the aorta, great vessels and coronary arteries. Right IJ Port-A-Cath extends to  the superior cavoatrial junction. No acute vascular findings. The heart size is normal. There is no pericardial effusion. Mediastinum/Nodes: There are no enlarged mediastinal, hilar or axillary lymph nodes. 9 mm subcarinal node on image 32/2 and small right retrocrural nodes are similar to the previous study. There is mild distal esophageal wall thickening. The trachea and thyroid gland appear unremarkable. Lungs/Pleura: There is no pleural effusion or pneumothorax. There is mild biapical scarring, centrilobular and paraseptal emphysema and mild chronic central airway thickening. 7 mm subpleural nodule in the right lower lobe on image 135/4 is not seen with certainty on previous PET CTs. Additional scattered tiny nodules are stable, mostly  perifissural. Musculoskeletal/Chest wall: No chest wall mass or suspicious osseous findings. CT ABDOMEN AND PELVIS FINDINGS Hepatobiliary: The liver is normal in density without suspicious focal abnormality. There is a tiny calcified gallstone. No gallbladder wall thickening or biliary dilatation. Pancreas: Unremarkable. No pancreatic ductal dilatation or surrounding inflammatory changes. Spleen: Normal in size without focal abnormality. Adrenals/Urinary Tract: Both adrenal glands appear normal. Status post right nephroureterectomy. The left kidney has a stable appearance without suspicious findings. There is a tiny cortical cyst in its upper pole. There is mild bladder wall thickening without focal abnormality. Stomach/Bowel: No evidence of bowel wall thickening, distention or surrounding inflammatory change. There are moderate diverticular changes throughout the colon. The appendix appears normal. Vascular/Lymphatic: Retroperitoneal and right pelvic lymphadenopathy has minimally progressed compared with the most recent study. Left periaortic node measures 17 mm on image 78/2 (previously 15 mm). 12 mm left periaortic node on image 91/2 has not significantly changed. There are enlarging right iliac nodes measuring 8 mm on image 108/2 and 10 mm on image 114/2. Aortic and branch vessel atherosclerosis. No acute vascular findings or aneurysm. Reproductive: The prostate gland and seminal vesicles appear normal. Penile prosthesis is noted with stable reservoir in the left lower quadrant. Other: No evidence of abdominal wall mass or hernia. No ascites. Musculoskeletal: No acute or significant osseous findings. IMPRESSION: 1. Slight progression in retroperitoneal and right pelvic lymphadenopathy, suspicious for progressive metastatic disease. 2. New subpleural right lower lobe pulmonary nodule suspicious for metastatic disease. Other scattered small pulmonary nodules are stable. 3. Stable appearance of the right  nephrectomy bed. No suspicious left renal findings. 4. Stable incidental findings including distal esophageal wall thickening, diffuse colonic diverticulosis, Aortic Atherosclerosis (ICD10-I70.0) and Emphysema (ICD10-J43.9). Electronically Signed   By: Richardean Sale M.D.   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  PDL 1 0%  PLAN:    1. 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  Patient received his last dose of cisplatin and gemcitabine on May 27, 2018.  Because patient had previous nephrectomy, he was given a split dose of cisplatin.  CT scan results from October 02, 2018 reviewed independently and reported as above with mild progression of disease in approximately 2 mm in several retroperitoneal and pelvic lymph nodes.  No intervention is needed at this time.  Unfortunately, given patient's PDL 1 of 0%, immunotherapy will likely not be of benefit.  Return to clinic in 3 months with repeat imaging and further evaluation.    2.  Genetic testing: Referral has been made to genetics and is pending at time of dictation. 3.  Kidney function: Patient's most recent creatinine was within normal limits. 4.  Cardiac disease: Continue follow-up with cardiology as indicated.  Patient expressed understanding and was in agreement with this plan. He also understands that He can call clinic at any time with any questions, concerns, or complaints.   Cancer Staging Urothelial carcinoma of bladder [facility]) Staging form: Urinary Bladder, AJCC 8th Edition - Clinical: Stage IVA [place], cN2, cM1a) - Signed by [name], MD on 04/09/2018   [name], MD   10/07/2018 7:37 AM

## 2018-10-05 ENCOUNTER — Inpatient Hospital Stay: Payer: Medicare Other

## 2018-10-05 ENCOUNTER — Inpatient Hospital Stay: Payer: Medicare Other | Attending: Oncology | Admitting: Oncology

## 2018-10-05 ENCOUNTER — Encounter: Payer: Self-pay | Admitting: Oncology

## 2018-10-05 VITALS — BP 142/78 | HR 72 | Temp 97.3°F | Resp 18

## 2018-10-05 DIAGNOSIS — C679 Malignant neoplasm of bladder, unspecified: Secondary | ICD-10-CM | POA: Diagnosis present

## 2018-10-05 DIAGNOSIS — I10 Essential (primary) hypertension: Secondary | ICD-10-CM

## 2018-10-05 DIAGNOSIS — Z7982 Long term (current) use of aspirin: Secondary | ICD-10-CM

## 2018-10-05 DIAGNOSIS — C770 Secondary and unspecified malignant neoplasm of lymph nodes of head, face and neck: Secondary | ICD-10-CM | POA: Insufficient documentation

## 2018-10-05 DIAGNOSIS — Z95828 Presence of other vascular implants and grafts: Secondary | ICD-10-CM

## 2018-10-05 DIAGNOSIS — Z8546 Personal history of malignant neoplasm of prostate: Secondary | ICD-10-CM | POA: Diagnosis not present

## 2018-10-05 DIAGNOSIS — Z905 Acquired absence of kidney: Secondary | ICD-10-CM

## 2018-10-05 DIAGNOSIS — Z9221 Personal history of antineoplastic chemotherapy: Secondary | ICD-10-CM | POA: Diagnosis not present

## 2018-10-05 DIAGNOSIS — Z87891 Personal history of nicotine dependence: Secondary | ICD-10-CM

## 2018-10-05 DIAGNOSIS — Z79899 Other long term (current) drug therapy: Secondary | ICD-10-CM | POA: Diagnosis not present

## 2018-10-05 MED ORDER — SODIUM CHLORIDE 0.9% FLUSH
10.0000 mL | INTRAVENOUS | Status: AC | PRN
  Administered 2018-10-05: 10 mL via INTRAVENOUS
  Filled 2018-10-05: qty 10

## 2018-10-05 MED ORDER — HEPARIN SOD (PORK) LOCK FLUSH 100 UNIT/ML IV SOLN
INTRAVENOUS | Status: AC
  Filled 2018-10-05: qty 5

## 2018-10-05 MED ORDER — HEPARIN SOD (PORK) LOCK FLUSH 100 UNIT/ML IV SOLN
500.0000 [IU] | Freq: Once | INTRAVENOUS | Status: AC
  Administered 2018-10-05: 500 [IU] via INTRAVENOUS

## 2018-10-07 DIAGNOSIS — C679 Malignant neoplasm of bladder, unspecified: Secondary | ICD-10-CM

## 2018-10-07 HISTORY — DX: Malignant neoplasm of bladder, unspecified: C67.9

## 2018-10-12 DIAGNOSIS — E782 Mixed hyperlipidemia: Secondary | ICD-10-CM | POA: Insufficient documentation

## 2018-10-12 DIAGNOSIS — I2510 Atherosclerotic heart disease of native coronary artery without angina pectoris: Secondary | ICD-10-CM | POA: Insufficient documentation

## 2018-10-12 DIAGNOSIS — I214 Non-ST elevation (NSTEMI) myocardial infarction: Secondary | ICD-10-CM

## 2018-11-18 DIAGNOSIS — G4700 Insomnia, unspecified: Secondary | ICD-10-CM | POA: Insufficient documentation

## 2018-11-18 DIAGNOSIS — F411 Generalized anxiety disorder: Secondary | ICD-10-CM | POA: Insufficient documentation

## 2018-11-23 ENCOUNTER — Inpatient Hospital Stay: Payer: Medicare Other

## 2018-11-23 ENCOUNTER — Inpatient Hospital Stay: Payer: Medicare Other | Attending: Oncology

## 2018-11-23 DIAGNOSIS — Z95828 Presence of other vascular implants and grafts: Secondary | ICD-10-CM

## 2018-11-23 DIAGNOSIS — C679 Malignant neoplasm of bladder, unspecified: Secondary | ICD-10-CM | POA: Insufficient documentation

## 2018-11-23 DIAGNOSIS — Z452 Encounter for adjustment and management of vascular access device: Secondary | ICD-10-CM | POA: Diagnosis present

## 2018-11-23 DIAGNOSIS — C770 Secondary and unspecified malignant neoplasm of lymph nodes of head, face and neck: Secondary | ICD-10-CM | POA: Diagnosis not present

## 2018-11-23 MED ORDER — HEPARIN SOD (PORK) LOCK FLUSH 100 UNIT/ML IV SOLN
INTRAVENOUS | Status: AC
  Filled 2018-11-23: qty 5

## 2018-11-23 MED ORDER — SODIUM CHLORIDE 0.9% FLUSH
10.0000 mL | Freq: Once | INTRAVENOUS | Status: AC
  Administered 2018-11-23: 10 mL via INTRAVENOUS
  Filled 2018-11-23: qty 10

## 2018-11-23 MED ORDER — HEPARIN SOD (PORK) LOCK FLUSH 100 UNIT/ML IV SOLN
500.0000 [IU] | Freq: Once | INTRAVENOUS | Status: AC
  Administered 2018-11-23: 500 [IU] via INTRAVENOUS

## 2018-11-23 NOTE — Progress Notes (Signed)
Survivorship Care Plan visit completed.  Treatment summary reviewed and given to patient.  ASCO answers booklet reviewed and given to patient.  CARE program and Cancer Transitions discussed with patient along with other resources cancer center offers to patients and caregivers.  Patient verbalized understanding.    

## 2018-12-10 ENCOUNTER — Ambulatory Visit (INDEPENDENT_AMBULATORY_CARE_PROVIDER_SITE_OTHER): Payer: Medicare Other | Admitting: Cardiovascular Disease

## 2018-12-10 ENCOUNTER — Encounter: Payer: Self-pay | Admitting: Cardiovascular Disease

## 2018-12-10 VITALS — BP 150/80 | HR 70 | Ht 72.0 in | Wt 199.5 lb

## 2018-12-10 DIAGNOSIS — I739 Peripheral vascular disease, unspecified: Secondary | ICD-10-CM

## 2018-12-10 DIAGNOSIS — E785 Hyperlipidemia, unspecified: Secondary | ICD-10-CM

## 2018-12-10 DIAGNOSIS — I10 Essential (primary) hypertension: Secondary | ICD-10-CM

## 2018-12-10 DIAGNOSIS — I2510 Atherosclerotic heart disease of native coronary artery without angina pectoris: Secondary | ICD-10-CM

## 2018-12-10 NOTE — Progress Notes (Signed)
Cardiology Office Note   Date:  12/10/2018   ID:  David Rich, DOB 1945/11/03, MRN [id number]  PCP:  Juluis Pitch, MD  Cardiologist:   Kathlyn Sacramento, MD   Chief Complaint  Patient presents with  . New Patient (Initial Visit)    self referral - wants to establish care, Dr Fletcher Anon did stents - pt seeing Dr. Nehemiah Massed but wants to switch to Dr Fletcher Anon. Meds reviewed verbally with patient.       History of Present Illness: David Rich is a 73 y.o. male who presents to establish cardiovascular care.  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  He is a previous smoker and quit about 5 years ago.  Family history is negative for premature coronary artery disease. He had a small non-ST elevation myocardial infarction in December 2019.  Echo showed an EF of 50 to 55%.  Cardiac catheterization showed 95% proximal LAD stenosis, moderate 60% disease in the mid RCA and mild left circumflex disease.  I performed successful angioplasty and drug-eluting stent placement to the proximal LAD.  He has been doing well since then with no recent chest pain or shortness of breath.  He could not attend cardiac rehab due to his schedule.  He reports that his blood pressure is high at the physician's office.  He has known history of hyperlipidemia with multiple reactions to statins.  He is currently on Zetia. He reports right leg weakness and discomfort with walking which limits his physical activities.  He is not aware of peripheral arterial disease.    Past Medical History:  Diagnosis Date  . Cancer [facility])    prostate  . Hyperlipidemia   . Hypertension   . Renal disorder     Past Surgical History:  Procedure Laterality Date  . CORONARY STENT INTERVENTION N/A 09/28/2018   Procedure: CORONARY STENT INTERVENTION;  Surgeon: [name], MD;  Location: [place] [facility];  Service: Cardiovascular;  Laterality: N/A;  . CORONARY  STENT PLACEMENT    . IR FLUORO GUIDE CV LINE RIGHT  04/03/2018  . kidney removed Left 2013  . LEFT HEART CATH AND CORONARY ANGIOGRAPHY N/A 09/28/2018   Procedure: LEFT HEART CATH AND CORONARY ANGIOGRAPHY;  Surgeon: [name], MD;  Location: [place] [facility];  Service: Cardiovascular;  Laterality: N/A;     Current Outpatient Medications  Medication Sig Dispense Refill  . acetaminophen (TYLENOL) 325 MG tablet Take by mouth.    [name] aspirin EC 81 MG tablet Take by mouth.    . clopidogrel (PLAVIX) 75 MG tablet Take 75 mg by mouth daily.    [name] ezetimibe (ZETIA) 10 MG tablet Take 1 tablet (10 mg total) by mouth daily. 90 tablet 0  . Garlic 542 MG TABS Take by mouth.     . lidocaine-prilocaine (EMLA) cream Apply to affected area once 30 g 3  . metoprolol tartrate (LOPRESSOR) 25 MG tablet Take 25 mg by mouth.     . Multiple Vitamin (MULTI-VITAMINS) TABS Take by mouth.    . nitroGLYCERIN (NITROSTAT) 0.4 MG SL tablet Place under the tongue.    . Omega-3 Fatty Acids (FISH OIL OMEGA-3) 1000 MG CAPS Take by mouth daily.    [name] amLODipine (NORVASC) 2.5 MG tablet Take 1 tablet (2.5 mg total) by mouth daily. 7 tablet 0   No current facility-administered medications for this visit.    Facility-Administered Medications Ordered in Other Visits  Medication  Dose Route Frequency Provider Last Rate Last Dose  . sodium chloride flush (NS) 0.9 % injection 10 mL  10 mL Intravenous PRN [name], MD   10 mL at 10/05/18 1026    Allergies:   Clopidogrel; Rosuvastatin calcium; Etodolac; Hydrocodone; Other; Statins; Meloxicam; and Pravastatin sodium    Social History:  The patient  reports that he quit smoking about 6 years ago. His smoking use included cigarettes. He has a 55.00 pack-year smoking history. He has never used smokeless tobacco. He reports current alcohol use. He reports that he does not use drugs.   Family History:  The patient's family history includes Anemia in his mother; Breast  cancer (age of onset: 86) in his sister; Coronary artery disease in his mother; Hypertension in his brother; Kidney failure in his mother; Subarachnoid hemorrhage in his father.    ROS:  Please see the history of present illness.   Otherwise, review of systems are positive for none.   All other systems are reviewed and negative.    PHYSICAL EXAM: VS:  BP (!) 150/80 (BP Location: Right Arm, Patient Position: Sitting, Cuff Size: Normal)   Pulse 70   Ht 6' (1.829 m)   Wt 199 lb 8 oz (90.5 kg)   BMI 27.06 kg/m  , BMI Body mass index is 27.06 kg/m. GEN: Well nourished, well developed, in no acute distress  HEENT: normal  Neck: no JVD, carotid bruits, or masses Cardiac: RRR; no murmurs, rubs, or gallops,no edema  Respiratory:  clear to auscultation bilaterally, normal work of breathing GI: soft, nontender, nondistended, + BS MS: no deformity or atrophy  Skin: warm and dry, no rash Neuro:  Strength and sensation are intact Psych: euthymic mood, full affect Vascular: Radial pulses +1 on the right and +2 on the left.  Femoral pulses +2 bilaterally.  Distal pulses are  palpable on the left but not the right.  EKG:  EKG is ordered today. The ekg ordered today demonstrates normal sinus rhythm with old septal infarct.   Recent Labs: 07/23/2018: ALT 24 09/29/2018: BUN 18; Creatinine, Ser 1.13; Hemoglobin 13.9; Platelets 200; Potassium 4.1; Sodium 137    Lipid Panel No results found for: CHOL, TRIG, HDL, CHOLHDL, VLDL, LDLCALC, LDLDIRECT    Wt Readings from Last 3 Encounters:  12/10/18 199 lb 8 oz (90.5 kg)  09/28/18 198 lb 12.8 oz (90.2 kg)  07/01/18 200 lb 12.8 oz (91.1 kg)      PAD Screen 12/10/2018  Previous PAD dx? No  Previous surgical procedure? No  Pain with walking? No  Feet/toe relief with dangling? No  Painful, non-healing ulcers? No  Extremities discolored? No      ASSESSMENT AND PLAN:  1.  Coronary artery disease involving native coronary arteries without  angina: He is overall doing well.  Unfortunately, he could not attend cardiac rehab due to his schedule.  Continue dual antiplatelet therapy for at least 1 year.  2.  Hyperlipidemia: Allergies and intolerance to statins: Currently on Zetia 10 mg daily.  I reviewed most recent lipid profile which showed a triglyceride of 195 and an LDL of 123.  I discussed with him the option of treatment with a PCSK9 inhibitor to get his LDL below 70 but he prefers to wait for now.  3.  Essential hypertension: Blood pressure is elevated today and it seems like his blood pressure tends to be on the high side.  We can consider increasing amlodipine or switching metoprolol to carvedilol down the road.  4.  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  I agreed to provide the patient with a handicap sticker for 6 months.   Disposition:   FU with me in 3 months  Signed,  Kathlyn Sacramento, MD  12/10/2018 9:22 AM    [place]

## 2018-12-10 NOTE — Patient Instructions (Signed)
Medication Instructions:  No changes If you need a refill on your cardiac medications before your next appointment, please call your pharmacy.   Lab work: None ordered If you have labs (blood work) drawn today and your tests are completely normal, you will receive your results only by: [name] MyChart Message (if you have MyChart) OR . A paper copy in the mail If you have any lab test that is abnormal or we need to change your treatment, we will call you to review the results.  Testing/Procedures: Your physician has requested that you have a lower extremity segmental doppler. This will take place at [place] [id number], the [facility] office.   Follow-Up: At [facility], you and your health needs are our priority.  As part of our continuing mission to provide you with exceptional heart care, we have created designated Provider Care Teams.  These Care Teams include your primary Cardiologist (physician) and Advanced Practice Providers (APPs -  Physician Assistants and Nurse Practitioners) who all work together to provide you with the care you need, when you need it. You will need a follow up appointment in 3 months. You may see Dr. Fletcher Anon or one of the following Advanced Practice Providers on your designated Care Team:   Murray Hodgkins, NP Christell Faith, PA-C . Marrianne Mood, PA-C

## 2018-12-24 ENCOUNTER — Other Ambulatory Visit: Payer: Self-pay | Admitting: Cardiovascular Disease

## 2018-12-24 DIAGNOSIS — I739 Peripheral vascular disease, unspecified: Secondary | ICD-10-CM

## 2018-12-27 ENCOUNTER — Other Ambulatory Visit: Payer: Self-pay | Admitting: Cardiovascular Disease

## 2019-01-04 ENCOUNTER — Other Ambulatory Visit: Payer: Self-pay

## 2019-01-05 ENCOUNTER — Ambulatory Visit
Admission: RE | Admit: 2019-01-05 | Discharge: 2019-01-05 | Disposition: A | Discharge status: Home / Self Care | Payer: Medicare Other | Source: Ambulatory Visit | Attending: Oncology | Admitting: Oncology

## 2019-01-05 ENCOUNTER — Inpatient Hospital Stay: Payer: Medicare Other | Attending: Oncology

## 2019-01-05 ENCOUNTER — Other Ambulatory Visit: Payer: Self-pay

## 2019-01-05 DIAGNOSIS — C679 Malignant neoplasm of bladder, unspecified: Secondary | ICD-10-CM | POA: Diagnosis present

## 2019-01-05 DIAGNOSIS — Z95828 Presence of other vascular implants and grafts: Secondary | ICD-10-CM

## 2019-01-05 LAB — COMPREHENSIVE METABOLIC PANEL
ALT: 25 U/L (ref 0–44)
AST: 22 U/L (ref 15–41)
Albumin: 4.3 g/dL (ref 3.5–5.0)
Alkaline Phosphatase: 86 U/L (ref 38–126)
Anion gap: 9 (ref 5–15)
BUN: 13 mg/dL (ref 8–23)
CO2: 22 mmol/L (ref 22–32)
CREATININE: 1.13 mg/dL (ref 0.61–1.24)
Calcium: 9 mg/dL (ref 8.9–10.3)
Chloride: 105 mmol/L (ref 98–111)
GFR calc Af Amer: >60 mL/min (ref >60)
GFR calc non Af Amer: >60 mL/min (ref >60)
Glucose, Bld: 93 mg/dL (ref 70–99)
Potassium: 4.1 mmol/L (ref 3.5–5.1)
Sodium: 136 mmol/L (ref 135–145)
Total Bilirubin: 0.6 mg/dL (ref 0.3–1.2)
Total Protein: 7.7 g/dL (ref 6.5–8.1)

## 2019-01-05 LAB — CBC WITH DIFFERENTIAL/PLATELET
Abs Immature Granulocytes: 0.02 10*3/uL (ref 0.00–0.07)
Basophils Absolute: 0.1 10*3/uL (ref 0.0–0.1)
Basophils Relative: 1 %
Eosinophils Absolute: 0.3 10*3/uL (ref 0.0–0.5)
Eosinophils Relative: 3 %
HEMATOCRIT: 42 % (ref 39.0–52.0)
Hemoglobin: 14 g/dL (ref 13.0–17.0)
Immature Granulocytes: 0 %
Lymphocytes Relative: 27 %
Lymphs Abs: 2.3 10*3/uL (ref 0.7–4.0)
MCH: 32.2 pg (ref 26.0–34.0)
MCHC: 33.3 g/dL (ref 30.0–36.0)
MCV: 96.6 fL (ref 80.0–100.0)
MONOS PCT: 9 %
Monocytes Absolute: 0.8 10*3/uL (ref 0.1–1.0)
Neutro Abs: 5.2 10*3/uL (ref 1.7–7.7)
Neutrophils Relative %: 60 %
Platelets: 230 10*3/uL (ref 150–400)
RBC: 4.35 MIL/uL (ref 4.22–5.81)
RDW: 14.7 % (ref 11.5–15.5)
WBC: 8.6 10*3/uL (ref 4.0–10.5)
nRBC: 0 % (ref 0.0–0.2)

## 2019-01-05 LAB — POCT I-STAT CREATININE: Creatinine, Ser: 1.2 mg/dL (ref 0.61–1.24)

## 2019-01-05 MED ORDER — SODIUM CHLORIDE 0.9% FLUSH
10.0000 mL | Freq: Once | INTRAVENOUS | Status: AC
  Administered 2019-01-05: 10 mL via INTRAVENOUS
  Filled 2019-01-05: qty 10

## 2019-01-05 MED ORDER — HEPARIN SOD (PORK) LOCK FLUSH 100 UNIT/ML IV SOLN
500.0000 [IU] | Freq: Once | INTRAVENOUS | Status: AC
  Administered 2019-01-05: 500 [IU] via INTRAVENOUS

## 2019-01-05 MED ORDER — IOHEXOL 300 MG/ML  SOLN
100.0000 mL | Freq: Once | INTRAMUSCULAR | Status: AC | PRN
  Administered 2019-01-05: 100 mL via INTRAVENOUS

## 2019-01-06 ENCOUNTER — Other Ambulatory Visit: Payer: Self-pay

## 2019-01-07 ENCOUNTER — Inpatient Hospital Stay: Payer: Medicare Other | Attending: Oncology | Admitting: Oncology

## 2019-01-07 DIAGNOSIS — C770 Secondary and unspecified malignant neoplasm of lymph nodes of head, face and neck: Secondary | ICD-10-CM

## 2019-01-07 DIAGNOSIS — C679 Malignant neoplasm of bladder, unspecified: Secondary | ICD-10-CM | POA: Diagnosis not present

## 2019-01-07 NOTE — Progress Notes (Signed)
[place]  Telephone:([phone]) [phone] Fax:(336) [phone]  ID: David Rich OB: 1946/04/13  MR#: [id number]  TKZ#:[id number]  Patient Care Team: Baxter Hire, MD as PCP - General (Internal Medicine) [name], MD as Consulting Physician (Oncology)  Virtual Visit via Telephone Note  I connected with David Rich on 01/09/19 at 10:45 AM EDT by telephone and verified that I am speaking with the correct person using two identifiers.   I discussed the limitations, risks, security and privacy concerns of performing an evaluation and management service by telephone and the availability of in person appointments. I also discussed with the patient that there may be a patient responsible charge related to this service. The patient expressed understanding and agreed to proceed.   CHIEF COMPLAINT: Stage IVa urothelial carcinoma with left supraclavicular lymph node metastasis.  INTERVAL HISTORY: Patient agreed to be evaluated and discuss his imaging results by telephone.  His daughter is on the line with him today.  He currently feels well and is asymptomatic. He has no neurologic complaints.  He denies any recent fevers or illnesses.  He has a fair appetite and denies weight loss.  He denies any chest pain, shortness of breath, cough, or hemoptysis.  He denies any nausea, vomiting, constipation, or diarrhea.  He has no urinary complaints.  Patient offers no specific complaints today.  REVIEW OF SYSTEMS:   Review of Systems  Constitutional: Negative.  Negative for fever, malaise/fatigue and weight loss.  Respiratory: Negative.  Negative for cough and shortness of breath.   Cardiovascular: Negative.  Negative for chest pain and leg swelling.  Gastrointestinal: Negative.  Negative for abdominal pain, constipation, diarrhea, nausea and vomiting.  Genitourinary: Negative.  Negative for dysuria and hematuria.  Musculoskeletal: Negative.  Negative for back pain.  Skin:  Negative.  Negative for rash.  Neurological: Negative.  Negative for sensory change, focal weakness, weakness and headaches.  Psychiatric/Behavioral: Negative.  The patient is not nervous/anxious.     As per HPI. Otherwise, a complete review of systems is negative.  PAST MEDICAL HISTORY: Past Medical History:  Diagnosis Date   Cancer [facility])    prostate   Hyperlipidemia    Hypertension    Renal disorder     PAST SURGICAL HISTORY: Past Surgical History:  Procedure Laterality Date   CORONARY STENT INTERVENTION N/A 09/28/2018   Procedure: CORONARY STENT INTERVENTION;  Surgeon: [name], MD;  Location: [facility];  Service: Cardiovascular;  Laterality: N/A;   CORONARY STENT PLACEMENT     IR FLUORO GUIDE CV LINE RIGHT  04/03/2018   kidney removed Left 2013   LEFT HEART CATH AND CORONARY ANGIOGRAPHY N/A 09/28/2018   Procedure: LEFT HEART CATH AND CORONARY ANGIOGRAPHY;  Surgeon: [name], MD;  Location: [facility];  Service: Cardiovascular;  Laterality: N/A;    FAMILY HISTORY: Family History  Problem Relation Age of Onset   Coronary artery disease Mother    Anemia Mother    Kidney failure Mother    Subarachnoid hemorrhage Father    Breast cancer Sister 41   Hypertension Brother     ADVANCED DIRECTIVES (Y/N):  N  HEALTH MAINTENANCE: Social History   Tobacco Use   Smoking status: Former Smoker    Packs/day: 1.00    Years: 55.00    Pack years: 55.00    Types: Cigarettes    Last attempt to quit: 03/20/2012    Years since quitting: 6.8   Smokeless tobacco: Never Used  Substance Use Topics   Alcohol use: Yes    Comment: Social   Drug use: No     Colonoscopy:  PAP:  Bone density:  Lipid panel:  Allergies  Allergen Reactions   Clopidogrel Rash and Hives   Rosuvastatin Calcium Anaphylaxis   Etodolac Nausea And Vomiting and Other (See Comments)    Bad dreams Bad dreams    Hydrocodone Other (See Comments)      Bad dreams Bad dreams    Other    Statins    Meloxicam Rash    Bad dreams Bad dreams    Pravastatin Sodium Rash    Current Outpatient Medications  Medication Sig Dispense Refill   acetaminophen (TYLENOL) 325 MG tablet Take by mouth.     aspirin EC 81 MG tablet Take by mouth.     clopidogrel (PLAVIX) 75 MG tablet Take 75 mg by mouth daily.     ezetimibe (ZETIA) 10 MG tablet TAKE 1 TABLET BY MOUTH DAILY 90 tablet 0   Garlic 650 MG TABS Take by mouth.      lidocaine-prilocaine (EMLA) cream Apply to affected area once 30 g 3   metoprolol tartrate (LOPRESSOR) 25 MG tablet Take 25 mg by mouth.      Multiple Vitamin (MULTI-VITAMINS) TABS Take by mouth.     nitroGLYCERIN (NITROSTAT) 0.4 MG SL tablet Place under the tongue.     Omega-3 Fatty Acids (FISH OIL OMEGA-3) 1000 MG CAPS Take by mouth daily.     amLODipine (NORVASC) 2.5 MG tablet Take 1 tablet (2.5 mg total) by mouth daily. 7 tablet 0   No current facility-administered medications for this visit.    Facility-Administered Medications Ordered in Other Visits  Medication Dose Route Frequency Provider Last Rate Last Dose   sodium chloride flush (NS) 0.9 % injection 10 mL  10 mL Intravenous PRN [name], MD   10 mL at 10/05/18 1026    OBJECTIVE: There were no vitals filed for this visit.   There is no height or weight on file to calculate BMI.    ECOG FS:0 - Asymptomatic  LAB RESULTS:  Lab Results  Component Value Date   NA 136 01/05/2019   K 4.1 01/05/2019   CL 105 01/05/2019   CO2 22 01/05/2019   GLUCOSE 93 01/05/2019   BUN 13 01/05/2019   CREATININE 1.20 01/05/2019   CALCIUM 9.0 01/05/2019   PROT 7.7 01/05/2019   ALBUMIN 4.3 01/05/2019   AST 22 01/05/2019   ALT 25 01/05/2019   ALKPHOS 86 01/05/2019   BILITOT 0.6 01/05/2019   GFRNONAA >60 01/05/2019   GFRAA >60 01/05/2019    Lab Results  Component Value Date   WBC 8.6 01/05/2019   NEUTROABS 5.2 01/05/2019   HGB 14.0 01/05/2019    HCT 42.0 01/05/2019   MCV 96.6 01/05/2019   PLT 230 01/05/2019     STUDIES: Ct Chest W Contrast  Result Date: 01/05/2019 CLINICAL DATA:  Followup metastatic bladder carcinoma. Completed chemotherapy. Restaging. EXAM: CT CHEST, ABDOMEN, AND PELVIS WITH CONTRAST TECHNIQUE: Multidetector CT imaging of the chest, abdomen and pelvis was performed following the standard protocol during bolus administration of intravenous contrast. CONTRAST:  151mL OMNIPAQUE IOHEXOL 300 MG/ML  SOLN COMPARISON:  10/02/2018 FINDINGS: CT CHEST FINDINGS Cardiovascular: No acute findings. Aortic and coronary artery atherosclerosis. Mediastinum/Lymph Nodes: Stable 11 mm left supraclavicular lymph node. No evidence of mediastinal, hilar, or axillary lymphadenopathy. Stable diffuse esophageal wall thickening, consistent with esophagitis. Lungs/Pleura: Mild emphysema again noted. Significant  increase in sub-cm pulmonary nodules are seen in both lungs since previous study, consistent with pulmonary metastasis. No evidence of pulmonary infiltrate or pleural effusion. Musculoskeletal:  No suspicious bone lesions identified. CT ABDOMEN AND PELVIS FINDINGS Hepatobiliary: No masses identified. Mild diffuse hepatic steatosis again seen. Probable focal areas of fatty sparing are again seen in the inferior right lobe, without change. Gallbladder is unremarkable. No evidence of biliary ductal dilatation. Pancreas:  No mass or inflammatory changes. Spleen:  Within normal limits in size and appearance. Adrenals/Urinary tract: Normal adrenal glands. Previous right nephrectomy. Stable tiny subcapsular cyst in lateral upper pole of the left kidney. No evidence of renal mass or hydronephrosis. An irregular soft tissue mass is seen involving the distal right ureter which measures 11 x 10 mm, is not significantly changed compared to prior exam. Unremarkable unopacified urinary bladder. Penile prosthesis is seen with reservoir in the left suprapubic  region. An old penile prosthesis reservoir is also seen in the midline suprapubic region. Stomach/Bowel: No evidence of obstruction, inflammatory process, or abnormal fluid collections. Normal appendix visualized. Diffuse colonic diverticulosis is again demonstrated, without evidence of diverticulitis. Vascular/Lymphatic: Abdominal retroperitoneal lymphadenopathy again seen with largest lymph node in the left paraaortic region measuring 1.8 cm, stable since prior exam. New mild lymphadenopathy is seen in the retrocaval space, with largest lymph node measuring 9 mm on image 81/2. New mild right common iliac lymphadenopathy is seen, with largest lymph node measuring 8 mm on image 99/2. Right external iliac lymphadenopathy shows no significant change, with index lymph node measuring 11 mm on image 115/2. Mild right retrocrural lymphadenopathy is also stable. Aortic atherosclerosis. Reproductive:  No mass or other significant abnormality identified. Other:  None. Musculoskeletal:  No suspicious bone lesions identified. IMPRESSION: Significant increase in bilateral sub-cm pulmonary nodules, consistent with pulmonary metastases. Slight increase in abdominal retroperitoneal and right iliac lymphadenopathy. Stable 11 mm left supraclavicular lymph node. Stable 11 mm spiculated soft tissue nodule involving the distal right ureter. Previous right nephrectomy. Stable diffuse esophagitis. Electronically Signed   By: Earle Gell M.D.   On: 01/05/2019 13:17   Ct Abdomen Pelvis W Contrast  Result Date: 01/05/2019 CLINICAL DATA:  Followup metastatic bladder carcinoma. Completed chemotherapy. Restaging. EXAM: CT CHEST, ABDOMEN, AND PELVIS WITH CONTRAST TECHNIQUE: Multidetector CT imaging of the chest, abdomen and pelvis was performed following the standard protocol during bolus administration of intravenous contrast. CONTRAST:  116mL OMNIPAQUE IOHEXOL 300 MG/ML  SOLN COMPARISON:  10/02/2018 FINDINGS: CT CHEST FINDINGS  Cardiovascular: No acute findings. Aortic and coronary artery atherosclerosis. Mediastinum/Lymph Nodes: Stable 11 mm left supraclavicular lymph node. No evidence of mediastinal, hilar, or axillary lymphadenopathy. Stable diffuse esophageal wall thickening, consistent with esophagitis. Lungs/Pleura: Mild emphysema again noted. Significant increase in sub-cm pulmonary nodules are seen in both lungs since previous study, consistent with pulmonary metastasis. No evidence of pulmonary infiltrate or pleural effusion. Musculoskeletal:  No suspicious bone lesions identified. CT ABDOMEN AND PELVIS FINDINGS Hepatobiliary: No masses identified. Mild diffuse hepatic steatosis again seen. Probable focal areas of fatty sparing are again seen in the inferior right lobe, without change. Gallbladder is unremarkable. No evidence of biliary ductal dilatation. Pancreas:  No mass or inflammatory changes. Spleen:  Within normal limits in size and appearance. Adrenals/Urinary tract: Normal adrenal glands. Previous right nephrectomy. Stable tiny subcapsular cyst in lateral upper pole of the left kidney. No evidence of renal mass or hydronephrosis. An irregular soft tissue mass is seen involving the distal right ureter which measures 11 x 10 mm,  is not significantly changed compared to prior exam. Unremarkable unopacified urinary bladder. Penile prosthesis is seen with reservoir in the left suprapubic region. An old penile prosthesis reservoir is also seen in the midline suprapubic region. Stomach/Bowel: No evidence of obstruction, inflammatory process, or abnormal fluid collections. Normal appendix visualized. Diffuse colonic diverticulosis is again demonstrated, without evidence of diverticulitis. Vascular/Lymphatic: Abdominal retroperitoneal lymphadenopathy again seen with largest lymph node in the left paraaortic region measuring 1.8 cm, stable since prior exam. New mild lymphadenopathy is seen in the retrocaval space, with largest  lymph node measuring 9 mm on image 81/2. New mild right common iliac lymphadenopathy is seen, with largest lymph node measuring 8 mm on image 99/2. Right external iliac lymphadenopathy shows no significant change, with index lymph node measuring 11 mm on image 115/2. Mild right retrocrural lymphadenopathy is also stable. Aortic atherosclerosis. Reproductive:  No mass or other significant abnormality identified. Other:  None. Musculoskeletal:  No suspicious bone lesions identified. IMPRESSION: Significant increase in bilateral sub-cm pulmonary nodules, consistent with pulmonary metastases. Slight increase in abdominal retroperitoneal and right iliac lymphadenopathy. Stable 11 mm left supraclavicular lymph node. Stable 11 mm spiculated soft tissue nodule involving the distal right ureter. Previous right nephrectomy. Stable diffuse esophagitis. Electronically Signed   By: Earle Gell M.D.   On: 01/05/2019 13:17    ASSESSMENT: Stage IVa urothelial carcinoma with left supraclavicular lymph node metastasis.  PDL 1 0%  PLAN:    1. 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  Patient received his last dose of cisplatin and gemcitabine on May 27, 2018.  Because patient had previous nephrectomy, he was given a split dose of cisplatin.  CT scan results from January 05, 2019 reviewed independently and report as above with increase in bilateral subcentimeter pulmonary nodules likely consistent with progressive pulmonary disease.  After lengthy discussion with the patient and his daughter, is agreed upon that despite mildly progressive disease there is not enough tumor burden to warrant systemic chemotherapy at this time.  Patient acknowledged that he likely will require treatment in the near future.  No intervention is needed at this time.  Return to clinic in 3 months with repeat imaging and further evaluation.  2.   Genetic testing: Referral has been made to genetics. 3.  Kidney function: Patient's most recent creatinine was 1.2 which is within normal limits. 4.  Cardiac disease: Continue follow-up with cardiology as indicated.   I discussed the assessment and treatment plan with the patient. The patient was provided an opportunity to ask questions and all were answered. The patient agreed with the plan and demonstrated an understanding of the instructions.   The patient was advised to call back or seek an in-person evaluation if the symptoms worsen or if the condition fails to improve as anticipated.  I provided 25 minutes of non-face-to-face time during this encounter.   Patient expressed understanding and was in agreement with this plan. He also understands that He can call clinic at any time with any questions, concerns, or complaints.   Cancer Staging Urothelial carcinoma of bladder [facility]) Staging form: Urinary Bladder, AJCC 8th Edition - Clinical: Stage IVA [place], cN2, cM1a) - Signed by [name], MD on 04/09/2018   [name], MD   01/09/2019 7:17 AM

## 2019-01-07 NOTE — Progress Notes (Signed)
Telephone visit for results.

## 2019-02-18 ENCOUNTER — Other Ambulatory Visit: Payer: Self-pay

## 2019-02-18 ENCOUNTER — Inpatient Hospital Stay: Payer: Medicare Other | Attending: Oncology

## 2019-02-18 ENCOUNTER — Encounter (INDEPENDENT_AMBULATORY_CARE_PROVIDER_SITE_OTHER): Payer: Self-pay

## 2019-02-18 DIAGNOSIS — Z452 Encounter for adjustment and management of vascular access device: Secondary | ICD-10-CM | POA: Diagnosis present

## 2019-02-18 DIAGNOSIS — Z95828 Presence of other vascular implants and grafts: Secondary | ICD-10-CM

## 2019-02-18 DIAGNOSIS — C679 Malignant neoplasm of bladder, unspecified: Secondary | ICD-10-CM | POA: Insufficient documentation

## 2019-02-18 MED ORDER — HEPARIN SOD (PORK) LOCK FLUSH 100 UNIT/ML IV SOLN
500.0000 [IU] | Freq: Once | INTRAVENOUS | Status: AC
  Administered 2019-02-18: 500 [IU] via INTRAVENOUS

## 2019-02-18 MED ORDER — SODIUM CHLORIDE 0.9% FLUSH
10.0000 mL | Freq: Once | INTRAVENOUS | Status: AC
  Administered 2019-02-18: 11:00:00 10 mL via INTRAVENOUS
  Filled 2019-02-18: qty 10

## 2019-03-04 ENCOUNTER — Telehealth: Payer: Self-pay | Admitting: *Deleted

## 2019-03-04 NOTE — Telephone Encounter (Signed)
David Rich called reporting scattered abdominal pain and back pain and would like for the CT scan for 6/29 moved up. He is having normal bowel movements and does not have any n/v. She is concerned that his cancer is spreading. Please advise.

## 2019-03-04 NOTE — Telephone Encounter (Signed)
That's fine. Can move everything up to next week.

## 2019-03-05 ENCOUNTER — Telehealth: Payer: Self-pay

## 2019-03-05 NOTE — Progress Notes (Signed)
[place]  [phone] [phone]) [phone]  ID: [name] OB: 28-Aug-1946  MR#: [id number]  JJO#:[id number]  Patient Care Team: Baxter Hire, MD as PCP - General (Internal Medicine) [name], MD as PCP - Cardiology (Cardiology) [name], MD as Consulting Physician (Oncology)  I connected with [name] on 03/13/19 at 11:15 AM EDT by video enabled telemedicine visit and verified that I am speaking with the correct person using two identifiers.   I discussed the limitations, risks, security and privacy concerns of performing an evaluation and management service by telemedicine and the availability of in-person appointments. I also discussed with the patient that there may be a patient responsible charge related to this service. The patient expressed understanding and agreed to proceed.   Other persons participating in the visit and their role in the encounter: Patient, patient's daughter, MD  Patients location: Home Providers location: Clinic  CHIEF COMPLAINT: Stage IVa urothelial carcinoma with left supraclavicular lymph node metastasis.  INTERVAL HISTORY: Patient agreed for evaluation and discussion of his imaging results via video and able telemedicine.  His daughter is on the line with him today.  He currently feels well and is asymptomatic. He has no neurologic complaints.  He denies any recent fevers or illnesses.  He has a fair appetite and denies weight loss.  He denies any chest pain, shortness of breath, cough, or hemoptysis.  He denies any nausea, vomiting, constipation, or diarrhea.  He has no urinary complaints.  Patient feels that his baseline offers no specific complaints today.  REVIEW OF SYSTEMS:   Review of Systems  Constitutional: Negative.  Negative for fever, malaise/fatigue and weight loss.  Respiratory: Negative.  Negative for cough and shortness of breath.   Cardiovascular: Negative.  Negative for  chest pain and leg swelling.  Gastrointestinal: Negative.  Negative for abdominal pain, constipation, diarrhea, nausea and vomiting.  Genitourinary: Negative.  Negative for dysuria and hematuria.  Musculoskeletal: Negative.  Negative for back pain.  Skin: Negative.  Negative for rash.  Neurological: Negative.  Negative for sensory change, focal weakness, weakness and headaches.  Psychiatric/Behavioral: Negative.  The patient is not nervous/anxious.     As per HPI. Otherwise, a complete review of systems is negative.  PAST MEDICAL HISTORY: Past Medical History:  Diagnosis Date   Cancer [facility])    prostate   Hyperlipidemia    Hypertension    Renal disorder     PAST SURGICAL HISTORY: Past Surgical History:  Procedure Laterality Date   CORONARY STENT INTERVENTION N/A 09/28/2018   Procedure: CORONARY STENT INTERVENTION;  Surgeon: [name], MD;  Location: [place] [facility];  Service: Cardiovascular;  Laterality: N/A;   CORONARY STENT PLACEMENT     IR FLUORO GUIDE CV LINE RIGHT  04/03/2018   kidney removed Left 2013   LEFT HEART CATH AND CORONARY ANGIOGRAPHY N/A 09/28/2018   Procedure: LEFT HEART CATH AND CORONARY ANGIOGRAPHY;  Surgeon: [name], MD;  Location: [place] [facility];  Service: Cardiovascular;  Laterality: N/A;    FAMILY HISTORY: Family History  Problem Relation Age of Onset   Coronary artery disease Mother    Anemia Mother    Kidney failure Mother    Subarachnoid hemorrhage Father    Breast cancer Sister 37   Hypertension Brother     ADVANCED DIRECTIVES (Y/N):  N  HEALTH MAINTENANCE: Social History   Tobacco Use   Smoking status: Former Smoker    Packs/day: 1.00  Years: 55.00    Pack years: 55.00    Types: Cigarettes    Last attempt to quit: 03/20/2012    Years since quitting: 6.9   Smokeless tobacco: Never Used  Substance Use Topics   Alcohol use: Yes    Comment: Social   Drug use: No      Colonoscopy:  PAP:  Bone density:  Lipid panel:  Allergies  Allergen Reactions   Clopidogrel Rash and Hives   Rosuvastatin Calcium Anaphylaxis   Etodolac Nausea And Vomiting and Other (See Comments)    Bad dreams Bad dreams    Hydrocodone Other (See Comments)    Bad dreams Bad dreams    Other    Statins    Meloxicam Rash    Bad dreams Bad dreams    Pravastatin Sodium Rash    Current Outpatient Medications  Medication Sig Dispense Refill   acetaminophen (TYLENOL) 325 MG tablet Take 325 mg by mouth every 4 (four) hours as needed.      amLODipine (NORVASC) 10 MG tablet TAKE 1 TABLET BY MOUTH EVERY DAY     aspirin EC 81 MG tablet Take by mouth.     carvedilol (COREG) 6.25 MG tablet Take 1 tablet (6.25 mg total) by mouth 2 (two) times daily. 60 tablet 6   clopidogrel (PLAVIX) 75 MG tablet Take 75 mg by mouth daily.     ezetimibe (ZETIA) 10 MG tablet TAKE 1 TABLET BY MOUTH DAILY 90 tablet 0   famotidine (PEPCID) 20 MG tablet Take 20 mg by mouth 2 (two) times daily.      Garlic 357 MG TABS Take by mouth.      lidocaine-prilocaine (EMLA) cream Apply to affected area once 30 g 3   Multiple Vitamin (MULTI-VITAMINS) TABS Take by mouth.     nitroGLYCERIN (NITROSTAT) 0.4 MG SL tablet Place under the tongue.     Omega-3 Fatty Acids (FISH OIL OMEGA-3) 1000 MG CAPS Take by mouth daily.     No current facility-administered medications for this visit.    Facility-Administered Medications Ordered in Other Visits  Medication Dose Route Frequency Provider Last Rate Last Dose   sodium chloride flush (NS) 0.9 % injection 10 mL  10 mL Intravenous PRN [name], MD   10 mL at 10/05/18 1026    OBJECTIVE: There were no vitals filed for this visit.   There is no height or weight on file to calculate BMI.    ECOG FS:0 - Asymptomatic   General: Well-developed, well-nourished, no acute distress. HEENT: Normocephalic. Neuro: Alert, answering all questions  appropriately. Cranial nerves grossly intact. Skin: No rashes or petechiae noted. Psych: Normal affect.  LAB RESULTS:  Lab Results  Component Value Date   NA 137 03/11/2019   K 3.7 03/11/2019   CL 104 03/11/2019   CO2 24 03/11/2019   GLUCOSE 91 03/11/2019   BUN 13 03/11/2019   CREATININE 1.12 03/11/2019   CALCIUM 8.6 (L) 03/11/2019   PROT 7.6 03/11/2019   ALBUMIN 4.1 03/11/2019   AST 18 03/11/2019   ALT 20 03/11/2019   ALKPHOS 97 03/11/2019   BILITOT 0.6 03/11/2019   GFRNONAA >60 03/11/2019   GFRAA >60 03/11/2019    Lab Results  Component Value Date   WBC 8.7 03/11/2019   NEUTROABS 5.1 03/11/2019   HGB 13.5 03/11/2019   HCT 41.1 03/11/2019   MCV 95.1 03/11/2019   PLT 224 03/11/2019     STUDIES: Ct Chest W Contrast  Result Date: 03/11/2019 CLINICAL  DATA:  Restaging uroepithelial cancer status post nephrectomy EXAM: CT CHEST, ABDOMEN, AND PELVIS WITH CONTRAST TECHNIQUE: Multidetector CT imaging of the chest, abdomen and pelvis was performed following the standard protocol during bolus administration of intravenous contrast. CONTRAST:  178mL OMNIPAQUE IOHEXOL 300 MG/ML  SOLN COMPARISON:  CT scan 01/05/2019 FINDINGS: CT CHEST FINDINGS Cardiovascular: The heart is normal in size. No pericardial effusion. Stable mild tortuosity and calcification of the thoracic aorta but no aneurysm or dissection. The branch vessels are patent. Mediastinum/Nodes: Stable small scattered mediastinal and hilar lymph nodes but no mass or overt adenopathy. The esophagus is grossly normal. Lungs/Pleura: Stable emphysematous changes and areas of pulmonary scarring. Again noted are numerous clustered pulmonary nodules in both lungs consistent with metastatic disease. Most of these nodules are relatively stable but some have enlarged and there are new nodules. 8 mm right middle lobe nodule on image number 87 previously measured 6.5 mm. Clustered irregular nodular density in the right lower lobe on image  number 111 measures 18.5 mm and previously measured 14.5 mm. Bilobed nodular density in the left lower lobe on image number 137 measures 17 mm and is new. 5.5 mm right lower lobe nodule on image number 132 is new. Musculoskeletal: No chest wall mass is identified. Left supraclavicular lymph nodes are again demonstrated. The larger more anterior node on image number 5 measures 10.5 mm and is unchanged. Slightly more inferiorly there is an 8.5 mm nodule which previously measured 8 mm. No axillary adenopathy. Stable right-sided Port-A-Cath. There is a new central depression type fracture or large Smalls node involving L2. Could not exclude the possibility of a pathologic fracture. It appeared completely normal on the prior CT scan from March. MRI lumbar spine without and with contrast may be helpful for further evaluation CT ABDOMEN PELVIS FINDINGS Hepatobiliary: No focal hepatic lesions or intrahepatic biliary dilatation. Gallbladder appears normal. No common bile duct dilatation. Pancreas: No mass, inflammation or ductal dilatation. Spleen: Normal size.  No focal lesions. Adrenals/Urinary Tract: The adrenal glands are normal in stable. Status post right nephro ureterectomy. No findings for residual tumor in the renal fossa. The left kidney is unremarkable. No worrisome renal lesions or collecting system abnormality on the delayed images. Stomach/Bowel: The stomach, duodenum, small bowel and colon are grossly normal. No inflammatory changes, mass lesions or obstructive findings. Advanced sigmoid colon diverticulosis but no findings for acute diverticulitis. The terminal ileum and appendix are normal. Vascular/Lymphatic: Stable appearing retroperitoneal lymphadenopathy. The dominant left para-aortic node on image number 77 measures 17 mm and previously measured 18 mm. More inferiorly near the iliac artery bifurcation an 11 mm left-sided retroperitoneal node on image number 90 previously measured 12 mm. Right-sided  pelvic adenopathy appears stable. 10 mm external iliac lymph node on image number 105 is unchanged. 13 mm right external iliac lymph node on image number 110 previously measured 11.5 mm. Soft tissue lesion in the region of the resected right ureter distally on image number 114 measures 13 x 9.5 mm and previously measured 11.5 x 10 mm. Adjacent 8 mm node on image number 113 is stable. Reproductive: The prostate gland and seminal vesicles are unremarkable. Penile prosthesis is noted. Other: Small inguinal hernias containing fat. No inguinal adenopathy. Musculoskeletal: No worrisome lytic or sclerotic bone lesions involving the pelvis. IMPRESSION: 1. Slight interval progression of pulmonary metastatic disease with some enlarging and some new pulmonary nodules. 2. Stable left supraclavicular lymph nodes. 3. Overall stable retroperitoneal and pelvic lymphadenopathy. 4. New compression fracture of L2.  Suspicious for a pathologic fracture. MRI lumbar spine without and with contrast may be helpful for further evaluation. No spinal canal compromise. Electronically Signed   By: Marijo Sanes M.D.   On: 03/11/2019 17:03   Ct Abdomen Pelvis W Contrast  Result Date: 03/11/2019 CLINICAL DATA:  Restaging uroepithelial cancer status post nephrectomy EXAM: CT CHEST, ABDOMEN, AND PELVIS WITH CONTRAST TECHNIQUE: Multidetector CT imaging of the chest, abdomen and pelvis was performed following the standard protocol during bolus administration of intravenous contrast. CONTRAST:  138mL OMNIPAQUE IOHEXOL 300 MG/ML  SOLN COMPARISON:  CT scan 01/05/2019 FINDINGS: CT CHEST FINDINGS Cardiovascular: The heart is normal in size. No pericardial effusion. Stable mild tortuosity and calcification of the thoracic aorta but no aneurysm or dissection. The branch vessels are patent. Mediastinum/Nodes: Stable small scattered mediastinal and hilar lymph nodes but no mass or overt adenopathy. The esophagus is grossly normal. Lungs/Pleura: Stable  emphysematous changes and areas of pulmonary scarring. Again noted are numerous clustered pulmonary nodules in both lungs consistent with metastatic disease. Most of these nodules are relatively stable but some have enlarged and there are new nodules. 8 mm right middle lobe nodule on image number 87 previously measured 6.5 mm. Clustered irregular nodular density in the right lower lobe on image number 111 measures 18.5 mm and previously measured 14.5 mm. Bilobed nodular density in the left lower lobe on image number 137 measures 17 mm and is new. 5.5 mm right lower lobe nodule on image number 132 is new. Musculoskeletal: No chest wall mass is identified. Left supraclavicular lymph nodes are again demonstrated. The larger more anterior node on image number 5 measures 10.5 mm and is unchanged. Slightly more inferiorly there is an 8.5 mm nodule which previously measured 8 mm. No axillary adenopathy. Stable right-sided Port-A-Cath. There is a new central depression type fracture or large Smalls node involving L2. Could not exclude the possibility of a pathologic fracture. It appeared completely normal on the prior CT scan from March. MRI lumbar spine without and with contrast may be helpful for further evaluation CT ABDOMEN PELVIS FINDINGS Hepatobiliary: No focal hepatic lesions or intrahepatic biliary dilatation. Gallbladder appears normal. No common bile duct dilatation. Pancreas: No mass, inflammation or ductal dilatation. Spleen: Normal size.  No focal lesions. Adrenals/Urinary Tract: The adrenal glands are normal in stable. Status post right nephro ureterectomy. No findings for residual tumor in the renal fossa. The left kidney is unremarkable. No worrisome renal lesions or collecting system abnormality on the delayed images. Stomach/Bowel: The stomach, duodenum, small bowel and colon are grossly normal. No inflammatory changes, mass lesions or obstructive findings. Advanced sigmoid colon diverticulosis but no  findings for acute diverticulitis. The terminal ileum and appendix are normal. Vascular/Lymphatic: Stable appearing retroperitoneal lymphadenopathy. The dominant left para-aortic node on image number 77 measures 17 mm and previously measured 18 mm. More inferiorly near the iliac artery bifurcation an 11 mm left-sided retroperitoneal node on image number 90 previously measured 12 mm. Right-sided pelvic adenopathy appears stable. 10 mm external iliac lymph node on image number 105 is unchanged. 13 mm right external iliac lymph node on image number 110 previously measured 11.5 mm. Soft tissue lesion in the region of the resected right ureter distally on image number 114 measures 13 x 9.5 mm and previously measured 11.5 x 10 mm. Adjacent 8 mm node on image number 113 is stable. Reproductive: The prostate gland and seminal vesicles are unremarkable. Penile prosthesis is noted. Other: Small inguinal hernias containing fat. No inguinal  adenopathy. Musculoskeletal: No worrisome lytic or sclerotic bone lesions involving the pelvis. IMPRESSION: 1. Slight interval progression of pulmonary metastatic disease with some enlarging and some new pulmonary nodules. 2. Stable left supraclavicular lymph nodes. 3. Overall stable retroperitoneal and pelvic lymphadenopathy. 4. New compression fracture of L2. Suspicious for a pathologic fracture. MRI lumbar spine without and with contrast may be helpful for further evaluation. No spinal canal compromise. Electronically Signed   By: Marijo Sanes M.D.   On: 03/11/2019 17:03   Vas [place] Lower Ext Art Seg Multi (segmentals & [place])  Result Date: 03/08/2019 LOWER EXTREMITY DOPPLER STUDY Indications: Patient states that he has pain and cramping in his right leg when              walking varying distances. High Risk Factors: Hypertension, hyperlipidemia, past history of smoking,                    coronary artery disease.  Performing Technologist: Wilkie Aye RVT  Examination Guidelines: A  complete evaluation includes at minimum, Doppler waveform signals and systolic blood pressure reading at the level of bilateral brachial, anterior tibial, and posterior tibial arteries, when vessel segments are accessible. Bilateral testing is considered an integral part of a complete examination. Photoelectric Plethysmograph (PPG) waveforms and toe systolic pressure readings are included as required and additional duplex testing as needed. Limited examinations for reoccurring indications may be performed as noted.  ABI Findings: +---------+------------------+-----+-------------------+--------+  Right     Rt Pressure (mmHg) Index Waveform            Comment   +---------+------------------+-----+-------------------+--------+  Brachial  133                                                    +---------+------------------+-----+-------------------+--------+  CFA                                triphasic                     +---------+------------------+-----+-------------------+--------+  Popliteal                          dampened monophasic           +---------+------------------+-----+-------------------+--------+  ATA       81                 0.61  monophasic                    +---------+------------------+-----+-------------------+--------+  PTA       92                 0.69  dampened monophasic           +---------+------------------+-----+-------------------+--------+  PERO      77                 0.58  monophasic                    +---------+------------------+-----+-------------------+--------+  Great Toe 81                 0.61  Abnormal                      +---------+------------------+-----+-------------------+--------+ +---------+------------------+-----+---------+-------+  Left      Lt Pressure (mmHg) Index Waveform  Comment  +---------+------------------+-----+---------+-------+  Brachial  127                                         +---------+------------------+-----+---------+-------+  CFA                                 triphasic          +---------+------------------+-----+---------+-------+  Popliteal                          triphasic          +---------+------------------+-----+---------+-------+  ATA       122                0.92  biphasic           +---------+------------------+-----+---------+-------+  PTA       134                1.01  biphasic           +---------+------------------+-----+---------+-------+  PERO      159                1.20  biphasic           +---------+------------------+-----+---------+-------+  Great Toe 106                0.80  Normal             +---------+------------------+-----+---------+-------+ +-------+-----------+-----------+------------+------------+  ABI/TBI Today's ABI Today's TBI Previous ABI Previous TBI  +-------+-----------+-----------+------------+------------+  Right   0.69        0.61                                   +-------+-----------+-----------+------------+------------+  Left    1.20        0.80                                   +-------+-----------+-----------+------------+------------+  Summary: Right: Resting right ankle-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 The right toe-brachial index is abnormal. Left: Resting left ankle-brachial index is within normal range. No evidence of significant left lower extremity arterial disease. The left toe-brachial index is normal.  *See table(s) above for measurements and observations.  Vascular consult recommended. Electronically signed by Quay Burow MD on 03/08/2019 at 1:41:41 PM.    Final    Vas [place] Lower Extremity Arterial Duplex  Result Date: 03/08/2019 LOWER EXTREMITY ARTERIAL DUPLEX STUDY Indications: Patient states that he has pain and cramping in his right leg when              walking varying distances. High Risk Factors: Hypertension, hyperlipidemia, past history of smoking,                    coronary artery disease.  Current ABI: Today the right ABI was 0.69 and left  1.20 Performing Technologist: Wilkie Aye RVT  Examination Guidelines: A complete evaluation includes B-mode imaging, spectral Doppler, color Doppler, and power Doppler as needed of all accessible portions of each vessel. Bilateral testing is considered an integral  part of a complete examination. Limited examinations for reoccurring indications may be performed as noted.  +-----------+--------+-----+--------+----------+--------+  RIGHT       PSV cm/s Ratio Stenosis Waveform   Comments  +-----------+--------+-----+--------+----------+--------+  CFA Prox    151                     triphasic            +-----------+--------+-----+--------+----------+--------+  CFA Distal  73                      triphasic            +-----------+--------+-----+--------+----------+--------+  DFA         163                     biphasic             +-----------+--------+-----+--------+----------+--------+  SFA Prox    43                      biphasic             +-----------+--------+-----+--------+----------+--------+  SFA Mid     91                      biphasic             +-----------+--------+-----+--------+----------+--------+  SFA Distal                 occluded                      +-----------+--------+-----+--------+----------+--------+  POP Prox    18                      monophasic           +-----------+--------+-----+--------+----------+--------+  POP Distal  17                      monophasic           +-----------+--------+-----+--------+----------+--------+  TP Trunk    26                      monophasic           +-----------+--------+-----+--------+----------+--------+  ATA Prox    34                      monophasic           +-----------+--------+-----+--------+----------+--------+  PTA Distal  20                      monophasic           +-----------+--------+-----+--------+----------+--------+  PERO Distal 16                      monophasic           +-----------+--------+-----+--------+----------+--------+   +-----------+--------+-----+--------+--------+--------+  LEFT        PSV cm/s Ratio Stenosis Waveform Comments  +-----------+--------+-----+--------+--------+--------+  CFA Prox    145                     biphasic           +-----------+--------+-----+--------+--------+--------+  CFA Distal  82  biphasic           +-----------+--------+-----+--------+--------+--------+  DFA         75                      biphasic           +-----------+--------+-----+--------+--------+--------+  SFA Prox    77                      biphasic           +-----------+--------+-----+--------+--------+--------+  SFA Mid     102                     biphasic           +-----------+--------+-----+--------+--------+--------+  SFA Distal  109                     biphasic           +-----------+--------+-----+--------+--------+--------+  POP Prox    91                      biphasic           +-----------+--------+-----+--------+--------+--------+  POP Distal  62                      biphasic           +-----------+--------+-----+--------+--------+--------+  TP Trunk    70                      biphasic           +-----------+--------+-----+--------+--------+--------+  ATA Distal  94                      biphasic           +-----------+--------+-----+--------+--------+--------+  PTA Prox    79                      biphasic           +-----------+--------+-----+--------+--------+--------+  PERO Distal 40                      biphasic           +-----------+--------+-----+--------+--------+--------+  Summary: Right: Atherosclerosis in the common femoral, femoral, popliteal and tibial arteries. Short segment occlusion in the mid/distal SFA with reconstitution at the above knee popliteal. Three vessel run off. Left: Atherosclerosis in the common femoral, femoral, popliteal and tibial arteries with no focal stenosis. Three vessel run off.  See table(s) above for measurements and observations. Vascular consult recommended.  Electronically signed by Quay Burow MD on 03/08/2019 at 1:38:25 PM.    Final     ASSESSMENT: Stage IVa urothelial carcinoma with left supraclavicular lymph node metastasis.  PDL 1 0%  PLAN:    1. 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  Patient received his last dose of cisplatin and gemcitabine on May 27, 2018.  Because patient had previous nephrectomy, he was given a split dose of cisplatin.  CT scan results from March 11, 2019 reviewed independently and reported as above with continued increase in size and number of pulmonary nodules.  This is consistent with progressive metastatic disease.  After lengthy discussion with the patient and his daughter, he does not wish  to delay further treatment and would like to reinitiate cisplatin and gemcitabine.  Return to clinic on March 25, 2019 for further evaluation and initiation of cycle 1, day 1 of treatment.  2.  Genetic testing: Negative. 3.  Kidney function: Patient's creatinine continues to be within normal limits. 4.  Cardiac disease: Continue follow-up with cardiology as indicated.   I provided 25 minutes of face-to-face video visit time during this encounter, and > 50% was spent counseling as documented under my assessment & plan.   Patient expressed understanding and was in agreement with this plan. He also understands that He can call clinic at any time with any questions, concerns, or complaints.   Cancer Staging Urothelial carcinoma of bladder [facility]) Staging form: Urinary Bladder, AJCC 8th Edition - Clinical: Stage IVA [place], cN2, cM1a) - Signed by [name], MD on 04/09/2018   [name], MD   03/13/2019 11:20 AM

## 2019-03-05 NOTE — Telephone Encounter (Signed)
Virtual Visit Pre-Appointment Phone Call  "Avishai, I am calling you today to discuss your upcoming appointment. We are currently trying to limit exposure to the virus that causes COVID-19 by seeing patients at home rather than in the office."  1. "What is the BEST phone number to call the day of the visit?" - include this in appointment notes  2. "Do you have or have access to (through a family member/friend) a smartphone with video capability that we can use for your visit?" a. If yes - list this number in appt notes as "cell" (if different from BEST phone #) and list the appointment type as a VIDEO visit in appointment notes b. If no - list the appointment type as a PHONE visit in appointment notes  3. Confirm consent - "In the setting of the current Covid19 crisis, you are scheduled for a video visit with your provider on March 12, 2019 at 9:40AM.  Just as we do with many in-office visits, in order for you to participate in this visit, we must obtain consent.  If you'd like, I can send this to your mychart (if signed up) or email for you to review.  Otherwise, I can obtain your verbal consent now.  All virtual visits are billed to your insurance company just like a normal visit would be.  By agreeing to a virtual visit, we'd like you to understand that the technology does not allow for your provider to perform an examination, and thus may limit your provider's ability to fully assess your condition. If your provider identifies any concerns that need to be evaluated in person, we will make arrangements to do so.  Finally, though the technology is pretty good, we cannot assure that it will always work on either your or our end, and in the setting of a video visit, we may have to convert it to a phone-only visit.  In either situation, we cannot ensure that we have a secure connection.  Are you willing to proceed?" STAFF: Did the patient verbally acknowledge consent to telehealth visit? Document YES/NO  here: YES  4. Advise patient to be prepared - "Two hours prior to your appointment, go ahead and check your blood pressure, pulse, oxygen saturation, and your weight (if you have the equipment to check those) and write them all down. When your visit starts, your provider will ask you for this information. If you have an Apple Watch or Kardia device, please plan to have heart rate information ready on the day of your appointment. Please have a pen and paper handy nearby the day of the visit as well."  5. Give patient instructions for MyChart download to smartphone OR Doximity/Doxy.me as below if video visit (depending on what platform provider is using)  6. Inform patient they will receive a phone call 15 minutes prior to their appointment time (may be from unknown caller ID) so they should be prepared to answer    [place] has been deemed a candidate for a follow-up tele-health visit to limit community exposure during the Covid-19 pandemic. I spoke with the patient via phone to ensure availability of phone/video source, confirm preferred email & phone number, and discuss instructions and expectations.  I reminded [name] to be prepared with any vital sign and/or heart rhythm information that could potentially be obtained via home monitoring, at the time of his visit. I reminded [name] to expect a phone call prior to  his visit.  David Rich 03/05/2019 11:29 AM   INSTRUCTIONS FOR DOWNLOADING THE MYCHART APP TO SMARTPHONE  - The patient must first make sure to have activated MyChart and know their login information - If Apple, go to [employer] and type in MyChart in the search bar and download the app. If Android, ask patient to go to [employer] and type in [place] in the search bar and download the app. The app is free but as with any other app downloads, their phone may require them to verify saved payment information or Apple/Android  password.  - The patient will need to then log into the app with their MyChart username and password, and select [facility] as their healthcare provider to link the account. When it is time for your visit, go to the MyChart app, find appointments, and click Begin Video Visit. Be sure to Select Allow for your device to access the Microphone and Camera for your visit. You will then be connected, and your provider will be with you shortly.  **If they have any issues connecting, or need assistance please contact MyChart service desk [phone])**  **If using a computer, in order to ensure the best quality for their visit they will need to use either of the following Internet Browsers: [employer], or Google Chrome**  IF USING DOXIMITY or DOXY.ME - The patient will receive a link just prior to their visit by text.     FULL LENGTH CONSENT FOR TELE-HEALTH VISIT   I hereby voluntarily request, consent and authorize [facility] and its employed or contracted physicians, physician assistants, nurse practitioners or other licensed health care professionals (the Practitioner), to provide me with telemedicine health care services (the "Services") as deemed necessary by the treating Practitioner. I acknowledge and consent to receive the Services by the Practitioner via telemedicine. I understand that the telemedicine visit will involve communicating with the Practitioner through live audiovisual communication technology and the disclosure of certain medical information by electronic transmission. I acknowledge that I have been given the opportunity to request an in-person assessment or other available alternative prior to the telemedicine visit and am voluntarily participating in the telemedicine visit.  I understand that I have the right to withhold or withdraw my consent to the use of telemedicine in the course of my care at any time, without affecting my right to future care or treatment,  and that the Practitioner or I may terminate the telemedicine visit at any time. I understand that I have the right to inspect all information obtained and/or recorded in the course of the telemedicine visit and may receive copies of available information for a reasonable fee.  I understand that some of the potential risks of receiving the Services via telemedicine include:  [name] Delay or interruption in medical evaluation due to technological equipment failure or disruption; . Information transmitted may not be sufficient (e.g. poor resolution of images) to allow for appropriate medical decision making by the Practitioner; and/or  . In rare instances, security protocols could fail, causing a breach of personal health information.  Furthermore, I acknowledge that it is my responsibility to provide information about my medical history, conditions and care that is complete and accurate to the best of my ability. I acknowledge that Practitioner's advice, recommendations, and/or decision may be based on factors not within their control, such as incomplete or inaccurate data provided by me or distortions of diagnostic images or specimens that may result from electronic transmissions.  I understand that the practice of medicine is not an exact science and that Practitioner makes no warranties or guarantees regarding treatment outcomes. I acknowledge that I will receive a copy of this consent concurrently upon execution via email to the email address I last provided but may also request a printed copy by calling the office of [facility].    I understand that my insurance will be billed for this visit.   I have read or had this consent read to me. . I understand the contents of this consent, which adequately explains the benefits and risks of the Services being provided via telemedicine.  . I have been provided ample opportunity to ask questions regarding this consent and the Services and have had my questions  answered to my satisfaction. . I give my informed consent for the services to be provided through the use of telemedicine in my medical care  By participating in this telemedicine visit I agree to the above.

## 2019-03-08 ENCOUNTER — Ambulatory Visit (INDEPENDENT_AMBULATORY_CARE_PROVIDER_SITE_OTHER): Payer: Medicare Other

## 2019-03-08 ENCOUNTER — Other Ambulatory Visit: Payer: Self-pay

## 2019-03-08 DIAGNOSIS — I739 Peripheral vascular disease, unspecified: Secondary | ICD-10-CM

## 2019-03-10 ENCOUNTER — Other Ambulatory Visit: Payer: Self-pay

## 2019-03-11 ENCOUNTER — Inpatient Hospital Stay: Payer: Medicare Other | Attending: Oncology | Admitting: *Deleted

## 2019-03-11 ENCOUNTER — Other Ambulatory Visit: Payer: Self-pay

## 2019-03-11 ENCOUNTER — Ambulatory Visit
Admission: RE | Admit: 2019-03-11 | Discharge: 2019-03-11 | Disposition: A | Discharge status: Home / Self Care | Payer: Medicare Other | Source: Ambulatory Visit | Attending: Oncology | Admitting: Oncology

## 2019-03-11 DIAGNOSIS — I10 Essential (primary) hypertension: Secondary | ICD-10-CM | POA: Diagnosis not present

## 2019-03-11 DIAGNOSIS — C61 Malignant neoplasm of prostate: Secondary | ICD-10-CM | POA: Insufficient documentation

## 2019-03-11 DIAGNOSIS — C770 Secondary and unspecified malignant neoplasm of lymph nodes of head, face and neck: Secondary | ICD-10-CM | POA: Diagnosis present

## 2019-03-11 DIAGNOSIS — Z5111 Encounter for antineoplastic chemotherapy: Secondary | ICD-10-CM | POA: Diagnosis present

## 2019-03-11 DIAGNOSIS — C679 Malignant neoplasm of bladder, unspecified: Secondary | ICD-10-CM

## 2019-03-11 DIAGNOSIS — Z95828 Presence of other vascular implants and grafts: Secondary | ICD-10-CM

## 2019-03-11 DIAGNOSIS — C7951 Secondary malignant neoplasm of bone: Secondary | ICD-10-CM | POA: Diagnosis not present

## 2019-03-11 DIAGNOSIS — I2510 Atherosclerotic heart disease of native coronary artery without angina pectoris: Secondary | ICD-10-CM | POA: Diagnosis not present

## 2019-03-11 DIAGNOSIS — Z87891 Personal history of nicotine dependence: Secondary | ICD-10-CM | POA: Diagnosis not present

## 2019-03-11 LAB — CBC WITH DIFFERENTIAL/PLATELET
Abs Immature Granulocytes: 0.02 10*3/uL (ref 0.00–0.07)
Basophils Absolute: 0.1 10*3/uL (ref 0.0–0.1)
Basophils Relative: 1 %
Eosinophils Absolute: 0.2 10*3/uL (ref 0.0–0.5)
Eosinophils Relative: 3 %
HCT: 41.1 % (ref 39.0–52.0)
Hemoglobin: 13.5 g/dL (ref 13.0–17.0)
Immature Granulocytes: 0 %
Lymphocytes Relative: 28 %
Lymphs Abs: 2.4 10*3/uL (ref 0.7–4.0)
MCH: 31.3 pg (ref 26.0–34.0)
MCHC: 32.8 g/dL (ref 30.0–36.0)
MCV: 95.1 fL (ref 80.0–100.0)
Monocytes Absolute: 0.9 10*3/uL (ref 0.1–1.0)
Monocytes Relative: 10 %
Neutro Abs: 5.1 10*3/uL (ref 1.7–7.7)
Neutrophils Relative %: 58 %
Platelets: 224 10*3/uL (ref 150–400)
RBC: 4.32 MIL/uL (ref 4.22–5.81)
RDW: 13.8 % (ref 11.5–15.5)
WBC: 8.7 10*3/uL (ref 4.0–10.5)
nRBC: 0 % (ref 0.0–0.2)

## 2019-03-11 LAB — COMPREHENSIVE METABOLIC PANEL
ALT: 20 U/L (ref 0–44)
AST: 18 U/L (ref 15–41)
Albumin: 4.1 g/dL (ref 3.5–5.0)
Alkaline Phosphatase: 97 U/L (ref 38–126)
Anion gap: 9 (ref 5–15)
BUN: 13 mg/dL (ref 8–23)
CO2: 24 mmol/L (ref 22–32)
Calcium: 8.6 mg/dL — ABNORMAL LOW (ref 8.9–10.3)
Chloride: 104 mmol/L (ref 98–111)
Creatinine, Ser: 1.12 mg/dL (ref 0.61–1.24)
GFR calc Af Amer: >60 mL/min (ref >60)
GFR calc non Af Amer: >60 mL/min (ref >60)
Glucose, Bld: 91 mg/dL (ref 70–99)
Potassium: 3.7 mmol/L (ref 3.5–5.1)
Sodium: 137 mmol/L (ref 135–145)
Total Bilirubin: 0.6 mg/dL (ref 0.3–1.2)
Total Protein: 7.6 g/dL (ref 6.5–8.1)

## 2019-03-11 MED ORDER — HEPARIN SOD (PORK) LOCK FLUSH 100 UNIT/ML IV SOLN
500.0000 [IU] | Freq: Once | INTRAVENOUS | Status: AC
  Administered 2019-03-11: 500 [IU] via INTRAVENOUS

## 2019-03-11 MED ORDER — SODIUM CHLORIDE 0.9% FLUSH
10.0000 mL | Freq: Once | INTRAVENOUS | Status: AC
  Administered 2019-03-11: 10 mL via INTRAVENOUS
  Filled 2019-03-11: qty 10

## 2019-03-11 MED ORDER — IOHEXOL 300 MG/ML  SOLN
100.0000 mL | Freq: Once | INTRAMUSCULAR | Status: AC | PRN
  Administered 2019-03-11: 100 mL via INTRAVENOUS

## 2019-03-12 ENCOUNTER — Inpatient Hospital Stay (HOSPITAL_BASED_OUTPATIENT_CLINIC_OR_DEPARTMENT_OTHER): Payer: Medicare Other | Admitting: Oncology

## 2019-03-12 ENCOUNTER — Telehealth: Payer: Self-pay | Admitting: Cardiovascular Disease

## 2019-03-12 ENCOUNTER — Encounter: Payer: Self-pay | Admitting: Cardiovascular Disease

## 2019-03-12 ENCOUNTER — Encounter: Payer: Self-pay | Admitting: Oncology

## 2019-03-12 ENCOUNTER — Telehealth (INDEPENDENT_AMBULATORY_CARE_PROVIDER_SITE_OTHER): Payer: Medicare Other | Admitting: Cardiovascular Disease

## 2019-03-12 VITALS — BP 153/85 | HR 72 | Ht 72.0 in | Wt 200.0 lb

## 2019-03-12 DIAGNOSIS — C679 Malignant neoplasm of bladder, unspecified: Secondary | ICD-10-CM

## 2019-03-12 DIAGNOSIS — I10 Essential (primary) hypertension: Secondary | ICD-10-CM | POA: Diagnosis not present

## 2019-03-12 DIAGNOSIS — Z87891 Personal history of nicotine dependence: Secondary | ICD-10-CM | POA: Diagnosis not present

## 2019-03-12 DIAGNOSIS — Z7982 Long term (current) use of aspirin: Secondary | ICD-10-CM | POA: Diagnosis not present

## 2019-03-12 DIAGNOSIS — I739 Peripheral vascular disease, unspecified: Secondary | ICD-10-CM

## 2019-03-12 DIAGNOSIS — Z79899 Other long term (current) drug therapy: Secondary | ICD-10-CM

## 2019-03-12 DIAGNOSIS — I2510 Atherosclerotic heart disease of native coronary artery without angina pectoris: Secondary | ICD-10-CM

## 2019-03-12 MED ORDER — CARVEDILOL 6.25 MG PO TABS: 6.2500 mg | ORAL_TABLET | Freq: Two times a day (BID) | ORAL | 6 refills | Status: AC

## 2019-03-12 NOTE — Progress Notes (Signed)
Virtual Visit via Video Note   This visit type was conducted due to national recommendations for restrictions regarding the COVID-19 Pandemic (e.g. social distancing) in an effort to limit this patient's exposure and mitigate transmission in our community.  Due to his co-morbid illnesses, this patient is at least at moderate risk for complications without adequate follow up.  This format is felt to be most appropriate for this patient at this time.  All issues noted in this document were discussed and addressed.  A limited physical exam was performed with this format.  Please refer to the patient's chart for his consent to telehealth for [facility].   Date:  03/12/2019   ID:  David Rich, DOB December 22, 1945, MRN [id number]  Patient Location: Home Provider Location: Office  PCP:  Baxter Hire, MD  Cardiologist:  Kathlyn Sacramento, MD  Electrophysiologist:  None   Evaluation Performed:  Follow-Up Visit  Chief Complaint: No complaints today  History of Present Illness:    David Rich is a 73 y.o. male who was seen via video visit for follow-up regarding coronary artery disease. 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  He is a previous smoker and quit about 5 years ago.  Family history is negative for premature coronary artery disease. He had a small non-ST elevation myocardial infarction in December 2019.  Echo showed an EF of 50 to 55%.  Cardiac catheterization showed 95% proximal LAD stenosis, moderate 60% disease in the mid RCA and mild left circumflex disease.  I performed successful angioplasty and drug-eluting stent placement to the proximal LAD.   He has known history of hyperlipidemia with multiple reactions to statins.  He is currently on Zetia.  He has been doing well with no recent chest pain or shortness of breath.  During last visit, he complained of right leg claudication.  I referred him for vascular  studies which showed moderately reduced ABI on the right side with evidence of short occlusion of the right mid to distal SFA with three-vessel runoff below the knee.  He reports that his claudication actually has improved without intervention.  He might need chemotherapy again for his cancer.  The patient does not have symptoms concerning for COVID-19 infection (fever, chills, cough, or new shortness of breath).    Past Medical History:  Diagnosis Date  . Cancer [facility])    prostate  . Hyperlipidemia   . Hypertension   . Renal disorder    Past Surgical History:  Procedure Laterality Date  . CORONARY STENT INTERVENTION N/A 09/28/2018   Procedure: CORONARY STENT INTERVENTION;  Surgeon: [name], MD;  Location: [facility];  Service: Cardiovascular;  Laterality: N/A;  . CORONARY STENT PLACEMENT    . IR FLUORO GUIDE CV LINE RIGHT  04/03/2018  . kidney removed Left 2013  . LEFT HEART CATH AND CORONARY ANGIOGRAPHY N/A 09/28/2018   Procedure: LEFT HEART CATH AND CORONARY ANGIOGRAPHY;  Surgeon: [name], MD;  Location: [facility];  Service: Cardiovascular;  Laterality: N/A;     Current Meds  Medication Sig  . acetaminophen (TYLENOL) 325 MG tablet Take by mouth.  [name] amLODipine (NORVASC) 10 MG tablet TAKE 1 TABLET BY MOUTH EVERY DAY  . aspirin EC 81 MG tablet Take by mouth.  . carvedilol (COREG) 6.25 MG tablet Take 1 tablet (6.25 mg total) by mouth 2 (two) times daily.  . clopidogrel (PLAVIX) 75 MG tablet Take  75 mg by mouth daily.  [name] ezetimibe (ZETIA) 10 MG tablet TAKE 1 TABLET BY MOUTH DAILY  . famotidine (PEPCID) 20 MG tablet Take 20 mg by mouth 2 (two) times daily.   . Garlic 063 MG TABS Take by mouth.   . lidocaine-prilocaine (EMLA) cream Apply to affected area once  . Multiple Vitamin (MULTI-VITAMINS) TABS Take by mouth.  . nitroGLYCERIN (NITROSTAT) 0.4 MG SL tablet Place under the tongue.  . Omega-3 Fatty Acids (FISH OIL OMEGA-3) 1000 MG CAPS Take  by mouth daily.  . [DISCONTINUED] amLODipine (NORVASC) 2.5 MG tablet Take 1 tablet (2.5 mg total) by mouth daily.  . [DISCONTINUED] metoprolol tartrate (LOPRESSOR) 25 MG tablet Take 25 mg by mouth.      Allergies:   Clopidogrel; Rosuvastatin calcium; Etodolac; Hydrocodone; Other; Statins; Meloxicam; and Pravastatin sodium   Social History   Tobacco Use  . Smoking status: Former Smoker    Packs/day: 1.00    Years: 55.00    Pack years: 55.00    Types: Cigarettes    Last attempt to quit: 03/20/2012    Years since quitting: 6.9  . Smokeless tobacco: Never Used  Substance Use Topics  . Alcohol use: Yes    Comment: Social  . Drug use: No     Family Hx: The patient's family history includes Anemia in his mother; Breast cancer (age of onset: 50) in his sister; Coronary artery disease in his mother; Hypertension in his brother; Kidney failure in his mother; Subarachnoid hemorrhage in his father.  ROS:   Please see the history of present illness.     All other systems reviewed and are negative.   Prior CV studies:   The following studies were reviewed today:  Reviewed recent vascular studies with him.  Labs/Other Tests and Data Reviewed:    EKG:  No ECG reviewed.  Recent Labs: 03/11/2019: ALT 20; BUN 13; Creatinine, Ser 1.12; Hemoglobin 13.5; Platelets 224; Potassium 3.7; Sodium 137   Recent Lipid Panel No results found for: CHOL, TRIG, HDL, CHOLHDL, LDLCALC, LDLDIRECT  Wt Readings from Last 3 Encounters:  03/12/19 200 lb (90.7 kg)  12/10/18 199 lb 8 oz (90.5 kg)  09/28/18 198 lb 12.8 oz (90.2 kg)     Objective:    Vital Signs:  BP (!) 153/85   Pulse 72   Ht 6' (1.829 m)   Wt 200 lb (90.7 kg)   BMI 27.12 kg/m    VITAL SIGNS:  reviewed GEN:  no acute distress EYES:  sclerae anicteric, EOMI - Extraocular Movements Intact RESPIRATORY:  normal respiratory effort, symmetric expansion SKIN:  no rash, lesions or ulcers. MUSCULOSKELETAL:  no obvious deformities. NEURO:   alert and oriented x 3, no obvious focal deficit PSYCH:  normal affect  ASSESSMENT & PLAN:    1.  Coronary artery disease involving native coronary arteries without angina: He is overall doing well.    Continue dual antiplatelet therapy for at least 1 year.  2.  Hyperlipidemia: Allergies and intolerance to statins: Currently on Zetia 10 mg daily.  He declined PCSK9 inhibitors before.  3.  Essential hypertension: Blood pressure continues to be elevated.  Thus, I switch metoprolol to carvedilol 6.25 mg twice daily  4.    PAD with right leg claudication: He reports improvement in symptoms and thus I recommend continuing medical therapy.   COVID-19 Education: The signs and symptoms of COVID-19 were discussed with the patient and how to seek care for testing (follow up with PCP or arrange  E-visit).  The importance of social distancing was discussed today.  Time:   Today, I have spent 10 minutes with the patient with telehealth technology discussing the above problems.     Medication Adjustments/Labs and Tests Ordered: Current medicines are reviewed at length with the patient today.  Concerns regarding medicines are outlined above.   Tests Ordered: No orders of the defined types were placed in this encounter.   Medication Changes: Meds ordered this encounter  Medications  . carvedilol (COREG) 6.25 MG tablet    Sig: Take 1 tablet (6.25 mg total) by mouth 2 (two) times daily.    Dispense:  60 tablet    Refill:  6    To replace Metoprolol    Disposition:  Follow up in 4 month(s)  Signed, Kathlyn Sacramento, MD  03/12/2019 10:07 AM    [facility]

## 2019-03-12 NOTE — Progress Notes (Signed)
Patient contacted for telehealth visit. No concers voiced.

## 2019-03-12 NOTE — Telephone Encounter (Signed)
Spoke with the pt. Pt sts that during his telehealth visit today with Dr.Arida he wanted him to stop a medication and start carvedilol 6.25mg  bid. Pt sts that he does not remember the name of the medication to be d/c. Adv the pt that he is to stop metoprolol and start carvedilol. Pt sts that he does not take metoprolol and is not familiar with the medication.  I had the pt gather his medication bottles and reviewed the medications with the pt. Pt is taking all of the cardiac medication listed on his med list, he does not have a metoprolol on hand. Pt  sts that he does not remember every taking the medication.  Adv the pt that I will fwd an update to Dr.Arida to see if the instructions are still for him to start carvedilol 6.25mg  bid. Adv the pt that I will call back with Dr.Arida's response.

## 2019-03-12 NOTE — Telephone Encounter (Signed)
Yes his blood pressure has been high and we should start carvedilol regardless.

## 2019-03-12 NOTE — Patient Instructions (Signed)
Medication Instructions:  Stop taking metoprolol. Start carvedilol 6.25 mg twice daily.  If you need a refill on your cardiac medications before your next appointment, please call your pharmacy.   Lab work: None If you have labs (blood work) drawn today and your tests are completely normal, you will receive your results only by: [name] MyChart Message (if you have MyChart) OR . A paper copy in the mail If you have any lab test that is abnormal or we need to change your treatment, we will call you to review the results.  Testing/Procedures: None  Follow-Up: At [facility], you and your health needs are our priority.  As part of our continuing mission to provide you with exceptional heart care, we have created designated Provider Care Teams.  These Care Teams include your primary Cardiologist (physician) and Advanced Practice Providers (APPs -  Physician Assistants and Nurse Practitioners) who all work together to provide you with the care you need, when you need it. You will need a follow up appointment in 4 months.  Please call our office 2 months in advance to schedule this appointment.  You may see Kathlyn Sacramento, MD or one of the following Advanced Practice Providers on your designated Care Team:   Murray Hodgkins, NP Christell Faith, PA-C . Marrianne Mood, PA-C

## 2019-03-12 NOTE — Telephone Encounter (Signed)
Please call to discuss medication change with Carvedilol. Patient needs clarification.

## 2019-03-12 NOTE — Telephone Encounter (Signed)
Spoke with the pt and made him aware of Dr.Arida's recommendation. Pt sts that he has already picked up the Carvedilol and will start it. I asked the pt to monitor his BP and Hr at home and to contact the office if his BP is running low or high. Pt verbalized understanding

## 2019-03-21 NOTE — Progress Notes (Signed)
[place]  [phone] [phone]  ID: David Rich OB: [birth year]  MR#: [id number]  UYQ#:[id number]  Patient Care Team: Baxter Hire, MD as PCP - General (Internal Medicine) [name], MD as PCP - Cardiology (Cardiology) [name], MD as Consulting Physician (Oncology)  CHIEF COMPLAINT: Recurrent stage IVa urothelial carcinoma with left supraclavicular lymph node metastasis.  INTERVAL HISTORY: Patient returns to clinic today for further evaluation and initiation of cycle 1, day 1 of cisplatin and gemcitabine.  He continues to feel well and remains asymptomatic. He has no neurologic complaints.  He denies any recent fevers or illnesses.  He has a fair appetite and denies weight loss.  He denies any chest pain, shortness of breath, cough, or hemoptysis.  He denies any nausea, vomiting, constipation, or diarrhea.  He has no urinary complaints.  Patient offers no specific complaints today.  REVIEW OF SYSTEMS:   Review of Systems  Constitutional: Negative.  Negative for fever, malaise/fatigue and weight loss.  Respiratory: Negative.  Negative for cough and shortness of breath.   Cardiovascular: Negative.  Negative for chest pain and leg swelling.  Gastrointestinal: Negative.  Negative for abdominal pain, constipation, diarrhea, nausea and vomiting.  Genitourinary: Negative.  Negative for dysuria and hematuria.  Musculoskeletal: Positive for back pain.  Skin: Negative.  Negative for rash.  Neurological: Negative.  Negative for sensory change, focal weakness, weakness and headaches.  Psychiatric/Behavioral: Negative.  The patient is not nervous/anxious.     As per HPI. Otherwise, a complete review of systems is negative.  PAST MEDICAL HISTORY: Past Medical History:  Diagnosis Date   Cancer [facility])    prostate   Hyperlipidemia    Hypertension    Renal disorder     PAST SURGICAL HISTORY: Past Surgical History:    Procedure Laterality Date   CORONARY STENT INTERVENTION N/A 09/28/2018   Procedure: CORONARY STENT INTERVENTION;  Surgeon: [name], MD;  Location: [facility];  Service: Cardiovascular;  Laterality: N/A;   CORONARY STENT PLACEMENT     IR FLUORO GUIDE CV LINE RIGHT  04/03/2018   kidney removed Left 2013   LEFT HEART CATH AND CORONARY ANGIOGRAPHY N/A 09/28/2018   Procedure: LEFT HEART CATH AND CORONARY ANGIOGRAPHY;  Surgeon: [name], MD;  Location: [facility];  Service: Cardiovascular;  Laterality: N/A;    FAMILY HISTORY: Family History  Problem Relation Age of Onset   Coronary artery disease Mother    Anemia Mother    Kidney failure Mother    Subarachnoid hemorrhage Father    Breast cancer Sister 56   Hypertension Brother     ADVANCED DIRECTIVES (Y/N):  N  HEALTH MAINTENANCE: Social History   Tobacco Use   Smoking status: Former Smoker    Packs/day: 1.00    Years: 55.00    Pack years: 55.00    Types: Cigarettes    Quit date: 03/20/2012    Years since quitting: 7.0   Smokeless tobacco: Never Used  Substance Use Topics   Alcohol use: Yes    Comment: Social   Drug use: No     Colonoscopy:  PAP:  Bone density:  Lipid panel:  Allergies  Allergen Reactions   Clopidogrel Rash and Hives   Rosuvastatin Calcium Anaphylaxis   Etodolac Nausea And Vomiting and Other (See Comments)    Bad dreams Bad dreams    Hydrocodone Other (See Comments)    Bad dreams Bad dreams    Other  Statins    Meloxicam Rash    Bad dreams Bad dreams    Pravastatin Sodium Rash    Current Outpatient Medications  Medication Sig Dispense Refill   acetaminophen (TYLENOL) 325 MG tablet Take 325 mg by mouth every 4 (four) hours as needed.      amLODipine (NORVASC) 10 MG tablet TAKE 1 TABLET BY MOUTH EVERY DAY     aspirin EC 81 MG tablet Take by mouth.     carvedilol (COREG) 6.25 MG tablet Take 1 tablet (6.25 mg total) by  mouth 2 (two) times daily. 60 tablet 6   clopidogrel (PLAVIX) 75 MG tablet Take 75 mg by mouth daily.     ezetimibe (ZETIA) 10 MG tablet TAKE 1 TABLET BY MOUTH DAILY 90 tablet 0   famotidine (PEPCID) 20 MG tablet Take 20 mg by mouth 2 (two) times daily.      Garlic 270 MG TABS Take by mouth.      HYDROcodone-acetaminophen (NORCO/VICODIN) 5-325 MG tablet Take 1 tablet by mouth every 6 (six) hours as needed.     lidocaine-prilocaine (EMLA) cream Apply to affected area once 30 g 3   Multiple Vitamin (MULTI-VITAMINS) TABS Take by mouth.     nitroGLYCERIN (NITROSTAT) 0.4 MG SL tablet Place under the tongue.     Omega-3 Fatty Acids (FISH OIL OMEGA-3) 1000 MG CAPS Take by mouth daily.     No current facility-administered medications for this visit.    Facility-Administered Medications Ordered in Other Visits  Medication Dose Route Frequency Provider Last Rate Last Dose   sodium chloride flush (NS) 0.9 % injection 10 mL  10 mL Intravenous PRN [name], MD   10 mL at 10/05/18 1026    OBJECTIVE: Vitals:   03/25/19 0843  BP: (!) 156/75  Pulse: (!) 57  Temp: (!) 97.1 F (36.2 C)     Body mass index is 26.45 kg/m.    ECOG FS:0 - Asymptomatic   General: Well-developed, well-nourished, no acute distress. Eyes: Pink conjunctiva, anicteric sclera. HEENT: Normocephalic, moist mucous membranes. Lungs: Clear to auscultation bilaterally. Heart: Regular rate and rhythm. No rubs, murmurs, or gallops. Abdomen: Soft, nontender, nondistended. No organomegaly noted, normoactive bowel sounds. Musculoskeletal: No edema, cyanosis, or clubbing. Neuro: Alert, answering all questions appropriately. Cranial nerves grossly intact. Skin: No rashes or petechiae noted. Psych: Normal affect.  LAB RESULTS:  Lab Results  Component Value Date   NA 138 03/25/2019   K 3.9 03/25/2019   CL 106 03/25/2019   CO2 22 03/25/2019   GLUCOSE 96 03/25/2019   BUN 17 03/25/2019   CREATININE 1.04  03/25/2019   CALCIUM 8.6 (L) 03/25/2019   PROT 7.3 03/25/2019   ALBUMIN 3.9 03/25/2019   AST 20 03/25/2019   ALT 22 03/25/2019   ALKPHOS 92 03/25/2019   BILITOT 0.4 03/25/2019   GFRNONAA >60 03/25/2019   GFRAA >60 03/25/2019    Lab Results  Component Value Date   WBC 7.4 03/25/2019   NEUTROABS 4.5 03/25/2019   HGB 12.9 (L) 03/25/2019   HCT 39.2 03/25/2019   MCV 94.7 03/25/2019   PLT 231 03/25/2019     STUDIES: Ct Chest W Contrast  Result Date: 03/11/2019 CLINICAL DATA:  Restaging uroepithelial cancer status post nephrectomy EXAM: CT CHEST, ABDOMEN, AND PELVIS WITH CONTRAST TECHNIQUE: Multidetector CT imaging of the chest, abdomen and pelvis was performed following the standard protocol during bolus administration of intravenous contrast. CONTRAST:  135mL OMNIPAQUE IOHEXOL 300 MG/ML  SOLN COMPARISON:  CT scan  01/05/2019 FINDINGS: CT CHEST FINDINGS Cardiovascular: The heart is normal in size. No pericardial effusion. Stable mild tortuosity and calcification of the thoracic aorta but no aneurysm or dissection. The branch vessels are patent. Mediastinum/Nodes: Stable small scattered mediastinal and hilar lymph nodes but no mass or overt adenopathy. The esophagus is grossly normal. Lungs/Pleura: Stable emphysematous changes and areas of pulmonary scarring. Again noted are numerous clustered pulmonary nodules in both lungs consistent with metastatic disease. Most of these nodules are relatively stable but some have enlarged and there are new nodules. 8 mm right middle lobe nodule on image number 87 previously measured 6.5 mm. Clustered irregular nodular density in the right lower lobe on image number 111 measures 18.5 mm and previously measured 14.5 mm. Bilobed nodular density in the left lower lobe on image number 137 measures 17 mm and is new. 5.5 mm right lower lobe nodule on image number 132 is new. Musculoskeletal: No chest wall mass is identified. Left supraclavicular lymph nodes are again  demonstrated. The larger more anterior node on image number 5 measures 10.5 mm and is unchanged. Slightly more inferiorly there is an 8.5 mm nodule which previously measured 8 mm. No axillary adenopathy. Stable right-sided Port-A-Cath. There is a new central depression type fracture or large Smalls node involving L2. Could not exclude the possibility of a pathologic fracture. It appeared completely normal on the prior CT scan from March. MRI lumbar spine without and with contrast may be helpful for further evaluation CT ABDOMEN PELVIS FINDINGS Hepatobiliary: No focal hepatic lesions or intrahepatic biliary dilatation. Gallbladder appears normal. No common bile duct dilatation. Pancreas: No mass, inflammation or ductal dilatation. Spleen: Normal size.  No focal lesions. Adrenals/Urinary Tract: The adrenal glands are normal in stable. Status post right nephro ureterectomy. No findings for residual tumor in the renal fossa. The left kidney is unremarkable. No worrisome renal lesions or collecting system abnormality on the delayed images. Stomach/Bowel: The stomach, duodenum, small bowel and colon are grossly normal. No inflammatory changes, mass lesions or obstructive findings. Advanced sigmoid colon diverticulosis but no findings for acute diverticulitis. The terminal ileum and appendix are normal. Vascular/Lymphatic: Stable appearing retroperitoneal lymphadenopathy. The dominant left para-aortic node on image number 77 measures 17 mm and previously measured 18 mm. More inferiorly near the iliac artery bifurcation an 11 mm left-sided retroperitoneal node on image number 90 previously measured 12 mm. Right-sided pelvic adenopathy appears stable. 10 mm external iliac lymph node on image number 105 is unchanged. 13 mm right external iliac lymph node on image number 110 previously measured 11.5 mm. Soft tissue lesion in the region of the resected right ureter distally on image number 114 measures 13 x 9.5 mm and  previously measured 11.5 x 10 mm. Adjacent 8 mm node on image number 113 is stable. Reproductive: The prostate gland and seminal vesicles are unremarkable. Penile prosthesis is noted. Other: Small inguinal hernias containing fat. No inguinal adenopathy. Musculoskeletal: No worrisome lytic or sclerotic bone lesions involving the pelvis. IMPRESSION: 1. Slight interval progression of pulmonary metastatic disease with some enlarging and some new pulmonary nodules. 2. Stable left supraclavicular lymph nodes. 3. Overall stable retroperitoneal and pelvic lymphadenopathy. 4. New compression fracture of L2. Suspicious for a pathologic fracture. MRI lumbar spine without and with contrast may be helpful for further evaluation. No spinal canal compromise. Electronically Signed   By: Marijo Sanes M.D.   On: 03/11/2019 17:03   Ct Abdomen Pelvis W Contrast  Result Date: 03/11/2019 CLINICAL DATA:  Restaging  uroepithelial cancer status post nephrectomy EXAM: CT CHEST, ABDOMEN, AND PELVIS WITH CONTRAST TECHNIQUE: Multidetector CT imaging of the chest, abdomen and pelvis was performed following the standard protocol during bolus administration of intravenous contrast. CONTRAST:  153mL OMNIPAQUE IOHEXOL 300 MG/ML  SOLN COMPARISON:  CT scan 01/05/2019 FINDINGS: CT CHEST FINDINGS Cardiovascular: The heart is normal in size. No pericardial effusion. Stable mild tortuosity and calcification of the thoracic aorta but no aneurysm or dissection. The branch vessels are patent. Mediastinum/Nodes: Stable small scattered mediastinal and hilar lymph nodes but no mass or overt adenopathy. The esophagus is grossly normal. Lungs/Pleura: Stable emphysematous changes and areas of pulmonary scarring. Again noted are numerous clustered pulmonary nodules in both lungs consistent with metastatic disease. Most of these nodules are relatively stable but some have enlarged and there are new nodules. 8 mm right middle lobe nodule on image number 87  previously measured 6.5 mm. Clustered irregular nodular density in the right lower lobe on image number 111 measures 18.5 mm and previously measured 14.5 mm. Bilobed nodular density in the left lower lobe on image number 137 measures 17 mm and is new. 5.5 mm right lower lobe nodule on image number 132 is new. Musculoskeletal: No chest wall mass is identified. Left supraclavicular lymph nodes are again demonstrated. The larger more anterior node on image number 5 measures 10.5 mm and is unchanged. Slightly more inferiorly there is an 8.5 mm nodule which previously measured 8 mm. No axillary adenopathy. Stable right-sided Port-A-Cath. There is a new central depression type fracture or large Smalls node involving L2. Could not exclude the possibility of a pathologic fracture. It appeared completely normal on the prior CT scan from March. MRI lumbar spine without and with contrast may be helpful for further evaluation CT ABDOMEN PELVIS FINDINGS Hepatobiliary: No focal hepatic lesions or intrahepatic biliary dilatation. Gallbladder appears normal. No common bile duct dilatation. Pancreas: No mass, inflammation or ductal dilatation. Spleen: Normal size.  No focal lesions. Adrenals/Urinary Tract: The adrenal glands are normal in stable. Status post right nephro ureterectomy. No findings for residual tumor in the renal fossa. The left kidney is unremarkable. No worrisome renal lesions or collecting system abnormality on the delayed images. Stomach/Bowel: The stomach, duodenum, small bowel and colon are grossly normal. No inflammatory changes, mass lesions or obstructive findings. Advanced sigmoid colon diverticulosis but no findings for acute diverticulitis. The terminal ileum and appendix are normal. Vascular/Lymphatic: Stable appearing retroperitoneal lymphadenopathy. The dominant left para-aortic node on image number 77 measures 17 mm and previously measured 18 mm. More inferiorly near the iliac artery bifurcation an 11  mm left-sided retroperitoneal node on image number 90 previously measured 12 mm. Right-sided pelvic adenopathy appears stable. 10 mm external iliac lymph node on image number 105 is unchanged. 13 mm right external iliac lymph node on image number 110 previously measured 11.5 mm. Soft tissue lesion in the region of the resected right ureter distally on image number 114 measures 13 x 9.5 mm and previously measured 11.5 x 10 mm. Adjacent 8 mm node on image number 113 is stable. Reproductive: The prostate gland and seminal vesicles are unremarkable. Penile prosthesis is noted. Other: Small inguinal hernias containing fat. No inguinal adenopathy. Musculoskeletal: No worrisome lytic or sclerotic bone lesions involving the pelvis. IMPRESSION: 1. Slight interval progression of pulmonary metastatic disease with some enlarging and some new pulmonary nodules. 2. Stable left supraclavicular lymph nodes. 3. Overall stable retroperitoneal and pelvic lymphadenopathy. 4. New compression fracture of L2. Suspicious for a  pathologic fracture. MRI lumbar spine without and with contrast may be helpful for further evaluation. No spinal canal compromise. Electronically Signed   By: Marijo Sanes M.D.   On: 03/11/2019 17:03   Vas [place] Lower Ext Art Seg Multi (segmentals & [place])  Result Date: 03/08/2019 LOWER EXTREMITY DOPPLER STUDY Indications: Patient states that he has pain and cramping in his right leg when              walking varying distances. High Risk Factors: Hypertension, hyperlipidemia, past history of smoking,                    coronary artery disease.  Performing Technologist: Wilkie Aye RVT  Examination Guidelines: A complete evaluation includes at minimum, Doppler waveform signals and systolic blood pressure reading at the level of bilateral brachial, anterior tibial, and posterior tibial arteries, when vessel segments are accessible. Bilateral testing is considered an integral part of a complete examination.  Photoelectric Plethysmograph (PPG) waveforms and toe systolic pressure readings are included as required and additional duplex testing as needed. Limited examinations for reoccurring indications may be performed as noted.  ABI Findings: +---------+------------------+-----+-------------------+--------+  Right     Rt Pressure (mmHg) Index Waveform            Comment   +---------+------------------+-----+-------------------+--------+  Brachial  133                                                    +---------+------------------+-----+-------------------+--------+  CFA                                triphasic                     +---------+------------------+-----+-------------------+--------+  Popliteal                          dampened monophasic           +---------+------------------+-----+-------------------+--------+  ATA       81                 0.61  monophasic                    +---------+------------------+-----+-------------------+--------+  PTA       92                 0.69  dampened monophasic           +---------+------------------+-----+-------------------+--------+  PERO      77                 0.58  monophasic                    +---------+------------------+-----+-------------------+--------+  Great Toe 81                 0.61  Abnormal                      +---------+------------------+-----+-------------------+--------+ +---------+------------------+-----+---------+-------+  Left      Lt Pressure (mmHg) Index Waveform  Comment  +---------+------------------+-----+---------+-------+  Brachial  127                                         +---------+------------------+-----+---------+-------+  CFA                                triphasic          +---------+------------------+-----+---------+-------+  Popliteal                          triphasic          +---------+------------------+-----+---------+-------+  ATA       122                0.92  biphasic            +---------+------------------+-----+---------+-------+  PTA       134                1.01  biphasic           +---------+------------------+-----+---------+-------+  PERO      159                1.20  biphasic           +---------+------------------+-----+---------+-------+  Great Toe 106                0.80  Normal             +---------+------------------+-----+---------+-------+ +-------+-----------+-----------+------------+------------+  ABI/TBI Today's ABI Today's TBI Previous ABI Previous TBI  +-------+-----------+-----------+------------+------------+  Right   0.69        0.61                                   +-------+-----------+-----------+------------+------------+  Left    1.20        0.80                                   +-------+-----------+-----------+------------+------------+  Summary: Right: Resting right ankle-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 The right toe-brachial index is abnormal. Left: Resting left ankle-brachial index is within normal range. No evidence of significant left lower extremity arterial disease. The left toe-brachial index is normal.  *See table(s) above for measurements and observations.  Vascular consult recommended. Electronically signed by Quay Burow MD on 03/08/2019 at 1:41:41 PM.    Final    Vas [place] Lower Extremity Arterial Duplex  Result Date: 03/08/2019 LOWER EXTREMITY ARTERIAL DUPLEX STUDY Indications: Patient states that he has pain and cramping in his right leg when              walking varying distances. High Risk Factors: Hypertension, hyperlipidemia, past history of smoking,                    coronary artery disease.  Current ABI: Today the right ABI was 0.69 and left 1.20 Performing Technologist: Wilkie Aye RVT  Examination Guidelines: A complete evaluation includes B-mode imaging, spectral Doppler, color Doppler, and power Doppler as needed of all accessible portions of each vessel. Bilateral testing is considered an  integral part of a complete examination. Limited examinations for reoccurring indications may be performed as noted.  +-----------+--------+-----+--------+----------+--------+  RIGHT       PSV cm/s Ratio Stenosis Waveform   Comments  +-----------+--------+-----+--------+----------+--------+  CFA Prox    151  triphasic            +-----------+--------+-----+--------+----------+--------+  CFA Distal  73                      triphasic            +-----------+--------+-----+--------+----------+--------+  DFA         163                     biphasic             +-----------+--------+-----+--------+----------+--------+  SFA Prox    43                      biphasic             +-----------+--------+-----+--------+----------+--------+  SFA Mid     91                      biphasic             +-----------+--------+-----+--------+----------+--------+  SFA Distal                 occluded                      +-----------+--------+-----+--------+----------+--------+  POP Prox    18                      monophasic           +-----------+--------+-----+--------+----------+--------+  POP Distal  17                      monophasic           +-----------+--------+-----+--------+----------+--------+  TP Trunk    26                      monophasic           +-----------+--------+-----+--------+----------+--------+  ATA Prox    34                      monophasic           +-----------+--------+-----+--------+----------+--------+  PTA Distal  20                      monophasic           +-----------+--------+-----+--------+----------+--------+  PERO Distal 16                      monophasic           +-----------+--------+-----+--------+----------+--------+  +-----------+--------+-----+--------+--------+--------+  LEFT        PSV cm/s Ratio Stenosis Waveform Comments  +-----------+--------+-----+--------+--------+--------+  CFA Prox    145                     biphasic            +-----------+--------+-----+--------+--------+--------+  CFA Distal  82                      biphasic           +-----------+--------+-----+--------+--------+--------+  DFA         75                      biphasic           +-----------+--------+-----+--------+--------+--------+  SFA  Prox    77                      biphasic           +-----------+--------+-----+--------+--------+--------+  SFA Mid     102                     biphasic           +-----------+--------+-----+--------+--------+--------+  SFA Distal  109                     biphasic           +-----------+--------+-----+--------+--------+--------+  POP Prox    91                      biphasic           +-----------+--------+-----+--------+--------+--------+  POP Distal  62                      biphasic           +-----------+--------+-----+--------+--------+--------+  TP Trunk    70                      biphasic           +-----------+--------+-----+--------+--------+--------+  ATA Distal  94                      biphasic           +-----------+--------+-----+--------+--------+--------+  PTA Prox    79                      biphasic           +-----------+--------+-----+--------+--------+--------+  PERO Distal 40                      biphasic           +-----------+--------+-----+--------+--------+--------+  Summary: Right: Atherosclerosis in the common femoral, femoral, popliteal and tibial arteries. Short segment occlusion in the mid/distal SFA with reconstitution at the above knee popliteal. Three vessel run off. Left: Atherosclerosis in the common femoral, femoral, popliteal and tibial arteries with no focal stenosis. Three vessel run off.  See table(s) above for measurements and observations. Vascular consult recommended. Electronically signed by Quay Burow MD on 03/08/2019 at 1:38:25 PM.    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  PDL 1 0%  PLAN:    1.  Recurrent stage  IVa urothelial carcinoma with left supraclavicular lymph node metastasis: Previously, left supraclavicular lymph node biopsy confirmed metastatic disease.  Patient received his last dose of cisplatin and gemcitabine on May 27, 2018.  Because patient had previous nephrectomy, he was given a split dose of cisplatin.  CT scan results from March 11, 2019 reviewed independently with continued increase in size and number of pulmonary nodules.  This is consistent with progressive metastatic disease.  Patient wished to proceed with palliative chemotherapy and will receive cisplatin and gemcitabine on days 1 and 8 with a 15 off.  Proceed with cycle 1, day 1 today.  Return to clinic in 1 week for further evaluation and consideration of cycle 1, day 8. 2.  Genetic testing: Negative. 3.  Kidney function: Patient's creatinine continues to be within normal limits. 4.  Cardiac disease: Continue follow-up with  cardiology as indicated. 5.  Back pain: Patient has an MRI scheduled later this week.  Okay to proceed with cortisone injection if necessary provided patient's platelet counts are adequate.  I spent a total of 30 minutes face-to-face with the patient of which greater than 50% of the visit was spent in counseling and coordination of care as detailed above.  Patient expressed understanding and was in agreement with this plan. He also understands that He can call clinic at any time with any questions, concerns, or complaints.   Cancer Staging Urothelial carcinoma of bladder [facility]) Staging form: Urinary Bladder, AJCC 8th Edition - Clinical: Stage IVA [place], cN2, cM1a) - Signed by [name], MD on 04/09/2018   [name], MD   03/26/2019 7:10 AM

## 2019-03-24 ENCOUNTER — Other Ambulatory Visit: Payer: Self-pay | Admitting: Internal Medicine

## 2019-03-24 ENCOUNTER — Other Ambulatory Visit: Payer: Self-pay | Admitting: Cardiovascular Disease

## 2019-03-24 DIAGNOSIS — M4850XA Collapsed vertebra, not elsewhere classified, site unspecified, initial encounter for fracture: Secondary | ICD-10-CM

## 2019-03-25 ENCOUNTER — Other Ambulatory Visit: Payer: Self-pay

## 2019-03-25 ENCOUNTER — Inpatient Hospital Stay: Payer: Medicare Other

## 2019-03-25 ENCOUNTER — Inpatient Hospital Stay: Payer: Medicare Other | Admitting: *Deleted

## 2019-03-25 ENCOUNTER — Inpatient Hospital Stay (HOSPITAL_BASED_OUTPATIENT_CLINIC_OR_DEPARTMENT_OTHER): Payer: Medicare Other | Admitting: Oncology

## 2019-03-25 VITALS — BP 156/75 | HR 57 | Temp 97.1°F | Ht 72.0 in | Wt 195.0 lb

## 2019-03-25 DIAGNOSIS — Z905 Acquired absence of kidney: Secondary | ICD-10-CM | POA: Insufficient documentation

## 2019-03-25 DIAGNOSIS — C679 Malignant neoplasm of bladder, unspecified: Secondary | ICD-10-CM

## 2019-03-25 DIAGNOSIS — C770 Secondary and unspecified malignant neoplasm of lymph nodes of head, face and neck: Secondary | ICD-10-CM

## 2019-03-25 DIAGNOSIS — I519 Heart disease, unspecified: Secondary | ICD-10-CM

## 2019-03-25 DIAGNOSIS — R918 Other nonspecific abnormal finding of lung field: Secondary | ICD-10-CM

## 2019-03-25 DIAGNOSIS — Z5111 Encounter for antineoplastic chemotherapy: Secondary | ICD-10-CM | POA: Diagnosis not present

## 2019-03-25 DIAGNOSIS — Z95828 Presence of other vascular implants and grafts: Secondary | ICD-10-CM

## 2019-03-25 DIAGNOSIS — M549 Dorsalgia, unspecified: Secondary | ICD-10-CM

## 2019-03-25 LAB — CBC WITH DIFFERENTIAL/PLATELET
Abs Immature Granulocytes: 0.02 10*3/uL (ref 0.00–0.07)
Basophils Absolute: 0.1 10*3/uL (ref 0.0–0.1)
Basophils Relative: 1 %
Eosinophils Absolute: 0.3 10*3/uL (ref 0.0–0.5)
Eosinophils Relative: 4 %
HCT: 39.2 % (ref 39.0–52.0)
Hemoglobin: 12.9 g/dL — ABNORMAL LOW (ref 13.0–17.0)
Immature Granulocytes: 0 %
Lymphocytes Relative: 24 %
Lymphs Abs: 1.7 10*3/uL (ref 0.7–4.0)
MCH: 31.2 pg (ref 26.0–34.0)
MCHC: 32.9 g/dL (ref 30.0–36.0)
MCV: 94.7 fL (ref 80.0–100.0)
Monocytes Absolute: 0.9 10*3/uL (ref 0.1–1.0)
Monocytes Relative: 12 %
Neutro Abs: 4.5 10*3/uL (ref 1.7–7.7)
Neutrophils Relative %: 59 %
Platelets: 231 10*3/uL (ref 150–400)
RBC: 4.14 MIL/uL — ABNORMAL LOW (ref 4.22–5.81)
RDW: 14.3 % (ref 11.5–15.5)
WBC: 7.4 10*3/uL (ref 4.0–10.5)
nRBC: 0 % (ref 0.0–0.2)

## 2019-03-25 LAB — COMPREHENSIVE METABOLIC PANEL
ALT: 22 U/L (ref 0–44)
AST: 20 U/L (ref 15–41)
Albumin: 3.9 g/dL (ref 3.5–5.0)
Alkaline Phosphatase: 92 U/L (ref 38–126)
Anion gap: 10 (ref 5–15)
BUN: 17 mg/dL (ref 8–23)
CO2: 22 mmol/L (ref 22–32)
Calcium: 8.6 mg/dL — ABNORMAL LOW (ref 8.9–10.3)
Chloride: 106 mmol/L (ref 98–111)
Creatinine, Ser: 1.04 mg/dL (ref 0.61–1.24)
GFR calc Af Amer: >60 mL/min (ref >60)
GFR calc non Af Amer: >60 mL/min (ref >60)
Glucose, Bld: 96 mg/dL (ref 70–99)
Potassium: 3.9 mmol/L (ref 3.5–5.1)
Sodium: 138 mmol/L (ref 135–145)
Total Bilirubin: 0.4 mg/dL (ref 0.3–1.2)
Total Protein: 7.3 g/dL (ref 6.5–8.1)

## 2019-03-25 MED ORDER — PALONOSETRON HCL INJECTION 0.25 MG/5ML
0.2500 mg | Freq: Once | INTRAVENOUS | Status: AC
  Administered 2019-03-25: 0.25 mg via INTRAVENOUS
  Filled 2019-03-25: qty 5

## 2019-03-25 MED ORDER — HEPARIN SOD (PORK) LOCK FLUSH 100 UNIT/ML IV SOLN
500.0000 [IU] | Freq: Once | INTRAVENOUS | Status: AC | PRN
  Administered 2019-03-25: 500 [IU]
  Filled 2019-03-25: qty 5

## 2019-03-25 MED ORDER — SODIUM CHLORIDE 0.9% FLUSH
10.0000 mL | Freq: Once | INTRAVENOUS | Status: AC
  Administered 2019-03-25: 10 mL via INTRAVENOUS
  Filled 2019-03-25: qty 10

## 2019-03-25 MED ORDER — SODIUM CHLORIDE 0.9 % IV SOLN
Freq: Once | INTRAVENOUS | Status: AC
  Administered 2019-03-25: 10:00:00 via INTRAVENOUS
  Filled 2019-03-25: qty 250

## 2019-03-25 MED ORDER — POTASSIUM CHLORIDE 2 MEQ/ML IV SOLN
Freq: Once | INTRAVENOUS | Status: AC
  Administered 2019-03-25: 10:00:00 via INTRAVENOUS
  Filled 2019-03-25: qty 1000

## 2019-03-25 MED ORDER — SODIUM CHLORIDE 0.9 % IV SOLN
Freq: Once | INTRAVENOUS | Status: AC
  Administered 2019-03-25: 12:00:00 via INTRAVENOUS
  Filled 2019-03-25: qty 5

## 2019-03-25 MED ORDER — SODIUM CHLORIDE 0.9 % IV SOLN
1600.0000 mg | Freq: Once | INTRAVENOUS | Status: AC
  Administered 2019-03-25: 1600 mg via INTRAVENOUS
  Filled 2019-03-25: qty 26.3

## 2019-03-25 MED ORDER — SODIUM CHLORIDE 0.9 % IV SOLN
35.0000 mg/m2 | Freq: Once | INTRAVENOUS | Status: AC
  Administered 2019-03-25: 74 mg via INTRAVENOUS
  Filled 2019-03-25: qty 74

## 2019-03-25 NOTE — Progress Notes (Signed)
Patient stated that he continues to have back pain.Patient will have a MRI of his lumbar. Patient denied fever, chill, nausea, vomiting, diarrhea, constipation and poor appetite.

## 2019-03-25 NOTE — Progress Notes (Signed)
Pt tolerated infusion well. Pt stable at discharge. 

## 2019-03-27 ENCOUNTER — Ambulatory Visit
Admission: RE | Admit: 2019-03-27 | Discharge: 2019-03-27 | Disposition: A | Discharge status: Home / Self Care | Payer: Medicare Other | Source: Ambulatory Visit | Attending: Internal Medicine | Admitting: Internal Medicine

## 2019-03-27 ENCOUNTER — Other Ambulatory Visit: Payer: Self-pay

## 2019-03-27 DIAGNOSIS — M4850XA Collapsed vertebra, not elsewhere classified, site unspecified, initial encounter for fracture: Secondary | ICD-10-CM | POA: Diagnosis not present

## 2019-03-27 NOTE — Progress Notes (Signed)
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  INTERVAL HISTORY: Patient returns to clinic today for further evaluation and consideration of cycle 1, day 8 of cisplatin and gemcitabine.  He recently had an MRI of his back revealing metastatic disease.  He has significant pain today as well as worsening constipation. He has no neurologic complaints.  He denies any recent fevers or illnesses.  He has a fair appetite and denies weight loss.  He denies any chest pain, shortness of breath, cough, or hemoptysis.  He denies any nausea, vomiting, or diarrhea.  He has no urinary complaints.  Patient feels generally terrible, but offers no further specific complaints today.  REVIEW OF SYSTEMS:   Review of Systems  Constitutional: Negative.  Negative for fever, malaise/fatigue and weight loss.  Respiratory: Negative.  Negative for cough and shortness of breath.   Cardiovascular: Negative.  Negative for chest pain and leg swelling.  Gastrointestinal: Positive for constipation. Negative for abdominal pain, diarrhea, nausea and vomiting.  Genitourinary: Negative.  Negative for dysuria and hematuria.  Musculoskeletal: Positive for back pain.  Skin: Negative.  Negative for rash.  Neurological: Negative.  Negative for sensory change, focal weakness, weakness and headaches.  Psychiatric/Behavioral: Negative.  The patient is not nervous/anxious.     As per HPI. Otherwise, a complete review of systems is negative.  PAST MEDICAL HISTORY: Past Medical History:  Diagnosis Date   Cancer [facility])    prostate    Hyperlipidemia    Hypertension    Renal disorder     PAST SURGICAL HISTORY: Past Surgical History:  Procedure Laterality Date   CORONARY STENT INTERVENTION N/A 09/28/2018   Procedure: CORONARY STENT INTERVENTION;  Surgeon: [name], MD;  Location: [facility];  Service: Cardiovascular;  Laterality: N/A;   CORONARY STENT PLACEMENT     IR FLUORO GUIDE CV LINE RIGHT  04/03/2018   kidney removed Left 2013   LEFT HEART CATH AND CORONARY ANGIOGRAPHY N/A 09/28/2018   Procedure: LEFT HEART CATH AND CORONARY ANGIOGRAPHY;  Surgeon: [name], MD;  Location: [facility];  Service: Cardiovascular;  Laterality: N/A;    FAMILY HISTORY: Family History  Problem Relation Age of Onset   Coronary artery disease Mother    Anemia Mother    Kidney failure Mother    Subarachnoid hemorrhage Father    Breast cancer Sister 40   Hypertension Brother     ADVANCED DIRECTIVES (Y/N):  N  HEALTH MAINTENANCE: Social History   Tobacco Use   Smoking status: Former Smoker    Packs/day: 1.00    Years: 55.00    Pack years: 55.00    Types: Cigarettes    Quit date: 03/20/2012    Years since quitting: 7.0   Smokeless tobacco: Never Used  Substance Use Topics   Alcohol use: Yes    Comment: Social   Drug use: No     Colonoscopy:  PAP:  Bone density:  Lipid panel:  Allergies  Allergen Reactions   Clopidogrel Rash and Hives   Rosuvastatin Calcium Anaphylaxis   Etodolac Nausea And Vomiting and Other (See Comments)    Bad dreams Bad dreams  Hydrocodone Other (See Comments)    Bad dreams Bad dreams    Other    Statins    Meloxicam Rash    Bad dreams Bad dreams    Pravastatin Sodium Rash    Current Outpatient Medications  Medication Sig Dispense Refill   acetaminophen (TYLENOL) 325 MG tablet Take 325 mg by mouth every 4 (four) hours as needed.      amLODipine (NORVASC) 10 MG tablet TAKE 1 TABLET BY MOUTH EVERY DAY     aspirin  EC 81 MG tablet Take by mouth.     carvedilol (COREG) 6.25 MG tablet Take 1 tablet (6.25 mg total) by mouth 2 (two) times daily. 60 tablet 6   clopidogrel (PLAVIX) 75 MG tablet Take 75 mg by mouth daily.     ezetimibe (ZETIA) 10 MG tablet TAKE 1 TABLET BY MOUTH DAILY 90 tablet 0   famotidine (PEPCID) 20 MG tablet Take 20 mg by mouth 2 (two) times daily.      Garlic 865 MG TABS Take by mouth.      lidocaine-prilocaine (EMLA) cream Apply to affected area once 30 g 3   Multiple Vitamin (MULTI-VITAMINS) TABS Take by mouth.     nitroGLYCERIN (NITROSTAT) 0.4 MG SL tablet Place under the tongue.     Omega-3 Fatty Acids (FISH OIL OMEGA-3) 1000 MG CAPS Take by mouth daily.     HYDROcodone-acetaminophen (NORCO/VICODIN) 5-325 MG tablet Take 1 tablet by mouth every 6 (six) hours as needed for moderate pain. 120 tablet 0   No current facility-administered medications for this visit.    Facility-Administered Medications Ordered in Other Visits  Medication Dose Route Frequency Provider Last Rate Last Dose   sodium chloride flush (NS) 0.9 % injection 10 mL  10 mL Intravenous PRN [name], MD   10 mL at 10/05/18 1026    OBJECTIVE: Vitals:   04/01/19 0836  BP: (!) 144/84  Pulse: 66  Temp: (!) 96.9 F (36.1 C)     Body mass index is 25.94 kg/m.    ECOG FS:0 - Asymptomatic   General: Well-developed, well-nourished, moderate distress secondary to pain. Eyes: Pink conjunctiva, anicteric sclera. HEENT: Normocephalic, moist mucous membranes. Lungs: Clear to auscultation bilaterally. Heart: Regular rate and rhythm. No rubs, murmurs, or gallops. Abdomen: Soft, nontender, nondistended. No organomegaly noted, normoactive bowel sounds. Musculoskeletal: No edema, cyanosis, or clubbing. Neuro: Alert, answering all questions appropriately. Cranial nerves grossly intact. Skin: No rashes or petechiae noted. Psych: Normal affect.  LAB RESULTS:  Lab Results  Component Value Date   NA  138 04/01/2019   K 3.7 04/01/2019   CL 105 04/01/2019   CO2 25 04/01/2019   GLUCOSE 127 (H) 04/01/2019   BUN 17 04/01/2019   CREATININE 1.10 04/01/2019   CALCIUM 8.8 (L) 04/01/2019   PROT 6.9 04/01/2019   ALBUMIN 3.8 04/01/2019   AST 18 04/01/2019   ALT 31 04/01/2019   ALKPHOS 92 04/01/2019   BILITOT 0.5 04/01/2019   GFRNONAA >60 04/01/2019   GFRAA >60 04/01/2019    Lab Results  Component Value Date   WBC 3.6 (L) 04/01/2019   NEUTROABS 2.2 04/01/2019   HGB 12.0 (L) 04/01/2019   HCT 35.9 (L) 04/01/2019   MCV 94.2 04/01/2019   PLT 148 (L) 04/01/2019     STUDIES: Ct Chest W Contrast  Result Date: 03/11/2019 CLINICAL DATA:  Restaging uroepithelial cancer status post nephrectomy EXAM: CT CHEST, ABDOMEN, AND PELVIS WITH CONTRAST TECHNIQUE: Multidetector CT imaging of the chest, abdomen  and pelvis was performed following the standard protocol during bolus administration of intravenous contrast. CONTRAST:  129mL OMNIPAQUE IOHEXOL 300 MG/ML  SOLN COMPARISON:  CT scan 01/05/2019 FINDINGS: CT CHEST FINDINGS Cardiovascular: The heart is normal in size. No pericardial effusion. Stable mild tortuosity and calcification of the thoracic aorta but no aneurysm or dissection. The branch vessels are patent. Mediastinum/Nodes: Stable small scattered mediastinal and hilar lymph nodes but no mass or overt adenopathy. The esophagus is grossly normal. Lungs/Pleura: Stable emphysematous changes and areas of pulmonary scarring. Again noted are numerous clustered pulmonary nodules in both lungs consistent with metastatic disease. Most of these nodules are relatively stable but some have enlarged and there are new nodules. 8 mm right middle lobe nodule on image number 87 previously measured 6.5 mm. Clustered irregular nodular density in the right lower lobe on image number 111 measures 18.5 mm and previously measured 14.5 mm. Bilobed nodular density in the left lower lobe on image number 137 measures 17 mm and is  new. 5.5 mm right lower lobe nodule on image number 132 is new. Musculoskeletal: No chest wall mass is identified. Left supraclavicular lymph nodes are again demonstrated. The larger more anterior node on image number 5 measures 10.5 mm and is unchanged. Slightly more inferiorly there is an 8.5 mm nodule which previously measured 8 mm. No axillary adenopathy. Stable right-sided Port-A-Cath. There is a new central depression type fracture or large Smalls node involving L2. Could not exclude the possibility of a pathologic fracture. It appeared completely normal on the prior CT scan from March. MRI lumbar spine without and with contrast may be helpful for further evaluation CT ABDOMEN PELVIS FINDINGS Hepatobiliary: No focal hepatic lesions or intrahepatic biliary dilatation. Gallbladder appears normal. No common bile duct dilatation. Pancreas: No mass, inflammation or ductal dilatation. Spleen: Normal size.  No focal lesions. Adrenals/Urinary Tract: The adrenal glands are normal in stable. Status post right nephro ureterectomy. No findings for residual tumor in the renal fossa. The left kidney is unremarkable. No worrisome renal lesions or collecting system abnormality on the delayed images. Stomach/Bowel: The stomach, duodenum, small bowel and colon are grossly normal. No inflammatory changes, mass lesions or obstructive findings. Advanced sigmoid colon diverticulosis but no findings for acute diverticulitis. The terminal ileum and appendix are normal. Vascular/Lymphatic: Stable appearing retroperitoneal lymphadenopathy. The dominant left para-aortic node on image number 77 measures 17 mm and previously measured 18 mm. More inferiorly near the iliac artery bifurcation an 11 mm left-sided retroperitoneal node on image number 90 previously measured 12 mm. Right-sided pelvic adenopathy appears stable. 10 mm external iliac lymph node on image number 105 is unchanged. 13 mm right external iliac lymph node on image number  110 previously measured 11.5 mm. Soft tissue lesion in the region of the resected right ureter distally on image number 114 measures 13 x 9.5 mm and previously measured 11.5 x 10 mm. Adjacent 8 mm node on image number 113 is stable. Reproductive: The prostate gland and seminal vesicles are unremarkable. Penile prosthesis is noted. Other: Small inguinal hernias containing fat. No inguinal adenopathy. Musculoskeletal: No worrisome lytic or sclerotic bone lesions involving the pelvis. IMPRESSION: 1. Slight interval progression of pulmonary metastatic disease with some enlarging and some new pulmonary nodules. 2. Stable left supraclavicular lymph nodes. 3. Overall stable retroperitoneal and pelvic lymphadenopathy. 4. New compression fracture of L2. Suspicious for a pathologic fracture. MRI lumbar spine without and with contrast may be helpful for further evaluation. No spinal canal compromise. Electronically Signed  By: Marijo Sanes M.D.   On: 03/11/2019 17:03   Mr Lumbar Spine Wo Contrast  Result Date: 03/28/2019 CLINICAL DATA:  Nontraumatic compression fracture lumbar spine. History of bladder cancer metastatic. EXAM: MRI LUMBAR SPINE WITHOUT CONTRAST TECHNIQUE: Multiplanar, multisequence MR imaging of the lumbar spine was performed. No intravenous contrast was administered. COMPARISON:  CT chest abdomen pelvis 03/11/2019 FINDINGS: Segmentation:  Normal Alignment:  Normal Vertebrae: Mild pathologic fracture of L2. Diffuse infiltration of bone marrow L2 vertebral body consistent with metastatic disease. Mild tumor infiltration L3 vertebral body anteriorly. Conus medullaris and cauda equina: Conus extends to the L1-2 level. Conus and cauda equina appear normal. Paraspinal and other soft tissues: Mild soft tissue thickening to the right and anterior to L2 consistent with extraosseous tumor. No significant epidural tumor in the canal. Right nephrectomy. Disc levels: L1-2: Negative L2-3: Mild disc bulging and mild  spinal stenosis. No significant epidural tumor identified. L3-4: Mild spinal stenosis.  Disc bulging and facet degeneration L4-5: Disc bulging and spurring. Mild facet degeneration. Mild subarticular stenosis bilaterally L5-S1: Mild disc degeneration without significant stenosis. IMPRESSION: Mild pathologic fracture of L2. Tumor infiltration L2 and L3 vertebral bodies. Mild paraspinous tumor extension outside of the L2 vertebral body anteriorly and to the right. No significant epidural tumor. Lumbar degenerative changes as above. Electronically Signed   By: Franchot Gallo M.D.   On: 03/28/2019 10:07   Ct Abdomen Pelvis W Contrast  Result Date: 03/11/2019 CLINICAL DATA:  Restaging uroepithelial cancer status post nephrectomy EXAM: CT CHEST, ABDOMEN, AND PELVIS WITH CONTRAST TECHNIQUE: Multidetector CT imaging of the chest, abdomen and pelvis was performed following the standard protocol during bolus administration of intravenous contrast. CONTRAST:  168mL OMNIPAQUE IOHEXOL 300 MG/ML  SOLN COMPARISON:  CT scan 01/05/2019 FINDINGS: CT CHEST FINDINGS Cardiovascular: The heart is normal in size. No pericardial effusion. Stable mild tortuosity and calcification of the thoracic aorta but no aneurysm or dissection. The branch vessels are patent. Mediastinum/Nodes: Stable small scattered mediastinal and hilar lymph nodes but no mass or overt adenopathy. The esophagus is grossly normal. Lungs/Pleura: Stable emphysematous changes and areas of pulmonary scarring. Again noted are numerous clustered pulmonary nodules in both lungs consistent with metastatic disease. Most of these nodules are relatively stable but some have enlarged and there are new nodules. 8 mm right middle lobe nodule on image number 87 previously measured 6.5 mm. Clustered irregular nodular density in the right lower lobe on image number 111 measures 18.5 mm and previously measured 14.5 mm. Bilobed nodular density in the left lower lobe on image number 137  measures 17 mm and is new. 5.5 mm right lower lobe nodule on image number 132 is new. Musculoskeletal: No chest wall mass is identified. Left supraclavicular lymph nodes are again demonstrated. The larger more anterior node on image number 5 measures 10.5 mm and is unchanged. Slightly more inferiorly there is an 8.5 mm nodule which previously measured 8 mm. No axillary adenopathy. Stable right-sided Port-A-Cath. There is a new central depression type fracture or large Smalls node involving L2. Could not exclude the possibility of a pathologic fracture. It appeared completely normal on the prior CT scan from March. MRI lumbar spine without and with contrast may be helpful for further evaluation CT ABDOMEN PELVIS FINDINGS Hepatobiliary: No focal hepatic lesions or intrahepatic biliary dilatation. Gallbladder appears normal. No common bile duct dilatation. Pancreas: No mass, inflammation or ductal dilatation. Spleen: Normal size.  No focal lesions. Adrenals/Urinary Tract: The adrenal glands are normal in  stable. Status post right nephro ureterectomy. No findings for residual tumor in the renal fossa. The left kidney is unremarkable. No worrisome renal lesions or collecting system abnormality on the delayed images. Stomach/Bowel: The stomach, duodenum, small bowel and colon are grossly normal. No inflammatory changes, mass lesions or obstructive findings. Advanced sigmoid colon diverticulosis but no findings for acute diverticulitis. The terminal ileum and appendix are normal. Vascular/Lymphatic: Stable appearing retroperitoneal lymphadenopathy. The dominant left para-aortic node on image number 77 measures 17 mm and previously measured 18 mm. More inferiorly near the iliac artery bifurcation an 11 mm left-sided retroperitoneal node on image number 90 previously measured 12 mm. Right-sided pelvic adenopathy appears stable. 10 mm external iliac lymph node on image number 105 is unchanged. 13 mm right external iliac lymph  node on image number 110 previously measured 11.5 mm. Soft tissue lesion in the region of the resected right ureter distally on image number 114 measures 13 x 9.5 mm and previously measured 11.5 x 10 mm. Adjacent 8 mm node on image number 113 is stable. Reproductive: The prostate gland and seminal vesicles are unremarkable. Penile prosthesis is noted. Other: Small inguinal hernias containing fat. No inguinal adenopathy. Musculoskeletal: No worrisome lytic or sclerotic bone lesions involving the pelvis. IMPRESSION: 1. Slight interval progression of pulmonary metastatic disease with some enlarging and some new pulmonary nodules. 2. Stable left supraclavicular lymph nodes. 3. Overall stable retroperitoneal and pelvic lymphadenopathy. 4. New compression fracture of L2. Suspicious for a pathologic fracture. MRI lumbar spine without and with contrast may be helpful for further evaluation. No spinal canal compromise. Electronically Signed   By: Marijo Sanes M.D.   On: 03/11/2019 17:03   Vas [place] Lower Ext Art Seg Multi (segmentals & [place])  Result Date: 03/08/2019 LOWER EXTREMITY DOPPLER STUDY Indications: Patient states that he has pain and cramping in his right leg when              walking varying distances. High Risk Factors: Hypertension, hyperlipidemia, past history of smoking,                    coronary artery disease.  Performing Technologist: Wilkie Aye RVT  Examination Guidelines: A complete evaluation includes at minimum, Doppler waveform signals and systolic blood pressure reading at the level of bilateral brachial, anterior tibial, and posterior tibial arteries, when vessel segments are accessible. Bilateral testing is considered an integral part of a complete examination. Photoelectric Plethysmograph (PPG) waveforms and toe systolic pressure readings are included as required and additional duplex testing as needed. Limited examinations for reoccurring indications may be performed as noted.  ABI  Findings: +---------+------------------+-----+-------------------+--------+  Right     Rt Pressure (mmHg) Index Waveform            Comment   +---------+------------------+-----+-------------------+--------+  Brachial  133                                                    +---------+------------------+-----+-------------------+--------+  CFA                                triphasic                     +---------+------------------+-----+-------------------+--------+  Popliteal  dampened monophasic           +---------+------------------+-----+-------------------+--------+  ATA       81                 0.61  monophasic                    +---------+------------------+-----+-------------------+--------+  PTA       92                 0.69  dampened monophasic           +---------+------------------+-----+-------------------+--------+  PERO      77                 0.58  monophasic                    +---------+------------------+-----+-------------------+--------+  Great Toe 81                 0.61  Abnormal                      +---------+------------------+-----+-------------------+--------+ +---------+------------------+-----+---------+-------+  Left      Lt Pressure (mmHg) Index Waveform  Comment  +---------+------------------+-----+---------+-------+  Brachial  127                                         +---------+------------------+-----+---------+-------+  CFA                                triphasic          +---------+------------------+-----+---------+-------+  Popliteal                          triphasic          +---------+------------------+-----+---------+-------+  ATA       122                0.92  biphasic           +---------+------------------+-----+---------+-------+  PTA       134                1.01  biphasic           +---------+------------------+-----+---------+-------+  PERO      159                1.20  biphasic            +---------+------------------+-----+---------+-------+  Great Toe 106                0.80  Normal             +---------+------------------+-----+---------+-------+ +-------+-----------+-----------+------------+------------+  ABI/TBI Today's ABI Today's TBI Previous ABI Previous TBI  +-------+-----------+-----------+------------+------------+  Right   0.69        0.61                                   +-------+-----------+-----------+------------+------------+  Left    1.20        0.80                                   +-------+-----------+-----------+------------+------------+  Summary: Right: Resting  right ankle-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 The right toe-brachial index is abnormal. Left: Resting left ankle-brachial index is within normal range. No evidence of significant left lower extremity arterial disease. The left toe-brachial index is normal.  *See table(s) above for measurements and observations.  Vascular consult recommended. Electronically signed by Quay Burow MD on 03/08/2019 at 1:41:41 PM.    Final    Vas [place] Lower Extremity Arterial Duplex  Result Date: 03/08/2019 LOWER EXTREMITY ARTERIAL DUPLEX STUDY Indications: Patient states that he has pain and cramping in his right leg when              walking varying distances. High Risk Factors: Hypertension, hyperlipidemia, past history of smoking,                    coronary artery disease.  Current ABI: Today the right ABI was 0.69 and left 1.20 Performing Technologist: Wilkie Aye RVT  Examination Guidelines: A complete evaluation includes B-mode imaging, spectral Doppler, color Doppler, and power Doppler as needed of all accessible portions of each vessel. Bilateral testing is considered an integral part of a complete examination. Limited examinations for reoccurring indications may be performed as noted.  +-----------+--------+-----+--------+----------+--------+  RIGHT       PSV cm/s Ratio Stenosis Waveform    Comments  +-----------+--------+-----+--------+----------+--------+  CFA Prox    151                     triphasic            +-----------+--------+-----+--------+----------+--------+  CFA Distal  73                      triphasic            +-----------+--------+-----+--------+----------+--------+  DFA         163                     biphasic             +-----------+--------+-----+--------+----------+--------+  SFA Prox    43                      biphasic             +-----------+--------+-----+--------+----------+--------+  SFA Mid     91                      biphasic             +-----------+--------+-----+--------+----------+--------+  SFA Distal                 occluded                      +-----------+--------+-----+--------+----------+--------+  POP Prox    18                      monophasic           +-----------+--------+-----+--------+----------+--------+  POP Distal  17                      monophasic           +-----------+--------+-----+--------+----------+--------+  TP Trunk    26                      monophasic           +-----------+--------+-----+--------+----------+--------+  ATA Prox    34                      monophasic           +-----------+--------+-----+--------+----------+--------+  PTA Distal  20                      monophasic           +-----------+--------+-----+--------+----------+--------+  PERO Distal 16                      monophasic           +-----------+--------+-----+--------+----------+--------+  +-----------+--------+-----+--------+--------+--------+  LEFT        PSV cm/s Ratio Stenosis Waveform Comments  +-----------+--------+-----+--------+--------+--------+  CFA Prox    145                     biphasic           +-----------+--------+-----+--------+--------+--------+  CFA Distal  82                      biphasic           +-----------+--------+-----+--------+--------+--------+  DFA         75                      biphasic            +-----------+--------+-----+--------+--------+--------+  SFA Prox    77                      biphasic           +-----------+--------+-----+--------+--------+--------+  SFA Mid     102                     biphasic           +-----------+--------+-----+--------+--------+--------+  SFA Distal  109                     biphasic           +-----------+--------+-----+--------+--------+--------+  POP Prox    91                      biphasic           +-----------+--------+-----+--------+--------+--------+  POP Distal  62                      biphasic           +-----------+--------+-----+--------+--------+--------+  TP Trunk    70                      biphasic           +-----------+--------+-----+--------+--------+--------+  ATA Distal  94                      biphasic           +-----------+--------+-----+--------+--------+--------+  PTA Prox    79                      biphasic           +-----------+--------+-----+--------+--------+--------+  PERO Distal 40                      biphasic           +-----------+--------+-----+--------+--------+--------+  Summary: Right: Atherosclerosis in the common femoral, femoral, popliteal and tibial arteries. Short segment occlusion in the mid/distal SFA with reconstitution at the above knee popliteal. Three vessel run off. Left: Atherosclerosis in the common femoral, femoral, popliteal and tibial arteries with no focal stenosis. Three vessel run off.  See table(s) above for measurements and observations. Vascular consult recommended. Electronically signed by Quay Burow MD on 03/08/2019 at 1:38:25 PM.    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  PDL 1 0%  PLAN:    1.  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  Patient received his last dose of cisplatin and gemcitabine  on May 27, 2018.  Because patient had previous nephrectomy, he was given a split dose of cisplatin.  CT scan results from March 11, 2019 reviewed independently with continued increase in size and number of pulmonary nodules.  MRI of his thoracic spine also revealed metastatic disease.  Patient wishes to delay cycle 1, day 8 of cisplatin and gemcitabine today secondary to his significant back pain.  Continue daily XRT.  Return to clinic in 1 week for further evaluation and reconsideration of treatment.  2.  Genetic testing: Negative. 3.  Kidney function: Patient's creatinine continues to be within normal limits. 4.  Cardiac disease: Continue follow-up with cardiology as indicated. 5.  Back pain: MRI results from March 27, 2019 reviewed independently and reported as above consistent with metastatic disease.  Patient has an appointment with radiation oncology later today.  He was given a prescription for hydrocodone for symptomatic relief.  Return to clinic in 1 week as above. 6.  Constipation: Continue current bowel regimen and have recommended adding MiraLAX or magnesium citrate OTC.  Patient expressed understanding and was in agreement with this plan. He also understands that He can call clinic at any time with any questions, concerns, or complaints.   Cancer Staging Urothelial carcinoma of bladder [facility]) Staging form: Urinary Bladder, AJCC 8th Edition - Clinical: Stage IVA [place], cN2, cM1a) - Signed by [name], MD on 04/09/2018   [name], MD   04/02/2019 7:12 AM

## 2019-03-30 ENCOUNTER — Other Ambulatory Visit: Payer: Self-pay | Admitting: *Deleted

## 2019-03-30 ENCOUNTER — Telehealth: Payer: Self-pay | Admitting: *Deleted

## 2019-03-30 DIAGNOSIS — C679 Malignant neoplasm of bladder, unspecified: Secondary | ICD-10-CM

## 2019-03-30 NOTE — Telephone Encounter (Signed)
Yes, that is reasonable.

## 2019-03-30 NOTE — Telephone Encounter (Signed)
Referral has been placed. Thanks!

## 2019-03-30 NOTE — Telephone Encounter (Signed)
Daughter called asking for radiation therapy referral. "His PCP ordered MRI for back pain and results show spread into L2 and 3"  IMPRESSION: Mild pathologic fracture of L2. Tumor infiltration L2 and L3 vertebral bodies. Mild paraspinous tumor extension outside of the L2 vertebral body anteriorly and to the right. No significant epidural tumor.  Lumbar degenerative changes as above.   Electronically Signed   By: Franchot Gallo M.D.   On: 03/28/2019 10:07

## 2019-03-30 NOTE — Telephone Encounter (Signed)
Kim or [place], can you schedule an appointment for this gentle man to be seen by Dr Baruch Gouty. He has an app with Dr Grayland Ormond Thursday morning

## 2019-04-01 ENCOUNTER — Inpatient Hospital Stay: Payer: Medicare Other | Admitting: *Deleted

## 2019-04-01 ENCOUNTER — Inpatient Hospital Stay (HOSPITAL_BASED_OUTPATIENT_CLINIC_OR_DEPARTMENT_OTHER): Payer: Medicare Other | Admitting: Oncology

## 2019-04-01 ENCOUNTER — Ambulatory Visit
Admission: RE | Admit: 2019-04-01 | Discharge: 2019-04-01 | Disposition: A | Discharge status: Home / Self Care | Payer: Medicare Other | Source: Ambulatory Visit | Attending: Radiation Oncology | Admitting: Radiation Oncology

## 2019-04-01 ENCOUNTER — Inpatient Hospital Stay: Payer: Medicare Other

## 2019-04-01 ENCOUNTER — Encounter: Payer: Self-pay | Admitting: Oncology

## 2019-04-01 ENCOUNTER — Telehealth: Payer: Self-pay | Admitting: *Deleted

## 2019-04-01 ENCOUNTER — Other Ambulatory Visit: Payer: Self-pay

## 2019-04-01 VITALS — BP 144/84 | HR 66 | Temp 96.9°F | Ht 72.0 in | Wt 191.3 lb

## 2019-04-01 DIAGNOSIS — Z803 Family history of malignant neoplasm of breast: Secondary | ICD-10-CM | POA: Insufficient documentation

## 2019-04-01 DIAGNOSIS — C801 Malignant (primary) neoplasm, unspecified: Secondary | ICD-10-CM

## 2019-04-01 DIAGNOSIS — I10 Essential (primary) hypertension: Secondary | ICD-10-CM | POA: Diagnosis not present

## 2019-04-01 DIAGNOSIS — C7951 Secondary malignant neoplasm of bone: Secondary | ICD-10-CM

## 2019-04-01 DIAGNOSIS — Z7982 Long term (current) use of aspirin: Secondary | ICD-10-CM | POA: Diagnosis not present

## 2019-04-01 DIAGNOSIS — R918 Other nonspecific abnormal finding of lung field: Secondary | ICD-10-CM | POA: Insufficient documentation

## 2019-04-01 DIAGNOSIS — Z95828 Presence of other vascular implants and grafts: Secondary | ICD-10-CM

## 2019-04-01 DIAGNOSIS — C679 Malignant neoplasm of bladder, unspecified: Secondary | ICD-10-CM | POA: Diagnosis not present

## 2019-04-01 DIAGNOSIS — C771 Secondary and unspecified malignant neoplasm of intrathoracic lymph nodes: Secondary | ICD-10-CM | POA: Diagnosis not present

## 2019-04-01 DIAGNOSIS — I2510 Atherosclerotic heart disease of native coronary artery without angina pectoris: Secondary | ICD-10-CM | POA: Diagnosis not present

## 2019-04-01 DIAGNOSIS — Z905 Acquired absence of kidney: Secondary | ICD-10-CM | POA: Diagnosis not present

## 2019-04-01 DIAGNOSIS — C680 Malignant neoplasm of urethra: Secondary | ICD-10-CM | POA: Diagnosis not present

## 2019-04-01 DIAGNOSIS — C770 Secondary and unspecified malignant neoplasm of lymph nodes of head, face and neck: Secondary | ICD-10-CM

## 2019-04-01 DIAGNOSIS — Z5111 Encounter for antineoplastic chemotherapy: Secondary | ICD-10-CM | POA: Diagnosis not present

## 2019-04-01 DIAGNOSIS — Z79899 Other long term (current) drug therapy: Secondary | ICD-10-CM | POA: Insufficient documentation

## 2019-04-01 DIAGNOSIS — Z9221 Personal history of antineoplastic chemotherapy: Secondary | ICD-10-CM | POA: Diagnosis not present

## 2019-04-01 DIAGNOSIS — C61 Malignant neoplasm of prostate: Secondary | ICD-10-CM

## 2019-04-01 DIAGNOSIS — E785 Hyperlipidemia, unspecified: Secondary | ICD-10-CM | POA: Diagnosis not present

## 2019-04-01 DIAGNOSIS — Z87891 Personal history of nicotine dependence: Secondary | ICD-10-CM | POA: Diagnosis not present

## 2019-04-01 LAB — CBC WITH DIFFERENTIAL/PLATELET
Abs Immature Granulocytes: 0.01 10*3/uL (ref 0.00–0.07)
Basophils Absolute: 0 10*3/uL (ref 0.0–0.1)
Basophils Relative: 1 %
Eosinophils Absolute: 0.1 10*3/uL (ref 0.0–0.5)
Eosinophils Relative: 2 %
HCT: 35.9 % — ABNORMAL LOW (ref 39.0–52.0)
Hemoglobin: 12 g/dL — ABNORMAL LOW (ref 13.0–17.0)
Immature Granulocytes: 0 %
Lymphocytes Relative: 29 %
Lymphs Abs: 1 10*3/uL (ref 0.7–4.0)
MCH: 31.5 pg (ref 26.0–34.0)
MCHC: 33.4 g/dL (ref 30.0–36.0)
MCV: 94.2 fL (ref 80.0–100.0)
Monocytes Absolute: 0.3 10*3/uL (ref 0.1–1.0)
Monocytes Relative: 7 %
Neutro Abs: 2.2 10*3/uL (ref 1.7–7.7)
Neutrophils Relative %: 61 %
Platelets: 148 10*3/uL — ABNORMAL LOW (ref 150–400)
RBC: 3.81 MIL/uL — ABNORMAL LOW (ref 4.22–5.81)
RDW: 13.8 % (ref 11.5–15.5)
WBC: 3.6 10*3/uL — ABNORMAL LOW (ref 4.0–10.5)
nRBC: 0 % (ref 0.0–0.2)

## 2019-04-01 LAB — COMPREHENSIVE METABOLIC PANEL
ALT: 31 U/L (ref 0–44)
AST: 18 U/L (ref 15–41)
Albumin: 3.8 g/dL (ref 3.5–5.0)
Alkaline Phosphatase: 92 U/L (ref 38–126)
Anion gap: 8 (ref 5–15)
BUN: 17 mg/dL (ref 8–23)
CO2: 25 mmol/L (ref 22–32)
Calcium: 8.8 mg/dL — ABNORMAL LOW (ref 8.9–10.3)
Chloride: 105 mmol/L (ref 98–111)
Creatinine, Ser: 1.1 mg/dL (ref 0.61–1.24)
GFR calc Af Amer: >60 mL/min (ref >60)
GFR calc non Af Amer: >60 mL/min (ref >60)
Glucose, Bld: 127 mg/dL — ABNORMAL HIGH (ref 70–99)
Potassium: 3.7 mmol/L (ref 3.5–5.1)
Sodium: 138 mmol/L (ref 135–145)
Total Bilirubin: 0.5 mg/dL (ref 0.3–1.2)
Total Protein: 6.9 g/dL (ref 6.5–8.1)

## 2019-04-01 MED ORDER — HEPARIN SOD (PORK) LOCK FLUSH 100 UNIT/ML IV SOLN
INTRAVENOUS | Status: AC
  Filled 2019-04-01: qty 5

## 2019-04-01 MED ORDER — HEPARIN SOD (PORK) LOCK FLUSH 100 UNIT/ML IV SOLN
500.0000 [IU] | Freq: Once | INTRAVENOUS | Status: AC
  Administered 2019-04-01: 500 [IU] via INTRAVENOUS

## 2019-04-01 MED ORDER — SODIUM CHLORIDE 0.9% FLUSH
10.0000 mL | Freq: Once | INTRAVENOUS | Status: AC
  Administered 2019-04-01: 10 mL via INTRAVENOUS
  Filled 2019-04-01: qty 10

## 2019-04-01 MED ORDER — HYDROCODONE-ACETAMINOPHEN 5-325 MG PO TABS: 1.0000 | ORAL_TABLET | Freq: Four times a day (QID) | ORAL | 0 refills | Status: DC | PRN

## 2019-04-01 NOTE — Telephone Encounter (Signed)
Yes.  120 tabs.

## 2019-04-01 NOTE — Consult Note (Signed)
NEW PATIENT EVALUATION  Name: David Rich  MRN: [id number]  Date:   04/01/2019     DOB: 1946-06-16   This 73 y.o. 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  REFERRING PHYSICIAN: Baxter Hire, MD  CHIEF COMPLAINT:  Chief Complaint  Patient presents with  . Cancer    Initial consultation    DIAGNOSIS: The encounter diagnosis was Bone metastasis ([place]).   PREVIOUS INVESTIGATIONS:  MRI scans reviewed CT scans reviewed Clinical notes reviewed Pathology report reviewed  HPI: Patient is a 73 year old male with known stage IV urothelial carcinoma with increasing lower back pain.  He is currently on cis-platinum and gemcitabine with only complaint being increasing lower back pain.  He has known supraclavicular biopsy-proven metastatic disease.  He is also status post nephrectomy.  He does have increased size and number of pulmonary nodules consistent with progressive metastatic disease.  MRI scan of his lumbar spine showed a mild pathologic fracture of L2 with tumor infiltration of L2 and L3 vertebral bodies.  There is also mild paraspinous tumor extension outside of the L2 vertebral body.  Patient has no focal neurologic deficits does have pain on ambulation.  Proprioception is intact.  PLANNED TREATMENT REGIMEN: Palliative radiation therapy  PAST MEDICAL HISTORY:  has a past medical history of Cancer ([place]), Hyperlipidemia, Hypertension, and Renal disorder.    PAST SURGICAL HISTORY:  Past Surgical History:  Procedure Laterality Date  . CORONARY STENT INTERVENTION N/A 09/28/2018   Procedure: CORONARY STENT INTERVENTION;  Surgeon: [name], MD;  Location: [place] [facility];  Service: Cardiovascular;  Laterality: N/A;  . CORONARY STENT PLACEMENT    . IR FLUORO GUIDE CV LINE RIGHT  04/03/2018  . kidney removed Left 2013  . LEFT HEART CATH AND CORONARY ANGIOGRAPHY N/A  09/28/2018   Procedure: LEFT HEART CATH AND CORONARY ANGIOGRAPHY;  Surgeon: [name], MD;  Location: [place] [facility];  Service: Cardiovascular;  Laterality: N/A;    FAMILY HISTORY: family history includes Anemia in his mother; Breast cancer (age of onset: 29) in his sister; Coronary artery disease in his mother; Hypertension in his brother; Kidney failure in his mother; Subarachnoid hemorrhage in his father.  SOCIAL HISTORY:  reports that he quit smoking about 7 years ago. His smoking use included cigarettes. He has a 55.00 pack-year smoking history. He has never used smokeless tobacco. He reports current alcohol use. He reports that he does not use drugs.  ALLERGIES: Clopidogrel, Rosuvastatin calcium, Etodolac, Hydrocodone, Other, Statins, Meloxicam, and Pravastatin sodium  MEDICATIONS:  Current Outpatient Medications  Medication Sig Dispense Refill  . acetaminophen (TYLENOL) 325 MG tablet Take 325 mg by mouth every 4 (four) hours as needed.     [name] amLODipine (NORVASC) 10 MG tablet TAKE 1 TABLET BY MOUTH EVERY DAY    . aspirin EC 81 MG tablet Take by mouth.    . carvedilol (COREG) 6.25 MG tablet Take 1 tablet (6.25 mg total) by mouth 2 (two) times daily. 60 tablet 6  . clopidogrel (PLAVIX) 75 MG tablet Take 75 mg by mouth daily.    [name] ezetimibe (ZETIA) 10 MG tablet TAKE 1 TABLET BY MOUTH DAILY 90 tablet 0  . famotidine (PEPCID) 20 MG tablet Take 20 mg by mouth 2 (two) times daily.     . Garlic 329 MG TABS Take by mouth.     . lidocaine-prilocaine (EMLA) cream Apply to affected area  once 30 g 3  . Multiple Vitamin (MULTI-VITAMINS) TABS Take by mouth.    . nitroGLYCERIN (NITROSTAT) 0.4 MG SL tablet Place under the tongue.    . Omega-3 Fatty Acids (FISH OIL OMEGA-3) 1000 MG CAPS Take by mouth daily.     No current facility-administered medications for this encounter.    Facility-Administered Medications Ordered in Other Encounters  Medication Dose Route Frequency Provider Last  Rate Last Dose  . sodium chloride flush (NS) 0.9 % injection 10 mL  10 mL Intravenous PRN [name], MD   10 mL at 10/05/18 1026    ECOG PERFORMANCE STATUS:  1 - Symptomatic but completely ambulatory  REVIEW OF SYSTEMS: Patient denies any weight loss, fatigue, weakness, fever, chills or night sweats. Patient denies any loss of vision, blurred vision. Patient denies any ringing  of the ears or hearing loss. No irregular heartbeat. Patient denies heart murmur or history of fainting. Patient denies any chest pain or pain radiating to her upper extremities. Patient denies any shortness of breath, difficulty breathing at night, cough or hemoptysis. Patient denies any swelling in the lower legs. Patient denies any nausea vomiting, vomiting of blood, or coffee ground material in the vomitus. Patient denies any stomach pain. Patient states has had normal bowel movements no significant constipation or diarrhea. Patient denies any dysuria, hematuria or significant nocturia. Patient denies any problems walking, swelling in the joints or loss of balance. Patient denies any skin changes, loss of hair or loss of weight. Patient denies any excessive worrying or anxiety or significant depression. Patient denies any problems with insomnia. Patient denies excessive thirst, polyuria, polydipsia. Patient denies any swollen glands, patient denies easy bruising or easy bleeding. Patient denies any recent infections, allergies or URI. Patient "s visual fields have not changed significantly in recent time.   PHYSICAL EXAM: There were no vitals taken for this visit. Deep palpation of his lumbar spine does not elicit pain.  Motor sensory and DTR levels in his lower extremities are equal and symmetric.  Proprioception is intact.  Well-developed well-nourished patient in NAD. HEENT reveals PERLA, EOMI, discs not visualized.  Oral cavity is clear. No oral mucosal lesions are identified. Neck is clear without evidence of  cervical or supraclavicular adenopathy. Lungs are clear to A&P. Cardiac examination is essentially unremarkable with regular rate and rhythm without murmur rub or thrill. Abdomen is benign with no organomegaly or masses noted. Motor sensory and DTR levels are equal and symmetric in the upper and lower extremities. Cranial nerves II through XII are grossly intact. Proprioception is intact. No peripheral adenopathy or edema is identified. No motor or sensory levels are noted. Crude visual fields are within normal range.  LABORATORY DATA: Pathology report reviewed    RADIOLOGY RESULTS: CT scans and MRI scan reviewed and compatible with above-stated findings   IMPRESSION: Stage IV urothelial carcinoma with metastatic disease to his lumbar spine in 73 year old male  PLAN: At this time I have recommended palliative radiation therapy to his lumbar spine.  Would plan on delivering 3000 cGy in 10 fractions.  Risks and benefits of treatment occluding possible diarrhea fatigue skin reaction and alteration of blood counts were discussed in detail with the patient.  He comprehends her treatment plan well.  I have personally set up and ordered CT simulation for tomorrow.  I would like to take this opportunity to thank you for allowing me to participate in the care of your patient.Noreene Filbert, MD

## 2019-04-01 NOTE — Telephone Encounter (Signed)
Patient called asking if Dr Grayland Ormond is going to call in a months worth of medicine to help him get through with his Radiation Therapy. Plaese advise

## 2019-04-01 NOTE — Progress Notes (Signed)
Patient stated that he had no been feeling well. Patient stated that he's had low back pain, abdominal pain (soreness,bloated) and had been constipated even though he takes stool softeners. Patient stated that he needs a refill on his Hydrocodone-Acetaminophen. Patient denied nausea, vomiting or diarrhea. Patient would like to know if he could skip chemo today.

## 2019-04-02 ENCOUNTER — Ambulatory Visit
Admission: RE | Admit: 2019-04-02 | Discharge: 2019-04-02 | Disposition: A | Discharge status: Home / Self Care | Payer: Medicare Other | Source: Ambulatory Visit | Attending: Radiation Oncology | Admitting: Radiation Oncology

## 2019-04-02 ENCOUNTER — Other Ambulatory Visit: Payer: Self-pay

## 2019-04-02 DIAGNOSIS — C7951 Secondary malignant neoplasm of bone: Secondary | ICD-10-CM | POA: Insufficient documentation

## 2019-04-02 DIAGNOSIS — C681 Malignant neoplasm of paraurethral glands: Secondary | ICD-10-CM | POA: Diagnosis not present

## 2019-04-02 DIAGNOSIS — Z87891 Personal history of nicotine dependence: Secondary | ICD-10-CM | POA: Insufficient documentation

## 2019-04-02 DIAGNOSIS — C770 Secondary and unspecified malignant neoplasm of lymph nodes of head, face and neck: Secondary | ICD-10-CM | POA: Insufficient documentation

## 2019-04-02 DIAGNOSIS — Z510 Encounter for antineoplastic radiation therapy: Secondary | ICD-10-CM | POA: Insufficient documentation

## 2019-04-04 NOTE — Progress Notes (Signed)
[place]  [phone]) [phone] Fax:(336) [phone]  ID: David Rich OB: [birth year]  MR#: [id number]  LZJ#:[id number]  Patient Care Team: Baxter Hire, MD as PCP - General (Internal Medicine) [name], MD as PCP - Cardiology (Cardiology) [name], MD as Consulting Physician (Oncology)  CHIEF COMPLAINT: Recurrent stage IVa urothelial carcinoma with left supraclavicular lymph node metastasis.  INTERVAL HISTORY: Patient returns to clinic today for further evaluation and reconsideration of cycle 1, day 8 of cisplatin and gemcitabine.  He has initiated XRT to his back and his pain has significantly improved, although not resolved.  He does not complain of constipation today. He has no neurologic complaints.  He denies any recent fevers or illnesses.  He has a fair appetite and denies weight loss.  He denies any chest pain, shortness of breath, cough, or hemoptysis.  He denies any nausea, vomiting, or diarrhea.  He has no urinary complaints.  Patient offers no further specific complaints today.  REVIEW OF SYSTEMS:   Review of Systems  Constitutional: Negative.  Negative for fever, malaise/fatigue and weight loss.  Respiratory: Negative.  Negative for cough and shortness of breath.   Cardiovascular: Negative.  Negative for chest pain and leg swelling.  Gastrointestinal: Negative.  Negative for abdominal pain, constipation, diarrhea, nausea and vomiting.  Genitourinary: Negative.  Negative for dysuria and hematuria.  Musculoskeletal: Positive for back pain.  Skin: Negative.  Negative for rash.  Neurological: Negative.  Negative for sensory change, focal weakness, weakness and headaches.  Psychiatric/Behavioral: Negative.  The patient is not nervous/anxious.     As per HPI. Otherwise, a complete review of systems is negative.  PAST MEDICAL HISTORY: Past Medical History:  Diagnosis Date   Cancer [facility])    prostate   Hyperlipidemia     Hypertension    Renal disorder     PAST SURGICAL HISTORY: Past Surgical History:  Procedure Laterality Date   CORONARY STENT INTERVENTION N/A 09/28/2018   Procedure: CORONARY STENT INTERVENTION;  Surgeon: [name], MD;  Location: [place] [facility];  Service: Cardiovascular;  Laterality: N/A;   CORONARY STENT PLACEMENT     IR FLUORO GUIDE CV LINE RIGHT  04/03/2018   kidney removed Left 2013   LEFT HEART CATH AND CORONARY ANGIOGRAPHY N/A 09/28/2018   Procedure: LEFT HEART CATH AND CORONARY ANGIOGRAPHY;  Surgeon: [name], MD;  Location: [place] [facility];  Service: Cardiovascular;  Laterality: N/A;    FAMILY HISTORY: Family History  Problem Relation Age of Onset   Coronary artery disease Mother    Anemia Mother    Kidney failure Mother    Subarachnoid hemorrhage Father    Breast cancer Sister 65   Hypertension Brother     ADVANCED DIRECTIVES (Y/N):  N  HEALTH MAINTENANCE: Social History   Tobacco Use   Smoking status: Former Smoker    Packs/day: 1.00    Years: 55.00    Pack years: 55.00    Types: Cigarettes    Quit date: 03/20/2012    Years since quitting: 7.0   Smokeless tobacco: Never Used  Substance Use Topics   Alcohol use: Yes    Comment: Social   Drug use: No     Colonoscopy:  PAP:  Bone density:  Lipid panel:  Allergies  Allergen Reactions   Clopidogrel Rash and Hives   Rosuvastatin Calcium Anaphylaxis   Etodolac Nausea And Vomiting and Other (See Comments)    Bad dreams Bad dreams  Hydrocodone Other (See Comments)    Bad dreams Bad dreams    Other    Statins    Meloxicam Rash    Bad dreams Bad dreams    Pravastatin Sodium Rash    Current Outpatient Medications  Medication Sig Dispense Refill   acetaminophen (TYLENOL) 325 MG tablet Take 325 mg by mouth every 4 (four) hours as needed.      amLODipine (NORVASC) 10 MG tablet TAKE 1 TABLET BY MOUTH EVERY DAY     aspirin EC 81 MG tablet  Take by mouth.     carvedilol (COREG) 6.25 MG tablet Take 1 tablet (6.25 mg total) by mouth 2 (two) times daily. 60 tablet 6   clopidogrel (PLAVIX) 75 MG tablet Take 75 mg by mouth daily.     ezetimibe (ZETIA) 10 MG tablet TAKE 1 TABLET BY MOUTH DAILY 90 tablet 0   famotidine (PEPCID) 20 MG tablet Take 20 mg by mouth 2 (two) times daily.      Garlic 756 MG TABS Take by mouth.      HYDROcodone-acetaminophen (NORCO/VICODIN) 5-325 MG tablet Take 1 tablet by mouth every 6 (six) hours as needed for moderate pain. 120 tablet 0   lidocaine-prilocaine (EMLA) cream Apply to affected area once 30 g 3   Multiple Vitamin (MULTI-VITAMINS) TABS Take by mouth.     nitroGLYCERIN (NITROSTAT) 0.4 MG SL tablet Place under the tongue.     Omega-3 Fatty Acids (FISH OIL OMEGA-3) 1000 MG CAPS Take by mouth daily.     ondansetron (ZOFRAN) 8 MG tablet Take 1 tablet (8 mg total) by mouth every 8 (eight) hours as needed for nausea or vomiting. 30 tablet 3   No current facility-administered medications for this visit.    Facility-Administered Medications Ordered in Other Visits  Medication Dose Route Frequency Provider Last Rate Last Dose   sodium chloride flush (NS) 0.9 % injection 10 mL  10 mL Intravenous PRN [name], MD   10 mL at 10/05/18 1026    OBJECTIVE: Vitals:   04/08/19 0901  BP: 120/68  Pulse: (!) 59  Temp: (!) 96.3 F (35.7 C)     Body mass index is 25.71 kg/m.    ECOG FS:0 - Asymptomatic   General: Well-developed, well-nourished, no acute distress. Eyes: Pink conjunctiva, anicteric sclera. HEENT: Normocephalic, moist mucous membranes. Lungs: Clear to auscultation bilaterally. Heart: Regular rate and rhythm. No rubs, murmurs, or gallops. Abdomen: Soft, nontender, nondistended. No organomegaly noted, normoactive bowel sounds. Musculoskeletal: No edema, cyanosis, or clubbing. Neuro: Alert, answering all questions appropriately. Cranial nerves grossly intact. Skin: No  rashes or petechiae noted. Psych: Normal affect.  LAB RESULTS:  Lab Results  Component Value Date   NA 136 04/08/2019   K 3.9 04/08/2019   CL 105 04/08/2019   CO2 24 04/08/2019   GLUCOSE 135 (H) 04/08/2019   BUN 15 04/08/2019   CREATININE 1.08 04/08/2019   CALCIUM 8.6 (L) 04/08/2019   PROT 7.1 04/08/2019   ALBUMIN 3.8 04/08/2019   AST 14 (L) 04/08/2019   ALT 16 04/08/2019   ALKPHOS 96 04/08/2019   BILITOT 0.6 04/08/2019   GFRNONAA >60 04/08/2019   GFRAA >60 04/08/2019    Lab Results  Component Value Date   WBC 5.4 04/08/2019   NEUTROABS 3.3 04/08/2019   HGB 11.5 (L) 04/08/2019   HCT 35.2 (L) 04/08/2019   MCV 95.4 04/08/2019   PLT 175 04/08/2019     STUDIES: Ct Chest W Contrast  Result Date: 03/11/2019  CLINICAL DATA:  Restaging uroepithelial cancer status post nephrectomy EXAM: CT CHEST, ABDOMEN, AND PELVIS WITH CONTRAST TECHNIQUE: Multidetector CT imaging of the chest, abdomen and pelvis was performed following the standard protocol during bolus administration of intravenous contrast. CONTRAST:  182mL OMNIPAQUE IOHEXOL 300 MG/ML  SOLN COMPARISON:  CT scan 01/05/2019 FINDINGS: CT CHEST FINDINGS Cardiovascular: The heart is normal in size. No pericardial effusion. Stable mild tortuosity and calcification of the thoracic aorta but no aneurysm or dissection. The branch vessels are patent. Mediastinum/Nodes: Stable small scattered mediastinal and hilar lymph nodes but no mass or overt adenopathy. The esophagus is grossly normal. Lungs/Pleura: Stable emphysematous changes and areas of pulmonary scarring. Again noted are numerous clustered pulmonary nodules in both lungs consistent with metastatic disease. Most of these nodules are relatively stable but some have enlarged and there are new nodules. 8 mm right middle lobe nodule on image number 87 previously measured 6.5 mm. Clustered irregular nodular density in the right lower lobe on image number 111 measures 18.5 mm and previously  measured 14.5 mm. Bilobed nodular density in the left lower lobe on image number 137 measures 17 mm and is new. 5.5 mm right lower lobe nodule on image number 132 is new. Musculoskeletal: No chest wall mass is identified. Left supraclavicular lymph nodes are again demonstrated. The larger more anterior node on image number 5 measures 10.5 mm and is unchanged. Slightly more inferiorly there is an 8.5 mm nodule which previously measured 8 mm. No axillary adenopathy. Stable right-sided Port-A-Cath. There is a new central depression type fracture or large Smalls node involving L2. Could not exclude the possibility of a pathologic fracture. It appeared completely normal on the prior CT scan from March. MRI lumbar spine without and with contrast may be helpful for further evaluation CT ABDOMEN PELVIS FINDINGS Hepatobiliary: No focal hepatic lesions or intrahepatic biliary dilatation. Gallbladder appears normal. No common bile duct dilatation. Pancreas: No mass, inflammation or ductal dilatation. Spleen: Normal size.  No focal lesions. Adrenals/Urinary Tract: The adrenal glands are normal in stable. Status post right nephro ureterectomy. No findings for residual tumor in the renal fossa. The left kidney is unremarkable. No worrisome renal lesions or collecting system abnormality on the delayed images. Stomach/Bowel: The stomach, duodenum, small bowel and colon are grossly normal. No inflammatory changes, mass lesions or obstructive findings. Advanced sigmoid colon diverticulosis but no findings for acute diverticulitis. The terminal ileum and appendix are normal. Vascular/Lymphatic: Stable appearing retroperitoneal lymphadenopathy. The dominant left para-aortic node on image number 77 measures 17 mm and previously measured 18 mm. More inferiorly near the iliac artery bifurcation an 11 mm left-sided retroperitoneal node on image number 90 previously measured 12 mm. Right-sided pelvic adenopathy appears stable. 10 mm  external iliac lymph node on image number 105 is unchanged. 13 mm right external iliac lymph node on image number 110 previously measured 11.5 mm. Soft tissue lesion in the region of the resected right ureter distally on image number 114 measures 13 x 9.5 mm and previously measured 11.5 x 10 mm. Adjacent 8 mm node on image number 113 is stable. Reproductive: The prostate gland and seminal vesicles are unremarkable. Penile prosthesis is noted. Other: Small inguinal hernias containing fat. No inguinal adenopathy. Musculoskeletal: No worrisome lytic or sclerotic bone lesions involving the pelvis. IMPRESSION: 1. Slight interval progression of pulmonary metastatic disease with some enlarging and some new pulmonary nodules. 2. Stable left supraclavicular lymph nodes. 3. Overall stable retroperitoneal and pelvic lymphadenopathy. 4. New compression fracture of  L2. Suspicious for a pathologic fracture. MRI lumbar spine without and with contrast may be helpful for further evaluation. No spinal canal compromise. Electronically Signed   By: Marijo Sanes M.D.   On: 03/11/2019 17:03   Mr Lumbar Spine Wo Contrast  Result Date: 03/28/2019 CLINICAL DATA:  Nontraumatic compression fracture lumbar spine. History of bladder cancer metastatic. EXAM: MRI LUMBAR SPINE WITHOUT CONTRAST TECHNIQUE: Multiplanar, multisequence MR imaging of the lumbar spine was performed. No intravenous contrast was administered. COMPARISON:  CT chest abdomen pelvis 03/11/2019 FINDINGS: Segmentation:  Normal Alignment:  Normal Vertebrae: Mild pathologic fracture of L2. Diffuse infiltration of bone marrow L2 vertebral body consistent with metastatic disease. Mild tumor infiltration L3 vertebral body anteriorly. Conus medullaris and cauda equina: Conus extends to the L1-2 level. Conus and cauda equina appear normal. Paraspinal and other soft tissues: Mild soft tissue thickening to the right and anterior to L2 consistent with extraosseous tumor. No  significant epidural tumor in the canal. Right nephrectomy. Disc levels: L1-2: Negative L2-3: Mild disc bulging and mild spinal stenosis. No significant epidural tumor identified. L3-4: Mild spinal stenosis.  Disc bulging and facet degeneration L4-5: Disc bulging and spurring. Mild facet degeneration. Mild subarticular stenosis bilaterally L5-S1: Mild disc degeneration without significant stenosis. IMPRESSION: Mild pathologic fracture of L2. Tumor infiltration L2 and L3 vertebral bodies. Mild paraspinous tumor extension outside of the L2 vertebral body anteriorly and to the right. No significant epidural tumor. Lumbar degenerative changes as above. Electronically Signed   By: Franchot Gallo M.D.   On: 03/28/2019 10:07   Ct Abdomen Pelvis W Contrast  Result Date: 03/11/2019 CLINICAL DATA:  Restaging uroepithelial cancer status post nephrectomy EXAM: CT CHEST, ABDOMEN, AND PELVIS WITH CONTRAST TECHNIQUE: Multidetector CT imaging of the chest, abdomen and pelvis was performed following the standard protocol during bolus administration of intravenous contrast. CONTRAST:  133mL OMNIPAQUE IOHEXOL 300 MG/ML  SOLN COMPARISON:  CT scan 01/05/2019 FINDINGS: CT CHEST FINDINGS Cardiovascular: The heart is normal in size. No pericardial effusion. Stable mild tortuosity and calcification of the thoracic aorta but no aneurysm or dissection. The branch vessels are patent. Mediastinum/Nodes: Stable small scattered mediastinal and hilar lymph nodes but no mass or overt adenopathy. The esophagus is grossly normal. Lungs/Pleura: Stable emphysematous changes and areas of pulmonary scarring. Again noted are numerous clustered pulmonary nodules in both lungs consistent with metastatic disease. Most of these nodules are relatively stable but some have enlarged and there are new nodules. 8 mm right middle lobe nodule on image number 87 previously measured 6.5 mm. Clustered irregular nodular density in the right lower lobe on image number  111 measures 18.5 mm and previously measured 14.5 mm. Bilobed nodular density in the left lower lobe on image number 137 measures 17 mm and is new. 5.5 mm right lower lobe nodule on image number 132 is new. Musculoskeletal: No chest wall mass is identified. Left supraclavicular lymph nodes are again demonstrated. The larger more anterior node on image number 5 measures 10.5 mm and is unchanged. Slightly more inferiorly there is an 8.5 mm nodule which previously measured 8 mm. No axillary adenopathy. Stable right-sided Port-A-Cath. There is a new central depression type fracture or large Smalls node involving L2. Could not exclude the possibility of a pathologic fracture. It appeared completely normal on the prior CT scan from March. MRI lumbar spine without and with contrast may be helpful for further evaluation CT ABDOMEN PELVIS FINDINGS Hepatobiliary: No focal hepatic lesions or intrahepatic biliary dilatation. Gallbladder appears normal.  No common bile duct dilatation. Pancreas: No mass, inflammation or ductal dilatation. Spleen: Normal size.  No focal lesions. Adrenals/Urinary Tract: The adrenal glands are normal in stable. Status post right nephro ureterectomy. No findings for residual tumor in the renal fossa. The left kidney is unremarkable. No worrisome renal lesions or collecting system abnormality on the delayed images. Stomach/Bowel: The stomach, duodenum, small bowel and colon are grossly normal. No inflammatory changes, mass lesions or obstructive findings. Advanced sigmoid colon diverticulosis but no findings for acute diverticulitis. The terminal ileum and appendix are normal. Vascular/Lymphatic: Stable appearing retroperitoneal lymphadenopathy. The dominant left para-aortic node on image number 77 measures 17 mm and previously measured 18 mm. More inferiorly near the iliac artery bifurcation an 11 mm left-sided retroperitoneal node on image number 90 previously measured 12 mm. Right-sided pelvic  adenopathy appears stable. 10 mm external iliac lymph node on image number 105 is unchanged. 13 mm right external iliac lymph node on image number 110 previously measured 11.5 mm. Soft tissue lesion in the region of the resected right ureter distally on image number 114 measures 13 x 9.5 mm and previously measured 11.5 x 10 mm. Adjacent 8 mm node on image number 113 is stable. Reproductive: The prostate gland and seminal vesicles are unremarkable. Penile prosthesis is noted. Other: Small inguinal hernias containing fat. No inguinal adenopathy. Musculoskeletal: No worrisome lytic or sclerotic bone lesions involving the pelvis. IMPRESSION: 1. Slight interval progression of pulmonary metastatic disease with some enlarging and some new pulmonary nodules. 2. Stable left supraclavicular lymph nodes. 3. Overall stable retroperitoneal and pelvic lymphadenopathy. 4. New compression fracture of L2. Suspicious for a pathologic fracture. MRI lumbar spine without and with contrast may be helpful for further evaluation. No spinal canal compromise. Electronically Signed   By: Marijo Sanes M.D.   On: 03/11/2019 17:03    ASSESSMENT: Recurrent stage IVa urothelial carcinoma with left supraclavicular lymph node metastasis.  PDL 1 0%  PLAN:    1.  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  Patient received his last dose of cisplatin and gemcitabine on May 27, 2018.  Because patient had previous nephrectomy, he was given a split dose of cisplatin.  CT scan results from March 11, 2019 reviewed independently with continued increase in size and number of pulmonary nodules.  MRI of his thoracic spine also revealed metastatic disease.  Continue with daily XRT.  Proceed with cycle 1, day 8 of cisplatin and gemcitabine today.  Return to clinic in 2 weeks for further evaluation and consideration of  cycle 2, day 1.   2.  Genetic testing: Negative. 3.  Kidney function: Patient's creatinine continues to be within normal limits. 4.  Cardiac disease: Continue follow-up with cardiology as indicated. 5.  Back pain: MRI results from March 27, 2019 reviewed independently and reported as above consistent with metastatic disease.  Continue daily XRT.  Continue hydrocodone as needed. 6.  Constipation: Continue current bowel regimen and have recommended adding MiraLAX or magnesium citrate OTC.  Patient expressed understanding and was in agreement with this plan. He also understands that He can call clinic at any time with any questions, concerns, or complaints.   Cancer Staging Urothelial carcinoma of bladder [facility]) Staging form: Urinary Bladder, AJCC 8th Edition - Clinical: Stage IVA [place], cN2, cM1a) - Signed by [name], MD on 04/09/2018   [name], MD   04/09/2019 7:58 AM

## 2019-04-05 ENCOUNTER — Other Ambulatory Visit: Payer: Medicare Other

## 2019-04-05 ENCOUNTER — Ambulatory Visit: Payer: Medicare Other

## 2019-04-06 ENCOUNTER — Other Ambulatory Visit: Payer: Self-pay | Admitting: *Deleted

## 2019-04-06 ENCOUNTER — Ambulatory Visit
Admission: RE | Admit: 2019-04-06 | Discharge: 2019-04-06 | Disposition: A | Discharge status: Home / Self Care | Payer: Medicare Other | Source: Ambulatory Visit | Attending: Radiation Oncology | Admitting: Radiation Oncology

## 2019-04-06 ENCOUNTER — Other Ambulatory Visit: Payer: Self-pay

## 2019-04-06 DIAGNOSIS — C679 Malignant neoplasm of bladder, unspecified: Secondary | ICD-10-CM

## 2019-04-06 DIAGNOSIS — Z510 Encounter for antineoplastic radiation therapy: Secondary | ICD-10-CM | POA: Diagnosis not present

## 2019-04-06 MED ORDER — ONDANSETRON HCL 8 MG PO TABS: 8.0000 mg | ORAL_TABLET | Freq: Three times a day (TID) | ORAL | 3 refills | Status: AC | PRN

## 2019-04-07 ENCOUNTER — Ambulatory Visit
Admission: RE | Admit: 2019-04-07 | Discharge: 2019-04-07 | Disposition: A | Discharge status: Home / Self Care | Payer: Medicare Other | Source: Ambulatory Visit | Attending: Radiation Oncology | Admitting: Radiation Oncology

## 2019-04-07 ENCOUNTER — Ambulatory Visit: Payer: Medicare Other | Admitting: Oncology

## 2019-04-07 ENCOUNTER — Other Ambulatory Visit: Payer: Self-pay

## 2019-04-07 DIAGNOSIS — Z510 Encounter for antineoplastic radiation therapy: Secondary | ICD-10-CM | POA: Diagnosis not present

## 2019-04-07 DIAGNOSIS — C681 Malignant neoplasm of paraurethral glands: Secondary | ICD-10-CM | POA: Insufficient documentation

## 2019-04-07 DIAGNOSIS — Z87891 Personal history of nicotine dependence: Secondary | ICD-10-CM | POA: Insufficient documentation

## 2019-04-07 DIAGNOSIS — C770 Secondary and unspecified malignant neoplasm of lymph nodes of head, face and neck: Secondary | ICD-10-CM | POA: Diagnosis not present

## 2019-04-07 DIAGNOSIS — C7951 Secondary malignant neoplasm of bone: Secondary | ICD-10-CM | POA: Diagnosis not present

## 2019-04-08 ENCOUNTER — Inpatient Hospital Stay: Payer: Medicare Other | Attending: Oncology

## 2019-04-08 ENCOUNTER — Inpatient Hospital Stay: Payer: Medicare Other

## 2019-04-08 ENCOUNTER — Other Ambulatory Visit: Payer: Self-pay

## 2019-04-08 ENCOUNTER — Encounter: Payer: Self-pay | Admitting: Oncology

## 2019-04-08 ENCOUNTER — Inpatient Hospital Stay (HOSPITAL_BASED_OUTPATIENT_CLINIC_OR_DEPARTMENT_OTHER): Payer: Medicare Other | Admitting: Oncology

## 2019-04-08 ENCOUNTER — Ambulatory Visit
Admission: RE | Admit: 2019-04-08 | Discharge: 2019-04-08 | Disposition: A | Discharge status: Home / Self Care | Payer: Medicare Other | Source: Ambulatory Visit | Attending: Radiation Oncology | Admitting: Radiation Oncology

## 2019-04-08 VITALS — BP 120/68 | HR 59 | Temp 96.3°F | Ht 72.0 in | Wt 189.6 lb

## 2019-04-08 DIAGNOSIS — C679 Malignant neoplasm of bladder, unspecified: Secondary | ICD-10-CM

## 2019-04-08 DIAGNOSIS — Z5111 Encounter for antineoplastic chemotherapy: Secondary | ICD-10-CM | POA: Insufficient documentation

## 2019-04-08 DIAGNOSIS — Z87891 Personal history of nicotine dependence: Secondary | ICD-10-CM

## 2019-04-08 DIAGNOSIS — M549 Dorsalgia, unspecified: Secondary | ICD-10-CM

## 2019-04-08 DIAGNOSIS — I519 Heart disease, unspecified: Secondary | ICD-10-CM | POA: Insufficient documentation

## 2019-04-08 DIAGNOSIS — R918 Other nonspecific abnormal finding of lung field: Secondary | ICD-10-CM | POA: Diagnosis not present

## 2019-04-08 DIAGNOSIS — Z905 Acquired absence of kidney: Secondary | ICD-10-CM

## 2019-04-08 DIAGNOSIS — C770 Secondary and unspecified malignant neoplasm of lymph nodes of head, face and neck: Secondary | ICD-10-CM

## 2019-04-08 DIAGNOSIS — K5900 Constipation, unspecified: Secondary | ICD-10-CM | POA: Diagnosis not present

## 2019-04-08 DIAGNOSIS — C7951 Secondary malignant neoplasm of bone: Secondary | ICD-10-CM

## 2019-04-08 DIAGNOSIS — Z803 Family history of malignant neoplasm of breast: Secondary | ICD-10-CM

## 2019-04-08 DIAGNOSIS — Z95828 Presence of other vascular implants and grafts: Secondary | ICD-10-CM

## 2019-04-08 DIAGNOSIS — Z510 Encounter for antineoplastic radiation therapy: Secondary | ICD-10-CM | POA: Diagnosis not present

## 2019-04-08 DIAGNOSIS — G4700 Insomnia, unspecified: Secondary | ICD-10-CM

## 2019-04-08 LAB — COMPREHENSIVE METABOLIC PANEL
ALT: 16 U/L (ref 0–44)
AST: 14 U/L — ABNORMAL LOW (ref 15–41)
Albumin: 3.8 g/dL (ref 3.5–5.0)
Alkaline Phosphatase: 96 U/L (ref 38–126)
Anion gap: 7 (ref 5–15)
BUN: 15 mg/dL (ref 8–23)
CO2: 24 mmol/L (ref 22–32)
Calcium: 8.6 mg/dL — ABNORMAL LOW (ref 8.9–10.3)
Chloride: 105 mmol/L (ref 98–111)
Creatinine, Ser: 1.08 mg/dL (ref 0.61–1.24)
GFR calc Af Amer: >60 mL/min (ref >60)
GFR calc non Af Amer: >60 mL/min (ref >60)
Glucose, Bld: 135 mg/dL — ABNORMAL HIGH (ref 70–99)
Potassium: 3.9 mmol/L (ref 3.5–5.1)
Sodium: 136 mmol/L (ref 135–145)
Total Bilirubin: 0.6 mg/dL (ref 0.3–1.2)
Total Protein: 7.1 g/dL (ref 6.5–8.1)

## 2019-04-08 LAB — CBC WITH DIFFERENTIAL/PLATELET
Abs Immature Granulocytes: 0.01 10*3/uL (ref 0.00–0.07)
Basophils Absolute: 0 10*3/uL (ref 0.0–0.1)
Basophils Relative: 1 %
Eosinophils Absolute: 0.1 10*3/uL (ref 0.0–0.5)
Eosinophils Relative: 3 %
HCT: 35.2 % — ABNORMAL LOW (ref 39.0–52.0)
Hemoglobin: 11.5 g/dL — ABNORMAL LOW (ref 13.0–17.0)
Immature Granulocytes: 0 %
Lymphocytes Relative: 25 %
Lymphs Abs: 1.4 10*3/uL (ref 0.7–4.0)
MCH: 31.2 pg (ref 26.0–34.0)
MCHC: 32.7 g/dL (ref 30.0–36.0)
MCV: 95.4 fL (ref 80.0–100.0)
Monocytes Absolute: 0.5 10*3/uL (ref 0.1–1.0)
Monocytes Relative: 10 %
Neutro Abs: 3.3 10*3/uL (ref 1.7–7.7)
Neutrophils Relative %: 61 %
Platelets: 175 10*3/uL (ref 150–400)
RBC: 3.69 MIL/uL — ABNORMAL LOW (ref 4.22–5.81)
RDW: 14 % (ref 11.5–15.5)
WBC: 5.4 10*3/uL (ref 4.0–10.5)
nRBC: 0 % (ref 0.0–0.2)

## 2019-04-08 MED ORDER — SODIUM CHLORIDE 0.9 % IV SOLN
Freq: Once | INTRAVENOUS | Status: AC
  Administered 2019-04-08: 10:00:00 via INTRAVENOUS
  Filled 2019-04-08: qty 250

## 2019-04-08 MED ORDER — PALONOSETRON HCL INJECTION 0.25 MG/5ML
0.2500 mg | Freq: Once | INTRAVENOUS | Status: AC
  Administered 2019-04-08: 0.25 mg via INTRAVENOUS

## 2019-04-08 MED ORDER — SODIUM CHLORIDE 0.9 % IV SOLN
35.0000 mg/m2 | Freq: Once | INTRAVENOUS | Status: AC
  Administered 2019-04-08: 13:00:00 74 mg via INTRAVENOUS
  Filled 2019-04-08: qty 74

## 2019-04-08 MED ORDER — HEPARIN SOD (PORK) LOCK FLUSH 100 UNIT/ML IV SOLN
500.0000 [IU] | Freq: Once | INTRAVENOUS | Status: AC | PRN
  Administered 2019-04-08: 15:00:00 500 [IU]

## 2019-04-08 MED ORDER — SODIUM CHLORIDE 0.9 % IV SOLN
1600.0000 mg | Freq: Once | INTRAVENOUS | Status: AC
  Administered 2019-04-08: 1600 mg via INTRAVENOUS
  Filled 2019-04-08: qty 26.3

## 2019-04-08 MED ORDER — SODIUM CHLORIDE 0.9% FLUSH
10.0000 mL | Freq: Once | INTRAVENOUS | Status: AC
  Administered 2019-04-08: 10 mL via INTRAVENOUS
  Filled 2019-04-08: qty 10

## 2019-04-08 MED ORDER — POTASSIUM CHLORIDE 2 MEQ/ML IV SOLN
Freq: Once | INTRAVENOUS | Status: AC
  Administered 2019-04-08: 10:00:00 via INTRAVENOUS
  Filled 2019-04-08: qty 1000

## 2019-04-08 MED ORDER — SODIUM CHLORIDE 0.9 % IV SOLN
Freq: Once | INTRAVENOUS | Status: AC
  Administered 2019-04-08: 12:00:00 via INTRAVENOUS
  Filled 2019-04-08: qty 5

## 2019-04-08 NOTE — Progress Notes (Signed)
Patient stated that he continues to have back pain.

## 2019-04-12 ENCOUNTER — Ambulatory Visit
Admission: RE | Admit: 2019-04-12 | Discharge: 2019-04-12 | Disposition: A | Discharge status: Home / Self Care | Payer: Medicare Other | Source: Ambulatory Visit | Attending: Radiation Oncology | Admitting: Radiation Oncology

## 2019-04-12 ENCOUNTER — Other Ambulatory Visit: Payer: Self-pay

## 2019-04-12 DIAGNOSIS — Z510 Encounter for antineoplastic radiation therapy: Secondary | ICD-10-CM | POA: Diagnosis not present

## 2019-04-13 ENCOUNTER — Other Ambulatory Visit: Payer: Self-pay

## 2019-04-13 ENCOUNTER — Ambulatory Visit
Admission: RE | Admit: 2019-04-13 | Discharge: 2019-04-13 | Disposition: A | Discharge status: Home / Self Care | Payer: Medicare Other | Source: Ambulatory Visit | Attending: Radiation Oncology | Admitting: Radiation Oncology

## 2019-04-13 DIAGNOSIS — Z510 Encounter for antineoplastic radiation therapy: Secondary | ICD-10-CM | POA: Diagnosis not present

## 2019-04-14 ENCOUNTER — Other Ambulatory Visit: Payer: Self-pay | Admitting: *Deleted

## 2019-04-14 ENCOUNTER — Ambulatory Visit
Admission: RE | Admit: 2019-04-14 | Discharge: 2019-04-14 | Disposition: A | Discharge status: Home / Self Care | Payer: Medicare Other | Source: Ambulatory Visit | Attending: Radiation Oncology | Admitting: Radiation Oncology

## 2019-04-14 ENCOUNTER — Other Ambulatory Visit: Payer: Self-pay

## 2019-04-14 DIAGNOSIS — Z510 Encounter for antineoplastic radiation therapy: Secondary | ICD-10-CM | POA: Diagnosis not present

## 2019-04-14 MED ORDER — SUCRALFATE 1 G PO TABS: 1.0000 g | ORAL_TABLET | Freq: Three times a day (TID) | ORAL | 3 refills | Status: AC

## 2019-04-15 ENCOUNTER — Ambulatory Visit
Admission: RE | Admit: 2019-04-15 | Discharge: 2019-04-15 | Disposition: A | Discharge status: Home / Self Care | Payer: Medicare Other | Source: Ambulatory Visit | Attending: Radiation Oncology | Admitting: Radiation Oncology

## 2019-04-15 ENCOUNTER — Other Ambulatory Visit: Payer: Self-pay

## 2019-04-15 DIAGNOSIS — Z510 Encounter for antineoplastic radiation therapy: Secondary | ICD-10-CM | POA: Diagnosis not present

## 2019-04-16 ENCOUNTER — Ambulatory Visit
Admission: RE | Admit: 2019-04-16 | Discharge: 2019-04-16 | Disposition: A | Discharge status: Home / Self Care | Payer: Medicare Other | Source: Ambulatory Visit | Attending: Radiation Oncology | Admitting: Radiation Oncology

## 2019-04-16 ENCOUNTER — Other Ambulatory Visit: Payer: Self-pay

## 2019-04-16 DIAGNOSIS — Z510 Encounter for antineoplastic radiation therapy: Secondary | ICD-10-CM | POA: Diagnosis not present

## 2019-04-19 ENCOUNTER — Other Ambulatory Visit: Payer: Self-pay

## 2019-04-19 ENCOUNTER — Ambulatory Visit
Admission: RE | Admit: 2019-04-19 | Discharge: 2019-04-19 | Disposition: A | Discharge status: Home / Self Care | Payer: Medicare Other | Source: Ambulatory Visit | Attending: Radiation Oncology | Admitting: Radiation Oncology

## 2019-04-19 DIAGNOSIS — Z510 Encounter for antineoplastic radiation therapy: Secondary | ICD-10-CM | POA: Diagnosis not present

## 2019-04-20 ENCOUNTER — Other Ambulatory Visit: Payer: Self-pay

## 2019-04-20 ENCOUNTER — Ambulatory Visit
Admission: RE | Admit: 2019-04-20 | Discharge: 2019-04-20 | Disposition: A | Discharge status: Home / Self Care | Payer: Medicare Other | Source: Ambulatory Visit | Attending: Radiation Oncology | Admitting: Radiation Oncology

## 2019-04-20 DIAGNOSIS — Z510 Encounter for antineoplastic radiation therapy: Secondary | ICD-10-CM | POA: Diagnosis not present

## 2019-04-21 ENCOUNTER — Ambulatory Visit: Payer: Medicare Other

## 2019-04-21 ENCOUNTER — Other Ambulatory Visit: Payer: Self-pay

## 2019-04-22 ENCOUNTER — Other Ambulatory Visit: Payer: Self-pay | Admitting: *Deleted

## 2019-04-22 ENCOUNTER — Inpatient Hospital Stay: Payer: Medicare Other

## 2019-04-22 ENCOUNTER — Inpatient Hospital Stay (HOSPITAL_BASED_OUTPATIENT_CLINIC_OR_DEPARTMENT_OTHER): Payer: Medicare Other | Admitting: Oncology

## 2019-04-22 ENCOUNTER — Encounter: Payer: Self-pay | Admitting: Oncology

## 2019-04-22 ENCOUNTER — Other Ambulatory Visit: Payer: Self-pay

## 2019-04-22 VITALS — BP 142/81 | HR 62 | Temp 98.2°F | Ht 72.0 in | Wt 185.6 lb

## 2019-04-22 DIAGNOSIS — K5900 Constipation, unspecified: Secondary | ICD-10-CM

## 2019-04-22 DIAGNOSIS — C7951 Secondary malignant neoplasm of bone: Secondary | ICD-10-CM | POA: Diagnosis not present

## 2019-04-22 DIAGNOSIS — Z87891 Personal history of nicotine dependence: Secondary | ICD-10-CM

## 2019-04-22 DIAGNOSIS — R918 Other nonspecific abnormal finding of lung field: Secondary | ICD-10-CM | POA: Diagnosis not present

## 2019-04-22 DIAGNOSIS — C679 Malignant neoplasm of bladder, unspecified: Secondary | ICD-10-CM | POA: Diagnosis not present

## 2019-04-22 DIAGNOSIS — C770 Secondary and unspecified malignant neoplasm of lymph nodes of head, face and neck: Secondary | ICD-10-CM

## 2019-04-22 DIAGNOSIS — Z95828 Presence of other vascular implants and grafts: Secondary | ICD-10-CM

## 2019-04-22 DIAGNOSIS — Z5111 Encounter for antineoplastic chemotherapy: Secondary | ICD-10-CM | POA: Diagnosis not present

## 2019-04-22 DIAGNOSIS — Z803 Family history of malignant neoplasm of breast: Secondary | ICD-10-CM

## 2019-04-22 DIAGNOSIS — M549 Dorsalgia, unspecified: Secondary | ICD-10-CM

## 2019-04-22 DIAGNOSIS — I519 Heart disease, unspecified: Secondary | ICD-10-CM

## 2019-04-22 LAB — COMPREHENSIVE METABOLIC PANEL
ALT: 14 U/L (ref 0–44)
AST: 15 U/L (ref 15–41)
Albumin: 3.6 g/dL (ref 3.5–5.0)
Alkaline Phosphatase: 77 U/L (ref 38–126)
Anion gap: 7 (ref 5–15)
BUN: 15 mg/dL (ref 8–23)
CO2: 23 mmol/L (ref 22–32)
Calcium: 8.7 mg/dL — ABNORMAL LOW (ref 8.9–10.3)
Chloride: 107 mmol/L (ref 98–111)
Creatinine, Ser: 1.04 mg/dL (ref 0.61–1.24)
GFR calc Af Amer: >60 mL/min (ref >60)
GFR calc non Af Amer: >60 mL/min (ref >60)
Glucose, Bld: 102 mg/dL — ABNORMAL HIGH (ref 70–99)
Potassium: 4 mmol/L (ref 3.5–5.1)
Sodium: 137 mmol/L (ref 135–145)
Total Bilirubin: 0.4 mg/dL (ref 0.3–1.2)
Total Protein: 6.8 g/dL (ref 6.5–8.1)

## 2019-04-22 LAB — CBC WITH DIFFERENTIAL/PLATELET
Abs Immature Granulocytes: 0.01 10*3/uL (ref 0.00–0.07)
Basophils Absolute: 0.1 10*3/uL (ref 0.0–0.1)
Basophils Relative: 1 %
Eosinophils Absolute: 0.3 10*3/uL (ref 0.0–0.5)
Eosinophils Relative: 4 %
HCT: 34.6 % — ABNORMAL LOW (ref 39.0–52.0)
Hemoglobin: 11.4 g/dL — ABNORMAL LOW (ref 13.0–17.0)
Immature Granulocytes: 0 %
Lymphocytes Relative: 14 %
Lymphs Abs: 0.9 10*3/uL (ref 0.7–4.0)
MCH: 31.1 pg (ref 26.0–34.0)
MCHC: 32.9 g/dL (ref 30.0–36.0)
MCV: 94.3 fL (ref 80.0–100.0)
Monocytes Absolute: 0.7 10*3/uL (ref 0.1–1.0)
Monocytes Relative: 10 %
Neutro Abs: 4.4 10*3/uL (ref 1.7–7.7)
Neutrophils Relative %: 71 %
Platelets: 130 10*3/uL — ABNORMAL LOW (ref 150–400)
RBC: 3.67 MIL/uL — ABNORMAL LOW (ref 4.22–5.81)
RDW: 14.7 % (ref 11.5–15.5)
WBC: 6.3 10*3/uL (ref 4.0–10.5)
nRBC: 0 % (ref 0.0–0.2)

## 2019-04-22 MED ORDER — FENTANYL 25 MCG/HR TD PT72: 1.0000 | MEDICATED_PATCH | TRANSDERMAL | 0 refills | Status: DC

## 2019-04-22 MED ORDER — SODIUM CHLORIDE 0.9% FLUSH
10.0000 mL | Freq: Once | INTRAVENOUS | Status: AC
  Administered 2019-04-22: 08:00:00 10 mL via INTRAVENOUS
  Filled 2019-04-22: qty 10

## 2019-04-22 MED ORDER — HEPARIN SOD (PORK) LOCK FLUSH 100 UNIT/ML IV SOLN
INTRAVENOUS | Status: AC
  Filled 2019-04-22: qty 5

## 2019-04-22 NOTE — Progress Notes (Signed)
Patient stated that for the past 3-4 weeks he had been having terrible back and abdominal pain. Patient stated that he takes Miralax and Milk of Magnesia and he still suffers from constipation. Patient would like to know if he could take at least take 2 weeks off so his abdomen to feel better. Patient has a lot of questions that would like to be answered.

## 2019-04-23 ENCOUNTER — Encounter: Payer: Self-pay | Admitting: *Deleted

## 2019-04-23 NOTE — Progress Notes (Signed)
[place]  [phone] [phone]  ID: David Rich OB: 11/20/1945  MR#: [id number]  RFF#:[id number]  Patient Care Team: Baxter Hire, MD as PCP - General (Internal Medicine) [name], MD as PCP - Cardiology (Cardiology) [name], MD as Consulting Physician (Oncology)  CHIEF COMPLAINT: Recurrent stage IVa urothelial carcinoma with left supraclavicular lymph node metastasis.  INTERVAL HISTORY: Patient returns to clinic today for further evaluation and consideration of cycle 2, day 8 of cisplatin and gemcitabine.  His back pain is evident, but mildly improved.  He reports increasing weakness and fatigue as well as nausea this past week.  He has increased constipation. He has no neurologic complaints.  He denies any recent fevers or illnesses.  He has a fair appetite and denies weight loss.  He denies any chest pain, shortness of breath, cough, or hemoptysis. He has no urinary complaints.  Patient offers no further specific complaints today.  REVIEW OF SYSTEMS:   Review of Systems  Constitutional: Positive for malaise/fatigue. Negative for fever and weight loss.  Respiratory: Negative.  Negative for cough and shortness of breath.   Cardiovascular: Negative.  Negative for chest pain and leg swelling.  Gastrointestinal: Positive for abdominal pain, constipation and nausea. Negative for diarrhea and vomiting.  Genitourinary: Negative.  Negative for dysuria and hematuria.  Musculoskeletal: Positive for back pain.  Skin: Negative.  Negative for rash.  Neurological: Positive for weakness. Negative for sensory change, focal weakness and headaches.  Psychiatric/Behavioral: Negative.  The patient is not nervous/anxious.     As per HPI. Otherwise, a complete review of systems is negative.  PAST MEDICAL HISTORY: Past Medical History:  Diagnosis Date  . Cancer [facility])    prostate  . Hyperlipidemia   . Hypertension   . Renal  disorder     PAST SURGICAL HISTORY: Past Surgical History:  Procedure Laterality Date  . CORONARY STENT INTERVENTION N/A 09/28/2018   Procedure: CORONARY STENT INTERVENTION;  Surgeon: [name], MD;  Location: [place] [facility];  Service: Cardiovascular;  Laterality: N/A;  . CORONARY STENT PLACEMENT    . IR FLUORO GUIDE CV LINE RIGHT  04/03/2018  . kidney removed Left 2013  . LEFT HEART CATH AND CORONARY ANGIOGRAPHY N/A 09/28/2018   Procedure: LEFT HEART CATH AND CORONARY ANGIOGRAPHY;  Surgeon: [name], MD;  Location: [place] [facility];  Service: Cardiovascular;  Laterality: N/A;    FAMILY HISTORY: Family History  Problem Relation Age of Onset  . Coronary artery disease Mother   . Anemia Mother   . Kidney failure Mother   . Subarachnoid hemorrhage Father   . Breast cancer Sister 45  . Hypertension Brother     ADVANCED DIRECTIVES (Y/N):  N  HEALTH MAINTENANCE: Social History   Tobacco Use  . Smoking status: Former Smoker    Packs/day: 1.00    Years: 55.00    Pack years: 55.00    Types: Cigarettes    Quit date: 03/20/2012    Years since quitting: 7.0  . Smokeless tobacco: Never Used  Substance Use Topics  . Alcohol use: Yes    Comment: Social  . Drug use: No     Colonoscopy:  PAP:  Bone density:  Lipid panel:  Allergies  Allergen Reactions  . Clopidogrel Rash and Hives  . Rosuvastatin Calcium Anaphylaxis  . Etodolac Nausea And Vomiting and Other (See Comments)    Bad dreams Bad dreams   . Hydrocodone Other (See Comments)  Bad dreams Bad dreams   . Other   . Statins   . Meloxicam Rash    Bad dreams Bad dreams   . Pravastatin Sodium Rash    Current Outpatient Medications  Medication Sig Dispense Refill  . acetaminophen (TYLENOL) 325 MG tablet Take 325 mg by mouth every 4 (four) hours as needed.     [name] amLODipine (NORVASC) 10 MG tablet TAKE 1 TABLET BY MOUTH EVERY DAY    . aspirin EC 81 MG tablet Take by mouth.    .  carvedilol (COREG) 6.25 MG tablet Take 1 tablet (6.25 mg total) by mouth 2 (two) times daily. 60 tablet 6  . clopidogrel (PLAVIX) 75 MG tablet Take 75 mg by mouth daily.    [name] ezetimibe (ZETIA) 10 MG tablet TAKE 1 TABLET BY MOUTH DAILY 90 tablet 0  . famotidine (PEPCID) 20 MG tablet Take 20 mg by mouth 2 (two) times daily.     . fentaNYL (DURAGESIC) 25 MCG/HR Place 1 patch onto the skin every 3 (three) days. 10 patch 0  . Garlic 834 MG TABS Take by mouth.     [name] HYDROcodone-acetaminophen (NORCO/VICODIN) 5-325 MG tablet Take 1 tablet by mouth every 6 (six) hours as needed for moderate pain. 120 tablet 0  . lidocaine-prilocaine (EMLA) cream Apply to affected area once 30 g 3  . Multiple Vitamin (MULTI-VITAMINS) TABS Take by mouth.    . nitroGLYCERIN (NITROSTAT) 0.4 MG SL tablet Place under the tongue.    . Omega-3 Fatty Acids (FISH OIL OMEGA-3) 1000 MG CAPS Take by mouth daily.    . ondansetron (ZOFRAN) 8 MG tablet Take 1 tablet (8 mg total) by mouth every 8 (eight) hours as needed for nausea or vomiting. 30 tablet 3  . sucralfate (CARAFATE) 1 g tablet Take 1 tablet (1 g total) by mouth 3 (three) times daily. 90 tablet 3   No current facility-administered medications for this visit.    Facility-Administered Medications Ordered in Other Visits  Medication Dose Route Frequency Provider Last Rate Last Dose  . sodium chloride flush (NS) 0.9 % injection 10 mL  10 mL Intravenous PRN [name], MD   10 mL at 10/05/18 1026    OBJECTIVE: Vitals:   04/22/19 0831  BP: (!) 142/81  Pulse: 62  Temp: 98.2 F (36.8 C)     Body mass index is 25.17 kg/m.    ECOG FS:0 - Asymptomatic   General: Well-developed, well-nourished, no acute distress. Eyes: Pink conjunctiva, anicteric sclera. HEENT: Normocephalic, moist mucous membranes. Lungs: Clear to auscultation bilaterally. Heart: Regular rate and rhythm. No rubs, murmurs, or gallops. Abdomen: Soft, nontender, nondistended. No organomegaly noted,  normoactive bowel sounds. Musculoskeletal: No edema, cyanosis, or clubbing. Neuro: Alert, answering all questions appropriately. Cranial nerves grossly intact. Skin: No rashes or petechiae noted. Psych: Normal affect.  LAB RESULTS:  Lab Results  Component Value Date   NA 137 04/22/2019   K 4.0 04/22/2019   CL 107 04/22/2019   CO2 23 04/22/2019   GLUCOSE 102 (H) 04/22/2019   BUN 15 04/22/2019   CREATININE 1.04 04/22/2019   CALCIUM 8.7 (L) 04/22/2019   PROT 6.8 04/22/2019   ALBUMIN 3.6 04/22/2019   AST 15 04/22/2019   ALT 14 04/22/2019   ALKPHOS 77 04/22/2019   BILITOT 0.4 04/22/2019   GFRNONAA >60 04/22/2019   GFRAA >60 04/22/2019    Lab Results  Component Value Date   WBC 6.3 04/22/2019   NEUTROABS 4.4 04/22/2019   HGB  11.4 (L) 04/22/2019   HCT 34.6 (L) 04/22/2019   MCV 94.3 04/22/2019   PLT 130 (L) 04/22/2019     STUDIES: Mr Lumbar Spine Wo Contrast  Result Date: 03/28/2019 CLINICAL DATA:  Nontraumatic compression fracture lumbar spine. History of bladder cancer metastatic. EXAM: MRI LUMBAR SPINE WITHOUT CONTRAST TECHNIQUE: Multiplanar, multisequence MR imaging of the lumbar spine was performed. No intravenous contrast was administered. COMPARISON:  CT chest abdomen pelvis 03/11/2019 FINDINGS: Segmentation:  Normal Alignment:  Normal Vertebrae: Mild pathologic fracture of L2. Diffuse infiltration of bone marrow L2 vertebral body consistent with metastatic disease. Mild tumor infiltration L3 vertebral body anteriorly. Conus medullaris and cauda equina: Conus extends to the L1-2 level. Conus and cauda equina appear normal. Paraspinal and other soft tissues: Mild soft tissue thickening to the right and anterior to L2 consistent with extraosseous tumor. No significant epidural tumor in the canal. Right nephrectomy. Disc levels: L1-2: Negative L2-3: Mild disc bulging and mild spinal stenosis. No significant epidural tumor identified. L3-4: Mild spinal stenosis.  Disc bulging and  facet degeneration L4-5: Disc bulging and spurring. Mild facet degeneration. Mild subarticular stenosis bilaterally L5-S1: Mild disc degeneration without significant stenosis. IMPRESSION: Mild pathologic fracture of L2. Tumor infiltration L2 and L3 vertebral bodies. Mild paraspinous tumor extension outside of the L2 vertebral body anteriorly and to the right. No significant epidural tumor. Lumbar degenerative changes as above. Electronically Signed   By: Franchot Gallo M.D.   On: 03/28/2019 10:07    ASSESSMENT: Recurrent stage IVa urothelial carcinoma with left supraclavicular lymph node metastasis.  PDL 1 0%  PLAN:    1.  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 CT scan results from March 11, 2019 reviewed independently with continued increase in size and number of pulmonary nodules.  MRI of his thoracic spine also revealed metastatic disease.  Patient has now completed XRT to his back.  He wishes to delay cycle 2, day 1 of treatment, but has declined IV fluids.  Return to clinic in 1 week for further evaluation and reconsideration of cycle 2, day 1.  2.  Genetic testing: Negative. 3.  Kidney function: Patient's creatinine continues to be within normal limits. 4.  Cardiac disease: Continue follow-up with cardiology as indicated. 5.  Back pain: MRI results from March 27, 2019 reviewed independently consistent with metastatic disease.  Patient has now completed XRT, but still has residual pain.  He was given a prescription for fentanyl patch and refill of his hydrocodone today. 6.  Constipation: Continue current bowel regimen and have recommended adding MiraLAX or magnesium citrate OTC.  Patient expressed understanding and was in agreement with this plan. He also understands that He can call clinic at any time with any questions, concerns, or complaints.   Cancer  Staging Urothelial carcinoma of bladder [facility]) Staging form: Urinary Bladder, AJCC 8th Edition - Clinical: Stage IVA [place] Nancy, cN2, cM1a) - Signed by [name], MD on 04/09/2018   [name], MD   04/23/2019 6:26 AM

## 2019-04-25 ENCOUNTER — Other Ambulatory Visit: Payer: Self-pay | Admitting: Oncology

## 2019-04-25 NOTE — Progress Notes (Signed)
[place]  [phone] [phone]) [phone]  ID: David Rich OB: 1946-01-20  MR#: [id number]  OJJ#:[id number]  Patient Care Team: Baxter Hire, MD as PCP - General (Internal Medicine) [name], MD as PCP - Cardiology (Cardiology) [name], MD as Consulting Physician (Oncology)  CHIEF COMPLAINT: Recurrent stage IVa urothelial carcinoma with left supraclavicular lymph node metastasis.  INTERVAL HISTORY: Patient returns to clinic today for further evaluation and reconsideration of cycle 2, day 8 of cisplatin and gemcitabine.  His back pain has significantly improved, but is still evident.  His weakness and fatigue have also significantly improved.  He has no neurologic complaints.  He denies any recent fevers or illnesses.  He has a fair appetite and denies weight loss.  He denies any chest pain, shortness of breath, cough, or hemoptysis.  He denies any further nausea and has no vomiting or diarrhea.  His constipation is better controlled.  He has no urinary complaints.  Patient offers no further specific complaints today.  REVIEW OF SYSTEMS:   Review of Systems  Constitutional: Negative.  Negative for fever, malaise/fatigue and weight loss.  Respiratory: Negative.  Negative for cough and shortness of breath.   Cardiovascular: Negative.  Negative for chest pain and leg swelling.  Gastrointestinal: Negative.  Negative for abdominal pain, constipation, diarrhea, nausea and vomiting.  Genitourinary: Negative.  Negative for dysuria and hematuria.  Musculoskeletal: Positive for back pain.  Skin: Negative.  Negative for rash.  Neurological: Negative.  Negative for sensory change, focal weakness, weakness and headaches.  Psychiatric/Behavioral: Negative.  The patient is not nervous/anxious.     As per HPI. Otherwise, a complete review of systems is negative.  PAST MEDICAL HISTORY: Past Medical History:  Diagnosis Date  . Cancer [facility])     prostate  . Hyperlipidemia   . Hypertension   . Renal disorder     PAST SURGICAL HISTORY: Past Surgical History:  Procedure Laterality Date  . CORONARY STENT INTERVENTION N/A 09/28/2018   Procedure: CORONARY STENT INTERVENTION;  Surgeon: [name], MD;  Location: [place] [facility];  Service: Cardiovascular;  Laterality: N/A;  . CORONARY STENT PLACEMENT    . IR FLUORO GUIDE CV LINE RIGHT  04/03/2018  . kidney removed Left 2013  . LEFT HEART CATH AND CORONARY ANGIOGRAPHY N/A 09/28/2018   Procedure: LEFT HEART CATH AND CORONARY ANGIOGRAPHY;  Surgeon: [name], MD;  Location: [place] [facility];  Service: Cardiovascular;  Laterality: N/A;    FAMILY HISTORY: Family History  Problem Relation Age of Onset  . Coronary artery disease Mother   . Anemia Mother   . Kidney failure Mother   . Subarachnoid hemorrhage Father   . Breast cancer Sister 25  . Hypertension Brother     ADVANCED DIRECTIVES (Y/N):  N  HEALTH MAINTENANCE: Social History   Tobacco Use  . Smoking status: Former Smoker    Packs/day: 1.00    Years: 55.00    Pack years: 55.00    Types: Cigarettes    Quit date: 03/20/2012    Years since quitting: 7.1  . Smokeless tobacco: Never Used  Substance Use Topics  . Alcohol use: Yes    Comment: Social  . Drug use: No     Colonoscopy:  PAP:  Bone density:  Lipid panel:  Allergies  Allergen Reactions  . Clopidogrel Rash and Hives  . Rosuvastatin Calcium Anaphylaxis  . Etodolac Nausea And Vomiting and Other (See Comments)    Bad  dreams Bad dreams   . Hydrocodone Other (See Comments)    Bad dreams Bad dreams   . Other   . Statins   . Meloxicam Rash    Bad dreams Bad dreams   . Pravastatin Sodium Rash    Current Outpatient Medications  Medication Sig Dispense Refill  . acetaminophen (TYLENOL) 325 MG tablet Take 325 mg by mouth every 4 (four) hours as needed.     [name] amLODipine (NORVASC) 10 MG tablet TAKE 1 TABLET BY MOUTH EVERY  DAY    . aspirin EC 81 MG tablet Take by mouth.    . carvedilol (COREG) 6.25 MG tablet Take 1 tablet (6.25 mg total) by mouth 2 (two) times daily. 60 tablet 6  . clopidogrel (PLAVIX) 75 MG tablet Take 75 mg by mouth daily.    [name] ezetimibe (ZETIA) 10 MG tablet TAKE 1 TABLET BY MOUTH DAILY 90 tablet 0  . famotidine (PEPCID) 20 MG tablet Take 20 mg by mouth 2 (two) times daily.     . fentaNYL (DURAGESIC) 25 MCG/HR Place 1 patch onto the skin every 3 (three) days. 10 patch 0  . Garlic 623 MG TABS Take by mouth.     [name] HYDROcodone-acetaminophen (NORCO/VICODIN) 5-325 MG tablet Take 1 tablet by mouth every 6 (six) hours as needed for moderate pain. 120 tablet 0  . lidocaine-prilocaine (EMLA) cream Apply to affected area once 30 g 3  . Multiple Vitamin (MULTI-VITAMINS) TABS Take by mouth.    . nitroGLYCERIN (NITROSTAT) 0.4 MG SL tablet Place under the tongue.    . Omega-3 Fatty Acids (FISH OIL OMEGA-3) 1000 MG CAPS Take by mouth daily.    . ondansetron (ZOFRAN) 8 MG tablet Take 1 tablet (8 mg total) by mouth every 8 (eight) hours as needed for nausea or vomiting. 30 tablet 3  . prochlorperazine (COMPAZINE) 10 MG tablet Take 1 tablet (10 mg total) by mouth every 6 (six) hours as needed for nausea or vomiting. 30 tablet 0  . sucralfate (CARAFATE) 1 g tablet Take 1 tablet (1 g total) by mouth 3 (three) times daily. 90 tablet 3   No current facility-administered medications for this visit.    Facility-Administered Medications Ordered in Other Visits  Medication Dose Route Frequency Provider Last Rate Last Dose  . sodium chloride flush (NS) 0.9 % injection 10 mL  10 mL Intravenous PRN [name], MD   10 mL at 10/05/18 1026    OBJECTIVE: Vitals:   04/29/19 0827  BP: 125/76  Pulse: 87  Temp: 98 F (36.7 C)     Body mass index is 25.09 kg/m.    ECOG FS:0 - Asymptomatic   General: Well-developed, well-nourished, no acute distress. Eyes: Pink conjunctiva, anicteric sclera. HEENT:  Normocephalic, moist mucous membranes. Lungs: Clear to auscultation bilaterally. Heart: Regular rate and rhythm. No rubs, murmurs, or gallops. Abdomen: Soft, nontender, nondistended. No organomegaly noted, normoactive bowel sounds. Musculoskeletal: No edema, cyanosis, or clubbing. Neuro: Alert, answering all questions appropriately. Cranial nerves grossly intact. Skin: No rashes or petechiae noted. Psych: Normal affect.  LAB RESULTS:  Lab Results  Component Value Date   NA 137 04/29/2019   K 3.6 04/29/2019   CL 106 04/29/2019   CO2 23 04/29/2019   GLUCOSE 128 (H) 04/29/2019   BUN 13 04/29/2019   CREATININE 1.18 04/29/2019   CALCIUM 8.8 (L) 04/29/2019   PROT 7.1 04/29/2019   ALBUMIN 3.9 04/29/2019   AST 16 04/29/2019   ALT 14 04/29/2019  ALKPHOS 84 04/29/2019   BILITOT 0.5 04/29/2019   GFRNONAA >60 04/29/2019   GFRAA >60 04/29/2019    Lab Results  Component Value Date   WBC 4.8 04/29/2019   NEUTROABS 2.4 04/29/2019   HGB 11.6 (L) 04/29/2019   HCT 35.2 (L) 04/29/2019   MCV 94.4 04/29/2019   PLT 270 04/29/2019     STUDIES: No results found.  ASSESSMENT: Recurrent stage IVa urothelial carcinoma with left supraclavicular lymph node metastasis.  PDL 1 0%  PLAN:    1.  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 CT scan results from March 11, 2019 reviewed independently with continued increase in size and number of pulmonary nodules.  MRI of his thoracic spine also revealed metastatic disease.  Patient has now completed XRT to his back.  Proceed with cycle 2, day 1 of cisplatin and gemcitabine today.  Patient will also require Zometa given his bony metastasis.  Return to clinic in 1 week for further evaluation and consideration of cycle 2, day 8.  2.  Genetic testing: Negative. 3.  Kidney function: Patient's creatinine continues to be  within normal limits. 4.  Cardiac disease: Continue follow-up with cardiology as indicated. 5.  Back pain: MRI results from March 27, 2019 reviewed independently consistent with metastatic disease.  Patient has now completed XRT, but still has residual pain.  Continue fentanyl patch and hydrocodone as prescribed.  If no improvement by next week, will increase dosage. 6.  Constipation: Continue current bowel regimen and have recommended adding MiraLAX or magnesium citrate OTC. 7.  Bony metastasis: Zometa as above.  Patient expressed understanding and was in agreement with this plan. He also understands that He can call clinic at any time with any questions, concerns, or complaints.   Cancer Staging Urothelial carcinoma of bladder [facility]) Staging form: Urinary Bladder, AJCC 8th Edition - Clinical: Stage IVA [place] Nancy, cN2, cM1a) - Signed by [name], MD on 04/09/2018   [name], MD   04/30/2019 6:18 AM

## 2019-04-29 ENCOUNTER — Inpatient Hospital Stay (HOSPITAL_BASED_OUTPATIENT_CLINIC_OR_DEPARTMENT_OTHER): Payer: Medicare Other | Admitting: Oncology

## 2019-04-29 ENCOUNTER — Inpatient Hospital Stay: Payer: Medicare Other

## 2019-04-29 ENCOUNTER — Other Ambulatory Visit: Payer: Self-pay

## 2019-04-29 ENCOUNTER — Encounter: Payer: Self-pay | Admitting: Oncology

## 2019-04-29 VITALS — BP 125/76 | HR 87 | Temp 98.0°F | Ht 72.0 in | Wt 185.0 lb

## 2019-04-29 DIAGNOSIS — C679 Malignant neoplasm of bladder, unspecified: Secondary | ICD-10-CM

## 2019-04-29 DIAGNOSIS — I519 Heart disease, unspecified: Secondary | ICD-10-CM | POA: Diagnosis not present

## 2019-04-29 DIAGNOSIS — Z923 Personal history of irradiation: Secondary | ICD-10-CM

## 2019-04-29 DIAGNOSIS — Z95828 Presence of other vascular implants and grafts: Secondary | ICD-10-CM

## 2019-04-29 DIAGNOSIS — C770 Secondary and unspecified malignant neoplasm of lymph nodes of head, face and neck: Secondary | ICD-10-CM | POA: Diagnosis not present

## 2019-04-29 DIAGNOSIS — C7951 Secondary malignant neoplasm of bone: Secondary | ICD-10-CM

## 2019-04-29 DIAGNOSIS — Z5111 Encounter for antineoplastic chemotherapy: Secondary | ICD-10-CM | POA: Diagnosis not present

## 2019-04-29 DIAGNOSIS — Z87891 Personal history of nicotine dependence: Secondary | ICD-10-CM

## 2019-04-29 DIAGNOSIS — M549 Dorsalgia, unspecified: Secondary | ICD-10-CM

## 2019-04-29 DIAGNOSIS — K5900 Constipation, unspecified: Secondary | ICD-10-CM

## 2019-04-29 LAB — CBC WITH DIFFERENTIAL/PLATELET
Abs Immature Granulocytes: 0.02 10*3/uL (ref 0.00–0.07)
Basophils Absolute: 0.1 10*3/uL (ref 0.0–0.1)
Basophils Relative: 2 %
Eosinophils Absolute: 0.3 10*3/uL (ref 0.0–0.5)
Eosinophils Relative: 7 %
HCT: 35.2 % — ABNORMAL LOW (ref 39.0–52.0)
Hemoglobin: 11.6 g/dL — ABNORMAL LOW (ref 13.0–17.0)
Immature Granulocytes: 0 %
Lymphocytes Relative: 23 %
Lymphs Abs: 1.1 10*3/uL (ref 0.7–4.0)
MCH: 31.1 pg (ref 26.0–34.0)
MCHC: 33 g/dL (ref 30.0–36.0)
MCV: 94.4 fL (ref 80.0–100.0)
Monocytes Absolute: 0.9 10*3/uL (ref 0.1–1.0)
Monocytes Relative: 19 %
Neutro Abs: 2.4 10*3/uL (ref 1.7–7.7)
Neutrophils Relative %: 49 %
Platelets: 270 10*3/uL (ref 150–400)
RBC: 3.73 MIL/uL — ABNORMAL LOW (ref 4.22–5.81)
RDW: 15.7 % — ABNORMAL HIGH (ref 11.5–15.5)
WBC: 4.8 10*3/uL (ref 4.0–10.5)
nRBC: 0 % (ref 0.0–0.2)

## 2019-04-29 LAB — COMPREHENSIVE METABOLIC PANEL
ALT: 14 U/L (ref 0–44)
AST: 16 U/L (ref 15–41)
Albumin: 3.9 g/dL (ref 3.5–5.0)
Alkaline Phosphatase: 84 U/L (ref 38–126)
Anion gap: 8 (ref 5–15)
BUN: 13 mg/dL (ref 8–23)
CO2: 23 mmol/L (ref 22–32)
Calcium: 8.8 mg/dL — ABNORMAL LOW (ref 8.9–10.3)
Chloride: 106 mmol/L (ref 98–111)
Creatinine, Ser: 1.18 mg/dL (ref 0.61–1.24)
GFR calc Af Amer: >60 mL/min (ref >60)
GFR calc non Af Amer: >60 mL/min (ref >60)
Glucose, Bld: 128 mg/dL — ABNORMAL HIGH (ref 70–99)
Potassium: 3.6 mmol/L (ref 3.5–5.1)
Sodium: 137 mmol/L (ref 135–145)
Total Bilirubin: 0.5 mg/dL (ref 0.3–1.2)
Total Protein: 7.1 g/dL (ref 6.5–8.1)

## 2019-04-29 MED ORDER — SODIUM CHLORIDE 0.9 % IV SOLN
Freq: Once | INTRAVENOUS | Status: AC
  Administered 2019-04-29: 10:00:00 via INTRAVENOUS
  Filled 2019-04-29: qty 250

## 2019-04-29 MED ORDER — PROCHLORPERAZINE MALEATE 10 MG PO TABS: 10.0000 mg | ORAL_TABLET | Freq: Four times a day (QID) | ORAL | 0 refills | Status: AC | PRN

## 2019-04-29 MED ORDER — PALONOSETRON HCL INJECTION 0.25 MG/5ML
0.2500 mg | Freq: Once | INTRAVENOUS | Status: AC
  Administered 2019-04-29: 0.25 mg via INTRAVENOUS
  Filled 2019-04-29: qty 5

## 2019-04-29 MED ORDER — HEPARIN SOD (PORK) LOCK FLUSH 100 UNIT/ML IV SOLN
500.0000 [IU] | Freq: Once | INTRAVENOUS | Status: AC | PRN
  Administered 2019-04-29: 500 [IU]
  Filled 2019-04-29: qty 5

## 2019-04-29 MED ORDER — SODIUM CHLORIDE 0.9 % IV SOLN
35.0000 mg/m2 | Freq: Once | INTRAVENOUS | Status: AC
  Administered 2019-04-29: 13:00:00 74 mg via INTRAVENOUS
  Filled 2019-04-29: qty 50

## 2019-04-29 MED ORDER — SODIUM CHLORIDE 0.9 % IV SOLN
Freq: Once | INTRAVENOUS | Status: AC
  Administered 2019-04-29: 12:00:00 via INTRAVENOUS
  Filled 2019-04-29: qty 5

## 2019-04-29 MED ORDER — HYDROCODONE-ACETAMINOPHEN 5-325 MG PO TABS: 1.0000 | ORAL_TABLET | Freq: Four times a day (QID) | ORAL | 0 refills | Status: DC | PRN

## 2019-04-29 MED ORDER — SODIUM CHLORIDE 0.9 % IV SOLN
1600.0000 mg | Freq: Once | INTRAVENOUS | Status: AC
  Administered 2019-04-29: 13:00:00 1600 mg via INTRAVENOUS
  Filled 2019-04-29: qty 26.3

## 2019-04-29 MED ORDER — POTASSIUM CHLORIDE 2 MEQ/ML IV SOLN
Freq: Once | INTRAVENOUS | Status: AC
  Administered 2019-04-29: 10:00:00 via INTRAVENOUS
  Filled 2019-04-29: qty 1000

## 2019-04-29 MED ORDER — SODIUM CHLORIDE 0.9% FLUSH
10.0000 mL | Freq: Once | INTRAVENOUS | Status: AC
  Administered 2019-04-29: 10 mL via INTRAVENOUS
  Filled 2019-04-29: qty 10

## 2019-04-29 NOTE — Progress Notes (Signed)
Patient stated that he continues to have back pain. Patient also stated that he has had nausea but vomiting. Patient's appetite is good at this time. Patient would like a refill on his hydrocodone-acetaminophen and would like something stronger for his nausea.

## 2019-05-02 NOTE — Progress Notes (Signed)
[facility]  Telephone:(336) [phone] Fax:(336) [phone]  ID: David Rich OB: [birth year]  MR#: [id number]  YCX#:[id number]  Patient Care Team: Baxter Hire, MD as PCP - General (Internal Medicine) [name], MD as PCP - Cardiology (Cardiology) [name], MD as Consulting Physician (Oncology)  CHIEF COMPLAINT: Recurrent stage IVa urothelial carcinoma with left supraclavicular lymph node metastasis.  INTERVAL HISTORY: Patient returns to clinic today for further evaluation and consideration of cycle 2, day 8 of cisplatin and gemcitabine.  He continues to have chronic weakness and fatigue and a poor appetite, but otherwise feels well.  He continues to have back pain but this is significantly improved.  He has no neurologic complaints.  He denies any recent fevers or illnesses.  He denies any chest pain, shortness of breath, cough, or hemoptysis.  He admits to increased nausea, but denies any vomiting or diarrhea.  His constipation is better controlled.  He has no urinary complaints.  Patient offers no further specific complaints today.  REVIEW OF SYSTEMS:   Review of Systems  Constitutional: Negative.  Negative for fever, malaise/fatigue and weight loss.  Respiratory: Negative.  Negative for cough and shortness of breath.   Cardiovascular: Negative.  Negative for chest pain and leg swelling.  Gastrointestinal: Positive for nausea. Negative for abdominal pain, constipation, diarrhea and vomiting.  Genitourinary: Negative.  Negative for dysuria and hematuria.  Musculoskeletal: Positive for back pain.  Skin: Negative.  Negative for rash.  Neurological: Negative.  Negative for sensory change, focal weakness, weakness and headaches.  Psychiatric/Behavioral: Negative.  The patient is not nervous/anxious.     As per HPI. Otherwise, a complete review of systems is negative.  PAST MEDICAL HISTORY: Past Medical History:  Diagnosis Date  . Cancer [facility])     prostate  . Hyperlipidemia   . Hypertension   . Renal disorder     PAST SURGICAL HISTORY: Past Surgical History:  Procedure Laterality Date  . CORONARY STENT INTERVENTION N/A 09/28/2018   Procedure: CORONARY STENT INTERVENTION;  Surgeon: [name], MD;  Location: [place] [facility];  Service: Cardiovascular;  Laterality: N/A;  . CORONARY STENT PLACEMENT    . IR FLUORO GUIDE CV LINE RIGHT  04/03/2018  . kidney removed Left 2013  . LEFT HEART CATH AND CORONARY ANGIOGRAPHY N/A 09/28/2018   Procedure: LEFT HEART CATH AND CORONARY ANGIOGRAPHY;  Surgeon: [name], MD;  Location: [place] [facility];  Service: Cardiovascular;  Laterality: N/A;    FAMILY HISTORY: Family History  Problem Relation Age of Onset  . Coronary artery disease Mother   . Anemia Mother   . Kidney failure Mother   . Subarachnoid hemorrhage Father   . Breast cancer Sister 10  . Hypertension Brother     ADVANCED DIRECTIVES (Y/N):  N  HEALTH MAINTENANCE: Social History   Tobacco Use  . Smoking status: Former Smoker    Packs/day: 1.00    Years: 55.00    Pack years: 55.00    Types: Cigarettes    Quit date: 03/20/2012    Years since quitting: 7.1  . Smokeless tobacco: Never Used  Substance Use Topics  . Alcohol use: Yes    Comment: Social  . Drug use: No     Colonoscopy:  PAP:  Bone density:  Lipid panel:  Allergies  Allergen Reactions  . Clopidogrel Rash and Hives  . Rosuvastatin Calcium Anaphylaxis  . Etodolac Nausea And Vomiting and Other (See Comments)    Bad dreams  Bad dreams   . Hydrocodone Other (See Comments)    Bad dreams Bad dreams   . Other   . Statins   . Meloxicam Rash    Bad dreams Bad dreams   . Pravastatin Sodium Rash    Current Outpatient Medications  Medication Sig Dispense Refill  . acetaminophen (TYLENOL) 325 MG tablet Take 325 mg by mouth every 4 (four) hours as needed.     [name] amLODipine (NORVASC) 10 MG tablet TAKE 1 TABLET BY MOUTH EVERY  DAY    . aspirin EC 81 MG tablet Take by mouth.    . carvedilol (COREG) 6.25 MG tablet Take 1 tablet (6.25 mg total) by mouth 2 (two) times daily. 60 tablet 6  . clopidogrel (PLAVIX) 75 MG tablet Take 75 mg by mouth daily.    [name] ezetimibe (ZETIA) 10 MG tablet TAKE 1 TABLET BY MOUTH DAILY 90 tablet 0  . famotidine (PEPCID) 20 MG tablet Take 20 mg by mouth 2 (two) times daily.     . fentaNYL (DURAGESIC) 25 MCG/HR Place 1 patch onto the skin every 3 (three) days. 10 patch 0  . Garlic 952 MG TABS Take by mouth.     [name] HYDROcodone-acetaminophen (NORCO/VICODIN) 5-325 MG tablet Take 1 tablet by mouth every 6 (six) hours as needed for moderate pain. 120 tablet 0  . lidocaine-prilocaine (EMLA) cream Apply to affected area once 30 g 3  . magnesium hydroxide (MILK OF MAGNESIA) 400 MG/5ML suspension Take 30 mLs by mouth.    . Multiple Vitamin (MULTI-VITAMINS) TABS Take by mouth.    . nitroGLYCERIN (NITROSTAT) 0.4 MG SL tablet Place under the tongue.    . Omega-3 Fatty Acids (FISH OIL OMEGA-3) 1000 MG CAPS Take by mouth daily.    . ondansetron (ZOFRAN) 8 MG tablet Take 1 tablet (8 mg total) by mouth every 8 (eight) hours as needed for nausea or vomiting. 30 tablet 3  . polyethylene glycol (MIRALAX / GLYCOLAX) 17 g packet Take 17 g by mouth daily.    . prochlorperazine (COMPAZINE) 10 MG tablet Take 1 tablet (10 mg total) by mouth every 6 (six) hours as needed for nausea or vomiting. 30 tablet 0  . sucralfate (CARAFATE) 1 g tablet Take 1 tablet (1 g total) by mouth 3 (three) times daily. 90 tablet 3   No current facility-administered medications for this visit.    Facility-Administered Medications Ordered in Other Visits  Medication Dose Route Frequency Provider Last Rate Last Dose  . CISplatin (PLATINOL) 74 mg in sodium chloride 0.9 % 250 mL chemo infusion  35 mg/m2 (Treatment Plan Recorded) Intravenous Once [name], MD      . fosaprepitant (EMEND) 150 mg, dexamethasone (DECADRON) 12 mg in sodium  chloride 0.9 % 145 mL IVPB   Intravenous Once [name], MD      . gemcitabine (GEMZAR) 1,600 mg in sodium chloride 0.9 % 250 mL chemo infusion  1,600 mg Intravenous Once [name], MD      . palonosetron (ALOXI) injection 0.25 mg  0.25 mg Intravenous Once [name], MD      . sodium chloride flush (NS) 0.9 % injection 10 mL  10 mL Intravenous PRN [name], MD   10 mL at 10/05/18 1026  . Zoledronic Acid (ZOMETA) IVPB 4 mg  4 mg Intravenous Once [name], MD        OBJECTIVE: Vitals:   05/06/19 0854  BP: 111/66  Pulse: 78  Resp: 18  Temp: (!) 96.9 F (36.1 C)     Body mass index is 25.01 kg/m.    ECOG FS:0 - Asymptomatic   General: Well-developed, well-nourished, no acute distress. Eyes: Pink conjunctiva, anicteric sclera. HEENT: Normocephalic, moist mucous membranes. Lungs: Clear to auscultation bilaterally. Heart: Regular rate and rhythm. No rubs, murmurs, or gallops. Abdomen: Soft, nontender, nondistended. No organomegaly noted, normoactive bowel sounds. Musculoskeletal: No edema, cyanosis, or clubbing. Neuro: Alert, answering all questions appropriately. Cranial nerves grossly intact. Skin: No rashes or petechiae noted. Psych: Normal affect.  LAB RESULTS:  Lab Results  Component Value Date   NA 134 (L) 05/06/2019   K 3.8 05/06/2019   CL 104 05/06/2019   CO2 22 05/06/2019   GLUCOSE 137 (H) 05/06/2019   BUN 15 05/06/2019   CREATININE 1.07 05/06/2019   CALCIUM 8.9 05/06/2019   PROT 7.1 05/06/2019   ALBUMIN 3.8 05/06/2019   AST 19 05/06/2019   ALT 21 05/06/2019   ALKPHOS 82 05/06/2019   BILITOT 0.4 05/06/2019   GFRNONAA >60 05/06/2019   GFRAA >60 05/06/2019    Lab Results  Component Value Date   WBC 3.1 (L) 05/06/2019   NEUTROABS 2.1 05/06/2019   HGB 10.8 (L) 05/06/2019   HCT 33.1 (L) 05/06/2019   MCV 94.6 05/06/2019   PLT 134 (L) 05/06/2019     STUDIES: No results found.  ASSESSMENT: Recurrent stage  IVa urothelial carcinoma with left supraclavicular lymph node metastasis.  PDL 1 0%  PLAN:    1.  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 CT scan results from March 11, 2019 reviewed independently with continued increase in size and number of pulmonary nodules.  MRI of his thoracic spine also revealed metastatic disease.  Patient has now completed XRT to his back.  Proceed with cycle 2, day 8 of cisplatin and gemcitabine today.  Patient will also receive Zometa today.  Return to clinic in 2 weeks for further evaluation and consideration of cycle 3, day 1.   2.  Genetic testing: Negative. 3.  Kidney function: Patient's creatinine continues to be within normal limits. 4.  Cardiac disease: Continue follow-up with cardiology as indicated. 5.  Back pain: MRI results from March 27, 2019 reviewed independently consistent with metastatic disease.  Patient has now completed XRT, but still has residual pain.  Continue fentanyl patch and hydrocodone as prescribed. 6.  Constipation: Continue current bowel regimen and have recommended adding MiraLAX or magnesium citrate OTC. 7.  Bony metastasis: Zometa as above. 8.  Poor appetite: Patient was given a prescription for Megace today.  Consider dietary referral in the future.  Patient expressed understanding and was in agreement with this plan. He also understands that He can call clinic at any time with any questions, concerns, or complaints.   Cancer Staging Urothelial carcinoma of bladder [facility]) Staging form: Urinary Bladder, AJCC 8th Edition - Clinical: Stage IVA [facility] Nancy, cN2, cM1a) - Signed by [name], MD on 04/09/2018   [name], MD   05/06/2019 10:20 AM

## 2019-05-06 ENCOUNTER — Inpatient Hospital Stay: Payer: Medicare Other

## 2019-05-06 ENCOUNTER — Other Ambulatory Visit: Payer: Self-pay

## 2019-05-06 ENCOUNTER — Encounter: Payer: Self-pay | Admitting: Oncology

## 2019-05-06 ENCOUNTER — Inpatient Hospital Stay (HOSPITAL_BASED_OUTPATIENT_CLINIC_OR_DEPARTMENT_OTHER): Payer: Medicare Other | Admitting: Oncology

## 2019-05-06 VITALS — BP 111/66 | HR 78 | Temp 96.9°F | Resp 18 | Wt 184.4 lb

## 2019-05-06 DIAGNOSIS — C679 Malignant neoplasm of bladder, unspecified: Secondary | ICD-10-CM | POA: Diagnosis not present

## 2019-05-06 DIAGNOSIS — K5900 Constipation, unspecified: Secondary | ICD-10-CM

## 2019-05-06 DIAGNOSIS — Z923 Personal history of irradiation: Secondary | ICD-10-CM

## 2019-05-06 DIAGNOSIS — C770 Secondary and unspecified malignant neoplasm of lymph nodes of head, face and neck: Secondary | ICD-10-CM

## 2019-05-06 DIAGNOSIS — C7951 Secondary malignant neoplasm of bone: Secondary | ICD-10-CM | POA: Diagnosis not present

## 2019-05-06 DIAGNOSIS — Z87891 Personal history of nicotine dependence: Secondary | ICD-10-CM

## 2019-05-06 DIAGNOSIS — Z79899 Other long term (current) drug therapy: Secondary | ICD-10-CM

## 2019-05-06 DIAGNOSIS — R630 Anorexia: Secondary | ICD-10-CM

## 2019-05-06 DIAGNOSIS — I519 Heart disease, unspecified: Secondary | ICD-10-CM | POA: Diagnosis not present

## 2019-05-06 DIAGNOSIS — M549 Dorsalgia, unspecified: Secondary | ICD-10-CM

## 2019-05-06 DIAGNOSIS — Z5111 Encounter for antineoplastic chemotherapy: Secondary | ICD-10-CM | POA: Diagnosis not present

## 2019-05-06 DIAGNOSIS — Z95828 Presence of other vascular implants and grafts: Secondary | ICD-10-CM

## 2019-05-06 LAB — COMPREHENSIVE METABOLIC PANEL
ALT: 21 U/L (ref 0–44)
AST: 19 U/L (ref 15–41)
Albumin: 3.8 g/dL (ref 3.5–5.0)
Alkaline Phosphatase: 82 U/L (ref 38–126)
Anion gap: 8 (ref 5–15)
BUN: 15 mg/dL (ref 8–23)
CO2: 22 mmol/L (ref 22–32)
Calcium: 8.9 mg/dL (ref 8.9–10.3)
Chloride: 104 mmol/L (ref 98–111)
Creatinine, Ser: 1.07 mg/dL (ref 0.61–1.24)
GFR calc Af Amer: >60 mL/min (ref >60)
GFR calc non Af Amer: >60 mL/min (ref >60)
Glucose, Bld: 137 mg/dL — ABNORMAL HIGH (ref 70–99)
Potassium: 3.8 mmol/L (ref 3.5–5.1)
Sodium: 134 mmol/L — ABNORMAL LOW (ref 135–145)
Total Bilirubin: 0.4 mg/dL (ref 0.3–1.2)
Total Protein: 7.1 g/dL (ref 6.5–8.1)

## 2019-05-06 LAB — CBC WITH DIFFERENTIAL/PLATELET
Abs Immature Granulocytes: 0.01 10*3/uL (ref 0.00–0.07)
Basophils Absolute: 0.1 10*3/uL (ref 0.0–0.1)
Basophils Relative: 2 %
Eosinophils Absolute: 0.1 10*3/uL (ref 0.0–0.5)
Eosinophils Relative: 2 %
HCT: 33.1 % — ABNORMAL LOW (ref 39.0–52.0)
Hemoglobin: 10.8 g/dL — ABNORMAL LOW (ref 13.0–17.0)
Immature Granulocytes: 0 %
Lymphocytes Relative: 20 %
Lymphs Abs: 0.6 10*3/uL — ABNORMAL LOW (ref 0.7–4.0)
MCH: 30.9 pg (ref 26.0–34.0)
MCHC: 32.6 g/dL (ref 30.0–36.0)
MCV: 94.6 fL (ref 80.0–100.0)
Monocytes Absolute: 0.3 10*3/uL (ref 0.1–1.0)
Monocytes Relative: 9 %
Neutro Abs: 2.1 10*3/uL (ref 1.7–7.7)
Neutrophils Relative %: 67 %
Platelets: 134 10*3/uL — ABNORMAL LOW (ref 150–400)
RBC: 3.5 MIL/uL — ABNORMAL LOW (ref 4.22–5.81)
RDW: 15.5 % (ref 11.5–15.5)
WBC: 3.1 10*3/uL — ABNORMAL LOW (ref 4.0–10.5)
nRBC: 0 % (ref 0.0–0.2)

## 2019-05-06 MED ORDER — POTASSIUM CHLORIDE 2 MEQ/ML IV SOLN
Freq: Once | INTRAVENOUS | Status: AC
  Administered 2019-05-06: 10:00:00 via INTRAVENOUS
  Filled 2019-05-06: qty 1000

## 2019-05-06 MED ORDER — HEPARIN SOD (PORK) LOCK FLUSH 100 UNIT/ML IV SOLN
500.0000 [IU] | Freq: Once | INTRAVENOUS | Status: AC
  Administered 2019-05-06: 500 [IU] via INTRAVENOUS
  Filled 2019-05-06: qty 5

## 2019-05-06 MED ORDER — SODIUM CHLORIDE 0.9 % IV SOLN
Freq: Once | INTRAVENOUS | Status: AC
  Administered 2019-05-06: 12:00:00 via INTRAVENOUS
  Filled 2019-05-06: qty 5

## 2019-05-06 MED ORDER — ZOLEDRONIC ACID 4 MG/100ML IV SOLN
4.0000 mg | Freq: Once | INTRAVENOUS | Status: AC
  Administered 2019-05-06: 4 mg via INTRAVENOUS
  Filled 2019-05-06: qty 100

## 2019-05-06 MED ORDER — PALONOSETRON HCL INJECTION 0.25 MG/5ML
0.2500 mg | Freq: Once | INTRAVENOUS | Status: AC
  Administered 2019-05-06: 0.25 mg via INTRAVENOUS
  Filled 2019-05-06: qty 5

## 2019-05-06 MED ORDER — SODIUM CHLORIDE 0.9 % IV SOLN
Freq: Once | INTRAVENOUS | Status: AC
  Administered 2019-05-06: 09:00:00 via INTRAVENOUS
  Filled 2019-05-06: qty 250

## 2019-05-06 MED ORDER — MEGESTROL ACETATE 20 MG PO TABS: 20.0000 mg | ORAL_TABLET | Freq: Every day | ORAL | 1 refills | Status: AC

## 2019-05-06 MED ORDER — SODIUM CHLORIDE 0.9 % IV SOLN
1600.0000 mg | Freq: Once | INTRAVENOUS | Status: AC
  Administered 2019-05-06: 1600 mg via INTRAVENOUS
  Filled 2019-05-06: qty 26.3

## 2019-05-06 MED ORDER — SODIUM CHLORIDE 0.9% FLUSH
10.0000 mL | Freq: Once | INTRAVENOUS | Status: AC
  Administered 2019-05-06: 10 mL via INTRAVENOUS
  Filled 2019-05-06: qty 10

## 2019-05-06 MED ORDER — SODIUM CHLORIDE 0.9 % IV SOLN
35.0000 mg/m2 | Freq: Once | INTRAVENOUS | Status: AC
  Administered 2019-05-06: 74 mg via INTRAVENOUS
  Filled 2019-05-06: qty 74

## 2019-05-06 NOTE — Progress Notes (Signed)
Pt states 'just feel lousy all the time".  Still having difficulties with nausea and constipation.

## 2019-05-06 NOTE — Addendum Note (Signed)
Addended by: Wayna Chalet on: 05/06/2019 03:43 PM   Modules accepted: Orders

## 2019-05-15 NOTE — Progress Notes (Signed)
[place]  Telephone:(336) [phone] Fax:(336) [phone]  ID: [name] OB: [birth year]  MR#: [id number]  QAS#:[id number]  Patient Care Team: Baxter Hire, MD as PCP - General (Internal Medicine) [name], MD as PCP - Cardiology (Cardiology) [name], MD as Consulting Physician (Oncology)  CHIEF COMPLAINT: Recurrent stage IVa urothelial carcinoma with left supraclavicular lymph node metastasis.  INTERVAL HISTORY: Patient returns to clinic today for further evaluation and consideration of cycle 3, day 1 of cisplatin and gemcitabine.  He has worsening abdominal pain, particularly at night.  He has increasing weakness and fatigue.  He continues to have nausea, but this improved since discontinuing his fentanyl patch.  His back pain is worse because of of this.  He has no neurologic complaints.  He denies any recent fevers or illnesses.  He denies any chest pain, shortness of breath, cough, or hemoptysis.  He has no urinary complaints.  Patient feels generally terrible, but offers no further specific complaints today.  REVIEW OF SYSTEMS:   Review of Systems  Constitutional: Positive for malaise/fatigue. Negative for fever and weight loss.  Respiratory: Negative.  Negative for cough and shortness of breath.   Cardiovascular: Negative.  Negative for chest pain and leg swelling.  Gastrointestinal: Positive for abdominal pain and nausea. Negative for constipation, diarrhea and vomiting.  Genitourinary: Negative.  Negative for dysuria and hematuria.  Musculoskeletal: Positive for back pain.  Skin: Negative.  Negative for rash.  Neurological: Positive for weakness. Negative for sensory change, focal weakness and headaches.  Psychiatric/Behavioral: Negative.  The patient is not nervous/anxious.     As per HPI. Otherwise, a complete review of systems is negative.  PAST MEDICAL HISTORY: Past Medical History:  Diagnosis Date   Cancer [facility])    prostate   Hyperlipidemia    Hypertension    Renal disorder     PAST SURGICAL HISTORY: Past Surgical History:  Procedure Laterality Date   CORONARY STENT INTERVENTION N/A 09/28/2018   Procedure: CORONARY STENT INTERVENTION;  Surgeon: [name], MD;  Location: [place] [facility];  Service: Cardiovascular;  Laterality: N/A;   CORONARY STENT PLACEMENT     IR FLUORO GUIDE CV LINE RIGHT  04/03/2018   kidney removed Left 2013   LEFT HEART CATH AND CORONARY ANGIOGRAPHY N/A 09/28/2018   Procedure: LEFT HEART CATH AND CORONARY ANGIOGRAPHY;  Surgeon: [name], MD;  Location: [place] [facility];  Service: Cardiovascular;  Laterality: N/A;    FAMILY HISTORY: Family History  Problem Relation Age of Onset   Coronary artery disease Mother    Anemia Mother    Kidney failure Mother    Subarachnoid hemorrhage Father    Breast cancer Sister 45   Hypertension Brother     ADVANCED DIRECTIVES (Y/N):  N  HEALTH MAINTENANCE: Social History   Tobacco Use   Smoking status: Former Smoker    Packs/day: 1.00    Years: 55.00    Pack years: 55.00    Types: Cigarettes    Quit date: 03/20/2012    Years since quitting: 7.1   Smokeless tobacco: Never Used  Substance Use Topics   Alcohol use: Yes    Comment: Social   Drug use: No     Colonoscopy:  PAP:  Bone density:  Lipid panel:  Allergies  Allergen Reactions   Clopidogrel Rash and Hives   Rosuvastatin Calcium Anaphylaxis   Etodolac Nausea And Vomiting and Other (See Comments)    Bad dreams Bad dreams  Hydrocodone Other (See Comments)    Bad dreams Bad dreams    Other    Statins    Meloxicam Rash    Bad dreams Bad dreams    Pravastatin Sodium Rash    Current Outpatient Medications  Medication Sig Dispense Refill   acetaminophen (TYLENOL) 325 MG tablet Take 325 mg by mouth every 4 (four) hours as needed.      amLODipine (NORVASC) 10 MG tablet TAKE 1 TABLET BY MOUTH EVERY DAY      aspirin EC 81 MG tablet Take by mouth.     carvedilol (COREG) 6.25 MG tablet Take 1 tablet (6.25 mg total) by mouth 2 (two) times daily. 60 tablet 6   clopidogrel (PLAVIX) 75 MG tablet Take 75 mg by mouth daily.     ezetimibe (ZETIA) 10 MG tablet TAKE 1 TABLET BY MOUTH DAILY 90 tablet 0   famotidine (PEPCID) 20 MG tablet Take 20 mg by mouth 2 (two) times daily.      fentaNYL (DURAGESIC) 25 MCG/HR Place 1 patch onto the skin every 3 (three) days. 10 patch 0   Garlic 767 MG TABS Take by mouth.      HYDROcodone-acetaminophen (NORCO/VICODIN) 5-325 MG tablet Take 1 tablet by mouth every 6 (six) hours as needed for moderate pain. 120 tablet 0   lidocaine-prilocaine (EMLA) cream Apply to affected area once 30 g 3   magnesium hydroxide (MILK OF MAGNESIA) 400 MG/5ML suspension Take 30 mLs by mouth.     megestrol (MEGACE) 20 MG tablet Take 1 tablet (20 mg total) by mouth daily. 30 tablet 1   Multiple Vitamin (MULTI-VITAMINS) TABS Take by mouth.     nitroGLYCERIN (NITROSTAT) 0.4 MG SL tablet Place under the tongue.     Omega-3 Fatty Acids (FISH OIL OMEGA-3) 1000 MG CAPS Take by mouth daily.     ondansetron (ZOFRAN) 8 MG tablet Take 1 tablet (8 mg total) by mouth every 8 (eight) hours as needed for nausea or vomiting. 30 tablet 3   polyethylene glycol (MIRALAX / GLYCOLAX) 17 g packet Take 17 g by mouth daily.     prochlorperazine (COMPAZINE) 10 MG tablet Take 1 tablet (10 mg total) by mouth every 6 (six) hours as needed for nausea or vomiting. 30 tablet 0   sucralfate (CARAFATE) 1 g tablet Take 1 tablet (1 g total) by mouth 3 (three) times daily. 90 tablet 3   metoCLOPramide (REGLAN) 10 MG tablet Take 1 tablet (10 mg total) by mouth 4 (four) times daily. 30 tablet 1   No current facility-administered medications for this visit.    Facility-Administered Medications Ordered in Other Visits  Medication Dose Route Frequency Provider Last Rate Last Dose   sodium chloride flush (NS) 0.9  % injection 10 mL  10 mL Intravenous PRN [name], MD   10 mL at 10/05/18 1026    OBJECTIVE: Vitals:   05/20/19 0844  BP: 125/73  Pulse: 64  Resp: 18  Temp: (!) 96.8 F (36 C)     Body mass index is 24.91 kg/m.    ECOG FS:0 - Asymptomatic   General: Well-developed, well-nourished, no acute distress. Eyes: Pink conjunctiva, anicteric sclera. HEENT: Normocephalic, moist mucous membranes. Lungs: Clear to auscultation bilaterally. Heart: Regular rate and rhythm. No rubs, murmurs, or gallops. Abdomen: Soft, nontender, nondistended. No organomegaly noted, normoactive bowel sounds. Musculoskeletal: No edema, cyanosis, or clubbing. Neuro: Alert, answering all questions appropriately. Cranial nerves grossly intact. Skin: No rashes or petechiae noted. Psych: Normal affect.  LAB RESULTS:  Lab Results  Component Value Date   NA 137 05/20/2019   K 3.9 05/20/2019   CL 108 05/20/2019   CO2 21 (L) 05/20/2019   GLUCOSE 124 (H) 05/20/2019   BUN 12 05/20/2019   CREATININE 1.06 05/20/2019   CALCIUM 8.2 (L) 05/20/2019   PROT 6.4 (L) 05/20/2019   ALBUMIN 3.8 05/20/2019   AST 19 05/20/2019   ALT 21 05/20/2019   ALKPHOS 82 05/20/2019   BILITOT 0.4 05/20/2019   GFRNONAA >60 05/20/2019   GFRAA >60 05/20/2019    Lab Results  Component Value Date   WBC 2.0 (L) 05/20/2019   NEUTROABS 1.0 (L) 05/20/2019   HGB 9.2 (L) 05/20/2019   HCT 27.9 (L) 05/20/2019   MCV 94.9 05/20/2019   PLT 81 (L) 05/20/2019     STUDIES: No results found.  ASSESSMENT: Recurrent stage IVa urothelial carcinoma with left supraclavicular lymph node metastasis.  PDL 1 0%  PLAN:    1.  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 CT scan results from March 11, 2019 reviewed independently with continued increase in size and number of pulmonary nodules.  MRI of his thoracic  spine also revealed metastatic disease.  Patient has now completed XRT to his back.  Delay cycle 3, day 1 of cisplatin and gemcitabine today secondary to declining performance status, thrombocytopenia, and neutropenia.  Return to clinic in 1 week for further evaluation and reconsideration of treatment.   2.  Genetic testing: Negative. 3.  Kidney function: Patient's creatinine continues to be within normal limits. 4.  Cardiac disease: Continue follow-up with cardiology as indicated. 5.  Back pain: MRI results from March 27, 2019 reviewed independently consistent with metastatic disease.  Patient has now completed XRT, but still has residual pain.  He has discontinued his fentanyl patch secondary to nausea, but continues oxycodone as needed. 6.  Constipation: Continue current bowel regimen and have recommended adding MiraLAX or magnesium citrate OTC. 7.  Bony metastasis: Zometa as above. 8.  Abdominal pain: Unclear etiology.  Amylase and lipase are within normal limits.  There is no evidence of liver or biliary disease.  Will get CT of the abdomen and pelvis for further evaluation.  Patient was also given a prescription for Reglan today. 9.  Pancytopenia: Secondary to chemotherapy, delay treatment as above.  Patient expressed understanding and was in agreement with this plan. He also understands that He can call clinic at any time with any questions, concerns, or complaints.   Cancer Staging Urothelial carcinoma of bladder [facility]) Staging form: Urinary Bladder, AJCC 8th Edition - Clinical: Stage IVA Laurier Nancy, cN2, cM1a) - Signed by [name], MD on 04/09/2018   [name], MD   05/20/2019 6:34 PM

## 2019-05-19 ENCOUNTER — Other Ambulatory Visit: Payer: Self-pay

## 2019-05-20 ENCOUNTER — Inpatient Hospital Stay (HOSPITAL_BASED_OUTPATIENT_CLINIC_OR_DEPARTMENT_OTHER): Payer: Medicare Other | Admitting: Oncology

## 2019-05-20 ENCOUNTER — Other Ambulatory Visit: Payer: Self-pay

## 2019-05-20 ENCOUNTER — Ambulatory Visit: Payer: Medicare Other | Admitting: Radiation Oncology

## 2019-05-20 ENCOUNTER — Encounter: Payer: Self-pay | Admitting: Oncology

## 2019-05-20 ENCOUNTER — Inpatient Hospital Stay: Payer: Medicare Other | Attending: Oncology

## 2019-05-20 ENCOUNTER — Inpatient Hospital Stay: Payer: Medicare Other

## 2019-05-20 VITALS — BP 125/73 | HR 64 | Temp 96.8°F | Resp 18 | Wt 183.7 lb

## 2019-05-20 DIAGNOSIS — Z8042 Family history of malignant neoplasm of prostate: Secondary | ICD-10-CM | POA: Diagnosis not present

## 2019-05-20 DIAGNOSIS — D61810 Antineoplastic chemotherapy induced pancytopenia: Secondary | ICD-10-CM | POA: Insufficient documentation

## 2019-05-20 DIAGNOSIS — Z87891 Personal history of nicotine dependence: Secondary | ICD-10-CM | POA: Insufficient documentation

## 2019-05-20 DIAGNOSIS — K5900 Constipation, unspecified: Secondary | ICD-10-CM | POA: Insufficient documentation

## 2019-05-20 DIAGNOSIS — C679 Malignant neoplasm of bladder, unspecified: Secondary | ICD-10-CM

## 2019-05-20 DIAGNOSIS — R5383 Other fatigue: Secondary | ICD-10-CM | POA: Insufficient documentation

## 2019-05-20 DIAGNOSIS — R110 Nausea: Secondary | ICD-10-CM | POA: Insufficient documentation

## 2019-05-20 DIAGNOSIS — Z923 Personal history of irradiation: Secondary | ICD-10-CM | POA: Diagnosis not present

## 2019-05-20 DIAGNOSIS — G893 Neoplasm related pain (acute) (chronic): Secondary | ICD-10-CM | POA: Insufficient documentation

## 2019-05-20 DIAGNOSIS — R109 Unspecified abdominal pain: Secondary | ICD-10-CM | POA: Diagnosis not present

## 2019-05-20 DIAGNOSIS — I519 Heart disease, unspecified: Secondary | ICD-10-CM | POA: Diagnosis not present

## 2019-05-20 DIAGNOSIS — Z95828 Presence of other vascular implants and grafts: Secondary | ICD-10-CM

## 2019-05-20 DIAGNOSIS — R531 Weakness: Secondary | ICD-10-CM | POA: Insufficient documentation

## 2019-05-20 DIAGNOSIS — I2510 Atherosclerotic heart disease of native coronary artery without angina pectoris: Secondary | ICD-10-CM

## 2019-05-20 LAB — CBC WITH DIFFERENTIAL/PLATELET
Abs Immature Granulocytes: 0.01 10*3/uL (ref 0.00–0.07)
Basophils Absolute: 0 10*3/uL (ref 0.0–0.1)
Basophils Relative: 1 %
Eosinophils Absolute: 0.1 10*3/uL (ref 0.0–0.5)
Eosinophils Relative: 4 %
HCT: 27.9 % — ABNORMAL LOW (ref 39.0–52.0)
Hemoglobin: 9.2 g/dL — ABNORMAL LOW (ref 13.0–17.0)
Immature Granulocytes: 1 %
Lymphocytes Relative: 30 %
Lymphs Abs: 0.6 10*3/uL — ABNORMAL LOW (ref 0.7–4.0)
MCH: 31.3 pg (ref 26.0–34.0)
MCHC: 33 g/dL (ref 30.0–36.0)
MCV: 94.9 fL (ref 80.0–100.0)
Monocytes Absolute: 0.3 10*3/uL (ref 0.1–1.0)
Monocytes Relative: 15 %
Neutro Abs: 1 10*3/uL — ABNORMAL LOW (ref 1.7–7.7)
Neutrophils Relative %: 49 %
Platelets: 81 10*3/uL — ABNORMAL LOW (ref 150–400)
RBC: 2.94 MIL/uL — ABNORMAL LOW (ref 4.22–5.81)
RDW: 16.5 % — ABNORMAL HIGH (ref 11.5–15.5)
WBC: 2 10*3/uL — ABNORMAL LOW (ref 4.0–10.5)
nRBC: 0 % (ref 0.0–0.2)

## 2019-05-20 LAB — COMPREHENSIVE METABOLIC PANEL
ALT: 21 U/L (ref 0–44)
AST: 19 U/L (ref 15–41)
Albumin: 3.8 g/dL (ref 3.5–5.0)
Alkaline Phosphatase: 82 U/L (ref 38–126)
Anion gap: 8 (ref 5–15)
BUN: 12 mg/dL (ref 8–23)
CO2: 21 mmol/L — ABNORMAL LOW (ref 22–32)
Calcium: 8.2 mg/dL — ABNORMAL LOW (ref 8.9–10.3)
Chloride: 108 mmol/L (ref 98–111)
Creatinine, Ser: 1.06 mg/dL (ref 0.61–1.24)
GFR calc Af Amer: >60 mL/min (ref >60)
GFR calc non Af Amer: >60 mL/min (ref >60)
Glucose, Bld: 124 mg/dL — ABNORMAL HIGH (ref 70–99)
Potassium: 3.9 mmol/L (ref 3.5–5.1)
Sodium: 137 mmol/L (ref 135–145)
Total Bilirubin: 0.4 mg/dL (ref 0.3–1.2)
Total Protein: 6.4 g/dL — ABNORMAL LOW (ref 6.5–8.1)

## 2019-05-20 LAB — LIPASE, BLOOD: Lipase: 20 U/L (ref 11–51)

## 2019-05-20 LAB — AMYLASE: Amylase: 55 U/L (ref 28–100)

## 2019-05-20 MED ORDER — METOCLOPRAMIDE HCL 10 MG PO TABS: 10.0000 mg | ORAL_TABLET | Freq: Four times a day (QID) | ORAL | 1 refills | Status: AC

## 2019-05-20 MED ORDER — HEPARIN SOD (PORK) LOCK FLUSH 100 UNIT/ML IV SOLN
500.0000 [IU] | Freq: Once | INTRAVENOUS | Status: AC
  Administered 2019-05-20: 10:00:00 500 [IU] via INTRAVENOUS

## 2019-05-20 MED ORDER — SODIUM CHLORIDE 0.9% FLUSH
10.0000 mL | Freq: Once | INTRAVENOUS | Status: AC
  Administered 2019-05-20: 08:00:00 10 mL via INTRAVENOUS
  Filled 2019-05-20: qty 10

## 2019-05-20 NOTE — Progress Notes (Signed)
Patient unable to tell me allergies to medications, he complains of stomach hurting which is causing him not to rest and he has no appetite.

## 2019-05-21 ENCOUNTER — Other Ambulatory Visit: Payer: Self-pay

## 2019-05-21 NOTE — Progress Notes (Signed)
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  INTERVAL HISTORY: Patient returns to clinic today for further evaluation, discussion of his imaging results, and treatment planning.  He continues to have abdominal pain, poor appetite, and nausea.  He also has insomnia.  He is not taking any narcotics therefore his back pain has become worse.  He has no neurologic complaints.  He denies any recent fevers or illnesses.  He denies any chest pain, shortness of breath, cough, or hemoptysis.  He has no urinary complaints.  Patient offers no further specific complaints today.  REVIEW OF SYSTEMS:   Review of Systems  Constitutional: Positive for malaise/fatigue. Negative for fever and weight loss.  Respiratory: Negative.  Negative for cough and shortness of breath.   Cardiovascular: Negative.  Negative for chest pain and leg swelling.  Gastrointestinal: Positive for abdominal pain and nausea. Negative for constipation, diarrhea and vomiting.  Genitourinary: Negative.  Negative for dysuria and hematuria.  Musculoskeletal: Positive for back pain.  Skin: Negative.  Negative for rash.  Neurological: Positive for weakness. Negative for sensory change, focal weakness and headaches.  Psychiatric/Behavioral: Negative.  The patient is not nervous/anxious.     As per HPI. Otherwise, a complete review of systems is negative.  PAST MEDICAL HISTORY: Past Medical History:  Diagnosis Date  . [facility]) 2013   Right Renal   . Hyperlipidemia   . Hypertension   . Renal disorder   . Urothelial carcinoma of bladder  ([place]) 2020    PAST SURGICAL HISTORY: Past Surgical History:  Procedure Laterality Date  . CORONARY STENT INTERVENTION N/A 09/28/2018   Procedure: CORONARY STENT INTERVENTION;  Surgeon: [name], MD;  Location: [place] [facility];  Service: Cardiovascular;  Laterality: N/A;  . CORONARY STENT PLACEMENT    . IR FLUORO GUIDE CV LINE RIGHT  04/03/2018  . kidney removed Left 2013  . LEFT HEART CATH AND CORONARY ANGIOGRAPHY N/A 09/28/2018   Procedure: LEFT HEART CATH AND CORONARY ANGIOGRAPHY;  Surgeon: [name], MD;  Location: [place] [facility];  Service: Cardiovascular;  Laterality: N/A;    FAMILY HISTORY: Family History  Problem Relation Age of Onset  . Coronary artery disease Mother   . Anemia Mother   . Kidney failure Mother   . Subarachnoid hemorrhage Father   . Breast cancer Sister 22  . Hypertension Brother     ADVANCED DIRECTIVES (Y/N):  N  HEALTH MAINTENANCE: Social History   Tobacco Use  . Smoking status: Former Smoker    Packs/day: 1.00    Years: 55.00    Pack years: 55.00    Types: Cigarettes    Quit date: 03/20/2012    Years since quitting: 7.1  . Smokeless tobacco: Never Used  Substance Use Topics  . Alcohol use: Yes    Comment: Social  . Drug use: No     Colonoscopy:  PAP:  Bone density:  Lipid panel:  Allergies  Allergen Reactions  . Clopidogrel Rash and Hives  . Rosuvastatin Calcium Anaphylaxis  . Etodolac Nausea And Vomiting and Other (See Comments)    Bad dreams Bad dreams   . Hydrocodone Other (See Comments)  Bad dreams Bad dreams   . Other   . Statins   . Meloxicam Rash    Bad dreams Bad dreams   . Pravastatin Sodium Rash    Current Outpatient Medications  Medication Sig Dispense Refill  . acetaminophen (TYLENOL) 325 MG tablet Take 325 mg by mouth every 4 (four) hours as needed.     [name] amLODipine (NORVASC) 10 MG tablet TAKE 1 TABLET BY MOUTH EVERY DAY    . aspirin EC 81 MG tablet Take by mouth.    .  carvedilol (COREG) 6.25 MG tablet Take 1 tablet (6.25 mg total) by mouth 2 (two) times daily. 60 tablet 6  . clopidogrel (PLAVIX) 75 MG tablet Take 75 mg by mouth daily.    [name] ezetimibe (ZETIA) 10 MG tablet TAKE 1 TABLET BY MOUTH DAILY 90 tablet 0  . famotidine (PEPCID) 20 MG tablet Take 20 mg by mouth 2 (two) times daily.     . fentaNYL (DURAGESIC) 25 MCG/HR Place 1 patch onto the skin every 3 (three) days. 10 patch 0  . Garlic 161 MG TABS Take by mouth.     [name] HYDROcodone-acetaminophen (NORCO/VICODIN) 5-325 MG tablet Take 1 tablet by mouth every 6 (six) hours as needed for moderate pain. 120 tablet 0  . lidocaine-prilocaine (EMLA) cream Apply to affected area once 30 g 3  . magnesium hydroxide (MILK OF MAGNESIA) 400 MG/5ML suspension Take 30 mLs by mouth.    . megestrol (MEGACE) 20 MG tablet Take 1 tablet (20 mg total) by mouth daily. 30 tablet 1  . metoCLOPramide (REGLAN) 10 MG tablet Take 1 tablet (10 mg total) by mouth 4 (four) times daily. 30 tablet 1  . Multiple Vitamin (MULTI-VITAMINS) TABS Take by mouth.    . nitroGLYCERIN (NITROSTAT) 0.4 MG SL tablet Place under the tongue.    . Omega-3 Fatty Acids (FISH OIL OMEGA-3) 1000 MG CAPS Take by mouth daily.    . ondansetron (ZOFRAN) 8 MG tablet Take 1 tablet (8 mg total) by mouth every 8 (eight) hours as needed for nausea or vomiting. 30 tablet 3  . polyethylene glycol (MIRALAX / GLYCOLAX) 17 g packet Take 17 g by mouth daily.    . prochlorperazine (COMPAZINE) 10 MG tablet Take 1 tablet (10 mg total) by mouth every 6 (six) hours as needed for nausea or vomiting. 30 tablet 0  . sucralfate (CARAFATE) 1 g tablet Take 1 tablet (1 g total) by mouth 3 (three) times daily. 90 tablet 3  . ALPRAZolam (XANAX) 0.25 MG tablet      No current facility-administered medications for this visit.    Facility-Administered Medications Ordered in Other Visits  Medication Dose Route Frequency Provider Last Rate Last Dose  . dexamethasone (DECADRON) injection 10  mg  10 mg Intravenous Once [name], MD      . heparin lock flush 100 unit/mL  500 Units Intracatheter Once PRN [name], MD      . ondansetron [facility]) injection 8 mg  8 mg Intravenous Once [name], MD      . sodium chloride flush (NS) 0.9 % injection 10 mL  10 mL Intravenous PRN [name], MD   10 mL at 10/05/18 1026    OBJECTIVE: Vitals:   05/27/19 0832  BP: (!) 145/74  Pulse: 65  Temp: (!) 96.8 F (36 C)     Body mass index is 25.4 kg/m.    ECOG FS:0 - Asymptomatic   General: Well-developed, well-nourished, no  acute distress. Eyes: Pink conjunctiva, anicteric sclera. HEENT: Normocephalic, moist mucous membranes. Lungs: Clear to auscultation bilaterally. Heart: Regular rate and rhythm. No rubs, murmurs, or gallops. Abdomen: Soft, nontender, nondistended. No organomegaly noted, normoactive bowel sounds. Musculoskeletal: No edema, cyanosis, or clubbing. Neuro: Alert, answering all questions appropriately. Cranial nerves grossly intact. Skin: No rashes or petechiae noted. Psych: Normal affect.  LAB RESULTS:  Lab Results  Component Value Date   NA 137 05/27/2019   K 3.7 05/27/2019   CL 108 05/27/2019   CO2 22 05/27/2019   GLUCOSE 100 (H) 05/27/2019   BUN 16 05/27/2019   CREATININE 1.06 05/27/2019   CALCIUM 8.2 (L) 05/27/2019   PROT 6.9 05/27/2019   ALBUMIN 3.7 05/27/2019   AST 17 05/27/2019   ALT 15 05/27/2019   ALKPHOS 83 05/27/2019   BILITOT 0.5 05/27/2019   GFRNONAA >60 05/27/2019   GFRAA >60 05/27/2019    Lab Results  Component Value Date   WBC 3.6 (L) 05/27/2019   NEUTROABS 1.7 05/27/2019   HGB 9.9 (L) 05/27/2019   HCT 30.7 (L) 05/27/2019   MCV 98.7 05/27/2019   PLT 232 05/27/2019     STUDIES: Ct Abdomen Pelvis W Contrast  Result Date: 05/24/2019 CLINICAL DATA:  Recurrent urothelial carcinoma with metastatic disease. Undergoing radiation therapy. Worsening bilateral lower abdominal pain 4 weeks. Previous  right nephrectomy for renal cell carcinoma. EXAM: CT ABDOMEN AND PELVIS WITH CONTRAST TECHNIQUE: Multidetector CT imaging of the abdomen and pelvis was performed using the standard protocol following bolus administration of intravenous contrast. CONTRAST:  98mL OMNIPAQUE IOHEXOL 300 MG/ML  SOLN COMPARISON:  03/11/2019 FINDINGS: Lower Chest: Bibasilar pulmonary metastases are again seen, without significant interval change. Diffuse wall thickening of visualized portion of distal esophagus again seen, consistent with esophagitis. Hepatobiliary: Rim enhancing low-attenuation mass in the anterior hepatic dome has increased in size, currently measuring 4.0 x 3.7 cm compared to 2.3 x 2.2 cm previously. No other liver masses are identified. Tiny calcified gallstone is again seen, however there is no evidence of cholecystitis or biliary ductal dilatation. Pancreas:  No mass or inflammatory changes. Spleen: Within normal limits in size and appearance. Adrenals/Urinary Tract: Previous right nephrectomy. Stable tiny cyst in upper pole of left kidney. No evidence of left renal mass or hydronephrosis. Urinary bladder is nearly empty and not well visualized. Stomach/Bowel: No evidence of obstruction, inflammatory process or abnormal fluid collections. Diffuse colonic diverticulosis is again seen, however there is no evidence of diverticulitis. Normal appendix visualized. Vascular/Lymphatic: Mild retroperitoneal lymphadenopathy in the left para-aortic and aortocaval spaces remains stable, with index lymph node in the left paraaortic region measuring 1.6 cm on image 34/2. Mild right retrocrural lymphadenopathy is stable. Mild bilateral iliac lymphadenopathy is also stable, with largest index lymph node in the right external iliac chain measuring 1.6 cm. No new or increased lymphadenopathy identified. Reproductive: Normal size prostate gland. No masses identified. Penile prosthesis again noted. Other:  None. Musculoskeletal: Lytic  metastasis in L2 vertebral body is stable in size. Increased surrounding sclerosis is likely due to post treatment healing response. A new lytic lesion is seen in the anterior L3 vertebral body, also consistent with bone metastasis. IMPRESSION: 1. Increased size of mass in the anterior hepatic dome, consistent with hepatic metastasis. 2. Stable mild retroperitoneal and bilateral iliac lymphadenopathy, consistent with metastatic disease. 3. Stable bibasilar pulmonary metastases. 4. New lytic lesion in L3 vertebral body, consistent with bone metastasis. Stable size of lytic metastasis in L2 vertebral body. 5. Stable distal esophageal  wall thickening, consistent with esophagitis. 6. Cholelithiasis. No radiographic evidence of cholecystitis. 7. Colonic diverticulosis. No radiographic evidence of diverticulitis. Electronically Signed   By: Marlaine Hind M.D.   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  PDL 1 0%  PLAN:    1.  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  CT scan results from May 24, 2019 reviewed independently and reported as above with progression of disease particularly in his known hepatic metastasis.  Patient also has a new lytic lesion in his L3 vertebral body.  Plan to discontinue cisplatin and gemcitabine and proceed with second line therapy using Keytruda every 3 weeks.  Will also give a referral to interventional radiology for consideration of palliative embolization of his growing liver lesion.  Return to clinic in 1 week for further evaluation and consideration of cycle 1 of Keytruda.  2.  Genetic testing: Negative. 3.  Kidney function: Patient's creatinine continues to be within normal limits. 4.  Cardiac disease: Continue follow-up with cardiology as indicated. 5.   Back pain: MRI results from March 27, 2019 reviewed independently consistent with metastatic disease.  Patient has now completed XRT, but still has residual pain.  He has discontinued his fentanyl patch secondary to nausea, but continues oxycodone as needed.  Consider additional XRT to L3 vertebrae if needed. 6.  Constipation: Continue current bowel regimen and have recommended adding MiraLAX or magnesium citrate OTC. 7.  Bony metastasis: Zometa as above. 8.  Abdominal pain: Likely secondary to progressive disease.  Continue current narcotic regimen and referral to interventional radiology as above. 9.  Leukopenia: Improved. 10.  Anemia: Patient's hemoglobin is decreased, but essentially stable.  Monitor. 11.  Weakness and fatigue/nausea: Patient will receive IV fluids as well as IV Zofran and Decadron today.  Patient expressed understanding and was in agreement with this plan. He also understands that He can call clinic at any time with any questions, concerns, or complaints.   Cancer Staging Urothelial carcinoma of bladder [facility]) Staging form: Urinary Bladder, AJCC 8th Edition - Clinical: Stage IVA [place], cN2, cM1a) - Signed by [name], MD on 04/09/2018   [name], MD   05/27/2019 10:11 AM

## 2019-05-24 ENCOUNTER — Encounter: Payer: Self-pay | Admitting: Radiation Oncology

## 2019-05-24 ENCOUNTER — Ambulatory Visit
Admission: RE | Admit: 2019-05-24 | Discharge: 2019-05-24 | Disposition: A | Discharge status: Home / Self Care | Payer: Medicare Other | Source: Ambulatory Visit | Attending: Radiation Oncology | Admitting: Radiation Oncology

## 2019-05-24 ENCOUNTER — Ambulatory Visit
Admission: RE | Admit: 2019-05-24 | Discharge: 2019-05-24 | Disposition: A | Discharge status: Home / Self Care | Payer: Medicare Other | Source: Ambulatory Visit | Attending: Oncology | Admitting: Oncology

## 2019-05-24 ENCOUNTER — Other Ambulatory Visit: Payer: Self-pay

## 2019-05-24 VITALS — BP 125/65 | HR 69 | Resp 18 | Wt 184.7 lb

## 2019-05-24 DIAGNOSIS — C680 Malignant neoplasm of urethra: Secondary | ICD-10-CM | POA: Diagnosis not present

## 2019-05-24 DIAGNOSIS — C679 Malignant neoplasm of bladder, unspecified: Secondary | ICD-10-CM

## 2019-05-24 DIAGNOSIS — C771 Secondary and unspecified malignant neoplasm of intrathoracic lymph nodes: Secondary | ICD-10-CM | POA: Diagnosis not present

## 2019-05-24 DIAGNOSIS — C7951 Secondary malignant neoplasm of bone: Secondary | ICD-10-CM

## 2019-05-24 DIAGNOSIS — Z923 Personal history of irradiation: Secondary | ICD-10-CM | POA: Diagnosis not present

## 2019-05-24 MED ORDER — IOHEXOL 300 MG/ML  SOLN
85.0000 mL | Freq: Once | INTRAMUSCULAR | Status: AC | PRN
  Administered 2019-05-24: 11:00:00 85 mL via INTRAVENOUS

## 2019-05-24 NOTE — Progress Notes (Signed)
Radiation Oncology Follow up Note  Name: David Rich   Date:   05/24/2019 MRN:  [id number] DOB: [birth year]    This 73 y.o. 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  Seen today in routine follow-up he is doing fairly well.  He is currently off narcotic analgesics does still have some residual pain although markedly improved.  He is ambulating well without difficulty.  He is currently on gemcitabine and cisplatin palliatively and is currently on hold based on his blood counts.  COMPLICATIONS OF TREATMENT: none  FOLLOW UP COMPLIANCE: keeps appointments   PHYSICAL EXAM:  BP 125/65   Pulse 69   Resp 18   Wt 184 lb 11.2 oz (83.8 kg)   BMI 25.05 kg/m  No pain is elicited on deep palpation of his spine.  Motor or sensory and DTR levels are equal and symmetric in upper lower extremities.  Well-developed well-nourished patient in NAD. HEENT reveals PERLA, EOMI, discs not visualized.  Oral cavity is clear. No oral mucosal lesions are identified. Neck is clear without evidence of cervical or supraclavicular adenopathy. Lungs are clear to A&P. Cardiac examination is essentially unremarkable with regular rate and rhythm without murmur rub or thrill. Abdomen is benign with no organomegaly or masses noted. Motor sensory and DTR levels are equal and symmetric in the upper and lower extremities. Cranial nerves II through XII are grossly intact. Proprioception is intact. No peripheral adenopathy or edema is identified. No motor or sensory levels are noted. Crude visual fields are within normal range. RADIOLOGY RESULTS: No current films for review  PLAN: Patient is  achieved good palliation of pain in his spine from palliative radiation therapy.  I am pleased with his overall progress.  I will turn follow-up care over to medical oncology.  Be happy to reevaluate the patient at any time should further palliative treatment be indicated.  I would like to take this opportunity to thank you for allowing me to participate in the care of your patient.Noreene Filbert, MD

## 2019-05-26 ENCOUNTER — Other Ambulatory Visit: Payer: Self-pay

## 2019-05-27 ENCOUNTER — Other Ambulatory Visit: Payer: Self-pay | Admitting: Oncology

## 2019-05-27 ENCOUNTER — Other Ambulatory Visit: Payer: Self-pay

## 2019-05-27 ENCOUNTER — Inpatient Hospital Stay (HOSPITAL_BASED_OUTPATIENT_CLINIC_OR_DEPARTMENT_OTHER): Payer: Medicare Other | Admitting: Oncology

## 2019-05-27 ENCOUNTER — Encounter: Payer: Self-pay | Admitting: Oncology

## 2019-05-27 ENCOUNTER — Inpatient Hospital Stay: Payer: Medicare Other

## 2019-05-27 VITALS — Resp 18

## 2019-05-27 VITALS — BP 145/74 | HR 65 | Temp 96.8°F | Wt 187.3 lb

## 2019-05-27 DIAGNOSIS — C679 Malignant neoplasm of bladder, unspecified: Secondary | ICD-10-CM

## 2019-05-27 DIAGNOSIS — K769 Liver disease, unspecified: Secondary | ICD-10-CM

## 2019-05-27 DIAGNOSIS — I2510 Atherosclerotic heart disease of native coronary artery without angina pectoris: Secondary | ICD-10-CM | POA: Diagnosis not present

## 2019-05-27 LAB — CBC WITH DIFFERENTIAL/PLATELET
Abs Immature Granulocytes: 0.01 10*3/uL (ref 0.00–0.07)
Basophils Absolute: 0 10*3/uL (ref 0.0–0.1)
Basophils Relative: 1 %
Eosinophils Absolute: 0.1 10*3/uL (ref 0.0–0.5)
Eosinophils Relative: 3 %
HCT: 30.7 % — ABNORMAL LOW (ref 39.0–52.0)
Hemoglobin: 9.9 g/dL — ABNORMAL LOW (ref 13.0–17.0)
Immature Granulocytes: 0 %
Lymphocytes Relative: 29 %
Lymphs Abs: 1.1 10*3/uL (ref 0.7–4.0)
MCH: 31.8 pg (ref 26.0–34.0)
MCHC: 32.2 g/dL (ref 30.0–36.0)
MCV: 98.7 fL (ref 80.0–100.0)
Monocytes Absolute: 0.8 10*3/uL (ref 0.1–1.0)
Monocytes Relative: 21 %
Neutro Abs: 1.7 10*3/uL (ref 1.7–7.7)
Neutrophils Relative %: 46 %
Platelets: 232 10*3/uL (ref 150–400)
RBC: 3.11 MIL/uL — ABNORMAL LOW (ref 4.22–5.81)
RDW: 20 % — ABNORMAL HIGH (ref 11.5–15.5)
WBC: 3.6 10*3/uL — ABNORMAL LOW (ref 4.0–10.5)
nRBC: 0 % (ref 0.0–0.2)

## 2019-05-27 LAB — COMPREHENSIVE METABOLIC PANEL
ALT: 15 U/L (ref 0–44)
AST: 17 U/L (ref 15–41)
Albumin: 3.7 g/dL (ref 3.5–5.0)
Alkaline Phosphatase: 83 U/L (ref 38–126)
Anion gap: 7 (ref 5–15)
BUN: 16 mg/dL (ref 8–23)
CO2: 22 mmol/L (ref 22–32)
Calcium: 8.2 mg/dL — ABNORMAL LOW (ref 8.9–10.3)
Chloride: 108 mmol/L (ref 98–111)
Creatinine, Ser: 1.06 mg/dL (ref 0.61–1.24)
GFR calc Af Amer: >60 mL/min (ref >60)
GFR calc non Af Amer: >60 mL/min (ref >60)
Glucose, Bld: 100 mg/dL — ABNORMAL HIGH (ref 70–99)
Potassium: 3.7 mmol/L (ref 3.5–5.1)
Sodium: 137 mmol/L (ref 135–145)
Total Bilirubin: 0.5 mg/dL (ref 0.3–1.2)
Total Protein: 6.9 g/dL (ref 6.5–8.1)

## 2019-05-27 MED ORDER — SODIUM CHLORIDE 0.9 % IV SOLN: Freq: Once | INTRAVENOUS | Status: DC

## 2019-05-27 MED ORDER — SODIUM CHLORIDE 0.9 % IV SOLN
Freq: Once | INTRAVENOUS | Status: AC
  Administered 2019-05-27: 10:00:00 via INTRAVENOUS
  Filled 2019-05-27: qty 250

## 2019-05-27 MED ORDER — DEXAMETHASONE SODIUM PHOSPHATE 10 MG/ML IJ SOLN
10.0000 mg | Freq: Once | INTRAMUSCULAR | Status: AC
  Administered 2019-05-27: 11:00:00 10 mg via INTRAVENOUS
  Filled 2019-05-27: qty 1

## 2019-05-27 MED ORDER — ONDANSETRON HCL 4 MG/2ML IJ SOLN
8.0000 mg | Freq: Once | INTRAMUSCULAR | Status: AC
  Administered 2019-05-27: 10:00:00 8 mg via INTRAVENOUS
  Filled 2019-05-27: qty 4

## 2019-05-27 MED ORDER — SODIUM CHLORIDE 0.9% FLUSH
10.0000 mL | Freq: Once | INTRAVENOUS | Status: AC
  Administered 2019-05-27: 10 mL via INTRAVENOUS
  Filled 2019-05-27: qty 10

## 2019-05-27 MED ORDER — HEPARIN SOD (PORK) LOCK FLUSH 100 UNIT/ML IV SOLN
500.0000 [IU] | Freq: Once | INTRAVENOUS | Status: AC | PRN
  Administered 2019-05-27: 11:00:00 500 [IU]
  Filled 2019-05-27: qty 5

## 2019-05-27 NOTE — Progress Notes (Signed)
DISCONTINUE OFF PATHWAY REGIMEN - Bladder   [id number]:Gemcitabine + Cisplatin (split-dose cisplatin) q21 days:   A cycle is every 21 days:     Gemcitabine      Cisplatin   **Always confirm dose/schedule in your pharmacy ordering system**  REASON: Disease Progression PRIOR TREATMENT: Off Pathway: Gemcitabine + Cisplatin (split-dose cisplatin) q21 days TREATMENT RESPONSE: Progressive Disease (PD)  START ON PATHWAY REGIMEN - Bladder     A cycle is 21 days:     Pembrolizumab   **Always confirm dose/schedule in your pharmacy ordering system**  Patient Characteristics: Advanced/Metastatic Disease, Second Line, FGFR2/FGFR3 Mutation Negative or Unknown, Prior Platinum-Based Therapy and No Prior PD-1/PD-L1 Inhibitor Therapeutic Status: Advanced/Metastatic Disease Line of Therapy: Second Line FGFR2/FGFR3 Mutation Status: Did Not Order Test Intent of Therapy: Non-Curative / Palliative Intent, Discussed with Patient 

## 2019-05-27 NOTE — Progress Notes (Signed)
Pt here for follow up, reports increased abdominal discomfort, would like "it straightened out before going back on chemo"

## 2019-05-31 NOTE — Progress Notes (Signed)
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  INTERVAL HISTORY: Patient returns to clinic today for further evaluation and initiation of cycle 1 of palliative Keytruda.  He continues to have a poor appetite with increased nausea.  He also continues to have flank pain.  He admits to not taking his medication as prescribed.  He has no neurologic complaints.  He denies any recent fevers or illnesses.  He denies any chest pain, shortness of breath, cough, or hemoptysis.  He denies any constipation or diarrhea.  He has no urinary complaints.  Patient offers no further specific complaints today.  REVIEW OF SYSTEMS:   Review of Systems  Constitutional: Positive for malaise/fatigue. Negative for fever and weight loss.  Respiratory: Negative.  Negative for cough and shortness of breath.   Cardiovascular: Negative.  Negative for chest pain and leg swelling.  Gastrointestinal: Positive for abdominal pain and nausea. Negative for constipation, diarrhea and vomiting.  Genitourinary: Negative.  Negative for dysuria and hematuria.  Musculoskeletal: Positive for back pain.  Skin: Negative.  Negative for rash.  Neurological: Positive for weakness. Negative for sensory change, focal weakness and headaches.  Psychiatric/Behavioral: The patient has insomnia. The patient is not nervous/anxious.     As per HPI. Otherwise, a complete review of systems is negative.  PAST MEDICAL HISTORY: Past Medical History:  Diagnosis Date   Cancer [facility]) 2013   Right Renal    Hyperlipidemia    Hypertension    Renal  disorder    Urothelial carcinoma of bladder ([place]) 2020    PAST SURGICAL HISTORY: Past Surgical History:  Procedure Laterality Date   CORONARY STENT INTERVENTION N/A 09/28/2018   Procedure: CORONARY STENT INTERVENTION;  Surgeon: [name], MD;  Location: [place] [facility];  Service: Cardiovascular;  Laterality: N/A;   CORONARY STENT PLACEMENT     IR FLUORO GUIDE CV LINE RIGHT  04/03/2018   IR RADIOLOGIST EVAL & MGMT  06/01/2019   kidney removed Left 2013   LEFT HEART CATH AND CORONARY ANGIOGRAPHY N/A 09/28/2018   Procedure: LEFT HEART CATH AND CORONARY ANGIOGRAPHY;  Surgeon: [name], MD;  Location: [place] [facility];  Service: Cardiovascular;  Laterality: N/A;    FAMILY HISTORY: Family History  Problem Relation Age of Onset   Coronary artery disease Mother    Anemia Mother    Kidney failure Mother    Subarachnoid hemorrhage Father    Breast cancer Sister 50   Hypertension Brother     ADVANCED DIRECTIVES (Y/N):  N  HEALTH MAINTENANCE: Social History   Tobacco Use   Smoking status: Former Smoker    Packs/day: 1.00    Years: 55.00    Pack years: 55.00    Types: Cigarettes    Quit date: 03/20/2012    Years since quitting: 7.2   Smokeless tobacco: Never Used  Substance Use Topics   Alcohol use: Yes    Comment: Social   Drug use: No     Colonoscopy:  PAP:  Bone density:  Lipid panel:  Allergies  Allergen Reactions   Clopidogrel Rash and Hives   Rosuvastatin Calcium Anaphylaxis   Etodolac Nausea And Vomiting and  Other (See Comments)    Bad dreams Bad dreams    Hydrocodone Other (See Comments)    Bad dreams Bad dreams    Other    Statins    Meloxicam Rash    Bad dreams Bad dreams    Pravastatin Sodium Rash    Current Outpatient Medications  Medication Sig Dispense Refill   acetaminophen (TYLENOL) 325 MG tablet Take 325 mg by mouth every 4 (four) hours as needed.      ALPRAZolam (XANAX) 0.25 MG tablet       amLODipine (NORVASC) 10 MG tablet TAKE 1 TABLET BY MOUTH EVERY DAY     aspirin EC 81 MG tablet Take by mouth.     carvedilol (COREG) 6.25 MG tablet Take 1 tablet (6.25 mg total) by mouth 2 (two) times daily. 60 tablet 6   clopidogrel (PLAVIX) 75 MG tablet Take 75 mg by mouth daily.     ezetimibe (ZETIA) 10 MG tablet TAKE 1 TABLET BY MOUTH DAILY 90 tablet 0   famotidine (PEPCID) 20 MG tablet Take 20 mg by mouth 2 (two) times daily.      Garlic [id number] MG TABS Take by mouth.      HYDROcodone-acetaminophen (NORCO/VICODIN) 5-325 MG tablet Take 1 tablet by mouth every 6 (six) hours as needed for moderate pain. 120 tablet 0   nitroGLYCERIN (NITROSTAT) 0.4 MG SL tablet Place under the tongue.     ondansetron (ZOFRAN) 8 MG tablet Take 1 tablet (8 mg total) by mouth every 8 (eight) hours as needed for nausea or vomiting. 30 tablet 3   polyethylene glycol (MIRALAX / GLYCOLAX) 17 g packet Take 17 g by mouth daily.     prochlorperazine (COMPAZINE) 10 MG tablet Take 1 tablet (10 mg total) by mouth every 6 (six) hours as needed for nausea or vomiting. 30 tablet 0   fentaNYL (DURAGESIC) 25 MCG/HR Place 1 patch onto the skin every 3 (three) days. (Patient not taking: Reported on 06/03/2019) 10 patch 0   magnesium hydroxide (MILK OF MAGNESIA) 400 MG/5ML suspension Take 30 mLs by mouth.     megestrol (MEGACE) 20 MG tablet Take 1 tablet (20 mg total) by mouth daily. (Patient not taking: Reported on 06/03/2019) 30 tablet 1   metoCLOPramide (REGLAN) 10 MG tablet Take 1 tablet (10 mg total) by mouth 4 (four) times daily. (Patient not taking: Reported on 06/03/2019) 30 tablet 1   Multiple Vitamin (MULTI-VITAMINS) TABS Take by mouth.     Omega-3 Fatty Acids (FISH OIL OMEGA-3) 1000 MG CAPS Take by mouth daily.     sucralfate (CARAFATE) 1 g tablet Take 1 tablet (1 g total) by mouth 3 (three) times daily. (Patient not taking: Reported on 06/03/2019) 90 tablet 3   No current facility-administered medications  for this visit.    Facility-Administered Medications Ordered in Other Visits  Medication Dose Route Frequency Provider Last Rate Last Dose   heparin lock flush 100 unit/mL  500 Units Intracatheter Once PRN [name], MD       pembrolizumab [facility]) 200 mg in sodium chloride 0.9 % 50 mL chemo infusion  200 mg Intravenous Once [name], MD       sodium chloride flush (NS) 0.9 % injection 10 mL  10 mL Intravenous PRN [name], MD   10 mL at 10/05/18 1026    OBJECTIVE: Vitals:   06/03/19 0909  BP: 117/71  Pulse: 77  Resp: 18  Temp: 98 F (36.7 C)  Body mass index is 24.97 kg/m.    ECOG FS:0 - Asymptomatic   General: Well-developed, well-nourished, no acute distress. Eyes: Pink conjunctiva, anicteric sclera. HEENT: Normocephalic, moist mucous membranes. Lungs: Clear to auscultation bilaterally. Heart: Regular rate and rhythm. No rubs, murmurs, or gallops. Abdomen: Soft, nontender, nondistended. No organomegaly noted, normoactive bowel sounds. Musculoskeletal: No edema, cyanosis, or clubbing. Neuro: Alert, answering all questions appropriately. Cranial nerves grossly intact. Skin: No rashes or petechiae noted. Psych: Normal affect.  LAB RESULTS:  Lab Results  Component Value Date   NA 137 06/03/2019   K 3.7 06/03/2019   CL 107 06/03/2019   CO2 23 06/03/2019   GLUCOSE 135 (H) 06/03/2019   BUN 18 06/03/2019   CREATININE 1.07 06/03/2019   CALCIUM 8.6 (L) 06/03/2019   PROT 7.5 06/03/2019   ALBUMIN 4.0 06/03/2019   AST 18 06/03/2019   ALT 19 06/03/2019   ALKPHOS 90 06/03/2019   BILITOT 0.4 06/03/2019   GFRNONAA >60 06/03/2019   GFRAA >60 06/03/2019    Lab Results  Component Value Date   WBC 7.2 06/03/2019   NEUTROABS 5.2 06/03/2019   HGB 11.1 (L) 06/03/2019   HCT 34.2 (L) 06/03/2019   MCV 99.1 06/03/2019   PLT 237 06/03/2019     STUDIES: Ct Abdomen Pelvis W Contrast  Result Date: 05/24/2019 CLINICAL DATA:  Recurrent  urothelial carcinoma with metastatic disease. Undergoing radiation therapy. Worsening bilateral lower abdominal pain 4 weeks. Previous right nephrectomy for renal cell carcinoma. EXAM: CT ABDOMEN AND PELVIS WITH CONTRAST TECHNIQUE: Multidetector CT imaging of the abdomen and pelvis was performed using the standard protocol following bolus administration of intravenous contrast. CONTRAST:  73mL OMNIPAQUE IOHEXOL 300 MG/ML  SOLN COMPARISON:  03/11/2019 FINDINGS: Lower Chest: Bibasilar pulmonary metastases are again seen, without significant interval change. Diffuse wall thickening of visualized portion of distal esophagus again seen, consistent with esophagitis. Hepatobiliary: Rim enhancing low-attenuation mass in the anterior hepatic dome has increased in size, currently measuring 4.0 x 3.7 cm compared to 2.3 x 2.2 cm previously. No other liver masses are identified. Tiny calcified gallstone is again seen, however there is no evidence of cholecystitis or biliary ductal dilatation. Pancreas:  No mass or inflammatory changes. Spleen: Within normal limits in size and appearance. Adrenals/Urinary Tract: Previous right nephrectomy. Stable tiny cyst in upper pole of left kidney. No evidence of left renal mass or hydronephrosis. Urinary bladder is nearly empty and not well visualized. Stomach/Bowel: No evidence of obstruction, inflammatory process or abnormal fluid collections. Diffuse colonic diverticulosis is again seen, however there is no evidence of diverticulitis. Normal appendix visualized. Vascular/Lymphatic: Mild retroperitoneal lymphadenopathy in the left para-aortic and aortocaval spaces remains stable, with index lymph node in the left paraaortic region measuring 1.6 cm on image 34/2. Mild right retrocrural lymphadenopathy is stable. Mild bilateral iliac lymphadenopathy is also stable, with largest index lymph node in the right external iliac chain measuring 1.6 cm. No new or increased lymphadenopathy  identified. Reproductive: Normal size prostate gland. No masses identified. Penile prosthesis again noted. Other:  None. Musculoskeletal: Lytic metastasis in L2 vertebral body is stable in size. Increased surrounding sclerosis is likely due to post treatment healing response. A new lytic lesion is seen in the anterior L3 vertebral body, also consistent with bone metastasis. IMPRESSION: 1. Increased size of mass in the anterior hepatic dome, consistent with hepatic metastasis. 2. Stable mild retroperitoneal and bilateral iliac lymphadenopathy, consistent with metastatic disease. 3. Stable bibasilar pulmonary metastases. 4. New lytic lesion in L3 vertebral  body, consistent with bone metastasis. Stable size of lytic metastasis in L2 vertebral body. 5. Stable distal esophageal wall thickening, consistent with esophagitis. 6. Cholelithiasis. No radiographic evidence of cholecystitis. 7. Colonic diverticulosis. No radiographic evidence of diverticulitis. Electronically Signed   By: Marlaine Hind M.D.   On: 05/24/2019 12:10   Ir Radiologist Eval & Mgmt  Result Date: 06/01/2019 Please refer to notes tab for details about interventional procedure. 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  PDL 1 0%  PLAN:    1.  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  CT scan results from May 24, 2019 reviewed independently and reported as above with progression of disease particularly in his known hepatic metastasis.  Patient also has a new lytic lesion in his L3 vertebral body.  Plan to discontinue cisplatin and gemcitabine and proceed with second line therapy using Keytruda every 3 weeks.  Patient has also been evaluated by interventional radiology for consideration of palliative embolization is growing liver lesion.   Proceed with cycle 1 of Keytruda today.  Return to clinic in 3 weeks for further evaluation and consideration of cycle 2. 2.  Genetic testing: Negative. 3.  Kidney function: Patient's creatinine continues to be within normal limits. 4.  Cardiac disease: Continue follow-up with cardiology as indicated. 5.  Back pain: MRI results from March 27, 2019 reviewed independently consistent with metastatic disease.  Patient has now completed XRT, but still has residual pain.  He has discontinued his fentanyl patch secondary to nausea.  Patient now states he is only using 1/2 tablet of oxycodone.  He has been instructed to increase his dose to a full tablet at least at night to help his insomnia.  Continue follow-up with palliative care as needed.  6.  Constipation: Continue current bowel regimen and have recommended adding MiraLAX or magnesium citrate OTC. 7.  Bony metastasis: Z[place] as above. 8.  Abdominal pain: Likely secondary to progressive disease.  Continue current narcotic regimen and referral to interventional radiology as above. 9.  Leukopenia: Resolved. 10.  Anemia: Hemoglobin has improved to 11.1. 11.  Nausea: Patient has been instructed to take his medication scheduled rather than as needed.  Patient expressed understanding and was in agreement with this plan. He also understands that He can call clinic at any time with any questions, concerns, or complaints.   Cancer Staging Urothelial carcinoma of bladder [facility]) Staging form: Urinary Bladder, AJCC 8th Edition - Clinical: Stage IVA [place], cN2, cM1a) - Signed by [name], MD on 04/09/2018   [name], MD   06/03/2019 9:59 AM

## 2019-06-01 ENCOUNTER — Ambulatory Visit
Admission: RE | Admit: 2019-06-01 | Discharge: 2019-06-01 | Disposition: A | Discharge status: Home / Self Care | Payer: Medicare Other | Source: Ambulatory Visit | Attending: Oncology | Admitting: Oncology

## 2019-06-01 ENCOUNTER — Other Ambulatory Visit: Payer: Self-pay

## 2019-06-01 ENCOUNTER — Other Ambulatory Visit: Payer: Self-pay | Admitting: Interventional Radiology

## 2019-06-01 ENCOUNTER — Encounter: Payer: Self-pay | Admitting: *Deleted

## 2019-06-01 DIAGNOSIS — C679 Malignant neoplasm of bladder, unspecified: Secondary | ICD-10-CM

## 2019-06-01 DIAGNOSIS — K769 Liver disease, unspecified: Secondary | ICD-10-CM

## 2019-06-01 DIAGNOSIS — C787 Secondary malignant neoplasm of liver and intrahepatic bile duct: Secondary | ICD-10-CM

## 2019-06-01 HISTORY — PX: IR RADIOLOGIST EVAL & MGMT: IMG5224

## 2019-06-01 NOTE — Consult Note (Signed)
Chief Complaint: Enlarging symptomatic hepatic metastasis.  Referring Physician(s): Finnegan,Timothy J  History of Present Illness: David Rich is a 73 y.o. male with past medical history significant for hypertension, hyperlipidemia and stage IVa urethral carcinoma who is seen in telemedicine consultation for evaluation of rapidly enlarging symptomatic hepatic metastasis.  Patient states that he has been experiencing upper abdominal pain since approximately June of this year.  It is noted that an ill-defined hypoattenuating lesion was seen on CT scan of the abdomen pelvis performed 03/11/2019 and found to rapidly enlarge on subsequent surveillance CT scan performed 05/24/2019.  Patient states that his back pain attributable to osseous metastatic disease has improved since undergoing external radiation and which he now rates as only mild and currently does not require pain medication.  Patient is otherwise without complaint and interested in pursuing all available treatment options.  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    Past Surgical History:  Procedure Laterality Date   CORONARY STENT INTERVENTION N/A 09/28/2018   Procedure: CORONARY STENT INTERVENTION;  Surgeon: [name], MD;  Location: [facility];  Service: Cardiovascular;  Laterality: N/A;   CORONARY STENT PLACEMENT     IR FLUORO GUIDE CV LINE RIGHT  04/03/2018   kidney removed Left 2013   LEFT HEART CATH AND CORONARY ANGIOGRAPHY N/A 09/28/2018   Procedure: LEFT HEART CATH AND CORONARY ANGIOGRAPHY;  Surgeon: [name], MD;  Location: [facility];  Service: Cardiovascular;  Laterality: N/A;    Allergies: Clopidogrel, Rosuvastatin calcium, Etodolac, Hydrocodone, Other, Statins, Meloxicam, and Pravastatin sodium  Medications: Prior to  Admission medications   Medication Sig Start Date End Date Taking? Authorizing Provider  acetaminophen (TYLENOL) 325 MG tablet Take 325 mg by mouth every 4 (four) hours as needed.  02/18/18   [provider]  ALPRAZolam Duanne Moron) 0.25 MG tablet  05/18/19   [provider]  amLODipine (NORVASC) 10 MG tablet TAKE 1 TABLET BY MOUTH EVERY DAY 02/01/19   [provider]  aspirin EC 81 MG tablet Take by mouth.    [provider]  carvedilol (COREG) 6.25 MG tablet Take 1 tablet (6.25 mg total) by mouth 2 (two) times daily. 03/12/19   [name], MD  clopidogrel (PLAVIX) 75 MG tablet Take 75 mg by mouth daily. 11/02/18 11/02/19  [provider]  ezetimibe (ZETIA) 10 MG tablet TAKE 1 TABLET BY MOUTH DAILY 03/24/19   [name], MD  famotidine (PEPCID) 20 MG tablet Take 20 mg by mouth 2 (two) times daily.  03/03/19 03/02/20  [provider]  fentaNYL (DURAGESIC) 25 MCG/HR Place 1 patch onto the skin every 3 (three) days. 04/22/19   [name], MD  Garlic 123XX123 MG TABS Take by mouth.     [provider]  HYDROcodone-acetaminophen (NORCO/VICODIN) 5-325 MG tablet Take 1 tablet by mouth every 6 (six) hours as needed for moderate pain. 04/29/19   [name], MD  magnesium hydroxide (MILK OF MAGNESIA) 400 MG/5ML suspension Take 30 mLs by mouth.    [provider]  megestrol (MEGACE) 20 MG tablet Take 1 tablet (20 mg total) by mouth daily. 05/06/19   [name], MD  metoCLOPramide (REGLAN) 10 MG tablet Take 1 tablet (10 mg total) by mouth 4 (four) times daily.  05/20/19   [name], MD  Multiple Vitamin (MULTI-VITAMINS) TABS Take by mouth.    [provider]  nitroGLYCERIN (NITROSTAT) 0.4 MG SL tablet Place under the tongue. 10/05/18 10/05/19  [provider]  Omega-3 Fatty Acids (FISH OIL OMEGA-3) 1000 MG CAPS Take by mouth daily.    [provider]  ondansetron (ZOFRAN) 8 MG tablet  Take 1 tablet (8 mg total) by mouth every 8 (eight) hours as needed for nausea or vomiting. 04/06/19   [name], MD  polyethylene glycol (MIRALAX / GLYCOLAX) 17 g packet Take 17 g by mouth daily.    [provider]  prochlorperazine (COMPAZINE) 10 MG tablet Take 1 tablet (10 mg total) by mouth every 6 (six) hours as needed for nausea or vomiting. 04/29/19   [name], MD  sucralfate (CARAFATE) 1 g tablet Take 1 tablet (1 g total) by mouth 3 (three) times daily. 04/14/19   Noreene Filbert, MD     Family History  Problem Relation Age of Onset   Coronary artery disease Mother    Anemia Mother    Kidney failure Mother    Subarachnoid hemorrhage Father    Breast cancer Sister 54   Hypertension Brother     Social History   Socioeconomic History   Marital status: Divorced    Spouse name: Not on file   Number of children: Not on file   Years of education: Not on file   Highest education level: Not on file  Occupational History   Not on file  Social Needs   Financial resource strain: Not on file   Food insecurity    Worry: Not on file    Inability: Not on file   Transportation needs    Medical: Not on file    Non-medical: Not on file  Tobacco Use   Smoking status: Former Smoker    Packs/day: 1.00    Years: 55.00    Pack years: 55.00    Types: Cigarettes    Quit date: 03/20/2012    Years since quitting: 7.2   Smokeless tobacco: Never Used  Substance and Sexual Activity   Alcohol use: Yes    Comment: Social   Drug use: No   Sexual activity: Not on file  Lifestyle   Physical activity    Days per week: Not on file    Minutes per session: Not on file   Stress: Not on file  Relationships   Social connections    Talks on phone: Not on file    Gets together: Not on file    Attends religious service: Not on file    Active member of club or organization: Not on file    Attends meetings of clubs or organizations: Not on file     Relationship status: Not on file  Other Topics Concern   Not on file  Social History Narrative   Not on file    ECOG Status: 1 - Symptomatic but completely ambulatory  Review of Systems  Review of Systems: A 12 point ROS discussed and pertinent positives are indicated in the HPI above.  All other systems are negative.  Physical Exam No direct physical exam was performed (except for noted visual exam findings with Video Visits).   Vital Signs: There were no vitals taken for this visit.  Imaging:  Review of CT scan of the abdomen and pelvis performed 05/24/2019 demonstrates a now approximately 3.5 cm ill-defined hypoattenuating mass within the dome of the  right lobe of the liver, previously measuring 1.8 cm on CT scan of the abdomen pelvis performed 03/11/2019.    Additionally, there is a suspected adjacent satellite lesion with the more central aspect of the liver which measures approximately 1.8 cm (12, series 2).    Moderate to large amount of irregular mixed calcified and noncalcified atherosclerotic plaque is seen throughout the abdominal aorta and pelvic vasculature.  Conventional hepatic vasculature.  The dominant ill-defined hypoattenuating hepatic lesion appears supplied predominantly via a medial segmental branch of the left hepatic artery.  The portal vein appears patent.   Ct Abdomen Pelvis W Contrast  Result Date: 05/24/2019 CLINICAL DATA:  Recurrent urothelial carcinoma with metastatic disease. Undergoing radiation therapy. Worsening bilateral lower abdominal pain 4 weeks. Previous right nephrectomy for renal cell carcinoma. EXAM: CT ABDOMEN AND PELVIS WITH CONTRAST TECHNIQUE: Multidetector CT imaging of the abdomen and pelvis was performed using the standard protocol following bolus administration of intravenous contrast. CONTRAST:  16mL OMNIPAQUE IOHEXOL 300 MG/ML  SOLN COMPARISON:  03/11/2019 FINDINGS: Lower Chest: Bibasilar pulmonary metastases are again seen, without  significant interval change. Diffuse wall thickening of visualized portion of distal esophagus again seen, consistent with esophagitis. Hepatobiliary: Rim enhancing low-attenuation mass in the anterior hepatic dome has increased in size, currently measuring 4.0 x 3.7 cm compared to 2.3 x 2.2 cm previously. No other liver masses are identified. Tiny calcified gallstone is again seen, however there is no evidence of cholecystitis or biliary ductal dilatation. Pancreas:  No mass or inflammatory changes. Spleen: Within normal limits in size and appearance. Adrenals/Urinary Tract: Previous right nephrectomy. Stable tiny cyst in upper pole of left kidney. No evidence of left renal mass or hydronephrosis. Urinary bladder is nearly empty and not well visualized. Stomach/Bowel: No evidence of obstruction, inflammatory process or abnormal fluid collections. Diffuse colonic diverticulosis is again seen, however there is no evidence of diverticulitis. Normal appendix visualized. Vascular/Lymphatic: Mild retroperitoneal lymphadenopathy in the left para-aortic and aortocaval spaces remains stable, with index lymph node in the left paraaortic region measuring 1.6 cm on image 34/2. Mild right retrocrural lymphadenopathy is stable. Mild bilateral iliac lymphadenopathy is also stable, with largest index lymph node in the right external iliac chain measuring 1.6 cm. No new or increased lymphadenopathy identified. Reproductive: Normal size prostate gland. No masses identified. Penile prosthesis again noted. Other:  None. Musculoskeletal: Lytic metastasis in L2 vertebral body is stable in size. Increased surrounding sclerosis is likely due to post treatment healing response. A new lytic lesion is seen in the anterior L3 vertebral body, also consistent with bone metastasis. IMPRESSION: 1. Increased size of mass in the anterior hepatic dome, consistent with hepatic metastasis. 2. Stable mild retroperitoneal and bilateral iliac  lymphadenopathy, consistent with metastatic disease. 3. Stable bibasilar pulmonary metastases. 4. New lytic lesion in L3 vertebral body, consistent with bone metastasis. Stable size of lytic metastasis in L2 vertebral body. 5. Stable distal esophageal wall thickening, consistent with esophagitis. 6. Cholelithiasis. No radiographic evidence of cholecystitis. 7. Colonic diverticulosis. No radiographic evidence of diverticulitis. Electronically Signed   By: Marlaine Hind M.D.   On: 05/24/2019 12:10    Labs:  CBC: Recent Labs    04/29/19 0806 05/06/19 0827 05/20/19 0820 05/27/19 0814  WBC 4.8 3.1* 2.0* 3.6*  HGB 11.6* 10.8* 9.2* 9.9*  HCT 35.2* 33.1* 27.9* 30.7*  PLT 270 134* 81* 232    COAGS: Recent Labs    09/27/18 0047  INR 1.02  APTT >160*    BMP: Recent Labs  04/29/19 0806 05/06/19 0827 05/20/19 0820 05/27/19 0814  NA 137 134* 137 137  K 3.6 3.8 3.9 3.7  CL 106 104 108 108  CO2 23 22 21* 22  GLUCOSE 128* 137* 124* 100*  BUN 13 15 12 16   CALCIUM 8.8* 8.9 8.2* 8.2*  CREATININE 1.18 1.07 1.06 1.06  GFRNONAA >60 >60 >60 >60  GFRAA >60 >60 >60 >60    LIVER FUNCTION TESTS: Recent Labs    04/29/19 0806 05/06/19 0827 05/20/19 0820 05/27/19 0814  BILITOT 0.5 0.4 0.4 0.5  AST 16 19 19 17   ALT 14 21 21 15   ALKPHOS 84 82 82 83  PROT 7.1 7.1 6.4* 6.9  ALBUMIN 3.9 3.8 3.8 3.7    TUMOR MARKERS: No results for input(s): AFPTM, CEA, CA199, CHROMGRNA in the last 8760 hours.  Assessment and Plan:  David Rich is a 73 y.o. male with past medical history significant for hypertension, hyperlipidemia and stage IVa urethral carcinoma who is seen in telemedicine consultation for evaluation of rapidly enlarging symptomatic hepatic metastasis.  Imaging review demonstrates the following: - Review of CT scan of the abdomen and pelvis performed 05/24/2019 demonstrates a now approximately 3.5 cm ill-defined hypoattenuating mass within the dome of the right lobe of the liver,  previously measuring 1.8 cm on CT scan of the abdomen pelvis performed 03/11/2019.   - Additionally, there is a suspected adjacent ill-defined satellite lesion with the more central aspect of the liver which measures approximately 1.8 cm (12, series 2).   - Moderate to large amount of irregular mixed calcified and noncalcified atherosclerotic plaque is seen throughout the abdominal aorta and pelvic vasculature.  Conventional hepatic vasculature.  The dominant ill-defined hypoattenuating hepatic lesion appears supplied predominantly via a medial segmental branch of the left hepatic artery.  The portal vein appears patent.  Given size of rapidly enlarging hepatic metastasis (currently estimated to measure approximately 3.5 cm), I do not feel the patient is a candidate for percutaneous microwave ablation as the lesion is measuring greater than 3 cm thereby increasing likelihood of incomplete ablation.  Additionally, I am concerned that there is an additional lesion with more central aspect of the right lobe liver.  As such, I will first pursue the acquisition of an abdominal MRI to better delineate the extent of the hepatic metastatic disease.  Assuming there are only two lesions within the liver and the patient is not a candidate for ablation, I feel the patient would be a candidate for Y 90 radioembolization (Note, the patient's bilirubin and creatinine are both normal.)  As such, conversations were held with the patient regarding the benefits and risks (including but not limited to radiation and contrast exposure, bleeding, vessel injury and nontarget embolization) of Y 90 radioembolization.  I explained that this procedure would be performed for palliative purposes and is not curative.  I explained that while typically Y 90 radioembolization is not associated with pain, I am concerned the dominant lesion within the dome of the liver is causing some symptoms due to capsular involvement and rapid growth and as  such, the patient's symptomatology could acutely worsen following the embolization though my hope is that the embolization would ultimately result in reduction of the size of the lesion and associated symptomatology.  Following this prolonged and detailed conversation, the patient wishes to pursue evaluation for Y 90 radioembolization.  Plan:  1.  Abdominal MRI to evaluate size, number and extent of hepatic metastatic disease.  Following the acquisition of abdominal  MRI, an additional telemedicine consultation will be arranged to finalize treatment plans.   Ultimately, if Y 90 radioembolization is pursued, the procedures will be performed at [facility] on outpatient basis with conscious sedation.  The procedure will entail a mapping procedure as well as at least one radioembolization session.    The patient knows to call the interventional radiology clinic with any interval questions or concerns.  Thank you for this interesting consult.  I greatly enjoyed meeting David Rich and look forward to participating in their care.  A copy of this report was sent to the requesting provider on this date.  Electronically Signed: Sandi Mariscal 06/01/2019, 9:35 AM   I spent a total of 30 Minutes in remote  clinical consultation, greater than 50% of which was counseling/coordinating care for symptomatic hepatic metastatic disease.    Visit type: Audio only (telephone). Audio (no video) only due to patient's lack of internet/smartphone capability. Alternative for in-person consultation at [facility], [address], [place], [place]. This visit type was conducted due to national recommendations for restrictions regarding the COVID-19 Pandemic (e.g. social distancing).  This format is felt to be most appropriate for this patient at this time.  All issues noted in this document were discussed and addressed.

## 2019-06-02 ENCOUNTER — Other Ambulatory Visit: Payer: Self-pay | Admitting: Interventional Radiology

## 2019-06-02 ENCOUNTER — Other Ambulatory Visit: Payer: Self-pay

## 2019-06-02 DIAGNOSIS — C787 Secondary malignant neoplasm of liver and intrahepatic bile duct: Secondary | ICD-10-CM

## 2019-06-03 ENCOUNTER — Ambulatory Visit
Admission: RE | Admit: 2019-06-03 | Discharge: 2019-06-03 | Disposition: A | Discharge status: Home / Self Care | Payer: Medicare Other | Source: Ambulatory Visit | Attending: Interventional Radiology | Admitting: Interventional Radiology

## 2019-06-03 ENCOUNTER — Inpatient Hospital Stay: Payer: Medicare Other

## 2019-06-03 ENCOUNTER — Inpatient Hospital Stay (HOSPITAL_BASED_OUTPATIENT_CLINIC_OR_DEPARTMENT_OTHER): Payer: Medicare Other | Admitting: Hospice and Palliative Medicine

## 2019-06-03 ENCOUNTER — Inpatient Hospital Stay (HOSPITAL_BASED_OUTPATIENT_CLINIC_OR_DEPARTMENT_OTHER): Payer: Medicare Other | Admitting: Oncology

## 2019-06-03 ENCOUNTER — Other Ambulatory Visit: Payer: Self-pay

## 2019-06-03 ENCOUNTER — Encounter: Payer: Self-pay | Admitting: Oncology

## 2019-06-03 VITALS — BP 117/71 | HR 77 | Temp 98.0°F | Resp 18 | Wt 184.1 lb

## 2019-06-03 DIAGNOSIS — Z7189 Other specified counseling: Secondary | ICD-10-CM | POA: Diagnosis not present

## 2019-06-03 DIAGNOSIS — C787 Secondary malignant neoplasm of liver and intrahepatic bile duct: Secondary | ICD-10-CM | POA: Insufficient documentation

## 2019-06-03 DIAGNOSIS — I2510 Atherosclerotic heart disease of native coronary artery without angina pectoris: Secondary | ICD-10-CM

## 2019-06-03 DIAGNOSIS — Z515 Encounter for palliative care: Secondary | ICD-10-CM | POA: Diagnosis not present

## 2019-06-03 DIAGNOSIS — C679 Malignant neoplasm of bladder, unspecified: Secondary | ICD-10-CM

## 2019-06-03 LAB — CBC WITH DIFFERENTIAL/PLATELET
Abs Immature Granulocytes: 0.03 10*3/uL (ref 0.00–0.07)
Basophils Absolute: 0 10*3/uL (ref 0.0–0.1)
Basophils Relative: 0 %
Eosinophils Absolute: 0 10*3/uL (ref 0.0–0.5)
Eosinophils Relative: 0 %
HCT: 34.2 % — ABNORMAL LOW (ref 39.0–52.0)
Hemoglobin: 11.1 g/dL — ABNORMAL LOW (ref 13.0–17.0)
Immature Granulocytes: 0 %
Lymphocytes Relative: 15 %
Lymphs Abs: 1.1 10*3/uL (ref 0.7–4.0)
MCH: 32.2 pg (ref 26.0–34.0)
MCHC: 32.5 g/dL (ref 30.0–36.0)
MCV: 99.1 fL (ref 80.0–100.0)
Monocytes Absolute: 0.9 10*3/uL (ref 0.1–1.0)
Monocytes Relative: 12 %
Neutro Abs: 5.2 10*3/uL (ref 1.7–7.7)
Neutrophils Relative %: 73 %
Platelets: 237 10*3/uL (ref 150–400)
RBC: 3.45 MIL/uL — ABNORMAL LOW (ref 4.22–5.81)
RDW: 20.4 % — ABNORMAL HIGH (ref 11.5–15.5)
WBC: 7.2 10*3/uL (ref 4.0–10.5)
nRBC: 0 % (ref 0.0–0.2)

## 2019-06-03 LAB — COMPREHENSIVE METABOLIC PANEL
ALT: 19 U/L (ref 0–44)
AST: 18 U/L (ref 15–41)
Albumin: 4 g/dL (ref 3.5–5.0)
Alkaline Phosphatase: 90 U/L (ref 38–126)
Anion gap: 7 (ref 5–15)
BUN: 18 mg/dL (ref 8–23)
CO2: 23 mmol/L (ref 22–32)
Calcium: 8.6 mg/dL — ABNORMAL LOW (ref 8.9–10.3)
Chloride: 107 mmol/L (ref 98–111)
Creatinine, Ser: 1.07 mg/dL (ref 0.61–1.24)
GFR calc Af Amer: >60 mL/min (ref >60)
GFR calc non Af Amer: >60 mL/min (ref >60)
Glucose, Bld: 135 mg/dL — ABNORMAL HIGH (ref 70–99)
Potassium: 3.7 mmol/L (ref 3.5–5.1)
Sodium: 137 mmol/L (ref 135–145)
Total Bilirubin: 0.4 mg/dL (ref 0.3–1.2)
Total Protein: 7.5 g/dL (ref 6.5–8.1)

## 2019-06-03 MED ORDER — SODIUM CHLORIDE 0.9 % IV SOLN
Freq: Once | INTRAVENOUS | Status: AC
  Administered 2019-06-03: 10:00:00 via INTRAVENOUS
  Filled 2019-06-03: qty 250

## 2019-06-03 MED ORDER — SODIUM CHLORIDE 0.9% FLUSH
10.0000 mL | Freq: Once | INTRAVENOUS | Status: AC
  Administered 2019-06-03: 10 mL via INTRAVENOUS
  Filled 2019-06-03: qty 10

## 2019-06-03 MED ORDER — GADOBUTROL 1 MMOL/ML IV SOLN
8.0000 mL | Freq: Once | INTRAVENOUS | Status: AC | PRN
  Administered 2019-06-03: 19:00:00 8 mL via INTRAVENOUS

## 2019-06-03 MED ORDER — SODIUM CHLORIDE 0.9 % IV SOLN
200.0000 mg | Freq: Once | INTRAVENOUS | Status: AC
  Administered 2019-06-03: 10:00:00 200 mg via INTRAVENOUS
  Filled 2019-06-03: qty 8

## 2019-06-03 MED ORDER — HEPARIN SOD (PORK) LOCK FLUSH 100 UNIT/ML IV SOLN
500.0000 [IU] | Freq: Once | INTRAVENOUS | Status: AC | PRN
  Administered 2019-06-03: 500 [IU]
  Filled 2019-06-03: qty 5

## 2019-06-03 NOTE — Progress Notes (Signed)
[place]  [phone] [phone]   Name: David Rich Date: 06/03/2019 MRN: [id number]  DOB: 1946-03-24  Patient Care Team: Baxter Hire, MD as PCP - General (Internal Medicine) [name], MD as PCP - Cardiology (Cardiology) [name], MD as Consulting Physician (Oncology)    REASON FOR CONSULTATION: Palliative Care consult requested for this 73 y.o. 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  Patient has had difficulty with nausea and pain.  He was referred to palliative care to help address goals and manage ongoing symptoms.   SOCIAL HISTORY:     reports that he quit smoking about 7 years ago. His smoking use included cigarettes. He has a 55.00 pack-year smoking history. He has never used smokeless tobacco. He reports current alcohol use. He reports that he does not use drugs.  Patient is divorced.  He lives at home alone.  He has a son and daughter, both of whom live in [place].  Patient formally worked in a Civil engineer, contracting.  ADVANCE DIRECTIVES:  Does not have  CODE STATUS:   PAST MEDICAL HISTORY: Past Medical History:  Diagnosis Date   Cancer ([place]) 2013   Right Renal    Hyperlipidemia    Hypertension    Renal disorder    Urothelial carcinoma of bladder ([place]) 2020    PAST SURGICAL HISTORY:  Past Surgical History:  Procedure Laterality Date   CORONARY STENT INTERVENTION N/A 09/28/2018   Procedure: CORONARY STENT INTERVENTION;  Surgeon: [name], MD;  Location: [facility];  Service: Cardiovascular;  Laterality: N/A;   CORONARY STENT PLACEMENT     IR FLUORO GUIDE CV LINE RIGHT  04/03/2018   IR RADIOLOGIST EVAL & MGMT  06/01/2019   kidney removed Left 2013   LEFT HEART CATH AND CORONARY ANGIOGRAPHY N/A  09/28/2018   Procedure: LEFT HEART CATH AND CORONARY ANGIOGRAPHY;  Surgeon: [name], MD;  Location: [facility];  Service: Cardiovascular;  Laterality: N/A;    HEMATOLOGY/ONCOLOGY HISTORY:  Oncology History  Urothelial carcinoma of bladder ([place])  03/23/2018 Initial Diagnosis   Urothelial carcinoma of bladder ([place])   03/26/2018 Cancer Staging   Staging form: Urinary Bladder, AJCC 8th Edition - Clinical: Stage IVA (cT4a, cN2, cM1a) - Signed by [name], MD on 04/09/2018   04/08/2018 - 05/19/2019 Chemotherapy   The patient had palonosetron (ALOXI) injection 0.25 mg, 0.25 mg, Intravenous,  Once, 5 of 7 cycles Administration: 0.25 mg (04/08/2018), 0.25 mg (04/15/2018), 0.25 mg (04/29/2018), 0.25 mg (05/06/2018), 0.25 mg (05/20/2018), 0.25 mg (05/27/2018), 0.25 mg (03/25/2019), 0.25 mg (04/08/2019), 0.25 mg (04/29/2019), 0.25 mg (05/06/2019) CISplatin (PLATINOL) 74 mg in sodium chloride 0.9 % 250 mL chemo infusion, 35 mg/m2 = 74 mg (100 % of original dose 35 mg/m2), Intravenous,  Once, 5 of 7 cycles Dose modification: 35 mg/m2 (original dose 35 mg/m2, Cycle 1, Reason: Other (see comments)) Administration: 74 mg (04/08/2018), 74 mg (04/15/2018), 74 mg (04/29/2018), 74 mg (05/06/2018), 74 mg (05/20/2018), 74 mg (05/27/2018), 74 mg (03/25/2019), 74 mg (04/08/2019), 74 mg (04/29/2019), 74 mg (05/06/2019) gemcitabine (GEMZAR) 2,200 mg in sodium chloride 0.9 % 250 mL chemo infusion, 2,128 mg, Intravenous,  Once, 5 of 7 cycles Dose modification: 800 mg/m2 (original dose 1,000 mg/m2, Cycle 3, Reason: Dose not tolerated) Administration: 2,200 mg (04/08/2018), 2,200 mg (04/15/2018), 2,200 mg (04/29/2018), 2,000  mg (05/06/2018), 1,600 mg (05/20/2018), 1,600 mg (05/27/2018), 1,600 mg (03/25/2019), 1,600 mg (04/08/2019), 1,600 mg (04/29/2019), 1,600 mg (05/06/2019) fosaprepitant (EMEND) 150 mg, dexamethasone (DECADRON) 12 mg in sodium chloride 0.9 % 145 mL IVPB, , Intravenous,  Once, 5 of 7 cycles Administration:   (04/08/2018),  (04/15/2018),  (04/29/2018),  (05/06/2018),  (05/20/2018),  (05/27/2018),  (03/25/2019),  (04/08/2019),  (04/29/2019),  (05/06/2019)  for chemotherapy treatment.    06/03/2019 -  Chemotherapy   The patient had pembrolizumab (KEYTRUDA) 200 mg in sodium chloride 0.9 % 50 mL chemo infusion, 200 mg, Intravenous, Once, 1 of 6 cycles Administration: 200 mg (06/03/2019)  for chemotherapy treatment.      ALLERGIES:  is allergic to clopidogrel; rosuvastatin calcium; etodolac; hydrocodone; other; statins; meloxicam; and pravastatin sodium.  MEDICATIONS:  Current Outpatient Medications  Medication Sig Dispense Refill   acetaminophen (TYLENOL) 325 MG tablet Take 325 mg by mouth every 4 (four) hours as needed.      ALPRAZolam (XANAX) 0.25 MG tablet      amLODipine (NORVASC) 10 MG tablet TAKE 1 TABLET BY MOUTH EVERY DAY     aspirin EC 81 MG tablet Take by mouth.     carvedilol (COREG) 6.25 MG tablet Take 1 tablet (6.25 mg total) by mouth 2 (two) times daily. 60 tablet 6   clopidogrel (PLAVIX) 75 MG tablet Take 75 mg by mouth daily.     ezetimibe (ZETIA) 10 MG tablet TAKE 1 TABLET BY MOUTH DAILY 90 tablet 0   famotidine (PEPCID) 20 MG tablet Take 20 mg by mouth 2 (two) times daily.      fentaNYL (DURAGESIC) 25 MCG/HR Place 1 patch onto the skin every 3 (three) days. (Patient not taking: Reported on 06/03/2019) 10 patch 0   Garlic 027 MG TABS Take by mouth.      HYDROcodone-acetaminophen (NORCO/VICODIN) 5-325 MG tablet Take 1 tablet by mouth every 6 (six) hours as needed for moderate pain. 120 tablet 0   magnesium hydroxide (MILK OF MAGNESIA) 400 MG/5ML suspension Take 30 mLs by mouth.     megestrol (MEGACE) 20 MG tablet Take 1 tablet (20 mg total) by mouth daily. (Patient not taking: Reported on 06/03/2019) 30 tablet 1   metoCLOPramide (REGLAN) 10 MG tablet Take 1 tablet (10 mg total) by mouth 4 (four) times daily. (Patient not taking: Reported on 06/03/2019) 30 tablet 1   Multiple Vitamin  (MULTI-VITAMINS) TABS Take by mouth.     nitroGLYCERIN (NITROSTAT) 0.4 MG SL tablet Place under the tongue.     Omega-3 Fatty Acids (FISH OIL OMEGA-3) 1000 MG CAPS Take by mouth daily.     ondansetron (ZOFRAN) 8 MG tablet Take 1 tablet (8 mg total) by mouth every 8 (eight) hours as needed for nausea or vomiting. 30 tablet 3   polyethylene glycol (MIRALAX / GLYCOLAX) 17 g packet Take 17 g by mouth daily.     prochlorperazine (COMPAZINE) 10 MG tablet Take 1 tablet (10 mg total) by mouth every 6 (six) hours as needed for nausea or vomiting. 30 tablet 0   sucralfate (CARAFATE) 1 g tablet Take 1 tablet (1 g total) by mouth 3 (three) times daily. (Patient not taking: Reported on 06/03/2019) 90 tablet 3   No current facility-administered medications for this visit.    Facility-Administered Medications Ordered in Other Visits  Medication Dose Route Frequency Provider Last Rate Last Dose   sodium chloride flush (NS) 0.9 % injection 10 mL  10 mL Intravenous PRN Grayland Ormond [name], MD  10 mL at 10/05/18 1026    VITAL SIGNS: There were no vitals taken for this visit. There were no vitals filed for this visit.  Estimated body mass index is 24.97 kg/m as calculated from the following:   Height as of 04/29/19: 6' (1.829 m).   Weight as of an earlier encounter on 06/03/19: 184 lb 1.6 oz (83.5 kg).  LABS: CBC:    Component Value Date/Time   WBC 7.2 06/03/2019 0848   HGB 11.1 (L) 06/03/2019 0848   HCT 34.2 (L) 06/03/2019 0848   PLT 237 06/03/2019 0848   MCV 99.1 06/03/2019 0848   NEUTROABS 5.2 06/03/2019 0848   LYMPHSABS 1.1 06/03/2019 0848   MONOABS 0.9 06/03/2019 0848   EOSABS 0.0 06/03/2019 0848   BASOSABS 0.0 06/03/2019 0848   Comprehensive Metabolic Panel:    Component Value Date/Time   NA 137 06/03/2019 0848   NA 139 03/23/2012 1029   K 3.7 06/03/2019 0848   K 4.1 03/23/2012 1029   CL 107 06/03/2019 0848   CL 108 (H) 03/23/2012 1029   CO2 23 06/03/2019 0848   CO2 25  03/23/2012 1029   BUN 18 06/03/2019 0848   BUN 16 03/23/2012 1029   CREATININE 1.07 06/03/2019 0848   CREATININE 1.27 03/23/2012 1029   GLUCOSE 135 (H) 06/03/2019 0848   GLUCOSE 83 03/23/2012 1029   CALCIUM 8.6 (L) 06/03/2019 0848   CALCIUM 8.8 03/23/2012 1029   AST 18 06/03/2019 0848   ALT 19 06/03/2019 0848   ALKPHOS 90 06/03/2019 0848   BILITOT 0.4 06/03/2019 0848   PROT 7.5 06/03/2019 0848   ALBUMIN 4.0 06/03/2019 0848    RADIOGRAPHIC STUDIES: Ct Abdomen Pelvis W Contrast  Result Date: 05/24/2019 CLINICAL DATA:  Recurrent urothelial carcinoma with metastatic disease. Undergoing radiation therapy. Worsening bilateral lower abdominal pain 4 weeks. Previous right nephrectomy for renal cell carcinoma. EXAM: CT ABDOMEN AND PELVIS WITH CONTRAST TECHNIQUE: Multidetector CT imaging of the abdomen and pelvis was performed using the standard protocol following bolus administration of intravenous contrast. CONTRAST:  110m OMNIPAQUE IOHEXOL 300 MG/ML  SOLN COMPARISON:  03/11/2019 FINDINGS: Lower Chest: Bibasilar pulmonary metastases are again seen, without significant interval change. Diffuse wall thickening of visualized portion of distal esophagus again seen, consistent with esophagitis. Hepatobiliary: Rim enhancing low-attenuation mass in the anterior hepatic dome has increased in size, currently measuring 4.0 x 3.7 cm compared to 2.3 x 2.2 cm previously. No other liver masses are identified. Tiny calcified gallstone is again seen, however there is no evidence of cholecystitis or biliary ductal dilatation. Pancreas:  No mass or inflammatory changes. Spleen: Within normal limits in size and appearance. Adrenals/Urinary Tract: Previous right nephrectomy. Stable tiny cyst in upper pole of left kidney. No evidence of left renal mass or hydronephrosis. Urinary bladder is nearly empty and not well visualized. Stomach/Bowel: No evidence of obstruction, inflammatory process or abnormal fluid collections.  Diffuse colonic diverticulosis is again seen, however there is no evidence of diverticulitis. Normal appendix visualized. Vascular/Lymphatic: Mild retroperitoneal lymphadenopathy in the left para-aortic and aortocaval spaces remains stable, with index lymph node in the left paraaortic region measuring 1.6 cm on image 34/2. Mild right retrocrural lymphadenopathy is stable. Mild bilateral iliac lymphadenopathy is also stable, with largest index lymph node in the right external iliac chain measuring 1.6 cm. No new or increased lymphadenopathy identified. Reproductive: Normal size prostate gland. No masses identified. Penile prosthesis again noted. Other:  None. Musculoskeletal: Lytic metastasis in L2 vertebral body is stable in size.  Increased surrounding sclerosis is likely due to post treatment healing response. A new lytic lesion is seen in the anterior L3 vertebral body, also consistent with bone metastasis. IMPRESSION: 1. Increased size of mass in the anterior hepatic dome, consistent with hepatic metastasis. 2. Stable mild retroperitoneal and bilateral iliac lymphadenopathy, consistent with metastatic disease. 3. Stable bibasilar pulmonary metastases. 4. New lytic lesion in L3 vertebral body, consistent with bone metastasis. Stable size of lytic metastasis in L2 vertebral body. 5. Stable distal esophageal wall thickening, consistent with esophagitis. 6. Cholelithiasis. No radiographic evidence of cholecystitis. 7. Colonic diverticulosis. No radiographic evidence of diverticulitis. Electronically Signed   By: Marlaine Hind M.D.   On: 05/24/2019 12:10   Ir Radiologist Eval & Mgmt  Result Date: 06/01/2019 Please refer to notes tab for details about interventional procedure. (Op Note)   PERFORMANCE STATUS (ECOG) : 1 - Symptomatic but completely ambulatory  Review of Systems Unless otherwise noted, a complete review of systems is negative.  Physical Exam General: NAD, frail appearing, thin Pulmonary:  Unlabored Extremities: no edema Skin: no rashes Neurological: Weakness but otherwise nonfocal  IMPRESSION: I met with patient today in the infusion area.  Introduced palliative care services and attempted to establish therapeutic rapport.  Patient says that he is recently had persistent nausea and pain.  However, he admits to not regularly taking either his analgesics or antiemetics.  When he is taking his Norco he is only utilizing 1/2 tablet.  Pain is reportedly worse at night and is inhibiting his sleep.  I reinforced the plan outlined by Dr. Grayland Ormond and encouraged patient to try scheduled antiemetics and regular dosing of his Norco.  Patient says that he lives at home alone and is functionally independent with his care.  He is divorced but has social support from his 2 children.  His daughter is a Marine scientist.  I offered to call his daughter but patient declined.  I reviewed with patient a MOST Form and encouraged him to discuss decisions with his daughter.  PLAN: -Continue current scope of treatment -Recommend scheduled Zofran every 6 hours -Norco PRN -Prophylactic bowel regimen -MOST Form reviewed   Patient expressed understanding and was in agreement with this plan. He also understands that He can call the clinic at any time with any questions, concerns, or complaints.     Time Total: 30 minutes  Visit consisted of counseling and education dealing with the complex and emotionally intense issues of symptom management and palliative care in the setting of serious and potentially life-threatening illness.Greater than 50%  of this time was spent counseling and coordinating care related to the above assessment and plan.  Signed by: Altha Harm, PhD, NP-C [phone] (Work Cell)

## 2019-06-03 NOTE — Progress Notes (Signed)
Patient reports pain is 4/10 today and is taking Hydrocodone 1/2 tab about every 4-5 hours.  He was not able to tolerate the Fentanyl.  Has nausea and only takes 1 medication (not sure which one) that doesn't seem to help.

## 2019-06-04 LAB — THYROID PANEL WITH TSH
Free Thyroxine Index: 2.2 (ref 1.2–4.9)
T3 Uptake Ratio: 28 % (ref 24–39)
T4, Total: 7.9 ug/dL (ref 4.5–12.0)
TSH: 4.56 u[IU]/mL — ABNORMAL HIGH (ref 0.450–4.500)

## 2019-06-09 ENCOUNTER — Other Ambulatory Visit: Payer: Self-pay

## 2019-06-09 ENCOUNTER — Other Ambulatory Visit: Payer: Self-pay | Admitting: Oncology

## 2019-06-09 ENCOUNTER — Ambulatory Visit
Admission: RE | Admit: 2019-06-09 | Discharge: 2019-06-09 | Disposition: A | Discharge status: Home / Self Care | Payer: Medicare Other | Source: Ambulatory Visit | Attending: Interventional Radiology | Admitting: Interventional Radiology

## 2019-06-09 DIAGNOSIS — C689 Malignant neoplasm of urinary organ, unspecified: Secondary | ICD-10-CM

## 2019-06-09 DIAGNOSIS — C787 Secondary malignant neoplasm of liver and intrahepatic bile duct: Secondary | ICD-10-CM

## 2019-06-09 HISTORY — PX: IR RADIOLOGIST EVAL & MGMT: IMG5224

## 2019-06-09 NOTE — Progress Notes (Signed)
Patient ID: David Rich, male   DOB: [birth year], 73 y.o.   MRN: [id number]         Chief Complaint: Recurrent stage IVa urothelial carcinoma   Referring Physician(s): Finnegan  History of Present Illness: David Rich is a 73 y.o. male with past medical history significant for hypertension, hyperlipidemia and stage IVa urethral carcinoma who was initially seen in telemedicine consultation for evaluation of rapidly enlarging symptomatic hepatic metastasis on 06/01/2019 and is seen again via telemedicine from the acquisition of abdominal MRI on 06/03/2019.  He is accompanied today on the phone call with his daughter.  Patient states that for the past 4 to 5 days his energy level has improved and his abdominal pain has nearly resolved.  Patient is otherwise without complaint and remains interested in pursuing all available treatment options.    Past Medical History:  Diagnosis Date   Cancer [facility]) 2013   Right Renal    Hyperlipidemia    Hypertension    Renal disorder    Urothelial carcinoma of bladder (David Rich) 2020    Past Surgical History:  Procedure Laterality Date   CORONARY STENT INTERVENTION N/A 09/28/2018   Procedure: CORONARY STENT INTERVENTION;  Surgeon: [name], MD;  Location: [place] [facility];  Service: Cardiovascular;  Laterality: N/A;   CORONARY STENT PLACEMENT     IR FLUORO GUIDE CV LINE RIGHT  04/03/2018   IR RADIOLOGIST EVAL & MGMT  06/01/2019   kidney removed Left 2013   LEFT HEART CATH AND CORONARY ANGIOGRAPHY N/A 09/28/2018   Procedure: LEFT HEART CATH AND CORONARY ANGIOGRAPHY;  Surgeon: [name], MD;  Location: [place] [facility];  Service: Cardiovascular;  Laterality: N/A;    Allergies: Clopidogrel, Rosuvastatin calcium, Etodolac, Hydrocodone, Other, Statins, Meloxicam, and Pravastatin sodium  Medications: Prior to Admission medications   Medication Sig Start Date End Date Taking? Authorizing Provider  acetaminophen  (TYLENOL) 325 MG tablet Take 325 mg by mouth every 4 (four) hours as needed.  02/18/18   [provider]  ALPRAZolam Duanne Moron) 0.25 MG tablet  05/18/19   [provider]  amLODipine (NORVASC) 10 MG tablet TAKE 1 TABLET BY MOUTH EVERY DAY 02/01/19   [provider]  aspirin EC 81 MG tablet Take by mouth.    [provider]  carvedilol (COREG) 6.25 MG tablet Take 1 tablet (6.25 mg total) by mouth 2 (two) times daily. 03/12/19   [name], MD  clopidogrel (PLAVIX) 75 MG tablet Take 75 mg by mouth daily. 11/02/18 11/02/19  [provider]  ezetimibe (ZETIA) 10 MG tablet TAKE 1 TABLET BY MOUTH DAILY 03/24/19   [name], MD  famotidine (PEPCID) 20 MG tablet Take 20 mg by mouth 2 (two) times daily.  03/03/19 03/02/20  [provider]  fentaNYL (DURAGESIC) 25 MCG/HR Place 1 patch onto the skin every 3 (three) days. Patient not taking: Reported on 06/03/2019 04/22/19   [name], MD  Garlic 123XX123 MG TABS Take by mouth.     [provider]  HYDROcodone-acetaminophen (NORCO/VICODIN) 5-325 MG tablet Take 1 tablet by mouth every 6 (six) hours as needed for moderate pain. 04/29/19   [name], MD  magnesium hydroxide (MILK OF MAGNESIA) 400 MG/5ML suspension Take 30 mLs by mouth.    [provider]  megestrol (MEGACE) 20 MG tablet Take 1 tablet (20 mg total) by mouth daily. Patient not taking: Reported on 06/03/2019 05/06/19   [name], MD  metoCLOPramide (REGLAN) 10 MG tablet Take 1 tablet (10 mg total) by mouth 4 (four) times daily. Patient not taking: Reported on 06/03/2019 05/20/19   [name], MD  Multiple Vitamin (MULTI-VITAMINS) TABS Take by mouth.    [provider]  nitroGLYCERIN (NITROSTAT) 0.4 MG SL tablet Place under the tongue. 10/05/18 10/05/19  [provider]  Omega-3 Fatty Acids (FISH OIL OMEGA-3) 1000 MG CAPS Take by mouth daily.    [provider]    ondansetron (ZOFRAN) 8 MG tablet Take 1 tablet (8 mg total) by mouth every 8 (eight) hours as needed for nausea or vomiting. 04/06/19   [name], MD  polyethylene glycol (MIRALAX / GLYCOLAX) 17 g packet Take 17 g by mouth daily.    [provider]  prochlorperazine (COMPAZINE) 10 MG tablet Take 1 tablet (10 mg total) by mouth every 6 (six) hours as needed for nausea or vomiting. 04/29/19   [name], MD  sucralfate (CARAFATE) 1 g tablet Take 1 tablet (1 g total) by mouth 3 (three) times daily. Patient not taking: Reported on 06/03/2019 04/14/19   Noreene Filbert, MD     Family History  Problem Relation Age of Onset   Coronary artery disease Mother    Anemia Mother    Kidney failure Mother    Subarachnoid hemorrhage Father    Breast cancer Sister 55   Hypertension Brother     Social History   Socioeconomic History   Marital status: Divorced    Spouse name: Not on file   Number of children: Not on file   Years of education: Not on file   Highest education level: Not on file  Occupational History   Not on file  Social Needs   Financial resource strain: Not on file   Food insecurity    Worry: Not on file    Inability: Not on file   Transportation needs    Medical: Not on file    Non-medical: Not on file  Tobacco Use   Smoking status: Former Smoker    Packs/day: 1.00    Years: 55.00    Pack years: 55.00    Types: Cigarettes    Quit date: 03/20/2012    Years since quitting: 7.2   Smokeless tobacco: Never Used  Substance and Sexual Activity   Alcohol use: Yes    Comment: Social   Drug use: No   Sexual activity: Not on file  Lifestyle   Physical activity    Days per week: Not on file    Minutes per session: Not on file   Stress: Not on file  Relationships   Social connections    Talks on phone: Not on file    Gets together: Not on file    Attends religious service: Not on file    Active member of club or organization:  Not on file    Attends meetings of clubs or organizations: Not on file    Relationship status: Not on file  Other Topics Concern   Not on file  Social History Narrative   Not on file    ECOG Status: 1 - Symptomatic but completely ambulatory  Review of Systems  Review of Systems: A 12 point ROS discussed and pertinent positives are indicated in the HPI above.  All other systems are negative.  Physical Exam No direct physical exam was performed (except for noted visual exam findings with Video Visits).   Vital Signs: There were no vitals taken for  this visit.  Imaging: Mr Abdomen Wwo Contrast  Result Date: 06/04/2019 CLINICAL DATA:  Stage IV urethral carcinoma. Lesion identified on CT within liver. EXAM: MRI ABDOMEN WITHOUT AND WITH CONTRAST TECHNIQUE: Multiplanar multisequence MR imaging of the abdomen was performed both before and after the administration of intravenous contrast. CONTRAST:  8 mL Gadavist COMPARISON:  CT 05/24/2019, lumbar MRI 03/27/2019 FINDINGS: Lower chest: Lung bases clear Hepatobiliary: Multiple round lesion liver which are hyperintense to the normal liver parenchyma on T2 weighted imaging (series 3). Example lesion in the central LEFT hepatic lobe measures 4.2 cm and corresponds to the 3.9 cm lesion seen on comparison MRI. Multiple additional lesions are evident by MRI imaging. For example 3 adjacent lesions in the inferior RIGHT hepatic lobe measuring 1.8, 2.5 and 1.9 cm on image 19. Approximately 12 lesions in the liver. These lesions demonstrate avid postcontrast enhancement (series 11) Portal veins are patent. A central lesion deforms but does not appear to invade the middle hepatic vein (image 12/series 14). Pancreas: Normal pancreatic parenchymal intensity. No ductal dilatation or inflammation. Spleen: Normal spleen. Adrenals/urinary tract: Adrenal glands are normal. Post RIGHT nephrectomy. LEFT kidney normal with a simple cyst in the upper pole. Stomach/Bowel:  Stomach and limited of the small bowel is unremarkable Vascular/Lymphatic: Abdominal aorta normal caliber. IN the periaortic retroperitoneum just inferior to the LEFT renal vein there is a 2.8 cm nodal mass (image 13/15). There is abnormal enhancement at this level which extends into the adjacent vertebral body. Abnormal enhancement within the L3 and L2 vertebral bodies at this level which extends to the psoas muscles additionally on the RIGHT Musculoskeletal: Concern for metastatic disease involving L2 and L3 vertebral bodies as described the lymphatic section. IMPRESSION: 1. Multifocal hepatic metastasis involving the both hepatic lobes with approximately 12 round enhancing lesions. 2. Retroperitoneal metastatic adenopathy, with involvement of the psoas muscles as well as the L2 and L3 vertebral body levels. Findings described on MRI 03/27/2019 Electronically Signed   By: Suzy Bouchard M.D.   On: 06/04/2019 10:08   Ct Abdomen Pelvis W Contrast  Result Date: 05/24/2019 CLINICAL DATA:  Recurrent urothelial carcinoma with metastatic disease. Undergoing radiation therapy. Worsening bilateral lower abdominal pain 4 weeks. Previous right nephrectomy for renal cell carcinoma. EXAM: CT ABDOMEN AND PELVIS WITH CONTRAST TECHNIQUE: Multidetector CT imaging of the abdomen and pelvis was performed using the standard protocol following bolus administration of intravenous contrast. CONTRAST:  74mL OMNIPAQUE IOHEXOL 300 MG/ML  SOLN COMPARISON:  03/11/2019 FINDINGS: Lower Chest: Bibasilar pulmonary metastases are again seen, without significant interval change. Diffuse wall thickening of visualized portion of distal esophagus again seen, consistent with esophagitis. Hepatobiliary: Rim enhancing low-attenuation mass in the anterior hepatic dome has increased in size, currently measuring 4.0 x 3.7 cm compared to 2.3 x 2.2 cm previously. No other liver masses are identified. Tiny calcified gallstone is again seen, however  there is no evidence of cholecystitis or biliary ductal dilatation. Pancreas:  No mass or inflammatory changes. Spleen: Within normal limits in size and appearance. Adrenals/Urinary Tract: Previous right nephrectomy. Stable tiny cyst in upper pole of left kidney. No evidence of left renal mass or hydronephrosis. Urinary bladder is nearly empty and not well visualized. Stomach/Bowel: No evidence of obstruction, inflammatory process or abnormal fluid collections. Diffuse colonic diverticulosis is again seen, however there is no evidence of diverticulitis. Normal appendix visualized. Vascular/Lymphatic: Mild retroperitoneal lymphadenopathy in the left para-aortic and aortocaval spaces remains stable, with index lymph node in the left paraaortic region measuring  1.6 cm on image 34/2. Mild right retrocrural lymphadenopathy is stable. Mild bilateral iliac lymphadenopathy is also stable, with largest index lymph node in the right external iliac chain measuring 1.6 cm. No new or increased lymphadenopathy identified. Reproductive: Normal size prostate gland. No masses identified. Penile prosthesis again noted. Other:  None. Musculoskeletal: Lytic metastasis in L2 vertebral body is stable in size. Increased surrounding sclerosis is likely due to post treatment healing response. A new lytic lesion is seen in the anterior L3 vertebral body, also consistent with bone metastasis. IMPRESSION: 1. Increased size of mass in the anterior hepatic dome, consistent with hepatic metastasis. 2. Stable mild retroperitoneal and bilateral iliac lymphadenopathy, consistent with metastatic disease. 3. Stable bibasilar pulmonary metastases. 4. New lytic lesion in L3 vertebral body, consistent with bone metastasis. Stable size of lytic metastasis in L2 vertebral body. 5. Stable distal esophageal wall thickening, consistent with esophagitis. 6. Cholelithiasis. No radiographic evidence of cholecystitis. 7. Colonic diverticulosis. No radiographic  evidence of diverticulitis. Electronically Signed   By: Marlaine Hind M.D.   On: 05/24/2019 12:10   Ir Radiologist Eval & Mgmt  Result Date: 06/01/2019 Please refer to notes tab for details about interventional procedure. (Op Note)   Labs:  CBC: Recent Labs    05/06/19 0827 05/20/19 0820 05/27/19 0814 06/03/19 0848  WBC 3.1* 2.0* 3.6* 7.2  HGB 10.8* 9.2* 9.9* 11.1*  HCT 33.1* 27.9* 30.7* 34.2*  PLT 134* 81* 232 237    COAGS: Recent Labs    09/27/18 0047  INR 1.02  APTT >160*    BMP: Recent Labs    05/06/19 0827 05/20/19 0820 05/27/19 0814 06/03/19 0848  NA 134* 137 137 137  K 3.8 3.9 3.7 3.7  CL 104 108 108 107  CO2 22 21* 22 23  GLUCOSE 137* 124* 100* 135*  BUN 15 12 16 18   CALCIUM 8.9 8.2* 8.2* 8.6*  CREATININE 1.07 1.06 1.06 1.07  GFRNONAA >60 >60 >60 >60  GFRAA >60 >60 >60 >60    LIVER FUNCTION TESTS: Recent Labs    05/06/19 0827 05/20/19 0820 05/27/19 0814 06/03/19 0848  BILITOT 0.4 0.4 0.5 0.4  AST 19 19 17 18   ALT 21 21 15 19   ALKPHOS 82 82 83 90  PROT 7.1 6.4* 6.9 7.5  ALBUMIN 3.8 3.8 3.7 4.0    TUMOR MARKERS: No results for input(s): AFPTM, CEA, CA199, CHROMGRNA in the last 8760 hours.  Assessment and Plan:  JAVONNE BORKE is a 73 y.o. male with past medical history significant for hypertension, hyperlipidemia and stage IVa urethral carcinoma who was initially seen in telemedicine consultation for evaluation of rapidly enlarging symptomatic hepatic metastasis on 06/01/2019 and is seen again via telemedicine from the acquisition of abdominal MRI on 06/03/2019.   Patient states that for the past 4 to 5 days his energy level has improved and his abdominal pain has nearly resolved.  Unfortunately, the abdominal MRI performed 06/03/2019 demonstrates multifocal disease throughout the liver with at least 12 liver lesions, much more than were suspected on CT scan of the abdomen pelvis performed 05/24/2019.  Given the unexpected marked progression  of hepatic metastasis disease, I will discuss the appropriateness of obtaining a preprocedural PET/CT with Dr. Grayland Ormond as his last PET/CT was performed in September of last year.  Given advanced hepatic metastatic disease, the patient is NOT a candidate for microwave ablation however given persistent normalization of his LFTs, he remains a candidate for Y 90 radioembolization.  As such, conversations were  held with the patient regarding the benefits and risks (including but not limited to radiation and contrast exposure, bleeding, vessel injury and nontarget embolization) of Y 90 radioembolization.  I explained that this procedure would be performed for palliative purposes and is not curative.  I explained that while typically Y 90 radioembolization is not associated with pain, however if the dominant lesion within the dome of the liver was previously causing his abdominal pain due to capsular involvement and rapid growth, this symptomology could acutely worsen following the embolization though my hope is that the embolization would ultimately result in reduction of the size of the lesion and associated symptomatology as well as the fact that his abdominal pain has nearly resolved in the past 4 to 5 days.  Following this prolonged and detailed conversation, the patient wishes to pursue evaluation for Y 90 radioembolization.  Plan: 1.  Per my discussion with Dr. Grayland Ormond, the patient will be scheduled for preprocedural PET/CT, hopeful to be performed next week at [facility]. 2.  We will begin process to obtain insurance approval for Y 90 radioembolization.  All 3 procedures (mapping, right and left lobe treatments) will be performed at [facility] on an outpatient basis.  Note, the patient is on Plavix which will have to be held (with or without a bridge as per prescribing physician) for at least 5 days prior to each of the interventions.  The patient and the patient's daughter know to call the  interventional radiology clinic with any interval questions or concerns.  A copy of this report was sent to the requesting provider on this date.  Electronically Signed: Sandi Mariscal 06/09/2019, 9:12 AM   I spent a total of 15 Minutes in remote  clinical consultation, greater than 50% of which was counseling/coordinating care for Y 90 radioembolization.    Visit type: Audio only (telephone). Audio (no video) only due to patient's lack of internet/smartphone capability. Alternative for in-person consultation at [facility], [address] [place], [place], [place]. This visit type was conducted due to national recommendations for restrictions regarding the COVID-19 Pandemic (e.g. social distancing).  This format is felt to be most appropriate for this patient at this time.  All issues noted in this document were discussed and addressed.

## 2019-06-10 ENCOUNTER — Other Ambulatory Visit: Payer: Medicare Other

## 2019-06-10 ENCOUNTER — Other Ambulatory Visit: Payer: Self-pay | Admitting: Oncology

## 2019-06-10 DIAGNOSIS — C689 Malignant neoplasm of urinary organ, unspecified: Secondary | ICD-10-CM

## 2019-06-11 NOTE — Progress Notes (Signed)
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  INTERVAL HISTORY: Patient returns to clinic today for further evaluation and consideration of cycle 2 of palliative Keytruda.  Patient states his pain has improved and usually only lasts for several hours in the morning and then he is "fine" the rest of the day.  His appetite has improved and he does not complain of nausea today.  He has no neurologic complaints.  He denies any recent fevers or illnesses.  He denies any chest pain, shortness of breath, cough, or hemoptysis.  He denies any constipation or diarrhea.  He has no urinary complaints.  Patient offers no further specific complaints today.  REVIEW OF SYSTEMS:   Review of Systems  Constitutional: Positive for malaise/fatigue. Negative for fever and weight loss.  Respiratory: Negative.  Negative for cough and shortness of breath.   Cardiovascular: Negative.  Negative for chest pain and leg swelling.  Gastrointestinal: Negative.  Negative for abdominal pain, constipation, diarrhea, nausea and vomiting.  Genitourinary: Negative.  Negative for dysuria and hematuria.  Musculoskeletal: Positive for back pain.  Skin: Negative.  Negative for rash.  Neurological: Positive for weakness. Negative for sensory change, focal weakness and headaches.  Psychiatric/Behavioral: Negative.  The patient is not nervous/anxious and does not have insomnia.     As per HPI. Otherwise, a complete review of systems is negative.  PAST MEDICAL HISTORY: Past Medical History:  Diagnosis Date   Cancer [facility]) 2013   Right  Renal    Hyperlipidemia    Hypertension    Renal disorder    Urothelial carcinoma of bladder ([place]) 2020    PAST SURGICAL HISTORY: Past Surgical History:  Procedure Laterality Date   CORONARY STENT INTERVENTION N/A 09/28/2018   Procedure: CORONARY STENT INTERVENTION;  Surgeon: [name], MD;  Location: [place] [facility];  Service: Cardiovascular;  Laterality: N/A;   CORONARY STENT PLACEMENT     IR FLUORO GUIDE CV LINE RIGHT  04/03/2018   IR RADIOLOGIST EVAL & MGMT  06/01/2019   IR RADIOLOGIST EVAL & MGMT  06/09/2019   kidney removed Left 2013   LEFT HEART CATH AND CORONARY ANGIOGRAPHY N/A 09/28/2018   Procedure: LEFT HEART CATH AND CORONARY ANGIOGRAPHY;  Surgeon: [name], MD;  Location: [place] [facility];  Service: Cardiovascular;  Laterality: N/A;    FAMILY HISTORY: Family History  Problem Relation Age of Onset   Coronary artery disease Mother    Anemia Mother    Kidney failure Mother    Subarachnoid hemorrhage Father    Breast cancer Sister 72   Hypertension Brother     ADVANCED DIRECTIVES (Y/N):  N  HEALTH MAINTENANCE: Social History   Tobacco Use   Smoking status: Former Smoker    Packs/day: 1.00    Years: 55.00    Pack years: 55.00    Types: Cigarettes    Quit date: 03/20/2012    Years since quitting: 7.2   Smokeless tobacco: Never Used  Substance Use Topics   Alcohol use: Yes    Comment: Social   Drug use: No     Colonoscopy:  PAP:  Bone density:  Lipid panel:  Allergies  Allergen Reactions   Clopidogrel Rash and Hives   Rosuvastatin Calcium Anaphylaxis   Etodolac Nausea And Vomiting and Other (See Comments)    Bad dreams Bad dreams    Hydrocodone Other (See Comments)    Bad dreams Bad dreams    Other    Statins    Meloxicam Rash    Bad dreams Bad dreams    Pravastatin Sodium Rash    Current Outpatient Medications  Medication Sig Dispense Refill   acetaminophen (TYLENOL) 325 MG tablet  Take 325 mg by mouth every 4 (four) hours as needed.      ALPRAZolam (XANAX) 0.25 MG tablet      amLODipine (NORVASC) 10 MG tablet TAKE 1 TABLET BY MOUTH EVERY DAY     aspirin EC 81 MG tablet Take by mouth.     carvedilol (COREG) 6.25 MG tablet Take 1 tablet (6.25 mg total) by mouth 2 (two) times daily. 60 tablet 6   clopidogrel (PLAVIX) 75 MG tablet Take 75 mg by mouth daily.     ezetimibe (ZETIA) 10 MG tablet TAKE 1 TABLET BY MOUTH DAILY 90 tablet 0   famotidine (PEPCID) 20 MG tablet Take 20 mg by mouth 2 (two) times daily.      fentaNYL (DURAGESIC) 25 MCG/HR Place 1 patch onto the skin every 3 (three) days. 10 patch 0   Garlic 123XX123 MG TABS Take by mouth.      HYDROcodone-acetaminophen (NORCO/VICODIN) 5-325 MG tablet Take 1 tablet by mouth every 6 (six) hours as needed for moderate pain. 120 tablet 0   magnesium hydroxide (MILK OF MAGNESIA) 400 MG/5ML suspension Take 30 mLs by mouth.     megestrol (MEGACE) 20 MG tablet Take 1 tablet (20 mg total) by mouth daily. 30 tablet 1   metoCLOPramide (REGLAN) 10 MG tablet Take 1 tablet (10 mg total) by mouth 4 (four) times daily. 30 tablet 1   Multiple Vitamin (MULTI-VITAMINS) TABS Take by mouth.     nitroGLYCERIN (NITROSTAT) 0.4 MG SL tablet Place under the tongue.     Omega-3 Fatty Acids (FISH OIL OMEGA-3) 1000 MG CAPS Take by mouth daily.     ondansetron (ZOFRAN) 8 MG tablet Take 1 tablet (8 mg total) by mouth every 8 (eight) hours as needed for nausea or vomiting. 30 tablet 3   polyethylene glycol (MIRALAX / GLYCOLAX) 17 g packet Take 17 g by mouth daily.     prochlorperazine (COMPAZINE) 10 MG tablet Take 1 tablet (10 mg total) by mouth every 6 (six) hours as needed for nausea or vomiting. 30 tablet 0   sucralfate (CARAFATE) 1 g tablet Take 1 tablet (1 g total) by mouth 3 (three) times daily. 90 tablet 3   No current facility-administered medications for this visit.    Facility-Administered Medications Ordered in Other Visits    Medication Dose Route Frequency Provider Last Rate Last Dose   [COMPLETED] heparin lock flush 100 unit/mL  500 Units Intracatheter Once PRN [name], MD   500 Units at 06/22/19 1225   pembrolizumab (KEYTRUDA) 200 mg in sodium chloride 0.9 % 50 mL chemo infusion  200 mg Intravenous Once [name], MD 116 mL/hr at 06/22/19 1146 200 mg at 06/22/19 1146   sodium chloride flush (NS) 0.9 % injection 10 mL  10 mL Intravenous PRN [name], MD   10 mL at 10/05/18 1026    OBJECTIVE: Vitals:   06/22/19 1005  BP: (!) 157/84  Pulse: 63  Resp:  18  Temp: (!) 96.7 F (35.9 C)     Body mass index is 25.04 kg/m.    ECOG FS:0 - Asymptomatic   General: Well-developed, well-nourished, no acute distress. Eyes: Pink conjunctiva, anicteric sclera. HEENT: Normocephalic, moist mucous membranes. Lungs: Clear to auscultation bilaterally. Heart: Regular rate and rhythm. No rubs, murmurs, or gallops. Abdomen: Soft, nontender, nondistended. No organomegaly noted, normoactive bowel sounds. Musculoskeletal: No edema, cyanosis, or clubbing. Neuro: Alert, answering all questions appropriately. Cranial nerves grossly intact. Skin: No rashes or petechiae noted. Psych: Normal affect.  LAB RESULTS:  Lab Results  Component Value Date   NA 137 06/22/2019   K 3.9 06/22/2019   CL 106 06/22/2019   CO2 22 06/22/2019   GLUCOSE 118 (H) 06/22/2019   BUN 14 06/22/2019   CREATININE 0.94 06/22/2019   CALCIUM 8.4 (L) 06/22/2019   PROT 7.2 06/22/2019   ALBUMIN 3.7 06/22/2019   AST 21 06/22/2019   ALT 19 06/22/2019   ALKPHOS 101 06/22/2019   BILITOT 0.5 06/22/2019   GFRNONAA >60 06/22/2019   GFRAA >60 06/22/2019    Lab Results  Component Value Date   WBC 8.8 06/22/2019   NEUTROABS 6.6 06/22/2019   HGB 10.9 (L) 06/22/2019   HCT 34.9 (L) 06/22/2019   MCV 100.6 (H) 06/22/2019   PLT 227 06/22/2019     STUDIES: Mr Abdomen Wwo Contrast  Result Date: 06/04/2019 CLINICAL DATA:   Stage IV urethral carcinoma. Lesion identified on CT within liver. EXAM: MRI ABDOMEN WITHOUT AND WITH CONTRAST TECHNIQUE: Multiplanar multisequence MR imaging of the abdomen was performed both before and after the administration of intravenous contrast. CONTRAST:  8 mL Gadavist COMPARISON:  CT 05/24/2019, lumbar MRI 03/27/2019 FINDINGS: Lower chest: Lung bases clear Hepatobiliary: Multiple round lesion liver which are hyperintense to the normal liver parenchyma on T2 weighted imaging (series 3). Example lesion in the central LEFT hepatic lobe measures 4.2 cm and corresponds to the 3.9 cm lesion seen on comparison MRI. Multiple additional lesions are evident by MRI imaging. For example 3 adjacent lesions in the inferior RIGHT hepatic lobe measuring 1.8, 2.5 and 1.9 cm on image 19. Approximately 12 lesions in the liver. These lesions demonstrate avid postcontrast enhancement (series 11) Portal veins are patent. A central lesion deforms but does not appear to invade the middle hepatic vein (image 12/series 14). Pancreas: Normal pancreatic parenchymal intensity. No ductal dilatation or inflammation. Spleen: Normal spleen. Adrenals/urinary tract: Adrenal glands are normal. Post RIGHT nephrectomy. LEFT kidney normal with a simple cyst in the upper pole. Stomach/Bowel: Stomach and limited of the small bowel is unremarkable Vascular/Lymphatic: Abdominal aorta normal caliber. IN the periaortic retroperitoneum just inferior to the LEFT renal vein there is a 2.8 cm nodal mass (image 13/15). There is abnormal enhancement at this level which extends into the adjacent vertebral body. Abnormal enhancement within the L3 and L2 vertebral bodies at this level which extends to the psoas muscles additionally on the RIGHT Musculoskeletal: Concern for metastatic disease involving L2 and L3 vertebral bodies as described the lymphatic section. IMPRESSION: 1. Multifocal hepatic metastasis involving the both hepatic lobes with approximately  12 round enhancing lesions. 2. Retroperitoneal metastatic adenopathy, with involvement of the psoas muscles as well as the L2 and L3 vertebral body levels. Findings described on MRI 03/27/2019 Electronically Signed   By: Suzy Bouchard M.D.   On: 06/04/2019 10:08   Ct Abdomen Pelvis W Contrast  Result Date: 05/24/2019 CLINICAL DATA:  Recurrent urothelial carcinoma with metastatic disease. Undergoing radiation  therapy. Worsening bilateral lower abdominal pain 4 weeks. Previous right nephrectomy for renal cell carcinoma. EXAM: CT ABDOMEN AND PELVIS WITH CONTRAST TECHNIQUE: Multidetector CT imaging of the abdomen and pelvis was performed using the standard protocol following bolus administration of intravenous contrast. CONTRAST:  52mL OMNIPAQUE IOHEXOL 300 MG/ML  SOLN COMPARISON:  03/11/2019 FINDINGS: Lower Chest: Bibasilar pulmonary metastases are again seen, without significant interval change. Diffuse wall thickening of visualized portion of distal esophagus again seen, consistent with esophagitis. Hepatobiliary: Rim enhancing low-attenuation mass in the anterior hepatic dome has increased in size, currently measuring 4.0 x 3.7 cm compared to 2.3 x 2.2 cm previously. No other liver masses are identified. Tiny calcified gallstone is again seen, however there is no evidence of cholecystitis or biliary ductal dilatation. Pancreas:  No mass or inflammatory changes. Spleen: Within normal limits in size and appearance. Adrenals/Urinary Tract: Previous right nephrectomy. Stable tiny cyst in upper pole of left kidney. No evidence of left renal mass or hydronephrosis. Urinary bladder is nearly empty and not well visualized. Stomach/Bowel: No evidence of obstruction, inflammatory process or abnormal fluid collections. Diffuse colonic diverticulosis is again seen, however there is no evidence of diverticulitis. Normal appendix visualized. Vascular/Lymphatic: Mild retroperitoneal lymphadenopathy in the left para-aortic  and aortocaval spaces remains stable, with index lymph node in the left paraaortic region measuring 1.6 cm on image 34/2. Mild right retrocrural lymphadenopathy is stable. Mild bilateral iliac lymphadenopathy is also stable, with largest index lymph node in the right external iliac chain measuring 1.6 cm. No new or increased lymphadenopathy identified. Reproductive: Normal size prostate gland. No masses identified. Penile prosthesis again noted. Other:  None. Musculoskeletal: Lytic metastasis in L2 vertebral body is stable in size. Increased surrounding sclerosis is likely due to post treatment healing response. A new lytic lesion is seen in the anterior L3 vertebral body, also consistent with bone metastasis. IMPRESSION: 1. Increased size of mass in the anterior hepatic dome, consistent with hepatic metastasis. 2. Stable mild retroperitoneal and bilateral iliac lymphadenopathy, consistent with metastatic disease. 3. Stable bibasilar pulmonary metastases. 4. New lytic lesion in L3 vertebral body, consistent with bone metastasis. Stable size of lytic metastasis in L2 vertebral body. 5. Stable distal esophageal wall thickening, consistent with esophagitis. 6. Cholelithiasis. No radiographic evidence of cholecystitis. 7. Colonic diverticulosis. No radiographic evidence of diverticulitis. Electronically Signed   By: Marlaine Hind M.D.   On: 05/24/2019 12:10   Nm Pet Image Restag (ps) Skull Base To Thigh  Result Date: 06/17/2019 CLINICAL DATA:  Subsequent treatment strategy for uroepithelial carcinoma of the bladder. EXAM: NUCLEAR MEDICINE PET SKULL BASE TO THIGH TECHNIQUE: 10.5 mCi F-18 FDG was injected intravenously. Full-ring PET imaging was performed from the skull base to thigh after the radiotracer. CT data was obtained and used for attenuation correction and anatomic localization. Fasting blood glucose: 90 mg/dl COMPARISON:  Abdominal MRI 06/04/2019. Abdomen/pelvis CT 05/24/2019. PET-CT 06/29/2018. FINDINGS:  Mediastinal blood pool activity: SUV max  2.1 Liver activity: SUV max NA NECK: No hypermetabolic lymph nodes in the neck. Incidental CT findings: none CHEST: Hypermetabolic lymphadenopathy is seen in the left supraclavicular region. Index 9 mm short axis node on image 63/series 3 demonstrates SUV max = 9.0 hypermetabolic left supraclavicular node on 73/3 demonstrates SUV max = 12.1. No evidence for hypermetabolic mediastinal or hilar lymphadenopathy. No hypermetabolic axillary adenopathy. Hypermetabolic pulmonary nodules are identified in the lungs bilaterally. Index 8 mm anterior right upper lobe nodule (118/3) demonstrates SUV max = 5.9. 14 mm irregular nodule posterior right  lower lobe on 01/30 6/3 has SUV max = 6.0. Incidental CT findings: Coronary artery calcification is evident. Atherosclerotic calcification is noted in the wall of the thoracic aorta. Right Port-A-Cath tip is positioned at the SVC/RA junction. Centrilobular and paraseptal emphysema evident. ABDOMEN/PELVIS: Bulky hypermetabolic liver metastases are evident. A dominant lesion in the dome of liver seen on recent MRI at 4.2 cm demonstrates SUV max = 10.2. Posterior right hepatic lesion measuring 2.5 cm on image 155/3 has SUV max = 10.2. 2.7 cm lesion inferior right liver on 192/3 demonstrates SUV max = 9.8. Hypermetabolic lymphadenopathy noted in the right retrocrural space. Retroperitoneal lymphadenopathy is hypermetabolic. Index 17 mm left para-aortic node on 191/3 demonstrates SUV max = 17.4. Hypermetabolic retroperitoneal lymphadenopathy extends into the pelvis with hypermetabolic lymph nodes identified in the right pelvic sidewall. Index 15 mm short axis right pelvic sidewall node on 248/3 has SUV max = 17.8. Incidental CT findings: Tiny calcified gallstone evident. Right kidney surgically absent. Diffuse colonic diverticulosis without diverticulitis. Penile prosthetic device evident. There is abdominal aortic atherosclerosis without  aneurysm. SKELETON: Hypermetabolic lesion noted inferior right scapula with SUV max = 3.8. Mixed lytic and sclerotic lesion in the L2 vertebral body is hypermetabolic with SUV max = 6.6 metastatic lesion noted L3 level. Incidental CT findings: Mild degenerative changes noted in the hips. IMPRESSION: 1. Hypermetabolic nodal metastases identified in the left supraclavicular space, right retrocrural space, abdominal retroperitoneal space, and extraperitoneal nodal chains of the posterior pelvis and right pelvic sidewall. 2. Hypermetabolic pulmonary nodules consistent with metastatic disease. 3. Bulky hypermetabolic liver metastases. 4. Hypermetabolic bone lesions including the inferior right scapula and L2 vertebral body consistent with metastatic involvement. 5. Cholelithiasis. 6.  Aortic Atherosclerois (ICD10-170.0) Electronically Signed   By: Misty Stanley M.D.   On: 06/17/2019 15:49   Ir Radiologist Eval & Mgmt  Result Date: 06/09/2019 Please refer to notes tab for details about interventional procedure. (Op Note)  Ir Radiologist Eval & Mgmt  Result Date: 06/01/2019 Please refer to notes tab for details about interventional procedure. 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  PDL 1 0%  PLAN:    1.  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  PET scan results from June 17, 2019 reviewed independently revealing widespread, progressive metastatic disease  Patient has a new lytic lesion in his L3 vertebral body.  Patient initiated palliative Keytruda 3 weeks ago.  He expressed understanding that his treatment options are limited at this point.  Appreciate interventional radiology input.  Plan is to consider Y 90 ablation of symptomatic hepatic metastasis.  Proceed with cycle 2 of Keytruda  today.  Return to clinic in 3 weeks for further evaluation and consideration of cycle 3.   2.  Genetic testing: Negative. 3.  Kidney function: Patient's creatinine continues to be within normal limits. 4.  Cardiac disease: Continue follow-up with cardiology as indicated. 5.  Pain: MRI results from March 27, 2019 reviewed independently consistent with metastatic disease.  Patient has now completed XRT, but still has residual pain.  He has discontinued his fentanyl patch secondary to nausea, but will consider retrying fentanyl patch at a lower dose of 12 mcg every 72 hours if his pain persists.  Continue oxycodone as prescribed. 6.  Constipation: Continue current bowel regimen and have recommended adding MiraLAX or magnesium citrate OTC. 7.  Bony metastasis: Patient  last received Zometa on May 06, 2019. 8.  Abdominal pain: Likely secondary to progressive disease.  Continue current narcotic regimen and referral to interventional radiology as above. 9.  Leukopenia: Resolved. 10.  Anemia: Chronic and unchanged with hemoglobin 10.9. 11.  Nausea: Patient does not complain of this today.  Patient has been instructed to take his medication scheduled rather than as needed.  Patient expressed understanding and was in agreement with this plan. He also understands that He can call clinic at any time with any questions, concerns, or complaints.   Cancer Staging Urothelial carcinoma of bladder [facility]) Staging form: Urinary Bladder, AJCC 8th Edition - Clinical: Stage IVA David Rich, cN2, cM1a) - Signed by [name], MD on 04/09/2018   [name], MD   06/22/2019 12:01 PM

## 2019-06-15 ENCOUNTER — Telehealth: Payer: Self-pay | Admitting: Cardiovascular Disease

## 2019-06-15 NOTE — Telephone Encounter (Signed)
Dr. Fletcher Anon, This patient has a history of HTN, HLD, stage IV urothelial cancer s/p chemo, CAD, and PAD.  You recently saw this pt in follow up after successful DES to LAD 09/28/2018. Since he is less than one year out from his stent, can you please comment on holding plavix for 5 days as requested from [facility] for an embolization for hepatic mets? He does have residual disease in the RCA (70%) and PAD.

## 2019-06-15 NOTE — Telephone Encounter (Signed)
° °  [facility] Pre-operative Risk Assessment    Request for surgical clearance:  1. What type of surgery is being performed? Y90 radio embolization procedure / hepatic metastasis   2. When is this surgery scheduled? TBD  3. What type of clearance is required (medical clearance vs. Pharmacy clearance to hold med vs. Both)?  Pharmacy   4. Are there any medications that need to be held prior to surgery and how long? 5 days   5. Practice name and name of physician performing surgery?  [facility] Dr. Pascal Lux   6. What is your office phone number [phone]   7.   What is your office fax number [phone]  8.   Anesthesia type (None, local, MAC, general) ? Not noted    Clarisse Gouge 06/15/2019, 10:08 AM  _________________________________________________________________   (provider comments below)

## 2019-06-17 ENCOUNTER — Encounter
Admission: RE | Admit: 2019-06-17 | Discharge: 2019-06-17 | Disposition: A | Discharge status: Home / Self Care | Payer: Medicare Other | Source: Ambulatory Visit | Attending: Oncology | Admitting: Oncology

## 2019-06-17 ENCOUNTER — Other Ambulatory Visit: Payer: Self-pay

## 2019-06-17 DIAGNOSIS — C689 Malignant neoplasm of urinary organ, unspecified: Secondary | ICD-10-CM | POA: Insufficient documentation

## 2019-06-17 DIAGNOSIS — K8020 Calculus of gallbladder without cholecystitis without obstruction: Secondary | ICD-10-CM | POA: Insufficient documentation

## 2019-06-17 DIAGNOSIS — R918 Other nonspecific abnormal finding of lung field: Secondary | ICD-10-CM | POA: Insufficient documentation

## 2019-06-17 DIAGNOSIS — I700 Atherosclerosis of aorta: Secondary | ICD-10-CM | POA: Insufficient documentation

## 2019-06-17 DIAGNOSIS — C778 Secondary and unspecified malignant neoplasm of lymph nodes of multiple regions: Secondary | ICD-10-CM | POA: Diagnosis not present

## 2019-06-17 DIAGNOSIS — C787 Secondary malignant neoplasm of liver and intrahepatic bile duct: Secondary | ICD-10-CM | POA: Insufficient documentation

## 2019-06-17 DIAGNOSIS — M899 Disorder of bone, unspecified: Secondary | ICD-10-CM | POA: Insufficient documentation

## 2019-06-17 LAB — GLUCOSE, CAPILLARY: Glucose-Capillary: 90 mg/dL (ref 70–99)

## 2019-06-17 MED ORDER — FLUDEOXYGLUCOSE F - 18 (FDG) INJECTION
10.4900 | Freq: Once | INTRAVENOUS | Status: AC | PRN
  Administered 2019-06-17: 10.49 via INTRAVENOUS

## 2019-06-18 NOTE — Telephone Encounter (Signed)
   Primary Cardiologist: Kathlyn Sacramento, MD  Chart reviewed as part of pre-operative protocol coverage. Per Dr. Fletcher Anon, patient can hold plavix 5 days prior to his upcoming radio embolization procedure. Plavix should be restarted as soon as he is cleared to do so by his surgeon. He should remain on aspirin without interruption.   I will route this recommendation to the requesting party via Epic fax function and remove from pre-op pool.  Please call with questions.  Abigail Butts, PA-C 06/18/2019, 1:35 PM

## 2019-06-18 NOTE — Telephone Encounter (Signed)
Hold Plavix 5 days before procedure and resume after.  Continue aspirin without interruption.

## 2019-06-21 ENCOUNTER — Telehealth: Payer: Self-pay

## 2019-06-21 NOTE — Telephone Encounter (Signed)
Nutrition Assessment   Reason for Assessment:  Patient identified on Malnutrition Screening tool for weight loss and poor po intake   ASSESSMENT:  73 year old male with stage IV urothelial carcinoma with left supraclavicular lymph node metastasis.  Patient receiving [place].  Palliative care if following.  Spoke with patient via phone this am to introduce self and service at cancer center.  Patient reports that he just does not have much of an appetite and has abdominal pain.  Does not think abdominal pain is effecting appetite as happens around 5-6 am and when he gets up and moves around goes away.  Reports that he usually eats sausage, egg and cheese biscuit for breakfast.  Nothing much for lunch.  Dinner is sometimes meat and vegetables, whatever daughter prepares for him.  Does not cook, lives alone.  Does not like ensure/boost shakes.      Nutrition Focused Physical Exam: deferred   Medications: pepcid,megace (not taking), reglan, MVI, omega 3, zofran, miralax, compazine, carafate   Labs: reviewed   Anthropometrics:   Height: 72 inches Weight: 184 lb (8/27) Weight on 12/2018 199 lb 7% weight loss in the last 6 months BMI: 24   Estimated Energy Needs  Kcals: [id number] Protein: 125-145 g Fluid: 2.5 L   NUTRITION DIAGNOSIS: Inadequate oral intake related to poor appetite as evidenced by 7% weight loss over the last 6 months   INTERVENTION:  Discussed strategies to increase calories and protein.  Discussed alternative oral nutrition supplements to ensure/boost.  Will leave samples for patient to pick up tomorrow (9/15) at appointment.  RD not in clinic on this day.   Contact information provided to patient. Patient will contact RD if needed for further nutrition issues/problems.    MONITORING, EVALUATION, GOAL: Patient will consume adequate calories and protein to maintain weight   Next Visit: patient to contact RD as needed  Spero Gunnels B. Zenia Resides, [place], [place] Registered  Dietitian [phone] (pager)

## 2019-06-22 ENCOUNTER — Inpatient Hospital Stay: Payer: Medicare Other | Attending: Oncology | Admitting: *Deleted

## 2019-06-22 ENCOUNTER — Inpatient Hospital Stay (HOSPITAL_BASED_OUTPATIENT_CLINIC_OR_DEPARTMENT_OTHER): Payer: Medicare Other | Admitting: Oncology

## 2019-06-22 ENCOUNTER — Other Ambulatory Visit: Payer: Self-pay

## 2019-06-22 ENCOUNTER — Inpatient Hospital Stay: Payer: Medicare Other

## 2019-06-22 ENCOUNTER — Encounter: Payer: Self-pay | Admitting: Oncology

## 2019-06-22 ENCOUNTER — Inpatient Hospital Stay (HOSPITAL_BASED_OUTPATIENT_CLINIC_OR_DEPARTMENT_OTHER): Payer: Medicare Other | Admitting: Hospice and Palliative Medicine

## 2019-06-22 VITALS — BP 157/84 | HR 63 | Temp 96.7°F | Resp 18 | Wt 184.6 lb

## 2019-06-22 DIAGNOSIS — Z5112 Encounter for antineoplastic immunotherapy: Secondary | ICD-10-CM | POA: Insufficient documentation

## 2019-06-22 DIAGNOSIS — C679 Malignant neoplasm of bladder, unspecified: Secondary | ICD-10-CM

## 2019-06-22 DIAGNOSIS — Z7189 Other specified counseling: Secondary | ICD-10-CM | POA: Diagnosis not present

## 2019-06-22 DIAGNOSIS — Z515 Encounter for palliative care: Secondary | ICD-10-CM

## 2019-06-22 DIAGNOSIS — Z95828 Presence of other vascular implants and grafts: Secondary | ICD-10-CM

## 2019-06-22 DIAGNOSIS — C770 Secondary and unspecified malignant neoplasm of lymph nodes of head, face and neck: Secondary | ICD-10-CM | POA: Insufficient documentation

## 2019-06-22 DIAGNOSIS — Z79899 Other long term (current) drug therapy: Secondary | ICD-10-CM | POA: Diagnosis not present

## 2019-06-22 DIAGNOSIS — I2510 Atherosclerotic heart disease of native coronary artery without angina pectoris: Secondary | ICD-10-CM | POA: Diagnosis not present

## 2019-06-22 LAB — CBC WITH DIFFERENTIAL/PLATELET
Abs Immature Granulocytes: 0.03 10*3/uL (ref 0.00–0.07)
Basophils Absolute: 0.1 10*3/uL (ref 0.0–0.1)
Basophils Relative: 1 %
Eosinophils Absolute: 0.2 10*3/uL (ref 0.0–0.5)
Eosinophils Relative: 2 %
HCT: 34.9 % — ABNORMAL LOW (ref 39.0–52.0)
Hemoglobin: 10.9 g/dL — ABNORMAL LOW (ref 13.0–17.0)
Immature Granulocytes: 0 %
Lymphocytes Relative: 12 %
Lymphs Abs: 1.1 10*3/uL (ref 0.7–4.0)
MCH: 31.4 pg (ref 26.0–34.0)
MCHC: 31.2 g/dL (ref 30.0–36.0)
MCV: 100.6 fL — ABNORMAL HIGH (ref 80.0–100.0)
Monocytes Absolute: 0.9 10*3/uL (ref 0.1–1.0)
Monocytes Relative: 11 %
Neutro Abs: 6.6 10*3/uL (ref 1.7–7.7)
Neutrophils Relative %: 74 %
Platelets: 227 10*3/uL (ref 150–400)
RBC: 3.47 MIL/uL — ABNORMAL LOW (ref 4.22–5.81)
RDW: 17.7 % — ABNORMAL HIGH (ref 11.5–15.5)
WBC: 8.8 10*3/uL (ref 4.0–10.5)
nRBC: 0 % (ref 0.0–0.2)

## 2019-06-22 LAB — COMPREHENSIVE METABOLIC PANEL
ALT: 19 U/L (ref 0–44)
AST: 21 U/L (ref 15–41)
Albumin: 3.7 g/dL (ref 3.5–5.0)
Alkaline Phosphatase: 101 U/L (ref 38–126)
Anion gap: 9 (ref 5–15)
BUN: 14 mg/dL (ref 8–23)
CO2: 22 mmol/L (ref 22–32)
Calcium: 8.4 mg/dL — ABNORMAL LOW (ref 8.9–10.3)
Chloride: 106 mmol/L (ref 98–111)
Creatinine, Ser: 0.94 mg/dL (ref 0.61–1.24)
GFR calc Af Amer: >60 mL/min (ref >60)
GFR calc non Af Amer: >60 mL/min (ref >60)
Glucose, Bld: 118 mg/dL — ABNORMAL HIGH (ref 70–99)
Potassium: 3.9 mmol/L (ref 3.5–5.1)
Sodium: 137 mmol/L (ref 135–145)
Total Bilirubin: 0.5 mg/dL (ref 0.3–1.2)
Total Protein: 7.2 g/dL (ref 6.5–8.1)

## 2019-06-22 MED ORDER — SODIUM CHLORIDE 0.9 % IV SOLN
Freq: Once | INTRAVENOUS | Status: AC
  Administered 2019-06-22: 11:00:00 via INTRAVENOUS
  Filled 2019-06-22: qty 250

## 2019-06-22 MED ORDER — SODIUM CHLORIDE 0.9 % IV SOLN
200.0000 mg | Freq: Once | INTRAVENOUS | Status: AC
  Administered 2019-06-22: 200 mg via INTRAVENOUS
  Filled 2019-06-22: qty 8

## 2019-06-22 MED ORDER — SODIUM CHLORIDE 0.9% FLUSH
10.0000 mL | Freq: Once | INTRAVENOUS | Status: AC
  Administered 2019-06-22: 10:00:00 10 mL via INTRAVENOUS
  Filled 2019-06-22: qty 10

## 2019-06-22 MED ORDER — HEPARIN SOD (PORK) LOCK FLUSH 100 UNIT/ML IV SOLN
500.0000 [IU] | Freq: Once | INTRAVENOUS | Status: AC | PRN
  Administered 2019-06-22: 500 [IU]
  Filled 2019-06-22: qty 5

## 2019-06-22 NOTE — Progress Notes (Signed)
Pt in for follow up, states had a PET scan curious of results.

## 2019-06-22 NOTE — Progress Notes (Signed)
[name]  [phone] [phone]   Name: David Rich Date: 06/22/2019 MRN: [id number]  DOB: 1946-09-20  Patient Care Team: Baxter Hire, MD as PCP - General (Internal Medicine) [name], MD as PCP - Cardiology (Cardiology) [name], MD as Consulting Physician (Oncology)    REASON FOR CONSULTATION: Palliative Care consult requested for this 73 y.o. 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  Patient has had difficulty with nausea and pain.  He was referred to palliative care to help address goals and manage ongoing symptoms.   SOCIAL HISTORY:     reports that he quit smoking about 7 years ago. His smoking use included cigarettes. He has a 55.00 pack-year smoking history. He has never used smokeless tobacco. He reports current alcohol use. He reports that he does not use drugs.  Patient is divorced.  He lives at home alone.  He has a son and daughter, both of whom live in [place].  Patient formally worked in a Civil engineer, contracting.  ADVANCE DIRECTIVES:  Does not have  CODE STATUS:   PAST MEDICAL HISTORY: Past Medical History:  Diagnosis Date   Cancer ([place]) 2013   Right Renal    Hyperlipidemia    Hypertension    Renal disorder    Urothelial carcinoma of bladder ([place]) 2020    PAST SURGICAL HISTORY:  Past Surgical History:  Procedure Laterality Date   CORONARY STENT INTERVENTION N/A 09/28/2018   Procedure: CORONARY STENT INTERVENTION;  Surgeon: [name], MD;  Location: [facility];  Service: Cardiovascular;  Laterality: N/A;   CORONARY STENT PLACEMENT     IR FLUORO GUIDE CV LINE RIGHT  04/03/2018   IR RADIOLOGIST EVAL & MGMT  06/01/2019   IR RADIOLOGIST EVAL & MGMT  06/09/2019   kidney removed Left 2013   LEFT HEART  CATH AND CORONARY ANGIOGRAPHY N/A 09/28/2018   Procedure: LEFT HEART CATH AND CORONARY ANGIOGRAPHY;  Surgeon: [name], MD;  Location: [facility];  Service: Cardiovascular;  Laterality: N/A;    HEMATOLOGY/ONCOLOGY HISTORY:  Oncology History  Urothelial carcinoma of bladder ([place])  03/23/2018 Initial Diagnosis   Urothelial carcinoma of bladder ([place])   03/26/2018 Cancer Staging   Staging form: Urinary Bladder, AJCC 8th Edition - Clinical: Stage IVA (cT4a, cN2, cM1a) - Signed by [name], MD on 04/09/2018   04/08/2018 - 05/19/2019 Chemotherapy   The patient had palonosetron (ALOXI) injection 0.25 mg, 0.25 mg, Intravenous,  Once, 5 of 7 cycles Administration: 0.25 mg (04/08/2018), 0.25 mg (04/15/2018), 0.25 mg (04/29/2018), 0.25 mg (05/06/2018), 0.25 mg (05/20/2018), 0.25 mg (05/27/2018), 0.25 mg (03/25/2019), 0.25 mg (04/08/2019), 0.25 mg (04/29/2019), 0.25 mg (05/06/2019) CISplatin (PLATINOL) 74 mg in sodium chloride 0.9 % 250 mL chemo infusion, 35 mg/m2 = 74 mg (100 % of original dose 35 mg/m2), Intravenous,  Once, 5 of 7 cycles Dose modification: 35 mg/m2 (original dose 35 mg/m2, Cycle 1, Reason: Other (see comments)) Administration: 74 mg (04/08/2018), 74 mg (04/15/2018), 74 mg (04/29/2018), 74 mg (05/06/2018), 74 mg (05/20/2018), 74 mg (05/27/2018), 74 mg (03/25/2019), 74 mg (04/08/2019), 74 mg (04/29/2019), 74 mg (05/06/2019) gemcitabine (GEMZAR) 2,200 mg in sodium chloride 0.9 % 250 mL chemo infusion, 2,128 mg, Intravenous,  Once, 5 of 7 cycles Dose modification: 800 mg/m2 (original dose 1,000 mg/m2, Cycle 3, Reason: Dose not tolerated) Administration: 2,200  mg (04/08/2018), 2,200 mg (04/15/2018), 2,200 mg (04/29/2018), 2,000 mg (05/06/2018), 1,600 mg (05/20/2018), 1,600 mg (05/27/2018), 1,600 mg (03/25/2019), 1,600 mg (04/08/2019), 1,600 mg (04/29/2019), 1,600 mg (05/06/2019) fosaprepitant (EMEND) 150 mg, dexamethasone (DECADRON) 12 mg in sodium chloride 0.9 % 145 mL IVPB, , Intravenous,  Once, 5 of 7  cycles Administration:  (04/08/2018),  (04/15/2018),  (04/29/2018),  (05/06/2018),  (05/20/2018),  (05/27/2018),  (03/25/2019),  (04/08/2019),  (04/29/2019),  (05/06/2019)  for chemotherapy treatment.    06/03/2019 -  Chemotherapy   The patient had pembrolizumab (KEYTRUDA) 200 mg in sodium chloride 0.9 % 50 mL chemo infusion, 200 mg, Intravenous, Once, 2 of 6 cycles Administration: 200 mg (06/03/2019)  for chemotherapy treatment.      ALLERGIES:  is allergic to clopidogrel; rosuvastatin calcium; etodolac; hydrocodone; other; statins; meloxicam; and pravastatin sodium.  MEDICATIONS:  Current Outpatient Medications  Medication Sig Dispense Refill   acetaminophen (TYLENOL) 325 MG tablet Take 325 mg by mouth every 4 (four) hours as needed.      ALPRAZolam (XANAX) 0.25 MG tablet      amLODipine (NORVASC) 10 MG tablet TAKE 1 TABLET BY MOUTH EVERY DAY     aspirin EC 81 MG tablet Take by mouth.     carvedilol (COREG) 6.25 MG tablet Take 1 tablet (6.25 mg total) by mouth 2 (two) times daily. 60 tablet 6   clopidogrel (PLAVIX) 75 MG tablet Take 75 mg by mouth daily.     ezetimibe (ZETIA) 10 MG tablet TAKE 1 TABLET BY MOUTH DAILY 90 tablet 0   famotidine (PEPCID) 20 MG tablet Take 20 mg by mouth 2 (two) times daily.      fentaNYL (DURAGESIC) 25 MCG/HR Place 1 patch onto the skin every 3 (three) days. 10 patch 0   Garlic 123XX123 MG TABS Take by mouth.      HYDROcodone-acetaminophen (NORCO/VICODIN) 5-325 MG tablet Take 1 tablet by mouth every 6 (six) hours as needed for moderate pain. 120 tablet 0   magnesium hydroxide (MILK OF MAGNESIA) 400 MG/5ML suspension Take 30 mLs by mouth.     megestrol (MEGACE) 20 MG tablet Take 1 tablet (20 mg total) by mouth daily. 30 tablet 1   metoCLOPramide (REGLAN) 10 MG tablet Take 1 tablet (10 mg total) by mouth 4 (four) times daily. 30 tablet 1   Multiple Vitamin (MULTI-VITAMINS) TABS Take by mouth.     nitroGLYCERIN (NITROSTAT) 0.4 MG SL tablet Place under the  tongue.     Omega-3 Fatty Acids (FISH OIL OMEGA-3) 1000 MG CAPS Take by mouth daily.     ondansetron (ZOFRAN) 8 MG tablet Take 1 tablet (8 mg total) by mouth every 8 (eight) hours as needed for nausea or vomiting. 30 tablet 3   polyethylene glycol (MIRALAX / GLYCOLAX) 17 g packet Take 17 g by mouth daily.     prochlorperazine (COMPAZINE) 10 MG tablet Take 1 tablet (10 mg total) by mouth every 6 (six) hours as needed for nausea or vomiting. 30 tablet 0   sucralfate (CARAFATE) 1 g tablet Take 1 tablet (1 g total) by mouth 3 (three) times daily. 90 tablet 3   No current facility-administered medications for this visit.    Facility-Administered Medications Ordered in Other Visits  Medication Dose Route Frequency Provider Last Rate Last Dose   heparin lock flush 100 unit/mL  500 Units Intracatheter Once PRN [name], MD       pembrolizumab [facility]) 200 mg in sodium chloride 0.9 % 50 mL chemo infusion  200 mg Intravenous Once [name], MD       sodium chloride flush (NS) 0.9 % injection 10 mL  10 mL Intravenous PRN [name], MD   10 mL at 10/05/18 1026    VITAL SIGNS: There were no vitals taken for this visit. There were no vitals filed for this visit.  Estimated body mass index is 25.04 kg/m as calculated from the following:   Height as of 04/29/19: 6' (1.829 m).   Weight as of an earlier encounter on 06/22/19: 184 lb 9.6 oz (83.7 kg).  LABS: CBC:    Component Value Date/Time   WBC 8.8 06/22/2019 0928   HGB 10.9 (L) 06/22/2019 0928   HCT 34.9 (L) 06/22/2019 0928   PLT 227 06/22/2019 0928   MCV 100.6 (H) 06/22/2019 0928   NEUTROABS 6.6 06/22/2019 0928   LYMPHSABS 1.1 06/22/2019 0928   MONOABS 0.9 06/22/2019 0928   EOSABS 0.2 06/22/2019 0928   BASOSABS 0.1 06/22/2019 0928   Comprehensive Metabolic Panel:    Component Value Date/Time   NA 137 06/22/2019 0928   NA 139 03/23/2012 1029   K 3.9 06/22/2019 0928   K 4.1 03/23/2012 1029   CL 106  06/22/2019 0928   CL 108 (H) 03/23/2012 1029   CO2 22 06/22/2019 0928   CO2 25 03/23/2012 1029   BUN 14 06/22/2019 0928   BUN 16 03/23/2012 1029   CREATININE 0.94 06/22/2019 0928   CREATININE 1.27 03/23/2012 1029   GLUCOSE 118 (H) 06/22/2019 0928   GLUCOSE 83 03/23/2012 1029   CALCIUM 8.4 (L) 06/22/2019 0928   CALCIUM 8.8 03/23/2012 1029   AST 21 06/22/2019 0928   ALT 19 06/22/2019 0928   ALKPHOS 101 06/22/2019 0928   BILITOT 0.5 06/22/2019 0928   PROT 7.2 06/22/2019 0928   ALBUMIN 3.7 06/22/2019 0928    RADIOGRAPHIC STUDIES: Mr Abdomen Wwo Contrast  Result Date: 06/04/2019 CLINICAL DATA:  Stage IV urethral carcinoma. Lesion identified on CT within liver. EXAM: MRI ABDOMEN WITHOUT AND WITH CONTRAST TECHNIQUE: Multiplanar multisequence MR imaging of the abdomen was performed both before and after the administration of intravenous contrast. CONTRAST:  8 mL Gadavist COMPARISON:  CT 05/24/2019, lumbar MRI 03/27/2019 FINDINGS: Lower chest: Lung bases clear Hepatobiliary: Multiple round lesion liver which are hyperintense to the normal liver parenchyma on T2 weighted imaging (series 3). Example lesion in the central LEFT hepatic lobe measures 4.2 cm and corresponds to the 3.9 cm lesion seen on comparison MRI. Multiple additional lesions are evident by MRI imaging. For example 3 adjacent lesions in the inferior RIGHT hepatic lobe measuring 1.8, 2.5 and 1.9 cm on image 19. Approximately 12 lesions in the liver. These lesions demonstrate avid postcontrast enhancement (series 11) Portal veins are patent. A central lesion deforms but does not appear to invade the middle hepatic vein (image 12/series 14). Pancreas: Normal pancreatic parenchymal intensity. No ductal dilatation or inflammation. Spleen: Normal spleen. Adrenals/urinary tract: Adrenal glands are normal. Post RIGHT nephrectomy. LEFT kidney normal with a simple cyst in the upper pole. Stomach/Bowel: Stomach and limited of the small bowel is  unremarkable Vascular/Lymphatic: Abdominal aorta normal caliber. IN the periaortic retroperitoneum just inferior to the LEFT renal vein there is a 2.8 cm nodal mass (image 13/15). There is abnormal enhancement at this level which extends into the adjacent vertebral body. Abnormal enhancement within the L3 and L2 vertebral bodies at this level which extends to the psoas muscles additionally on the RIGHT Musculoskeletal: Concern for metastatic  disease involving L2 and L3 vertebral bodies as described the lymphatic section. IMPRESSION: 1. Multifocal hepatic metastasis involving the both hepatic lobes with approximately 12 round enhancing lesions. 2. Retroperitoneal metastatic adenopathy, with involvement of the psoas muscles as well as the L2 and L3 vertebral body levels. Findings described on MRI 03/27/2019 Electronically Signed   By: Suzy Bouchard M.D.   On: 06/04/2019 10:08   Ct Abdomen Pelvis W Contrast  Result Date: 05/24/2019 CLINICAL DATA:  Recurrent urothelial carcinoma with metastatic disease. Undergoing radiation therapy. Worsening bilateral lower abdominal pain 4 weeks. Previous right nephrectomy for renal cell carcinoma. EXAM: CT ABDOMEN AND PELVIS WITH CONTRAST TECHNIQUE: Multidetector CT imaging of the abdomen and pelvis was performed using the standard protocol following bolus administration of intravenous contrast. CONTRAST:  31mL OMNIPAQUE IOHEXOL 300 MG/ML  SOLN COMPARISON:  03/11/2019 FINDINGS: Lower Chest: Bibasilar pulmonary metastases are again seen, without significant interval change. Diffuse wall thickening of visualized portion of distal esophagus again seen, consistent with esophagitis. Hepatobiliary: Rim enhancing low-attenuation mass in the anterior hepatic dome has increased in size, currently measuring 4.0 x 3.7 cm compared to 2.3 x 2.2 cm previously. No other liver masses are identified. Tiny calcified gallstone is again seen, however there is no evidence of cholecystitis or  biliary ductal dilatation. Pancreas:  No mass or inflammatory changes. Spleen: Within normal limits in size and appearance. Adrenals/Urinary Tract: Previous right nephrectomy. Stable tiny cyst in upper pole of left kidney. No evidence of left renal mass or hydronephrosis. Urinary bladder is nearly empty and not well visualized. Stomach/Bowel: No evidence of obstruction, inflammatory process or abnormal fluid collections. Diffuse colonic diverticulosis is again seen, however there is no evidence of diverticulitis. Normal appendix visualized. Vascular/Lymphatic: Mild retroperitoneal lymphadenopathy in the left para-aortic and aortocaval spaces remains stable, with index lymph node in the left paraaortic region measuring 1.6 cm on image 34/2. Mild right retrocrural lymphadenopathy is stable. Mild bilateral iliac lymphadenopathy is also stable, with largest index lymph node in the right external iliac chain measuring 1.6 cm. No new or increased lymphadenopathy identified. Reproductive: Normal size prostate gland. No masses identified. Penile prosthesis again noted. Other:  None. Musculoskeletal: Lytic metastasis in L2 vertebral body is stable in size. Increased surrounding sclerosis is likely due to post treatment healing response. A new lytic lesion is seen in the anterior L3 vertebral body, also consistent with bone metastasis. IMPRESSION: 1. Increased size of mass in the anterior hepatic dome, consistent with hepatic metastasis. 2. Stable mild retroperitoneal and bilateral iliac lymphadenopathy, consistent with metastatic disease. 3. Stable bibasilar pulmonary metastases. 4. New lytic lesion in L3 vertebral body, consistent with bone metastasis. Stable size of lytic metastasis in L2 vertebral body. 5. Stable distal esophageal wall thickening, consistent with esophagitis. 6. Cholelithiasis. No radiographic evidence of cholecystitis. 7. Colonic diverticulosis. No radiographic evidence of diverticulitis. Electronically  Signed   By: Marlaine Hind M.D.   On: 05/24/2019 12:10   Nm Pet Image Restag (ps) Skull Base To Thigh  Result Date: 06/17/2019 CLINICAL DATA:  Subsequent treatment strategy for uroepithelial carcinoma of the bladder. EXAM: NUCLEAR MEDICINE PET SKULL BASE TO THIGH TECHNIQUE: 10.5 mCi F-18 FDG was injected intravenously. Full-ring PET imaging was performed from the skull base to thigh after the radiotracer. CT data was obtained and used for attenuation correction and anatomic localization. Fasting blood glucose: 90 mg/dl COMPARISON:  Abdominal MRI 06/04/2019. Abdomen/pelvis CT 05/24/2019. PET-CT 06/29/2018. FINDINGS: Mediastinal blood pool activity: SUV max  2.1 Liver activity: SUV max NA  NECK: No hypermetabolic lymph nodes in the neck. Incidental CT findings: none CHEST: Hypermetabolic lymphadenopathy is seen in the left supraclavicular region. Index 9 mm short axis node on image 63/series 3 demonstrates SUV max = 9.0 hypermetabolic left supraclavicular node on 73/3 demonstrates SUV max = 12.1. No evidence for hypermetabolic mediastinal or hilar lymphadenopathy. No hypermetabolic axillary adenopathy. Hypermetabolic pulmonary nodules are identified in the lungs bilaterally. Index 8 mm anterior right upper lobe nodule (118/3) demonstrates SUV max = 5.9. 14 mm irregular nodule posterior right lower lobe on 01/30 6/3 has SUV max = 6.0. Incidental CT findings: Coronary artery calcification is evident. Atherosclerotic calcification is noted in the wall of the thoracic aorta. Right Port-A-Cath tip is positioned at the SVC/RA junction. Centrilobular and paraseptal emphysema evident. ABDOMEN/PELVIS: Bulky hypermetabolic liver metastases are evident. A dominant lesion in the dome of liver seen on recent MRI at 4.2 cm demonstrates SUV max = 10.2. Posterior right hepatic lesion measuring 2.5 cm on image 155/3 has SUV max = 10.2. 2.7 cm lesion inferior right liver on 192/3 demonstrates SUV max = 9.8. Hypermetabolic  lymphadenopathy noted in the right retrocrural space. Retroperitoneal lymphadenopathy is hypermetabolic. Index 17 mm left para-aortic node on 191/3 demonstrates SUV max = 17.4. Hypermetabolic retroperitoneal lymphadenopathy extends into the pelvis with hypermetabolic lymph nodes identified in the right pelvic sidewall. Index 15 mm short axis right pelvic sidewall node on 248/3 has SUV max = 17.8. Incidental CT findings: Tiny calcified gallstone evident. Right kidney surgically absent. Diffuse colonic diverticulosis without diverticulitis. Penile prosthetic device evident. There is abdominal aortic atherosclerosis without aneurysm. SKELETON: Hypermetabolic lesion noted inferior right scapula with SUV max = 3.8. Mixed lytic and sclerotic lesion in the L2 vertebral body is hypermetabolic with SUV max = 6.6 metastatic lesion noted L3 level. Incidental CT findings: Mild degenerative changes noted in the hips. IMPRESSION: 1. Hypermetabolic nodal metastases identified in the left supraclavicular space, right retrocrural space, abdominal retroperitoneal space, and extraperitoneal nodal chains of the posterior pelvis and right pelvic sidewall. 2. Hypermetabolic pulmonary nodules consistent with metastatic disease. 3. Bulky hypermetabolic liver metastases. 4. Hypermetabolic bone lesions including the inferior right scapula and L2 vertebral body consistent with metastatic involvement. 5. Cholelithiasis. 6.  Aortic Atherosclerois (ICD10-170.0) Electronically Signed   By: Misty Stanley M.D.   On: 06/17/2019 15:49   Ir Radiologist Eval & Mgmt  Result Date: 06/09/2019 Please refer to notes tab for details about interventional procedure. (Op Note)  Ir Radiologist Eval & Mgmt  Result Date: 06/01/2019 Please refer to notes tab for details about interventional procedure. (Op Note)   PERFORMANCE STATUS (ECOG) : 1 - Symptomatic but completely ambulatory  Review of Systems Unless otherwise noted, a complete review of systems  is negative.  Physical Exam General: NAD, frail appearing, thin Pulmonary: Unlabored Extremities: no edema Skin: no rashes Neurological: Weakness but otherwise nonfocal  IMPRESSION: Follow-up visit with patient today in the clinic.  Patient reports that symptomatically he is doing reasonably well.  He has occasional abdominal pain in the morning but says it resolves without use of medication when he gets up and starts moving around.  He denies other distressing symptoms such as nausea or vomiting.  He reports oral intake is slightly improved.  He says he is eating about 50% of meals.  Weight appears stable 184 pounds.  He has been evaluated by our dietitian and plans to start oral supplements.  Patient was also seen today by Dr. Grayland Ormond.  Results of PET scan were reviewed  with patient and daughter and it appears that there is widely metastatic disease involving multiple lymph nodes and supraclavicular, peritoneal, and pelvic wall.  Metastatic disease is also present in the liver, scapula, and L2 vertebral body.  Plan is to continue [place] but future treatment options would likely be limited due to history of progression on chemotherapy.  Patient remains committed to treatment.  We again discussed completion of a MOST Form and I attempted to elicit decision making for end-of-life care but patient says that he needs to speak more with his daughter.  He is in process of completing ACP documents and I asked that he bring those to the hospital so that we may scan those into the chart.  I called and spoke with his daughter.  She was asking about his prognosis going forward.  I encouraged her to speak more with Dr. Grayland Ormond at time of next visit to get his input.  She says that she also plans to speak with her father about decision-making.  PLAN: -Continue current scope of treatment -MOST Form reviewed -Patient completing ACP documents -RTC in 3 weeks   Patient expressed understanding and  was in agreement with this plan. He also understands that He can call the clinic at any time with any questions, concerns, or complaints.     Time Total: 30 minutes  Visit consisted of counseling and education dealing with the complex and emotionally intense issues of symptom management and palliative care in the setting of serious and potentially life-threatening illness.Greater than 50%  of this time was spent counseling and coordinating care related to the above assessment and plan.  Signed by: Altha Harm, PhD, NP-C [phone] (Work Cell)

## 2019-06-23 ENCOUNTER — Other Ambulatory Visit (HOSPITAL_COMMUNITY): Payer: Self-pay | Admitting: Interventional Radiology

## 2019-06-23 ENCOUNTER — Telehealth: Payer: Self-pay | Admitting: *Deleted

## 2019-06-23 DIAGNOSIS — C787 Secondary malignant neoplasm of liver and intrahepatic bile duct: Secondary | ICD-10-CM

## 2019-06-23 LAB — THYROID PANEL WITH TSH
Free Thyroxine Index: 2 (ref 1.2–4.9)
T3 Uptake Ratio: 27 % (ref 24–39)
T4, Total: 7.3 ug/dL (ref 4.5–12.0)
TSH: 2.92 u[IU]/mL (ref 0.450–4.500)

## 2019-06-23 NOTE — Telephone Encounter (Signed)
Message forwarded to schedulers and I called IR to let them know we are moving our appt

## 2019-06-23 NOTE — Telephone Encounter (Signed)
Tiffany with IR, Dr Pascal Lux called stating they are setting patient up for his Y90 and the dates so that all entities will be available, but the first one coincides with an appointment with [place] on 10/6. She is requesting we move our appointment to another day (lab/ doctor/ Keytruda) and call her so she can call patient with their appts. [phone]

## 2019-06-23 NOTE — Telephone Encounter (Signed)
We can move our appt to the day before.

## 2019-07-09 ENCOUNTER — Telehealth: Payer: Self-pay | Admitting: *Deleted

## 2019-07-09 ENCOUNTER — Other Ambulatory Visit: Payer: Self-pay | Admitting: Oncology

## 2019-07-09 ENCOUNTER — Other Ambulatory Visit: Payer: Self-pay

## 2019-07-09 MED ORDER — LINACLOTIDE 72 MCG PO CAPS: 72.0000 ug | ORAL_CAPSULE | Freq: Every day | ORAL | 0 refills | Status: DC

## 2019-07-09 NOTE — Telephone Encounter (Signed)
Daughter Caryl Pina called requesting we order Linzess or something similar for this patient with constipation. Please advise

## 2019-07-09 NOTE — Progress Notes (Signed)
Patient is experincing some constipation and would like a "pill " to take daily for that.

## 2019-07-09 NOTE — Telephone Encounter (Signed)
I will call her.   Faythe Casa, NP 07/09/2019 11:13 AM

## 2019-07-09 NOTE — Progress Notes (Signed)
Linzess ordered for constipation.  Faythe Casa, NP 07/09/2019 3:56 PM

## 2019-07-12 ENCOUNTER — Inpatient Hospital Stay (HOSPITAL_BASED_OUTPATIENT_CLINIC_OR_DEPARTMENT_OTHER): Payer: Medicare Other | Admitting: Oncology

## 2019-07-12 ENCOUNTER — Inpatient Hospital Stay: Payer: Medicare Other | Admitting: Hospice and Palliative Medicine

## 2019-07-12 ENCOUNTER — Inpatient Hospital Stay: Payer: Medicare Other | Attending: Oncology

## 2019-07-12 ENCOUNTER — Inpatient Hospital Stay: Payer: Medicare Other

## 2019-07-12 ENCOUNTER — Other Ambulatory Visit: Payer: Self-pay | Admitting: Radiology

## 2019-07-12 ENCOUNTER — Other Ambulatory Visit: Payer: Self-pay

## 2019-07-12 ENCOUNTER — Other Ambulatory Visit: Payer: Self-pay | Admitting: Student

## 2019-07-12 VITALS — BP 128/74 | HR 85 | Temp 98.1°F | Resp 18 | Wt 181.8 lb

## 2019-07-12 DIAGNOSIS — C7951 Secondary malignant neoplasm of bone: Secondary | ICD-10-CM | POA: Diagnosis not present

## 2019-07-12 DIAGNOSIS — C679 Malignant neoplasm of bladder, unspecified: Secondary | ICD-10-CM | POA: Diagnosis not present

## 2019-07-12 DIAGNOSIS — Z79899 Other long term (current) drug therapy: Secondary | ICD-10-CM | POA: Insufficient documentation

## 2019-07-12 DIAGNOSIS — Z95828 Presence of other vascular implants and grafts: Secondary | ICD-10-CM

## 2019-07-12 DIAGNOSIS — G4700 Insomnia, unspecified: Secondary | ICD-10-CM | POA: Diagnosis not present

## 2019-07-12 DIAGNOSIS — K5900 Constipation, unspecified: Secondary | ICD-10-CM

## 2019-07-12 DIAGNOSIS — C770 Secondary and unspecified malignant neoplasm of lymph nodes of head, face and neck: Secondary | ICD-10-CM | POA: Diagnosis not present

## 2019-07-12 DIAGNOSIS — I2510 Atherosclerotic heart disease of native coronary artery without angina pectoris: Secondary | ICD-10-CM | POA: Diagnosis not present

## 2019-07-12 DIAGNOSIS — Z5112 Encounter for antineoplastic immunotherapy: Secondary | ICD-10-CM | POA: Diagnosis not present

## 2019-07-12 LAB — CBC WITH DIFFERENTIAL/PLATELET
Abs Immature Granulocytes: 0.05 10*3/uL (ref 0.00–0.07)
Basophils Absolute: 0.1 10*3/uL (ref 0.0–0.1)
Basophils Relative: 1 %
Eosinophils Absolute: 0.1 10*3/uL (ref 0.0–0.5)
Eosinophils Relative: 1 %
HCT: 36.3 % — ABNORMAL LOW (ref 39.0–52.0)
Hemoglobin: 11.5 g/dL — ABNORMAL LOW (ref 13.0–17.0)
Immature Granulocytes: 1 %
Lymphocytes Relative: 14 %
Lymphs Abs: 1.4 10*3/uL (ref 0.7–4.0)
MCH: 31 pg (ref 26.0–34.0)
MCHC: 31.7 g/dL (ref 30.0–36.0)
MCV: 97.8 fL (ref 80.0–100.0)
Monocytes Absolute: 1.1 10*3/uL — ABNORMAL HIGH (ref 0.1–1.0)
Monocytes Relative: 12 %
Neutro Abs: 6.9 10*3/uL (ref 1.7–7.7)
Neutrophils Relative %: 71 %
Platelets: 168 10*3/uL (ref 150–400)
RBC: 3.71 MIL/uL — ABNORMAL LOW (ref 4.22–5.81)
RDW: 16.4 % — ABNORMAL HIGH (ref 11.5–15.5)
WBC: 9.6 10*3/uL (ref 4.0–10.5)
nRBC: 0 % (ref 0.0–0.2)

## 2019-07-12 LAB — COMPREHENSIVE METABOLIC PANEL
ALT: 40 U/L (ref 0–44)
AST: 35 U/L (ref 15–41)
Albumin: 3.4 g/dL — ABNORMAL LOW (ref 3.5–5.0)
Alkaline Phosphatase: 142 U/L — ABNORMAL HIGH (ref 38–126)
Anion gap: 10 (ref 5–15)
BUN: 18 mg/dL (ref 8–23)
CO2: 22 mmol/L (ref 22–32)
Calcium: 8.7 mg/dL — ABNORMAL LOW (ref 8.9–10.3)
Chloride: 103 mmol/L (ref 98–111)
Creatinine, Ser: 1.03 mg/dL (ref 0.61–1.24)
GFR calc Af Amer: >60 mL/min (ref >60)
GFR calc non Af Amer: >60 mL/min (ref >60)
Glucose, Bld: 119 mg/dL — ABNORMAL HIGH (ref 70–99)
Potassium: 3.9 mmol/L (ref 3.5–5.1)
Sodium: 135 mmol/L (ref 135–145)
Total Bilirubin: 0.5 mg/dL (ref 0.3–1.2)
Total Protein: 7.2 g/dL (ref 6.5–8.1)

## 2019-07-12 MED ORDER — PANTOPRAZOLE SODIUM 40 MG IV SOLR: 40.0000 mg | Freq: Once | INTRAVENOUS | Status: DC

## 2019-07-12 MED ORDER — SODIUM CHLORIDE 0.9 % IV SOLN: 8.0000 mg | Freq: Once | INTRAVENOUS | Status: DC

## 2019-07-12 MED ORDER — TRAZODONE HCL 50 MG PO TABS: 50.0000 mg | ORAL_TABLET | Freq: Every day | ORAL | 0 refills | Status: AC

## 2019-07-12 MED ORDER — DEXAMETHASONE SODIUM PHOSPHATE 10 MG/ML IJ SOLN: 8.0000 mg | Freq: Once | INTRAMUSCULAR | Status: DC

## 2019-07-12 MED ORDER — PIPERACILLIN-TAZOBACTAM 3.375 G IVPB 30 MIN: 3.3750 g | Freq: Once | INTRAVENOUS | Status: DC

## 2019-07-12 MED ORDER — SODIUM CHLORIDE 0.9 % IV SOLN
Freq: Once | INTRAVENOUS | Status: AC
  Administered 2019-07-12: 11:00:00 via INTRAVENOUS
  Filled 2019-07-12: qty 250

## 2019-07-12 MED ORDER — SODIUM CHLORIDE 0.9% FLUSH
10.0000 mL | Freq: Once | INTRAVENOUS | Status: AC
  Administered 2019-07-12: 10:00:00 10 mL via INTRAVENOUS
  Filled 2019-07-12: qty 10

## 2019-07-12 MED ORDER — SODIUM CHLORIDE 0.9 % IV SOLN
200.0000 mg | Freq: Once | INTRAVENOUS | Status: AC
  Administered 2019-07-12: 11:00:00 200 mg via INTRAVENOUS
  Filled 2019-07-12: qty 8

## 2019-07-12 NOTE — Progress Notes (Signed)
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  INTERVAL HISTORY: Patient returns to clinic today for further evaluation and consideration of cycle 3 of palliative Keytruda.  Patient continues to have lower abdominal pain that typically bothers him at nighttime.  Endorses significant constipation with only small BMs every other day.[name]  He is taking most of his pain medication at bedtime and throughout the night.  He is not sleeping well due to abdominal pain.  He was recently given Linzess to help with constipation which was too expensive.  He denies any nausea or vomiting or diarrhea.  He denies any fevers or illnesses, chest pain, shortness of breath, cough, hemoptysis or urinary concerns.   REVIEW OF SYSTEMS:   Review of Systems  Constitutional: Positive for malaise/fatigue. Negative for chills, fever and weight loss.  HENT: Negative for congestion, ear pain and tinnitus.   Eyes: Negative.  Negative for blurred vision and double vision.  Respiratory: Negative.  Negative for cough, sputum production and shortness of breath.   Cardiovascular: Negative.  Negative for chest pain, palpitations and leg swelling.  Gastrointestinal: Positive for abdominal pain and constipation. Negative for diarrhea, nausea and vomiting.  Genitourinary: Negative for dysuria, frequency and urgency.  Musculoskeletal: Negative for back pain and falls.  Skin: Negative.  Negative for rash.  Neurological: Positive for weakness. Negative for headaches.  Endo/Heme/Allergies: Negative.  Does not bruise/bleed easily.    Psychiatric/Behavioral: Positive for depression. The patient has insomnia. The patient is not nervous/anxious.     As per HPI. Otherwise, a complete review of systems is negative.  PAST MEDICAL HISTORY: Past Medical History:  Diagnosis Date   Cancer [facility]) 2013   Right Renal    Hyperlipidemia    Hypertension    Renal disorder    Urothelial carcinoma of bladder ([place]) 2020    PAST SURGICAL HISTORY: Past Surgical History:  Procedure Laterality Date   CORONARY STENT INTERVENTION N/A 09/28/2018   Procedure: CORONARY STENT INTERVENTION;  Surgeon: [name], MD;  Location: [facility];  Service: Cardiovascular;  Laterality: N/A;   CORONARY STENT PLACEMENT     IR FLUORO GUIDE CV LINE RIGHT  04/03/2018   IR RADIOLOGIST EVAL & MGMT  06/01/2019   IR RADIOLOGIST EVAL & MGMT  06/09/2019   kidney removed Left 2013   LEFT HEART CATH AND CORONARY ANGIOGRAPHY N/A 09/28/2018   Procedure: LEFT HEART CATH AND CORONARY ANGIOGRAPHY;  Surgeon: [name], MD;  Location: [place] [facility];  Service: Cardiovascular;  Laterality: N/A;    FAMILY HISTORY: Family History  Problem Relation Age of Onset   Coronary artery disease Mother    Anemia Mother    Kidney failure Mother    Subarachnoid hemorrhage Father    Breast cancer Sister 59   Hypertension Brother     ADVANCED DIRECTIVES (Y/N):  N  HEALTH MAINTENANCE: Social History   Tobacco Use   Smoking status: Former Smoker    Packs/day: 1.00    Years: 55.00    Pack years: 55.00    Types: Cigarettes    Quit date: 03/20/2012    Years since quitting: 7.3   Smokeless tobacco:  Never Used  Substance Use Topics   Alcohol use: Yes    Comment: Social   Drug use: No     Colonoscopy:  PAP:  Bone density:  Lipid panel:  Allergies  Allergen Reactions   Clopidogrel Rash and Hives   Rosuvastatin Calcium Anaphylaxis   Etodolac Nausea And Vomiting and Other (See Comments)    Bad dreams Bad  dreams    Hydrocodone Other (See Comments)    Bad dreams Bad dreams    Other    Statins    Meloxicam Rash    Bad dreams Bad dreams    Pravastatin Sodium Rash    Current Outpatient Medications  Medication Sig Dispense Refill   carvedilol (COREG) 6.25 MG tablet Take 1 tablet (6.25 mg total) by mouth 2 (two) times daily. 60 tablet 6   docusate sodium (COLACE) 100 MG capsule Take 100 mg by mouth 2 (two) times daily as needed for mild constipation. 2 in am, 3 in pm     ezetimibe (ZETIA) 10 MG tablet TAKE 1 TABLET BY MOUTH DAILY 90 tablet 0   Garlic [id number] MG TABS Take by mouth.      HYDROcodone-acetaminophen (NORCO/VICODIN) 5-325 MG tablet Take 1 tablet by mouth every 6 (six) hours as needed for moderate pain. 120 tablet 0   magnesium hydroxide (MILK OF MAGNESIA) 400 MG/5ML suspension Take 30 mLs by mouth.     megestrol (MEGACE) 20 MG tablet Take 1 tablet (20 mg total) by mouth daily. 30 tablet 1   metoCLOPramide (REGLAN) 10 MG tablet Take 1 tablet (10 mg total) by mouth 4 (four) times daily. 30 tablet 1   Multiple Vitamin (MULTI-VITAMINS) TABS Take by mouth.     nitroGLYCERIN (NITROSTAT) 0.4 MG SL tablet Place under the tongue.     ondansetron (ZOFRAN) 8 MG tablet Take 1 tablet (8 mg total) by mouth every 8 (eight) hours as needed for nausea or vomiting. 30 tablet 3   prochlorperazine (COMPAZINE) 10 MG tablet Take 1 tablet (10 mg total) by mouth every 6 (six) hours as needed for nausea or vomiting. 30 tablet 0   sucralfate (CARAFATE) 1 g tablet Take 1 tablet (1 g total) by mouth 3 (three) times daily. 90 tablet 3   acetaminophen (TYLENOL) 325 MG tablet Take 325 mg by mouth every 4 (four) hours as needed.      ALPRAZolam (XANAX) 0.25 MG tablet      amLODipine (NORVASC) 10 MG tablet TAKE 1 TABLET BY MOUTH EVERY DAY     aspirin EC 81 MG tablet Take by mouth.     clopidogrel (PLAVIX) 75 MG tablet Take 75 mg by mouth daily.     famotidine (PEPCID) 20 MG tablet Take 20  mg by mouth 2 (two) times daily.      fentaNYL (DURAGESIC) 25 MCG/HR Place 1 patch onto the skin every 3 (three) days. (Patient not taking: Reported on 07/09/2019) 10 patch 0   linaclotide (LINZESS) 72 MCG capsule Take 1 capsule (72 mcg total) by mouth daily before breakfast. (Patient not taking: Reported on 07/12/2019) 30 capsule 0   Omega-3 Fatty Acids (FISH OIL OMEGA-3) 1000 MG CAPS Take by mouth daily.     polyethylene glycol (MIRALAX / GLYCOLAX) 17 g packet Take 17 g by mouth 2 (two) times daily.      traZODone (DESYREL) 50 MG tablet Take 1-2 tablets (50-100 mg total) by mouth at bedtime. 30 tablet 0   No current facility-administered medications for this visit.  Facility-Administered Medications Ordered in Other Visits  Medication Dose Route Frequency Provider Last Rate Last Dose   pembrolizumab (KEYTRUDA) 200 mg in sodium chloride 0.9 % 50 mL chemo infusion  200 mg Intravenous Once [name], MD       sodium chloride flush (NS) 0.9 % injection 10 mL  10 mL Intravenous PRN [name], MD   10 mL at 10/05/18 1026    OBJECTIVE: Vitals:   07/12/19 1009  BP: 128/74  Pulse: 85  Resp: 18  Temp: 98.1 F (36.7 C)  SpO2: 100%     Body mass index is 24.66 kg/m.    ECOG FS:0 - Asymptomatic   Physical Exam Constitutional:      Appearance: Normal appearance.     Comments: Appears tired/fatigued  HENT:     Head: Normocephalic and atraumatic.  Eyes:     Pupils: Pupils are equal, round, and reactive to light.  Neck:     Musculoskeletal: Normal range of motion.  Cardiovascular:     Rate and Rhythm: Normal rate and regular rhythm.     Heart sounds: Normal heart sounds. No murmur.  Pulmonary:     Effort: Pulmonary effort is normal.     Breath sounds: Normal breath sounds. No wheezing.  Abdominal:     General: Bowel sounds are normal. There is no distension.     Palpations: Abdomen is soft.     Tenderness: There is no abdominal tenderness.     Comments: No  tenderness on exam.  States it occurs mostly at bedtime.  Musculoskeletal: Normal range of motion.  Skin:    General: Skin is warm and dry.     Findings: No rash.  Neurological:     Mental Status: He is alert and oriented to person, place, and time.  Psychiatric:        Judgment: Judgment normal.      LAB RESULTS:  Lab Results  Component Value Date   NA 135 07/12/2019   K 3.9 07/12/2019   CL 103 07/12/2019   CO2 22 07/12/2019   GLUCOSE 119 (H) 07/12/2019   BUN 18 07/12/2019   CREATININE 1.03 07/12/2019   CALCIUM 8.7 (L) 07/12/2019   PROT 7.2 07/12/2019   ALBUMIN 3.4 (L) 07/12/2019   AST 35 07/12/2019   ALT 40 07/12/2019   ALKPHOS 142 (H) 07/12/2019   BILITOT 0.5 07/12/2019   GFRNONAA >60 07/12/2019   GFRAA >60 07/12/2019    Lab Results  Component Value Date   WBC 9.6 07/12/2019   NEUTROABS 6.9 07/12/2019   HGB 11.5 (L) 07/12/2019   HCT 36.3 (L) 07/12/2019   MCV 97.8 07/12/2019   PLT 168 07/12/2019     STUDIES: Nm Pet Image Restag (ps) Skull Base To Thigh  Result Date: 06/17/2019 CLINICAL DATA:  Subsequent treatment strategy for uroepithelial carcinoma of the bladder. EXAM: NUCLEAR MEDICINE PET SKULL BASE TO THIGH TECHNIQUE: 10.5 mCi F-18 FDG was injected intravenously. Full-ring PET imaging was performed from the skull base to thigh after the radiotracer. CT data was obtained and used for attenuation correction and anatomic localization. Fasting blood glucose: 90 mg/dl COMPARISON:  Abdominal MRI 06/04/2019. Abdomen/pelvis CT 05/24/2019. PET-CT 06/29/2018. FINDINGS: Mediastinal blood pool activity: SUV max  2.1 Liver activity: SUV max NA NECK: No hypermetabolic lymph nodes in the neck. Incidental CT findings: none CHEST: Hypermetabolic lymphadenopathy is seen in the left supraclavicular region. Index 9 mm short axis node on image 63/series 3 demonstrates SUV max = 9.0 hypermetabolic  left supraclavicular node on 73/3 demonstrates SUV max = 12.1. No evidence for  hypermetabolic mediastinal or hilar lymphadenopathy. No hypermetabolic axillary adenopathy. Hypermetabolic pulmonary nodules are identified in the lungs bilaterally. Index 8 mm anterior right upper lobe nodule (118/3) demonstrates SUV max = 5.9. 14 mm irregular nodule posterior right lower lobe on 01/30 6/3 has SUV max = 6.0. Incidental CT findings: Coronary artery calcification is evident. Atherosclerotic calcification is noted in the wall of the thoracic aorta. Right Port-A-Cath tip is positioned at the SVC/RA junction. Centrilobular and paraseptal emphysema evident. ABDOMEN/PELVIS: Bulky hypermetabolic liver metastases are evident. A dominant lesion in the dome of liver seen on recent MRI at 4.2 cm demonstrates SUV max = 10.2. Posterior right hepatic lesion measuring 2.5 cm on image 155/3 has SUV max = 10.2. 2.7 cm lesion inferior right liver on 192/3 demonstrates SUV max = 9.8. Hypermetabolic lymphadenopathy noted in the right retrocrural space. Retroperitoneal lymphadenopathy is hypermetabolic. Index 17 mm left para-aortic node on 191/3 demonstrates SUV max = 17.4. Hypermetabolic retroperitoneal lymphadenopathy extends into the pelvis with hypermetabolic lymph nodes identified in the right pelvic sidewall. Index 15 mm short axis right pelvic sidewall node on 248/3 has SUV max = 17.8. Incidental CT findings: Tiny calcified gallstone evident. Right kidney surgically absent. Diffuse colonic diverticulosis without diverticulitis. Penile prosthetic device evident. There is abdominal aortic atherosclerosis without aneurysm. SKELETON: Hypermetabolic lesion noted inferior right scapula with SUV max = 3.8. Mixed lytic and sclerotic lesion in the L2 vertebral body is hypermetabolic with SUV max = 6.6 metastatic lesion noted L3 level. Incidental CT findings: Mild degenerative changes noted in the hips. IMPRESSION: 1. Hypermetabolic nodal metastases identified in the left supraclavicular space, right retrocrural space,  abdominal retroperitoneal space, and extraperitoneal nodal chains of the posterior pelvis and right pelvic sidewall. 2. Hypermetabolic pulmonary nodules consistent with metastatic disease. 3. Bulky hypermetabolic liver metastases. 4. Hypermetabolic bone lesions including the inferior right scapula and L2 vertebral body consistent with metastatic involvement. 5. Cholelithiasis. 6.  Aortic Atherosclerois (ICD10-170.0) Electronically Signed   By: Misty Stanley M.D.   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  PDL 1 0%  PLAN:    1.  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  PET scan results from June 17, 2019 reviewed independently revealing widespread, progressive metastatic disease  Patient has a new lytic lesion in his L3 vertebral body.  Patient initiated palliative Keytruda 3 weeks ago.  He expressed understanding that his treatment options are limited at this point.  Appreciate interventional radiology input.  Plan is to consider Y 90 ablation of symptomatic hepatic metastasis.  Proceed with cycle 3 of Keytruda today.  Return to clinic in 3 weeks for further evaluation and consideration of cycle 4.   2.  Genetic testing: Negative. 3.  Kidney function: Patient's creatinine continues to be within normal limits. 4.  Cardiac disease: Continue follow-up with cardiology as indicated. 5.  Pain: MRI results from March 27, 2019 reviewed independently consistent with metastatic disease.  Patient has now completed XRT, but still has residual pain.  He has discontinued his fentanyl patch secondary to nausea, but will consider retrying fentanyl patch at a lower dose of 12 mcg every 72 hours if his pain persists.  Continue oxycodone as prescribed. 6.  Constipation: Patient will try magnesium  citrate.  He will initiate a  bowel regimen including MiraLAX twice daily and Senokot twice daily.  Per patient, Linzess is too expensive. 7.  Bony metastasis: Patient last received Zometa on May 06, 2019. 8.  Abdominal pain: Likely due to constipation and disease.  Start on bowel regiment ASAP to see if this helps with pain.  Continue pain medication as prescribed. 9.  Leukopenia: Resolved. 10.  Anemia: Chronic and unchanged with hemoglobin 11.5. 11. Insomnia: Likely due to abdominal pain that occurs mainly at bedtime.  Will prescribe trazodone 50 to 100 mg as needed for sleep.  Spoke to   Patient expressed understanding and was in agreement with this plan. He also understands that He can call clinic at any time with any questions, concerns, or complaints.   Cancer Staging Urothelial carcinoma of bladder [facility]) Staging form: Urinary Bladder, AJCC 8th Edition - Clinical: Stage IVA [place], cN2, cM1a) - Signed by [name], MD on 04/09/2018   Jacquelin Hawking, NP   07/12/2019 10:54 AM

## 2019-07-13 ENCOUNTER — Ambulatory Visit (HOSPITAL_COMMUNITY)
Admission: RE | Admit: 2019-07-13 | Discharge: 2019-07-13 | Disposition: A | Discharge status: Home / Self Care | Payer: Medicare Other | Source: Ambulatory Visit | Attending: Interventional Radiology | Admitting: Interventional Radiology

## 2019-07-13 ENCOUNTER — Encounter (HOSPITAL_COMMUNITY): Payer: Self-pay

## 2019-07-13 ENCOUNTER — Ambulatory Visit: Payer: Medicare Other | Admitting: Oncology

## 2019-07-13 ENCOUNTER — Other Ambulatory Visit: Payer: Medicare Other

## 2019-07-13 ENCOUNTER — Other Ambulatory Visit (HOSPITAL_COMMUNITY): Payer: Self-pay | Admitting: Interventional Radiology

## 2019-07-13 ENCOUNTER — Ambulatory Visit: Payer: Medicare Other

## 2019-07-13 ENCOUNTER — Encounter: Payer: Medicare Other | Admitting: Hospice and Palliative Medicine

## 2019-07-13 DIAGNOSIS — C787 Secondary malignant neoplasm of liver and intrahepatic bile duct: Secondary | ICD-10-CM | POA: Diagnosis not present

## 2019-07-13 DIAGNOSIS — Z87891 Personal history of nicotine dependence: Secondary | ICD-10-CM | POA: Insufficient documentation

## 2019-07-13 DIAGNOSIS — C679 Malignant neoplasm of bladder, unspecified: Secondary | ICD-10-CM | POA: Insufficient documentation

## 2019-07-13 DIAGNOSIS — Z7902 Long term (current) use of antithrombotics/antiplatelets: Secondary | ICD-10-CM | POA: Insufficient documentation

## 2019-07-13 DIAGNOSIS — Z885 Allergy status to narcotic agent status: Secondary | ICD-10-CM | POA: Diagnosis not present

## 2019-07-13 DIAGNOSIS — Z7982 Long term (current) use of aspirin: Secondary | ICD-10-CM | POA: Insufficient documentation

## 2019-07-13 DIAGNOSIS — Z79899 Other long term (current) drug therapy: Secondary | ICD-10-CM | POA: Insufficient documentation

## 2019-07-13 DIAGNOSIS — Z955 Presence of coronary angioplasty implant and graft: Secondary | ICD-10-CM | POA: Insufficient documentation

## 2019-07-13 DIAGNOSIS — E785 Hyperlipidemia, unspecified: Secondary | ICD-10-CM | POA: Insufficient documentation

## 2019-07-13 DIAGNOSIS — Z888 Allergy status to other drugs, medicaments and biological substances status: Secondary | ICD-10-CM | POA: Insufficient documentation

## 2019-07-13 DIAGNOSIS — I10 Essential (primary) hypertension: Secondary | ICD-10-CM | POA: Diagnosis not present

## 2019-07-13 DIAGNOSIS — Z8249 Family history of ischemic heart disease and other diseases of the circulatory system: Secondary | ICD-10-CM | POA: Insufficient documentation

## 2019-07-13 HISTORY — PX: IR EMBO ARTERIAL NOT HEMORR HEMANG INC GUIDE ROADMAPPING: IMG5448

## 2019-07-13 HISTORY — PX: IR ANGIOGRAM VISCERAL SELECTIVE: IMG657

## 2019-07-13 HISTORY — PX: IR US GUIDE VASC ACCESS RIGHT: IMG2390

## 2019-07-13 HISTORY — PX: IR ANGIOGRAM SELECTIVE EACH ADDITIONAL VESSEL: IMG667

## 2019-07-13 LAB — THYROID PANEL WITH TSH
Free Thyroxine Index: 1.9 (ref 1.2–4.9)
T3 Uptake Ratio: 28 % (ref 24–39)
T4, Total: 6.7 ug/dL (ref 4.5–12.0)
TSH: 3.35 u[IU]/mL (ref 0.450–4.500)

## 2019-07-13 LAB — CBC
HCT: 37.4 % — ABNORMAL LOW (ref 39.0–52.0)
Hemoglobin: 11.6 g/dL — ABNORMAL LOW (ref 13.0–17.0)
MCH: 31.2 pg (ref 26.0–34.0)
MCHC: 31 g/dL (ref 30.0–36.0)
MCV: 100.5 fL — ABNORMAL HIGH (ref 80.0–100.0)
Platelets: 191 10*3/uL (ref 150–400)
RBC: 3.72 MIL/uL — ABNORMAL LOW (ref 4.22–5.81)
RDW: 16.5 % — ABNORMAL HIGH (ref 11.5–15.5)
WBC: 9.1 10*3/uL (ref 4.0–10.5)
nRBC: 0 % (ref 0.0–0.2)

## 2019-07-13 LAB — COMPREHENSIVE METABOLIC PANEL
ALT: 48 U/L — ABNORMAL HIGH (ref 0–44)
AST: 41 U/L (ref 15–41)
Albumin: 3.4 g/dL — ABNORMAL LOW (ref 3.5–5.0)
Alkaline Phosphatase: 170 U/L — ABNORMAL HIGH (ref 38–126)
Anion gap: 10 (ref 5–15)
BUN: 18 mg/dL (ref 8–23)
CO2: 22 mmol/L (ref 22–32)
Calcium: 8.8 mg/dL — ABNORMAL LOW (ref 8.9–10.3)
Chloride: 102 mmol/L (ref 98–111)
Creatinine, Ser: 0.95 mg/dL (ref 0.61–1.24)
GFR calc Af Amer: >60 mL/min (ref >60)
GFR calc non Af Amer: >60 mL/min (ref >60)
Glucose, Bld: 94 mg/dL (ref 70–99)
Potassium: 4.2 mmol/L (ref 3.5–5.1)
Sodium: 134 mmol/L — ABNORMAL LOW (ref 135–145)
Total Bilirubin: 0.3 mg/dL (ref 0.3–1.2)
Total Protein: 6.9 g/dL (ref 6.5–8.1)

## 2019-07-13 LAB — PROTIME-INR
INR: 1 (ref 0.8–1.2)
Prothrombin Time: 12.7 seconds (ref 11.4–15.2)

## 2019-07-13 MED ORDER — SODIUM CHLORIDE 0.9 % IV SOLN
8.0000 mg | Freq: Once | INTRAVENOUS | Status: AC
  Administered 2019-07-13: 09:00:00 8 mg via INTRAVENOUS
  Filled 2019-07-13: qty 4

## 2019-07-13 MED ORDER — FENTANYL CITRATE (PF) 100 MCG/2ML IJ SOLN
INTRAMUSCULAR | Status: DC | PRN
  Administered 2019-07-13 (×2): 50 ug via INTRAVENOUS

## 2019-07-13 MED ORDER — MIDAZOLAM HCL 2 MG/2ML IJ SOLN
INTRAMUSCULAR | Status: AC
  Filled 2019-07-13: qty 4

## 2019-07-13 MED ORDER — FENTANYL CITRATE (PF) 100 MCG/2ML IJ SOLN
INTRAMUSCULAR | Status: AC
  Filled 2019-07-13: qty 2

## 2019-07-13 MED ORDER — LIDOCAINE HCL 1 % IJ SOLN
INTRAMUSCULAR | Status: AC
  Filled 2019-07-13: qty 20

## 2019-07-13 MED ORDER — MIDAZOLAM HCL 2 MG/2ML IJ SOLN
INTRAMUSCULAR | Status: DC | PRN
  Administered 2019-07-13 (×3): 1 mg via INTRAVENOUS

## 2019-07-13 MED ORDER — PIPERACILLIN-TAZOBACTAM 3.375 G IVPB 30 MIN
3.3750 g | Freq: Once | INTRAVENOUS | Status: AC
  Administered 2019-07-13: 10:00:00 3.375 g via INTRAVENOUS
  Filled 2019-07-13: qty 50

## 2019-07-13 MED ORDER — PANTOPRAZOLE SODIUM 40 MG IV SOLR
40.0000 mg | Freq: Once | INTRAVENOUS | Status: AC
  Administered 2019-07-13: 09:00:00 40 mg via INTRAVENOUS
  Filled 2019-07-13: qty 40

## 2019-07-13 MED ORDER — LIDOCAINE HCL (PF) 1 % IJ SOLN
INTRAMUSCULAR | Status: DC | PRN
  Administered 2019-07-13: 5 mL

## 2019-07-13 MED ORDER — IOHEXOL 300 MG/ML  SOLN
100.0000 mL | Freq: Once | INTRAMUSCULAR | Status: AC | PRN
  Administered 2019-07-13: 11:00:00 40 mL via INTRA_ARTERIAL

## 2019-07-13 MED ORDER — DEXAMETHASONE SODIUM PHOSPHATE 10 MG/ML IJ SOLN: 8.0000 mg | Freq: Once | INTRAMUSCULAR | Status: DC

## 2019-07-13 MED ORDER — SODIUM CHLORIDE 0.9 % IV SOLN
INTRAVENOUS | Status: DC
  Administered 2019-07-13: 08:00:00 via INTRAVENOUS

## 2019-07-13 MED ORDER — IOHEXOL 300 MG/ML  SOLN
100.0000 mL | Freq: Once | INTRAMUSCULAR | Status: AC | PRN
  Administered 2019-07-13: 11:00:00 100 mL via INTRA_ARTERIAL

## 2019-07-13 NOTE — Sedation Documentation (Signed)
Transferred to short stay  

## 2019-07-13 NOTE — Discharge Instructions (Signed)
Angiogram, Care After °This sheet gives you information about how to care for yourself after your procedure. Your doctor may also give you more specific instructions. If you have problems or questions, contact your doctor. °Follow these instructions at home: °Insertion site care °· Follow instructions from your doctor about how to take care of your long, thin tube (catheter) insertion area. Make sure you: °? Wash your hands with soap and water before you change your bandage (dressing). If you cannot use soap and water, use hand sanitizer. °? Change your bandage as told by your doctor. °? Leave stitches (sutures), skin glue, or skin tape (adhesive) strips in place. They may need to stay in place for 2 weeks or longer. If tape strips get loose and curl up, you may trim the loose edges. Do not remove tape strips completely unless your doctor says it is okay. °· Do not take baths, swim, or use a hot tub until your doctor says it is okay. °· You may shower 24-48 hours after the procedure or as told by your doctor. °? Gently wash the area with plain soap and water. °? Pat the area dry with a clean towel. °? Do not rub the area. This may cause bleeding. °· Do not apply powder or lotion to the area. Keep the area clean and dry. °· Check your insertion area every day for signs of infection. Check for: °? More redness, swelling, or pain. °? Fluid or blood. °? Warmth. °? Pus or a bad smell. °Activity °· Rest as told by your doctor, usually for 1-2 days. °· Do not lift anything that is heavier than 10 lbs. (4.5 kg) or as told by your doctor. °· Do not drive for 24 hours if you were given a medicine to help you relax (sedative). °· Do not drive or use heavy machinery while taking prescription pain medicine. °General instructions ° °· Go back to your normal activities as told by your doctor, usually in about a week. Ask your doctor what activities are safe for you. °· If the insertion area starts to bleed, lie flat and put  pressure on the area. If the bleeding does not stop, get help right away. This is an emergency. °· Drink enough fluid to keep your pee (urine) clear or pale yellow. °· Take over-the-counter and prescription medicines only as told by your doctor. °· Keep all follow-up visits as told by your doctor. This is important. °Contact a doctor if: °· You have a fever. °· You have chills. °· You have more redness, swelling, or pain around your insertion area. °· You have fluid or blood coming from your insertion area. °· The insertion area feels warm to the touch. °· You have pus or a bad smell coming from your insertion area. °· You have more bruising around the insertion area. °· Blood collects in the tissue around the insertion area (hematoma) that may be painful to the touch. °Get help right away if: °· You have a lot of pain in the insertion area. °· The insertion area swells very fast. °· The insertion area is bleeding, and the bleeding does not stop after holding steady pressure on the area. °· The area near or just beyond the insertion area becomes pale, cool, tingly, or numb. °These symptoms may be an emergency. Do not wait to see if the symptoms will go away. Get medical help right away. Call your local emergency services (911 in the [place] Do not drive yourself to the hospital. °  Summary °· After the procedure, it is common to have bruising and tenderness at the long, thin tube insertion area. °· After the procedure, it is important to rest and drink plenty of fluids. °· Do not take baths, swim, or use a hot tub until your doctor says it is okay to do so. You may shower 24-48 hours after the procedure or as told by your doctor. °· If the insertion area starts to bleed, lie flat and put pressure on the area. If the bleeding does not stop, get help right away. This is an emergency. °This information is not intended to replace advice given to you by your health care provider. Make sure you discuss any questions you have  with your health care provider. °Document Released: 12/20/2008 Document Revised: 09/05/2017 Document Reviewed: 09/17/2016 °Elsevier Patient Education © 2020 [employer] °Moderate Conscious Sedation, Adult, Care After °These instructions provide you with information about caring for yourself after your procedure. Your health care provider may also give you more specific instructions. Your treatment has been planned according to current medical practices, but problems sometimes occur. Call your health care provider if you have any problems or questions after your procedure. °What can I expect after the procedure? °After your procedure, it is common: °· To feel sleepy for several hours. °· To feel clumsy and have poor balance for several hours. °· To have poor judgment for several hours. °· To vomit if you eat too soon. °Follow these instructions at home: °For at least 24 hours after the procedure: ° °· Do not: °? Participate in activities where you could fall or become injured. °? Drive. °? Use heavy machinery. °? Drink alcohol. °? Take sleeping pills or medicines that cause drowsiness. °? Make important decisions or sign legal documents. °? Take care of children on your own. °· Rest. °Eating and drinking °· Follow the diet recommended by your health care provider. °· If you vomit: °? Drink water, juice, or soup when you can drink without vomiting. °? Make sure you have little or no nausea before eating solid foods. °General instructions °· Have a responsible adult stay with you until you are awake and alert. °· Take over-the-counter and prescription medicines only as told by your health care provider. °· If you smoke, do not smoke without supervision. °· Keep all follow-up visits as told by your health care provider. This is important. °Contact a health care provider if: °· You keep feeling nauseous or you keep vomiting. °· You feel light-headed. °· You develop a rash. °· You have a fever. °Get help right away  if: °· You have trouble breathing. °This information is not intended to replace advice given to you by your health care provider. Make sure you discuss any questions you have with your health care provider. °Document Released: 07/14/2013 Document Revised: 09/05/2017 Document Reviewed: 01/13/2016 °Elsevier Patient Education © 2020 [employer] ° °

## 2019-07-13 NOTE — Procedures (Signed)
Pre-procedure Diagnosis: Metastatic bladder cancer Post-procedure Diagnosis: Same  Post Y-90 mapping radioembolization.    Complications: None Immediate EBL: None  Keep right leg straight for 4 hrs (until 1500)  Signed: Sandi Mariscal Pager: [phone] 07/13/2019, 11:59 AM

## 2019-07-13 NOTE — H&P (Signed)
Chief Complaint: Patient was seen in consultation today for pre Y-90 arteriogram at the request of Dr. Grayland Ormond  Referring Physician(s): Dr. Grayland Ormond  Supervising Physician: Sandi Mariscal  Patient Status: [facility] - Out-pt  History of Present Illness: [name] is a 73 y.o. 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 He was referred to Dr. Pascal Lux for  Consideration of Y-90 radioembolization to the lvier. After thorough consult and discussion he is agreeable to the procedure and is scheduled for pre Y-90 arteriogram and test dosing today. PMHx, meds, labs, imaging, allergies reviewed. Feels well, no recent fevers, chills, illness. Has been NPO today as directed.    Past Medical History:  Diagnosis Date  . Cancer [facility]) 2013   Right Renal   . Hyperlipidemia   . Hypertension   . Renal disorder   . 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 CORONARY STENT INTERVENTION N/A 09/28/2018   Procedure: CORONARY STENT INTERVENTION;  Surgeon: [name], MD;  Location: [place] [facility];  Service: Cardiovascular;  Laterality: N/A;  . CORONARY STENT PLACEMENT    . IR FLUORO GUIDE CV LINE RIGHT  04/03/2018  . IR RADIOLOGIST EVAL & MGMT  06/01/2019  . IR RADIOLOGIST EVAL & MGMT  06/09/2019  . kidney removed Left 2013  . LEFT HEART CATH AND CORONARY ANGIOGRAPHY N/A 09/28/2018   Procedure: LEFT HEART CATH AND CORONARY ANGIOGRAPHY;  Surgeon: [name], MD;  Location: [place] [facility];  Service: Cardiovascular;  Laterality: N/A;    Allergies: Clopidogrel, Rosuvastatin calcium, Etodolac, Hydrocodone, Other, Statins, Meloxicam, and Pravastatin sodium  Medications: Prior to Admission medications   Medication Sig Start Date End Date Taking? Authorizing Provider  carvedilol (COREG) 6.25 MG tablet Take 1 tablet (6.25 mg total) by mouth 2 (two) times daily. 03/12/19  Yes  [name], MD  docusate sodium (COLACE) 100 MG capsule Take 100 mg by mouth 2 (two) times daily as needed for mild constipation. 2 in am, 3 in pm   Yes [provider]  ezetimibe (ZETIA) 10 MG tablet TAKE 1 TABLET BY MOUTH DAILY 03/24/19  Yes [name], MD  HYDROcodone-acetaminophen (NORCO/VICODIN) 5-325 MG tablet Take 1 tablet by mouth every 6 (six) hours as needed for moderate pain. 04/29/19  Yes [name], MD  metoCLOPramide (REGLAN) 10 MG tablet Take 1 tablet (10 mg total) by mouth 4 (four) times daily. 05/20/19  Yes [name], MD  Multiple Vitamin (MULTI-VITAMINS) TABS Take by mouth.   Yes [provider]  ondansetron (ZOFRAN) 8 MG tablet Take 1 tablet (8 mg total) by mouth every 8 (eight) hours as needed for nausea or vomiting. 04/06/19  Yes Finnegan, [name], MD  polyethylene glycol (MIRALAX / GLYCOLAX) 17 g packet Take 17 g by mouth 2 (two) times daily.    Yes [provider]  prochlorperazine (COMPAZINE) 10 MG tablet Take 1 tablet (10 mg total) by mouth every 6 (six) hours as needed for nausea or vomiting. 04/29/19  Yes [name], MD  traZODone (DESYREL) 50 MG tablet Take 1-2 tablets (50-100 mg total) by mouth at bedtime. 07/12/19  Yes Jacquelin Hawking, NP  acetaminophen (TYLENOL) 325 MG tablet Take 325 mg by mouth every 4 (four) hours as needed.  02/18/18   [provider]  ALPRAZolam Duanne Moron) 0.25 MG tablet  05/18/19   [provider]  amLODipine (  NORVASC) 10 MG tablet TAKE 1 TABLET BY MOUTH EVERY DAY 02/01/19   [provider]  aspirin EC 81 MG tablet Take by mouth.    [provider]  clopidogrel (PLAVIX) 75 MG tablet Take 75 mg by mouth daily. 11/02/18 11/02/19  [provider]  famotidine (PEPCID) 20 MG tablet Take 20 mg by mouth 2 (two) times daily.  03/03/19 03/02/20  [provider]  fentaNYL (DURAGESIC) 25 MCG/HR Place 1 patch onto the skin every 3 (three) days.  Patient not taking: Reported on 07/09/2019 04/22/19   [name], MD  Garlic 123XX123 MG TABS Take by mouth.     [provider]  linaclotide Rolan Lipa) 72 MCG capsule Take 1 capsule (72 mcg total) by mouth daily before breakfast. Patient not taking: Reported on 07/12/2019 07/09/19   Jacquelin Hawking, NP  magnesium hydroxide (MILK OF MAGNESIA) 400 MG/5ML suspension Take 30 mLs by mouth.    [provider]  megestrol (MEGACE) 20 MG tablet Take 1 tablet (20 mg total) by mouth daily. 05/06/19   [name], MD  nitroGLYCERIN (NITROSTAT) 0.4 MG SL tablet Place under the tongue. 10/05/18 10/05/19  [provider]  Omega-3 Fatty Acids (FISH OIL OMEGA-3) 1000 MG CAPS Take by mouth daily.    [provider]  sucralfate (CARAFATE) 1 g tablet Take 1 tablet (1 g total) by mouth 3 (three) times daily. 04/14/19   Noreene Filbert, MD     Family History  Problem Relation Age of Onset  . Coronary artery disease Mother   . Anemia Mother   . Kidney failure Mother   . Subarachnoid hemorrhage Father   . Breast cancer Sister 95  . Hypertension Brother     Social History   Socioeconomic History  . Marital status: Divorced    Spouse name: Not on file  . Number of children: Not on file  . Years of education: Not on file  . Highest education level: Not on file  Occupational History  . Not on file  Social Needs  . Financial resource strain: Not on file  . Food insecurity    Worry: Not on file    Inability: Not on file  . Transportation needs    Medical: Not on file    Non-medical: Not on file  Tobacco Use  . Smoking status: Former Smoker    Packs/day: 1.00    Years: 55.00    Pack years: 55.00    Types: Cigarettes    Quit date: 03/20/2012    Years since quitting: 7.3  . Smokeless tobacco: Never Used  Substance and Sexual Activity  . Alcohol use: Yes    Comment: Social  . Drug use: No  . Sexual activity: Not on file  Lifestyle  . Physical activity     Days per week: Not on file    Minutes per session: Not on file  . Stress: Not on file  Relationships  . Social Herbalist on phone: Not on file    Gets together: Not on file    Attends religious service: Not on file    Active member of club or organization: Not on file    Attends meetings of clubs or organizations: Not on file    Relationship status: Not on file  Other Topics Concern  . Not on file  Social History Narrative  . Not on file     Review of Systems: A 12 point ROS discussed and pertinent  positives are indicated in the HPI above.  All other systems are negative.  Review of Systems  Vital Signs: BP 134/81 (BP Location: Right Arm)   Pulse 92   Temp 98.2 F (36.8 C) (Oral)   Resp 18   Ht 6' (1.829 m)   Wt 81.6 kg   SpO2 99%   BMI 24.41 kg/m   Physical Exam Constitutional:      Appearance: Normal appearance.  HENT:     Head: Normocephalic.     Mouth/Throat:     Mouth: Mucous membranes are moist.     Pharynx: Oropharynx is clear.  Cardiovascular:     Rate and Rhythm: Normal rate and regular rhythm.     Pulses: Normal pulses.     Heart sounds: Normal heart sounds.  Pulmonary:     Effort: Pulmonary effort is normal. No respiratory distress.     Breath sounds: Normal breath sounds.  Skin:    General: Skin is warm and dry.  Neurological:     General: No focal deficit present.     Mental Status: He is alert and oriented to person, place, and time.  Psychiatric:        Mood and Affect: Mood normal.        Judgment: Judgment normal.     Imaging: Nm Pet Image Restag (ps) Skull Base To Thigh  Result Date: 06/17/2019 CLINICAL DATA:  Subsequent treatment strategy for uroepithelial carcinoma of the bladder. EXAM: NUCLEAR MEDICINE PET SKULL BASE TO THIGH TECHNIQUE: 10.5 mCi F-18 FDG was injected intravenously. Full-ring PET imaging was performed from the skull base to thigh after the radiotracer. CT data was obtained and used for attenuation  correction and anatomic localization. Fasting blood glucose: 90 mg/dl COMPARISON:  Abdominal MRI 06/04/2019. Abdomen/pelvis CT 05/24/2019. PET-CT 06/29/2018. FINDINGS: Mediastinal blood pool activity: SUV max  2.1 Liver activity: SUV max NA NECK: No hypermetabolic lymph nodes in the neck. Incidental CT findings: none CHEST: Hypermetabolic lymphadenopathy is seen in the left supraclavicular region. Index 9 mm short axis node on image 63/series 3 demonstrates SUV max = 9.0 hypermetabolic left supraclavicular node on 73/3 demonstrates SUV max = 12.1. No evidence for hypermetabolic mediastinal or hilar lymphadenopathy. No hypermetabolic axillary adenopathy. Hypermetabolic pulmonary nodules are identified in the lungs bilaterally. Index 8 mm anterior right upper lobe nodule (118/3) demonstrates SUV max = 5.9. 14 mm irregular nodule posterior right lower lobe on 01/30 6/3 has SUV max = 6.0. Incidental CT findings: Coronary artery calcification is evident. Atherosclerotic calcification is noted in the wall of the thoracic aorta. Right Port-A-Cath tip is positioned at the SVC/RA junction. Centrilobular and paraseptal emphysema evident. ABDOMEN/PELVIS: Bulky hypermetabolic liver metastases are evident. A dominant lesion in the dome of liver seen on recent MRI at 4.2 cm demonstrates SUV max = 10.2. Posterior right hepatic lesion measuring 2.5 cm on image 155/3 has SUV max = 10.2. 2.7 cm lesion inferior right liver on 192/3 demonstrates SUV max = 9.8. Hypermetabolic lymphadenopathy noted in the right retrocrural space. Retroperitoneal lymphadenopathy is hypermetabolic. Index 17 mm left para-aortic node on 191/3 demonstrates SUV max = 17.4. Hypermetabolic retroperitoneal lymphadenopathy extends into the pelvis with hypermetabolic lymph nodes identified in the right pelvic sidewall. Index 15 mm short axis right pelvic sidewall node on 248/3 has SUV max = 17.8. Incidental CT findings: Tiny calcified gallstone evident. Right  kidney surgically absent. Diffuse colonic diverticulosis without diverticulitis. Penile prosthetic device evident. There is abdominal aortic atherosclerosis without aneurysm. SKELETON: Hypermetabolic lesion noted inferior  right scapula with SUV max = 3.8. Mixed lytic and sclerotic lesion in the L2 vertebral body is hypermetabolic with SUV max = 6.6 metastatic lesion noted L3 level. Incidental CT findings: Mild degenerative changes noted in the hips. IMPRESSION: 1. Hypermetabolic nodal metastases identified in the left supraclavicular space, right retrocrural space, abdominal retroperitoneal space, and extraperitoneal nodal chains of the posterior pelvis and right pelvic sidewall. 2. Hypermetabolic pulmonary nodules consistent with metastatic disease. 3. Bulky hypermetabolic liver metastases. 4. Hypermetabolic bone lesions including the inferior right scapula and L2 vertebral body consistent with metastatic involvement. 5. Cholelithiasis. 6.  Aortic Atherosclerois (ICD10-170.0) Electronically Signed   By: Misty Stanley M.D.   On: 06/17/2019 15:49    Labs:  CBC: Recent Labs    06/03/19 0848 06/22/19 0928 07/12/19 0934 07/13/19 0803  WBC 7.2 8.8 9.6 9.1  HGB 11.1* 10.9* 11.5* 11.6*  HCT 34.2* 34.9* 36.3* 37.4*  PLT 237 227 168 191    COAGS: Recent Labs    09/27/18 0047 07/13/19 0803  INR 1.02 1.0  APTT >160*  --     BMP: Recent Labs    06/03/19 0848 06/22/19 0928 07/12/19 0934 07/13/19 0803  NA 137 137 135 134*  K 3.7 3.9 3.9 4.2  CL 107 106 103 102  CO2 23 22 22 22   GLUCOSE 135* 118* 119* 94  BUN 18 14 18 18   CALCIUM 8.6* 8.4* 8.7* 8.8*  CREATININE 1.07 0.94 1.03 0.95  GFRNONAA >60 >60 >60 >60  GFRAA >60 >60 >60 >60    LIVER FUNCTION TESTS: Recent Labs    06/03/19 0848 06/22/19 0928 07/12/19 0934 07/13/19 0803  BILITOT 0.4 0.5 0.5 0.3  AST 18 21 35 41  ALT 19 19 40 48*  ALKPHOS 90 101 142* 170*  PROT 7.5 7.2 7.2 6.9  ALBUMIN 4.0 3.7 3.4* 3.4*    TUMOR  MARKERS: No results for input(s): AFPTM, CEA, CA199, CHROMGRNA in the last 8760 hours.  Assessment and Plan: Metastatic urothelial cancer to the liver Plan for pre Y-90 mesenteric arteriogram with test doing and possible branch embolization today. Anticipate staged (R)Y-90 treatment followed by (L)treatment in the coming weeks. Labs ok Risks and benefits of heaptic angio were discussed with the patient including, but not limited to bleeding, infection, vascular injury or contrast induced renal failure.  This interventional procedure involves the use of X-rays and because of the nature of the planned procedure, it is possible that we will have prolonged use of X-ray fluoroscopy.  Potential radiation risks to you include (but are not limited to) the following: - A slightly elevated risk for cancer  several years later in life. This risk is typically less than 0.5% percent. This risk is low in comparison to the normal incidence of human cancer, which is 33% for women and 50% for men according to the [place]. - Radiation induced injury can include skin redness, resembling a rash, tissue breakdown / ulcers and hair loss (which can be temporary or permanent).   The likelihood of either of these occurring depends on the difficulty of the procedure and whether you are sensitive to radiation due to previous procedures, disease, or genetic conditions.   IF your procedure requires a prolonged use of radiation, you will be notified and given written instructions for further action.  It is your responsibility to monitor the irradiated area for the 2 weeks following the procedure and to notify your physician if you are concerned that you have suffered a radiation  induced injury.    All of the patient's questions were answered, patient is agreeable to proceed.  Consent signed and in chart.   Thank you for this interesting consult.  I greatly enjoyed meeting David Rich and look  forward to participating in their care.  A copy of this report was sent to the requesting provider on this date.  Electronically Signed: Ascencion Dike, PA-C 07/13/2019, 9:20 AM   I spent a total of 25 minutes in face to face in clinical consultation, greater than 50% of which was counseling/coordinating care for mesenteric angio/Pre Y-90

## 2019-07-13 NOTE — Sedation Documentation (Signed)
Transferred to nuc med for study 

## 2019-07-14 LAB — AFP TUMOR MARKER: AFP, Serum, Tumor Marker: 1 ng/mL (ref 0.0–8.3)

## 2019-07-14 LAB — CEA: CEA: 3.7 ng/mL (ref 0.0–4.7)

## 2019-07-16 ENCOUNTER — Other Ambulatory Visit: Payer: Self-pay | Admitting: Nurse Practitioner

## 2019-07-16 ENCOUNTER — Telehealth: Payer: Self-pay | Admitting: *Deleted

## 2019-07-16 MED ORDER — FENTANYL 12 MCG/HR TD PT72: 1.0000 | MEDICATED_PATCH | TRANSDERMAL | 0 refills | Status: DC

## 2019-07-16 NOTE — Telephone Encounter (Signed)
sent 

## 2019-07-16 NOTE — Telephone Encounter (Signed)
Patient called and is asking that a new prescription for a lower dose of Fentanyl be sent in for him as per discussion with Dr Grayland Ormond

## 2019-07-21 ENCOUNTER — Telehealth: Payer: Self-pay | Admitting: *Deleted

## 2019-07-21 NOTE — Telephone Encounter (Signed)
I spoke with Caryl Pina and she will ut on 2 of the 12 mcg patches and call early next week for refill of the Fentanyl 25 mcg Fentanyl patches

## 2019-07-21 NOTE — Telephone Encounter (Signed)
That's fine

## 2019-07-21 NOTE — Telephone Encounter (Signed)
Caryl Pina called and states the patient would like to go back on the Fentanyl 25 mcg. Please advise

## 2019-07-26 ENCOUNTER — Other Ambulatory Visit: Payer: Self-pay | Admitting: Student

## 2019-07-26 ENCOUNTER — Other Ambulatory Visit: Payer: Self-pay | Admitting: Oncology

## 2019-07-26 DIAGNOSIS — C679 Malignant neoplasm of bladder, unspecified: Secondary | ICD-10-CM

## 2019-07-27 ENCOUNTER — Other Ambulatory Visit (HOSPITAL_COMMUNITY): Payer: Self-pay | Admitting: Interventional Radiology

## 2019-07-27 ENCOUNTER — Other Ambulatory Visit: Payer: Self-pay

## 2019-07-27 ENCOUNTER — Ambulatory Visit (HOSPITAL_COMMUNITY)
Admission: RE | Admit: 2019-07-27 | Discharge: 2019-07-27 | Disposition: A | Discharge status: Home / Self Care | Payer: Medicare Other | Source: Ambulatory Visit | Attending: Interventional Radiology | Admitting: Interventional Radiology

## 2019-07-27 ENCOUNTER — Other Ambulatory Visit: Payer: Self-pay | Admitting: *Deleted

## 2019-07-27 ENCOUNTER — Encounter (HOSPITAL_COMMUNITY): Payer: Self-pay

## 2019-07-27 DIAGNOSIS — C679 Malignant neoplasm of bladder, unspecified: Secondary | ICD-10-CM | POA: Insufficient documentation

## 2019-07-27 DIAGNOSIS — Z7982 Long term (current) use of aspirin: Secondary | ICD-10-CM | POA: Diagnosis not present

## 2019-07-27 DIAGNOSIS — I10 Essential (primary) hypertension: Secondary | ICD-10-CM | POA: Insufficient documentation

## 2019-07-27 DIAGNOSIS — C787 Secondary malignant neoplasm of liver and intrahepatic bile duct: Secondary | ICD-10-CM | POA: Insufficient documentation

## 2019-07-27 DIAGNOSIS — Z79899 Other long term (current) drug therapy: Secondary | ICD-10-CM | POA: Diagnosis not present

## 2019-07-27 DIAGNOSIS — Z87891 Personal history of nicotine dependence: Secondary | ICD-10-CM | POA: Insufficient documentation

## 2019-07-27 DIAGNOSIS — E785 Hyperlipidemia, unspecified: Secondary | ICD-10-CM | POA: Diagnosis not present

## 2019-07-27 HISTORY — PX: IR EMBO TUMOR ORGAN ISCHEMIA INFARCT INC GUIDE ROADMAPPING: IMG5449

## 2019-07-27 HISTORY — PX: IR ANGIOGRAM SELECTIVE EACH ADDITIONAL VESSEL: IMG667

## 2019-07-27 HISTORY — PX: IR ANGIOGRAM VISCERAL SELECTIVE: IMG657

## 2019-07-27 HISTORY — PX: IR US GUIDE VASC ACCESS RIGHT: IMG2390

## 2019-07-27 LAB — COMPREHENSIVE METABOLIC PANEL
ALT: 63 U/L — ABNORMAL HIGH (ref 0–44)
AST: 56 U/L — ABNORMAL HIGH (ref 15–41)
Albumin: 2.8 g/dL — ABNORMAL LOW (ref 3.5–5.0)
Alkaline Phosphatase: 249 U/L — ABNORMAL HIGH (ref 38–126)
Anion gap: 8 (ref 5–15)
BUN: 20 mg/dL (ref 8–23)
CO2: 20 mmol/L — ABNORMAL LOW (ref 22–32)
Calcium: 9.5 mg/dL (ref 8.9–10.3)
Chloride: 105 mmol/L (ref 98–111)
Creatinine, Ser: 0.9 mg/dL (ref 0.61–1.24)
GFR calc Af Amer: >60 mL/min (ref >60)
GFR calc non Af Amer: >60 mL/min (ref >60)
Glucose, Bld: 105 mg/dL — ABNORMAL HIGH (ref 70–99)
Potassium: 4.5 mmol/L (ref 3.5–5.1)
Sodium: 133 mmol/L — ABNORMAL LOW (ref 135–145)
Total Bilirubin: 0.7 mg/dL (ref 0.3–1.2)
Total Protein: 6.1 g/dL — ABNORMAL LOW (ref 6.5–8.1)

## 2019-07-27 LAB — CBC WITH DIFFERENTIAL/PLATELET
Abs Immature Granulocytes: 0.05 10*3/uL (ref 0.00–0.07)
Basophils Absolute: 0 10*3/uL (ref 0.0–0.1)
Basophils Relative: 0 %
Eosinophils Absolute: 0.1 10*3/uL (ref 0.0–0.5)
Eosinophils Relative: 1 %
HCT: 35.1 % — ABNORMAL LOW (ref 39.0–52.0)
Hemoglobin: 10.9 g/dL — ABNORMAL LOW (ref 13.0–17.0)
Immature Granulocytes: 1 %
Lymphocytes Relative: 8 %
Lymphs Abs: 0.8 10*3/uL (ref 0.7–4.0)
MCH: 30.9 pg (ref 26.0–34.0)
MCHC: 31.1 g/dL (ref 30.0–36.0)
MCV: 99.4 fL (ref 80.0–100.0)
Monocytes Absolute: 1 10*3/uL (ref 0.1–1.0)
Monocytes Relative: 10 %
Neutro Abs: 7.3 10*3/uL (ref 1.7–7.7)
Neutrophils Relative %: 80 %
Platelets: 128 10*3/uL — ABNORMAL LOW (ref 150–400)
RBC: 3.53 MIL/uL — ABNORMAL LOW (ref 4.22–5.81)
RDW: 16.6 % — ABNORMAL HIGH (ref 11.5–15.5)
WBC: 9.2 10*3/uL (ref 4.0–10.5)
nRBC: 0 % (ref 0.0–0.2)

## 2019-07-27 LAB — PROTIME-INR
INR: 1.1 (ref 0.8–1.2)
Prothrombin Time: 14 seconds (ref 11.4–15.2)

## 2019-07-27 MED ORDER — YTTRIUM 90 INJECTION
28.8000 | INJECTION | Freq: Once | INTRAVENOUS | Status: AC
  Administered 2019-07-27: 28.8 via INTRAVENOUS

## 2019-07-27 MED ORDER — HEPARIN SOD (PORK) LOCK FLUSH 100 UNIT/ML IV SOLN
500.0000 [IU] | Freq: Once | INTRAVENOUS | Status: AC
  Administered 2019-07-27: 500 [IU] via INTRAVENOUS
  Filled 2019-07-27: qty 5

## 2019-07-27 MED ORDER — FENTANYL 25 MCG/HR TD PT72: 1.0000 | MEDICATED_PATCH | TRANSDERMAL | 0 refills | Status: AC

## 2019-07-27 MED ORDER — SODIUM CHLORIDE 0.9 % IV SOLN
8.0000 mg | Freq: Once | INTRAVENOUS | Status: AC
  Administered 2019-07-27: 8 mg via INTRAVENOUS
  Filled 2019-07-27: qty 4

## 2019-07-27 MED ORDER — MIDAZOLAM HCL 2 MG/2ML IJ SOLN
INTRAMUSCULAR | Status: AC | PRN
  Administered 2019-07-27: 0.5 mg via INTRAVENOUS
  Administered 2019-07-27 (×2): 1 mg via INTRAVENOUS
  Administered 2019-07-27: 0.5 mg via INTRAVENOUS

## 2019-07-27 MED ORDER — FENTANYL CITRATE (PF) 100 MCG/2ML IJ SOLN
INTRAMUSCULAR | Status: AC | PRN
  Administered 2019-07-27 (×4): 50 ug via INTRAVENOUS

## 2019-07-27 MED ORDER — PIPERACILLIN-TAZOBACTAM 3.375 G IVPB
INTRAVENOUS | Status: AC
  Administered 2019-07-27: 3.375 g via INTRAVENOUS
  Filled 2019-07-27: qty 50

## 2019-07-27 MED ORDER — IOHEXOL 300 MG/ML  SOLN
100.0000 mL | Freq: Once | INTRAMUSCULAR | Status: AC | PRN
  Administered 2019-07-27: 50 mL via INTRA_ARTERIAL

## 2019-07-27 MED ORDER — HYDROCODONE-ACETAMINOPHEN 5-325 MG PO TABS: 1.0000 | ORAL_TABLET | Freq: Four times a day (QID) | ORAL | 0 refills | Status: AC | PRN

## 2019-07-27 MED ORDER — PIPERACILLIN-TAZOBACTAM 3.375 G IVPB 30 MIN
3.3750 g | Freq: Once | INTRAVENOUS | Status: AC
  Administered 2019-07-27: 10:00:00 3.375 g via INTRAVENOUS
  Filled 2019-07-27: qty 50

## 2019-07-27 MED ORDER — SODIUM CHLORIDE 0.9 % IV SOLN
INTRAVENOUS | Status: DC
  Administered 2019-07-27: 09:00:00 via INTRAVENOUS

## 2019-07-27 MED ORDER — FENTANYL CITRATE (PF) 100 MCG/2ML IJ SOLN
INTRAMUSCULAR | Status: AC
  Filled 2019-07-27: qty 4

## 2019-07-27 MED ORDER — LIDOCAINE HCL 1 % IJ SOLN
INTRAMUSCULAR | Status: AC
  Filled 2019-07-27: qty 20

## 2019-07-27 MED ORDER — DEXAMETHASONE SODIUM PHOSPHATE 10 MG/ML IJ SOLN
8.0000 mg | Freq: Once | INTRAMUSCULAR | Status: AC
  Administered 2019-07-27: 8 mg via INTRAVENOUS
  Filled 2019-07-27: qty 1

## 2019-07-27 MED ORDER — MIDAZOLAM HCL 2 MG/2ML IJ SOLN
INTRAMUSCULAR | Status: AC
  Filled 2019-07-27: qty 6

## 2019-07-27 MED ORDER — PANTOPRAZOLE SODIUM 40 MG IV SOLR
40.0000 mg | Freq: Once | INTRAVENOUS | Status: AC
  Administered 2019-07-27: 10:00:00 40 mg via INTRAVENOUS
  Filled 2019-07-27: qty 40

## 2019-07-27 MED ORDER — IOHEXOL 300 MG/ML  SOLN
100.0000 mL | Freq: Once | INTRAMUSCULAR | Status: AC | PRN
  Administered 2019-07-27: 30 mL via INTRA_ARTERIAL

## 2019-07-27 MED ORDER — LIDOCAINE HCL (PF) 1 % IJ SOLN
INTRAMUSCULAR | Status: AC | PRN
  Administered 2019-07-27: 5 mL

## 2019-07-27 NOTE — Procedures (Signed)
Pre-procedure Diagnosis: Metastatic uretherthral cancer.  Post-procedure Diagnosis: Same  Post Y-90 radioembolization of the right lobe of the liver.    Complications: None Immediate  EBL: None  Keep right leg straight for 4 hrs (until 1500).    Signed: Sandi Mariscal [phone] 07/27/2019, 11:57 AM

## 2019-07-27 NOTE — H&P (Signed)
Chief Complaint: Patient was seen in consultation today for Y-90 radioembolization treatment at the request of Dr. Grayland Ormond  Referring Physician(s): Dr. Grayland Ormond  Supervising Physician: Sandi Mariscal  Patient Status: [facility] - Out-pt  History of Present Illness: David Rich is a 73 y.o. 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 He was referred to Dr. Pascal Lux for consideration of Y-90 radioembolization to the liver. He underwent pre Y-90 arteriogram with coil embo of the GDA and subsequent test dosing NM tracer. He is now scheduled today for Y-90 treatment. PMHx, meds, labs, imaging, allergies reviewed. Feels well, no recent fevers, chills, illness. Just hasn't had much energy recently. Has been NPO today as directed.    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    Past Surgical History:  Procedure Laterality Date   CORONARY STENT INTERVENTION N/A 09/28/2018   Procedure: CORONARY STENT INTERVENTION;  Surgeon: [name], MD;  Location: [place] [facility];  Service: Cardiovascular;  Laterality: N/A;   CORONARY STENT PLACEMENT     IR ANGIOGRAM SELECTIVE EACH ADDITIONAL VESSEL  07/13/2019   IR ANGIOGRAM SELECTIVE EACH ADDITIONAL VESSEL  07/13/2019   IR ANGIOGRAM SELECTIVE EACH ADDITIONAL VESSEL  07/13/2019   IR ANGIOGRAM SELECTIVE EACH ADDITIONAL VESSEL  07/13/2019   IR ANGIOGRAM SELECTIVE EACH ADDITIONAL VESSEL  07/13/2019   IR ANGIOGRAM SELECTIVE EACH ADDITIONAL VESSEL  07/13/2019   IR ANGIOGRAM VISCERAL SELECTIVE  07/13/2019   IR EMBO ARTERIAL NOT HEMORR HEMANG INC GUIDE ROADMAPPING  07/13/2019   IR FLUORO GUIDE CV LINE RIGHT  04/03/2018   IR RADIOLOGIST EVAL & MGMT  06/01/2019   IR RADIOLOGIST EVAL & MGMT  06/09/2019   IR US GUIDE VASC ACCESS RIGHT  07/13/2019   kidney removed  Left 2013   LEFT HEART CATH AND CORONARY ANGIOGRAPHY N/A 09/28/2018   Procedure: LEFT HEART CATH AND CORONARY ANGIOGRAPHY;  Surgeon: [name], MD;  Location: [place] [facility];  Service: Cardiovascular;  Laterality: N/A;    Allergies: Clopidogrel, Rosuvastatin calcium, Etodolac, Hydrocodone, Other, Statins, Meloxicam, and Pravastatin sodium  Medications: Prior to Admission medications   Medication Sig Start Date End Date Taking? Authorizing Provider  carvedilol (COREG) 6.25 MG tablet Take 1 tablet (6.25 mg total) by mouth 2 (two) times daily. 03/12/19  Yes [name], MD  docusate sodium (COLACE) 100 MG capsule Take 100 mg by mouth 2 (two) times daily as needed for mild constipation. 2 in am, 3 in pm   Yes [provider]  ezetimibe (ZETIA) 10 MG tablet TAKE 1 TABLET BY MOUTH DAILY 03/24/19  Yes [name], MD  HYDROcodone-acetaminophen (NORCO/VICODIN) 5-325 MG tablet Take 1 tablet by mouth every 6 (six) hours as needed for moderate pain. 04/29/19  Yes [name], MD  metoCLOPramide (REGLAN) 10 MG tablet Take 1 tablet (10 mg total) by mouth 4 (four) times daily. 05/20/19  Yes [name], MD  Multiple Vitamin (MULTI-VITAMINS) TABS Take by mouth.   Yes [provider]  ondansetron (ZOFRAN) 8 MG tablet Take 1 tablet (8 mg total) by mouth every 8 (eight) hours as needed for nausea or vomiting. 04/06/19  Yes Finnegan, [name], MD  polyethylene glycol (MIRALAX / GLYCOLAX) 17 g packet Take 17 g by mouth 2 (two) times daily.  Yes [provider]  prochlorperazine (COMPAZINE) 10 MG tablet Take 1 tablet (10 mg total) by mouth every 6 (six) hours as needed for nausea or vomiting. 04/29/19  Yes [name], MD  traZODone (DESYREL) 50 MG tablet Take 1-2 tablets (50-100 mg total) by mouth at bedtime. 07/12/19  Yes Jacquelin Hawking, NP  acetaminophen (TYLENOL) 325 MG tablet Take 325 mg by mouth every 4 (four) hours as needed.  02/18/18    [provider]  ALPRAZolam Duanne Moron) 0.25 MG tablet  05/18/19   [provider]  amLODipine (NORVASC) 10 MG tablet TAKE 1 TABLET BY MOUTH EVERY DAY 02/01/19   [provider]  aspirin EC 81 MG tablet Take by mouth.    [provider]  clopidogrel (PLAVIX) 75 MG tablet Take 75 mg by mouth daily. 11/02/18 11/02/19  [provider]  famotidine (PEPCID) 20 MG tablet Take 20 mg by mouth 2 (two) times daily.  03/03/19 03/02/20  [provider]  fentaNYL (DURAGESIC) 25 MCG/HR Place 1 patch onto the skin every 3 (three) days. Patient not taking: Reported on 07/09/2019 04/22/19   [name], MD  Garlic 562 MG TABS Take by mouth.     [provider]  linaclotide Rolan Lipa) 72 MCG capsule Take 1 capsule (72 mcg total) by mouth daily before breakfast. Patient not taking: Reported on 07/12/2019 07/09/19   Jacquelin Hawking, NP  magnesium hydroxide (MILK OF MAGNESIA) 400 MG/5ML suspension Take 30 mLs by mouth.    [provider]  megestrol (MEGACE) 20 MG tablet Take 1 tablet (20 mg total) by mouth daily. 05/06/19   [name], MD  nitroGLYCERIN (NITROSTAT) 0.4 MG SL tablet Place under the tongue. 10/05/18 10/05/19  [provider]  Omega-3 Fatty Acids (FISH OIL OMEGA-3) 1000 MG CAPS Take by mouth daily.    [provider]  sucralfate (CARAFATE) 1 g tablet Take 1 tablet (1 g total) by mouth 3 (three) times daily. 04/14/19   Noreene Filbert, MD     Family History  Problem Relation Age of Onset   Coronary artery disease Mother    Anemia Mother    Kidney failure Mother    Subarachnoid hemorrhage Father    Breast cancer Sister 83   Hypertension Brother     Social History   Socioeconomic History   Marital status: Divorced    Spouse name: Not on file   Number of children: Not on file   Years of education: Not on file   Highest education level: Not on file  Occupational History   Not on file    Social Needs   Financial resource strain: Not on file   Food insecurity    Worry: Not on file    Inability: Not on file   Transportation needs    Medical: Not on file    Non-medical: Not on file  Tobacco Use   Smoking status: Former Smoker    Packs/day: 1.00    Years: 55.00    Pack years: 55.00    Types: Cigarettes    Quit date: 03/20/2012    Years since quitting: 7.3   Smokeless tobacco: Never Used  Substance and Sexual Activity   Alcohol use: Yes    Comment: Social   Drug use: No   Sexual activity: Not on file  Lifestyle   Physical activity    Days per week: Not on file    Minutes per session: Not on file   Stress: Not  on file  Relationships   Social connections    Talks on phone: Not on file    Gets together: Not on file    Attends religious service: Not on file    Active member of club or organization: Not on file    Attends meetings of clubs or organizations: Not on file    Relationship status: Not on file  Other Topics Concern   Not on file  Social History Narrative   Not on file     Review of Systems: A 12 point ROS discussed and pertinent positives are indicated in the HPI above.  All other systems are negative.  Review of Systems  Vital Signs: BP 122/84 (BP Location: Right Arm)    Pulse 98    Temp 98 F (36.7 C) (Oral)    Resp 18    SpO2 98%   Physical Exam Constitutional:      Appearance: Normal appearance.  HENT:     Head: Normocephalic.     Mouth/Throat:     Mouth: Mucous membranes are moist.     Pharynx: Oropharynx is clear.  Cardiovascular:     Rate and Rhythm: Normal rate and regular rhythm.     Pulses: Normal pulses.     Heart sounds: Normal heart sounds.  Pulmonary:     Effort: Pulmonary effort is normal. No respiratory distress.     Breath sounds: Normal breath sounds.  Skin:    General: Skin is warm and dry.  Neurological:     General: No focal deficit present.     Mental Status: He is alert and oriented to person,  place, and time.  Psychiatric:        Mood and Affect: Mood normal.        Judgment: Judgment normal.     Imaging: IR Embo Arterial Not [place]  Result Date: 07/13/2019 INDICATION: History of metastatic bladder cancer. Patient presents today for mapping/pre Y 90 radioembolization. EXAM: 1. ULTRASOUND GUIDANCE FOR ARTERIAL ACCESS 2. CELIAC AND SUPERIOR MESENTERIC ARTERIOGRAM (1st ORDER) 3. COMMON HEPATIC ARTERIOGRAM (3rd ORDER) 4. GASTRODUODENAL ARTERIOGRAM (3rd ORDER) AND PERCUTANEOUS COIL EMBOLIZATION 5. SELECTIVE LEFT HEPATIC ARTERIOGRAM (3rd ORDER) 6. SELECTIVE RIGHT GASTRIC ARTERIOGRAM (3rd ORDER) 7. SELECTIVE RIGHT HEPATIC ARTERIOGRAM (3rd ORDER) 8. PROPER HEPATIC ARTERIOGRAM (3rd ORDER) AND FLUOROSCOPIC GUIDED ADMINISTRATION OF Tc7mMAA COMPARISON:  CT abdomen pelvis-05/24/2019; PET-CT-06/17/2019 MEDICATIONS: Zosyn 3.375 g IV RADIOPHARMACEUTICALS:  5 mCi Tc958mAA administered via the proper hepatic artery CONTRAST:  140 cc Omnipaque 300 ANESTHESIA/SEDATION: Moderate (conscious) sedation was employed during this procedure. A total of Versed 3 mg and Fentanyl 100 mcg was administered intravenously. Moderate Sedation Time: 88 minutes. The patient's level of consciousness and vital signs were monitored continuously by radiology nursing throughout the procedure under my direct supervision. FLUOROSCOPY TIME:  26 minutes, 42 seconds (3,865 mGy) ACCESS: Right common femoral artery; hemostasis achieved with manual compression. COMPLICATIONS: None immediate. TECHNIQUE: Informed written consent was obtained from the patient after a discussion of the risks, benefits and alternatives to treatment. Questions regarding the procedure were encouraged and answered. A timeout was performed prior to the initiation of the procedure. The right groin was prepped and draped in the usual sterile fashion, and a sterile drape was applied covering the operative field. Maximum barrier sterile  technique with sterile gowns and gloves were used for the procedure. A timeout was performed prior to the initiation of the procedure. Local anesthesia was provided with 1% lidocaine. The right femoral head  was marked fluoroscopically. Under ultrasound guidance, the right common femoral artery was accessed with a micropuncture kit after the overlying soft tissues were anesthetized with 1% lidocaine. An ultrasound image was saved for documentation purposes. The micropuncture sheath was exchanged for a 5 [place] vascular sheath over a Bentson wire. A closure arteriogram was performed through the side of the sheath confirming access within the right common femoral artery. Over a Bentson wire, a Mickelson catheter was advanced to the level of the thoracic aorta where it was back bled and flushed. The catheter was then utilized to select the superior mesenteric artery and a superior mesenteric arteriogram with delayed portal venous imaging was performed. The Mickelson catheter was then advanced cranially and utilized to select the celiac artery and a celiac arteriogram was performed. Utilizing a fathom 14 microwire, a regular Renegade microcatheter was advanced into the common hepatic artery and a selective common hepatic arteriogram was performed. The microcatheter was then utilized to select the gastroduodenal artery and a selective gastroduodenal arteriogram was performed. Next, the gastroduodenal artery was percutaneously coil embolized to near its origin with multiple overlapping 4 mm and 5 mm soft and standard interlock coils. The microcatheter was retracted into the common hepatic artery and a post embolization common hepatic arteriogram was performed. Next, the microcatheter was utilized select both the right and left hepatic arteries and selective arteriograms was performed at both locations. During catheter manipulation, the previously not identified right gastric artery was inadvertently selected. Attempts were  made to embolize the origin of the vessel, however this proved unsuccessful secondary to lack of purchase of the microcatheter at the tiny vessel's origin and inability to cannulate the vessel further. As such, the microcatheter was advanced to the level of proper hepatic artery and a selective proper hepatic arteriogram was performed and 5 mCi of technetium 99 MAA was administered for this location. At this point, the procedure was terminated. All wires and catheters and sheaths were removed from the patient. Hemostasis was achieved at the right groin and access site with manual compression. A dressing was placed. The patient tolerated procedure well without immediate postprocedural complication. The patient was escorted to nuclear medicine department for planar imaging. FINDINGS: Selective superior mesenteric arteriogram is negative for definitive replaced or accessory hepatic arterial supply. Delayed portal venous imaging demonstrates patency of the main, right and left portal veins. Selective celiac arteriogram demonstrates non conventional anatomy with a separate accessory medial segmental branch of the right hepatic artery arising proximal to vessel's normal bifurcation. A right gastric artery was inadvertently cannulated and sub selectively injected, however despite prolonged efforts, the vessel could not be further cannulated to allow for successful percutaneous coil embolization. Selective right and left hepatic arteriogram demonstrate otherwise conventional branching pattern without definitive evidence of extrahepatic arterial supply. Successful administration of MAA from the level of the proper hepatic artery, IMPRESSION: 1. Successful pre Y-90 arteriogram with percutaneous coil embolization of the gastroduodenal artery. 2. Successful right gastric arteriogram, however the vessel could not be percutaneously coil embolized secondary to tortuosity and small caliber of the vessel's origin. 3. Successful  administration of 5 mCi of technetium 44mMAA via the proper hepatic artery. Awaiting results of nuclear medicine liver scan. PLAN: Pending the results of the nuclear medicine liver scan, the patient will return for Y 90 radioembolization of the right lobe of the liver. At the time of the planned right lobe radioembolization additional attempts will be made to coil the right gastric artery. Electronically Signed   By:  Sandi Mariscal M.D.   On: 07/13/2019 12:22   Nm Fusion  Result Date: 07/13/2019 CLINICAL DATA:  Bladder carcinoma with liver metastasis. Additional nodal metastasis and spine metastasis. Evaluation for yttrium 90 radiotherapy to the liver. EXAM: ULTRASOUND MISCELLANEOUS SOFT TISSUE; NUCLEAR MEDICINE LIVER SCAN TECHNIQUE: Abdominal images were obtained in multiple projections after intrahepatic arterial injection of radiopharmaceutical. SPECT imaging was performed. Lung shunt calculation was performed. RADIOPHARMACEUTICALS:  5.2 millicuries technetium 99 MAA COMPARISON:  PET-CT 06/17/2019, MRI abdomen 06/03/2019 FINDINGS: The injected microaggregated albumin localizes within the liver. No evidence of activity within the stomach, duodenum, or bowel. Calculated shunt fraction to the lungs equals 22%. IMPRESSION: 1. No significant extrahepatic radiotracer activity following intrahepatic arterial injection of MAA. 2. Significant lung shunt fraction equals 22% Electronically Signed   By: Suzy Bouchard M.D.   On: 07/13/2019 17:01    Labs:  CBC: Recent Labs    06/03/19 0848 06/22/19 0928 07/12/19 0934 07/13/19 0803  WBC 7.2 8.8 9.6 9.1  HGB 11.1* 10.9* 11.5* 11.6*  HCT 34.2* 34.9* 36.3* 37.4*  PLT 237 227 168 191    COAGS: Recent Labs    09/27/18 0047 07/13/19 0803  INR 1.02 1.0  APTT >160*  --     BMP: Recent Labs    06/03/19 0848 06/22/19 0928 07/12/19 0934 07/13/19 0803  NA 137 137 135 134*  K 3.7 3.9 3.9 4.2  CL 107 106 103 102  CO2 _0 GLUCOSE 135* 118*  119* 94  BUN _1 CALCIUM 8.6* 8.4* 8.7* 8.8*  CREATININE 1.07 0.94 1.03 0.95  GFRNONAA >60 >60 >60 >60  GFRAA >60 >60 >60 >60    LIVER FUNCTION TESTS: Recent Labs    06/03/19 0848 06/22/19 0928 07/12/19 0934 07/13/19 0803  BILITOT 0.4 0.5 0.5 0.3  AST 18 21 35 41  ALT 19 19 40 48*  ALKPHOS 90 101 142* 170*  PROT 7.5 7.2 7.2 6.9  ALBUMIN 4.0 3.7 3.4* 3.4*    TUMOR MARKERS: No results for input(s): AFPTM, CEA, CA199, CHROMGRNA in the last 8760 hours.  Assessment and Plan: Metastatic urothelial cancer to the liver Plan for (R)Y-90 treatment today with possible subsequent (L)treatment in the coming weeks. Labs pending Risks and benefits of heaptic angio and radioembolization were discussed with the patient including, but not limited to bleeding, infection, vascular injury or contrast induced renal failure.  This interventional procedure involves the use of X-rays and because of the nature of the planned procedure, it is possible that we will have prolonged use of X-ray fluoroscopy.  Potential radiation risks to you include (but are not limited to) the following: - A slightly elevated risk for cancer  several years later in life. This risk is typically less than 0.5% percent. This risk is low in comparison to the normal incidence of human cancer, which is 33% for women and 50% for men according to the [place]. - Radiation induced injury can include skin redness, resembling a rash, tissue breakdown / ulcers and hair loss (which can be temporary or permanent).   The likelihood of either of these occurring depends on the difficulty of the procedure and whether you are sensitive to radiation due to previous procedures, disease, or genetic conditions.   IF your procedure requires a prolonged use of radiation, you will be notified and given written instructions for further action.  It is your responsibility to monitor the irradiated area for the 2 weeks  following the procedure and to notify your physician if you are concerned that you have suffered a radiation induced injury.    All of the patient's questions were answered, patient is agreeable to proceed.  Consent signed and in chart.   Thank you for this interesting consult.  I greatly enjoyed meeting David Rich and look forward to participating in their care.  A copy of this report was sent to the requesting provider on this date.  Electronically Signed: Ascencion Dike, PA-C 07/27/2019, 8:31 AM   I spent a total of 25 minutes in face to face in clinical consultation, greater than 50% of which was counseling/coordinating care for mesenteric angio/Pre Y-90

## 2019-07-27 NOTE — Discharge Instructions (Signed)
Post Y-90 Radioembolization Discharge Instructions ° °You have been given a radioactive material during your procedure.  While it is safe for you to be discharged home from the hospital, you need to proceed directly home.   ° °Do not use public transportation, including air travel, lasting more than 2 hours for 1 week. ° °Avoid crowded public places for 1 week. ° °Adult visitors should try to avoid close contact with you for 1 week.   ° °Children and pregnant females should not visit or have close contact with you for 1 week. ° °Items that you touch are not radioactive. ° °Do not sleep in the same bed as your partner for 1 week, and a condom should be used for sexual activity during the first 24 hours. ° °Your blood may be radioactive and caution should be used if any bleeding occurs during the recovery period. ° °Body fluids may be radioactive for 24 hours.  Wash your hands after voiding.  Men should sit to urinate.  Dispose of any soiled materials (flush down toilet or place in trash at home) during the first day. ° °Drink 6 to 8 glasses of fluids per day for 5 days to hydrate yourself. ° °If you need to see a doctor during the first week, you must let them know that you were treated with yttrium-90 microspheres, and will be slightly radioactive.  They can call Interventional Radiology [phone] with any questions ° ° ° ° °.Moderate Conscious Sedation, Adult, Care After °These instructions provide you with information about caring for yourself after your procedure. Your health care provider may also give you more specific instructions. Your treatment has been planned according to current medical practices, but problems sometimes occur. Call your health care provider if you have any problems or questions after your procedure. °What can I expect after the procedure? °After your procedure, it is common: °· To feel sleepy for several hours. °· To feel clumsy and have poor balance for several hours. °· To have poor  judgment for several hours. °· To vomit if you eat too soon. °Follow these instructions at home: °For at least 24 hours after the procedure: ° °· Do not: °? Participate in activities where you could fall or become injured. °? Drive. °? Use heavy machinery. °? Drink alcohol. °? Take sleeping pills or medicines that cause drowsiness. °? Make important decisions or sign legal documents. °? Take care of children on your own. °· Rest. °Eating and drinking °· Follow the diet recommended by your health care provider. °· If you vomit: °? Drink water, juice, or soup when you can drink without vomiting. °? Make sure you have little or no nausea before eating solid foods. °General instructions °· Have a responsible adult stay with you until you are awake and alert. °· Take over-the-counter and prescription medicines only as told by your health care provider. °· If you smoke, do not smoke without supervision. °· Keep all follow-up visits as told by your health care provider. This is important. °Contact a health care provider if: °· You keep feeling nauseous or you keep vomiting. °· You feel light-headed. °· You develop a rash. °· You have a fever. °Get help right away if: °· You have trouble breathing. °This information is not intended to replace advice given to you by your health care provider. Make sure you discuss any questions you have with your health care provider. °Document Released: 07/14/2013 Document Revised: 09/05/2017 Document Reviewed: 01/13/2016 °Elsevier Patient Education © 2020 [employer]   Inc[employer] ° °

## 2019-07-28 ENCOUNTER — Other Ambulatory Visit: Payer: Self-pay | Admitting: *Deleted

## 2019-07-28 ENCOUNTER — Other Ambulatory Visit: Payer: Self-pay | Admitting: Interventional Radiology

## 2019-07-28 DIAGNOSIS — C787 Secondary malignant neoplasm of liver and intrahepatic bile duct: Secondary | ICD-10-CM

## 2019-07-28 DIAGNOSIS — C220 Liver cell carcinoma: Secondary | ICD-10-CM

## 2019-07-28 LAB — CEA: CEA: 3.9 ng/mL (ref 0.0–4.7)

## 2019-07-28 LAB — AFP TUMOR MARKER: AFP, Serum, Tumor Marker: 1.1 ng/mL (ref 0.0–8.3)

## 2019-07-30 ENCOUNTER — Other Ambulatory Visit: Payer: Self-pay

## 2019-07-30 ENCOUNTER — Encounter: Payer: Self-pay | Admitting: Oncology

## 2019-07-30 NOTE — Progress Notes (Signed)
Patient pre screened for office appointment, no questions or concerns today. 

## 2019-08-01 NOTE — Progress Notes (Signed)
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  INTERVAL HISTORY: Patient returns to clinic today for further evaluation and consideration of cycle 4 of palliative Keytruda.  His right flank pain has improved since receiving his Y 90 liver ablation several weeks ago.  Despite this, he has worsening weakness and fatigue.  He also has a poor appetite. He has no neurologic complaints.  He denies any recent fevers or illnesses.  He denies any chest pain, shortness of breath, cough, or hemoptysis.  He denies any constipation or diarrhea.  He has no urinary complaints.  Patient offers no further specific complaints today.  REVIEW OF SYSTEMS:   Review of Systems  Constitutional: Positive for malaise/fatigue. Negative for fever and weight loss.  Respiratory: Negative.  Negative for cough and shortness of breath.   Cardiovascular: Negative.  Negative for chest pain and leg swelling.  Gastrointestinal: Positive for nausea. Negative for abdominal pain, constipation, diarrhea and vomiting.  Genitourinary: Negative.  Negative for dysuria and hematuria.  Musculoskeletal: Negative.  Negative for back pain.  Skin: Negative.  Negative for rash.  Neurological: Positive for weakness. Negative for sensory change, focal weakness and headaches.  Psychiatric/Behavioral: Negative.  The patient is not nervous/anxious and does not have insomnia.     As per HPI. Otherwise, a complete review of systems is negative.  PAST MEDICAL HISTORY: Past Medical History:  Diagnosis Date   Cancer [facility]) 2013   Right Renal     Hyperlipidemia    Hypertension    Renal disorder    Urothelial carcinoma of bladder ([place]) 2020    PAST SURGICAL HISTORY: Past Surgical History:  Procedure Laterality Date   CORONARY STENT INTERVENTION N/A 09/28/2018   Procedure: CORONARY STENT INTERVENTION;  Surgeon: [name], MD;  Location: [facility];  Service: Cardiovascular;  Laterality: N/A;   CORONARY STENT PLACEMENT     IR ANGIOGRAM SELECTIVE EACH ADDITIONAL VESSEL  07/13/2019   IR ANGIOGRAM SELECTIVE EACH ADDITIONAL VESSEL  07/13/2019   IR ANGIOGRAM SELECTIVE EACH ADDITIONAL VESSEL  07/13/2019   IR ANGIOGRAM SELECTIVE EACH ADDITIONAL VESSEL  07/13/2019   IR ANGIOGRAM SELECTIVE EACH ADDITIONAL VESSEL  07/13/2019   IR ANGIOGRAM SELECTIVE EACH ADDITIONAL VESSEL  07/13/2019   IR ANGIOGRAM SELECTIVE EACH ADDITIONAL VESSEL  07/27/2019   IR ANGIOGRAM SELECTIVE EACH ADDITIONAL VESSEL  07/27/2019   IR ANGIOGRAM VISCERAL SELECTIVE  07/13/2019   IR ANGIOGRAM VISCERAL SELECTIVE  07/27/2019   IR EMBO ARTERIAL NOT HEMORR HEMANG INC GUIDE ROADMAPPING  07/13/2019   IR EMBO TUMOR ORGAN ISCHEMIA INFARCT INC GUIDE ROADMAPPING  07/27/2019   IR FLUORO GUIDE CV LINE RIGHT  04/03/2018   IR RADIOLOGIST EVAL & MGMT  06/01/2019   IR RADIOLOGIST EVAL & MGMT  06/09/2019   IR US GUIDE VASC ACCESS RIGHT  07/13/2019   IR US GUIDE VASC ACCESS RIGHT  07/27/2019   kidney removed Left 2013   LEFT HEART CATH AND CORONARY ANGIOGRAPHY N/A 09/28/2018   Procedure: LEFT HEART CATH AND CORONARY ANGIOGRAPHY;  Surgeon: [name], MD;  Location: [facility];  Service: Cardiovascular;  Laterality: N/A;    FAMILY HISTORY: Family History  Problem Relation Age of Onset   Coronary artery disease Mother    Anemia Mother    Kidney failure Mother    Subarachnoid hemorrhage Father    Breast cancer Sister 25   Hypertension Brother     ADVANCED DIRECTIVES (Y/N):  N  HEALTH MAINTENANCE: Social History   Tobacco  Use   Smoking status: Former Smoker    Packs/day: 1.00    Years: 55.00    Pack years: 55.00    Types: Cigarettes    Quit date: 03/20/2012    Years since quitting: 7.3   Smokeless tobacco: Never Used  Substance Use Topics   Alcohol use: Yes    Comment: Social   Drug use: No     Colonoscopy:  PAP:  Bone density:  Lipid panel:  Allergies  Allergen Reactions   Clopidogrel Rash and Hives   Rosuvastatin Calcium Anaphylaxis   Etodolac Nausea And Vomiting and Other (See Comments)    Bad dreams Bad dreams    Hydrocodone Other (See Comments)    Bad dreams Bad dreams    Other    Statins    Meloxicam Rash    Bad dreams Bad dreams    Pravastatin Sodium Rash    Current Outpatient Medications  Medication Sig Dispense Refill   acetaminophen (TYLENOL) 325 MG tablet Take 325 mg by mouth every 4 (four) hours as needed.      ALPRAZolam (XANAX) 0.25 MG tablet      amLODipine (NORVASC) 10 MG tablet TAKE 1 TABLET BY MOUTH EVERY DAY     aspirin EC 81 MG tablet Take by mouth.     carvedilol (COREG) 6.25 MG tablet Take 1 tablet (6.25 mg total) by mouth 2 (two) times daily. 60 tablet 6   clopidogrel (PLAVIX) 75 MG tablet Take 75 mg by mouth daily.     docusate sodium (COLACE) 100 MG capsule Take 100 mg by mouth 2 (two) times daily as needed for mild constipation. 2 in am, 3 in pm     ezetimibe (ZETIA) 10 MG tablet TAKE 1 TABLET BY MOUTH DAILY 90 tablet 0   famotidine (PEPCID) 20 MG tablet Take 20 mg by mouth 2 (two) times daily.      Garlic 937 MG TABS Take by mouth.      HYDROcodone-acetaminophen (NORCO/VICODIN) 5-325 MG tablet Take 1 tablet by mouth every 6 (six) hours as needed for moderate pain. 120 tablet 0   linaclotide (LINZESS) 72 MCG capsule Take 1 capsule (72 mcg total) by mouth daily before breakfast. 30 capsule 0   magnesium hydroxide (MILK OF MAGNESIA) 400 MG/5ML suspension Take 30 mLs by mouth.     megestrol (MEGACE) 20 MG tablet Take 1 tablet (20  mg total) by mouth daily. 30 tablet 1   metoCLOPramide (REGLAN) 10 MG tablet Take 1 tablet (10 mg total) by mouth 4 (four) times daily. 30 tablet 1   Multiple Vitamin (MULTI-VITAMINS) TABS Take by mouth.     nitroGLYCERIN (NITROSTAT) 0.4 MG SL tablet Place under the tongue.     Omega-3 Fatty Acids (FISH OIL OMEGA-3) 1000 MG CAPS Take by mouth daily.     ondansetron (ZOFRAN) 8 MG tablet Take 1 tablet (8 mg total) by mouth every 8 (eight) hours as needed for nausea or vomiting. 30 tablet 3   polyethylene glycol (MIRALAX / GLYCOLAX) 17 g packet Take 17 g by mouth 2 (two) times daily.      prochlorperazine (COMPAZINE) 10 MG tablet Take 1 tablet (10  mg total) by mouth every 6 (six) hours as needed for nausea or vomiting. 30 tablet 0   sucralfate (CARAFATE) 1 g tablet Take 1 tablet (1 g total) by mouth 3 (three) times daily. 90 tablet 3   traZODone (DESYREL) 50 MG tablet Take 1-2 tablets (50-100 mg total) by mouth at bedtime. 30 tablet 0   No current facility-administered medications for this visit.    Facility-Administered Medications Ordered in Other Visits  Medication Dose Route Frequency Provider Last Rate Last Dose   sodium chloride flush (NS) 0.9 % injection 10 mL  10 mL Intravenous PRN [name], MD   10 mL at 10/05/18 1026    OBJECTIVE: Vitals:   08/02/19 1022  BP: 107/71  Pulse: 98  Temp: 97.9 F (36.6 C)     Body mass index is 24.01 kg/m.    ECOG FS:0 - Asymptomatic   General: Thin, no acute distress. Eyes: Pink conjunctiva, anicteric sclera. HEENT: Normocephalic, moist mucous membranes. Lungs: Clear to auscultation bilaterally. Heart: Regular rate and rhythm. No rubs, murmurs, or gallops. Abdomen: Soft, nontender, nondistended. No organomegaly noted, normoactive bowel sounds. Musculoskeletal: No edema, cyanosis, or clubbing. Neuro: Alert, answering all questions appropriately. Cranial nerves grossly intact. Skin: No rashes or petechiae noted. Psych:  Normal affect.  LAB RESULTS:  Lab Results  Component Value Date   NA 129 (L) 08/02/2019   K 4.1 08/02/2019   CL 102 08/02/2019   CO2 19 (L) 08/02/2019   GLUCOSE 115 (H) 08/02/2019   BUN 18 08/02/2019   CREATININE 0.88 08/02/2019   CALCIUM 9.4 08/02/2019   PROT 6.3 (L) 08/02/2019   ALBUMIN 2.6 (L) 08/02/2019   AST 55 (H) 08/02/2019   ALT 56 (H) 08/02/2019   ALKPHOS 261 (H) 08/02/2019   BILITOT 0.6 08/02/2019   GFRNONAA >60 08/02/2019   GFRAA >60 08/02/2019    Lab Results  Component Value Date   WBC 10.1 08/02/2019   NEUTROABS 8.1 (H) 08/02/2019   HGB 10.6 (L) 08/02/2019   HCT 33.8 (L) 08/02/2019   MCV 95.5 08/02/2019   PLT 142 (L) 08/02/2019     STUDIES: Nm Liver Tumor Loc Imflam Spect 1 Day  Result Date: 07/27/2019 CLINICAL DATA:  Unresectable liver metastasis. Bladder carcinoma primary. First treatment to the RIGHT hepatic lobe. EXAM: NUCLEAR MEDICINE SPECIAL MED RAD PHYSICS CONS; NUCLEAR MEDICINE RADIO PHARM THERAPY INTRA ARTERIAL; NUCLEAR MEDICINE TREATMENT PROCEDURE; NUCLEAR MEDICINE LIVER SCAN TECHNIQUE: In conjunction with the interventional radiologist a Y- Microsphere dose was calculated utilizing body surface area formulation. Calculated dose equal 28.2 mCi. Pre therapy MAA liver SPECT scan and CTA were evaluated. Utilizing a microcatheter system, the RIGHT hepatic artery was selected and Y-90 microspheres were delivered in fractionated aliquots. Radiopharmaceutical was delivered by the interventional radiologist and nuclear radiologist. The patient tolerated procedure well. No adverse effects were noted. Bremsstrahlung planar and SPECT imaging of the abdomen following intrahepatic arterial delivery of Y-90 microsphere was performed. RADIOPHARMACEUTICALS:  94.8 millicuries yttrium 90 microspheres. COMPARISON:  PET-CT 06/17/2019, MRI 06/03/2019 FINDINGS: Y - 90 microspheres therapy as above. First therapy the right hepatic lobe. Bremsstrahlung planar and SPECT imaging  of the abdomen following intrahepatic arterial delivery of Y-77mcrosphere demonstrates radioactivity localized to the RIGHT hepatic lobe. No evidence of extrahepatic activity. IMPRESSION: Successful Y - 90 microsphere delivery for treatment of unresectable liver metastasis. First therapy to the RIGHT lobe. Bremssstrahlung scan demonstrates activity localized to RIGHT hepatic lobe with no extrahepatic activity identified. Electronically Signed   By: SNicole Kindred  Leonia Reeves M.D.   On: 07/27/2019 12:57   Nm Liver Tumor Loc Imflam Spect 1 Day  Result Date: 07/13/2019 CLINICAL DATA:  Bladder carcinoma with liver metastasis. Additional nodal metastasis and spine metastasis. Evaluation for yttrium 90 radiotherapy to the liver. EXAM: ULTRASOUND MISCELLANEOUS SOFT TISSUE; NUCLEAR MEDICINE LIVER SCAN TECHNIQUE: Abdominal images were obtained in multiple projections after intrahepatic arterial injection of radiopharmaceutical. SPECT imaging was performed. Lung shunt calculation was performed. RADIOPHARMACEUTICALS:  5.2 millicuries technetium 99 MAA COMPARISON:  PET-CT 06/17/2019, MRI abdomen 06/03/2019 FINDINGS: The injected microaggregated albumin localizes within the liver. No evidence of activity within the stomach, duodenum, or bowel. Calculated shunt fraction to the lungs equals 22%. IMPRESSION: 1. No significant extrahepatic radiotracer activity following intrahepatic arterial injection of MAA. 2. Significant lung shunt fraction equals 22% Electronically Signed   By: Suzy Bouchard M.D.   On: 07/13/2019 17:01   Ir Angiogram Visceral Selective  Result Date: 07/27/2019 INDICATION: History of metastatic bladder cancer. Patient presents today for Y 90 radioembolization of the right lobe of the liver Note, pre Y 90 mapping radioembolization demonstrated a right gastric artery however this cannot be successfully coil embolized. As such will also attempt repeat coil embolization of the right gastric artery. EXAM: 1.  ULTRASOUND GUIDANCE FOR ARTERIAL ACCESS 2. CELIAC ARTERIOGRAM 3. RIGHT GASTRIC ARTERIOGRAM 4. RIGHT HEPATIC AND RADIOEMBOLIZATION OF THE RIGHT LOBE OF THE LIVER 5. FLUOROSCOPIC GUIDED ADMINISTRATION OF SIR-SPHERES COMPARISON:  Mapping Y 90 radioembolization-07/13/2019; PET-CT-06/17/2019; CT abdomen pelvis-05/24/2019; abdominal MRI - 06/03/2019 MEDICATIONS: Protonix 40 mg IV; Decadron 20 mg IV; Toradol 30 mg IV; Zofran 4 mg IV CONTRAST:  80 cc OMNIPAQUE IOHEXOL 300 MG/ML  SOLN ANESTHESIA/SEDATION: Moderate (conscious) sedation was employed during this procedure. A total of Versed 3 mg and Fentanyl 200 mcg was administered intravenously. Moderate Sedation Time: 70 minutes. The patient's level of consciousness and vital signs were monitored continuously by radiology nursing throughout the procedure under my direct supervision. FLUOROSCOPY TIME:  15 minutes, 30 seconds (1,208 mGy) ACCESS: Right common femoral artery; hemostasis achieved with manual compression. COMPLICATIONS: None immediate. TECHNIQUE: Informed written consent was obtained from the patient after a discussion of the risks, benefits and alternatives to treatment. Questions regarding the procedure were encouraged and answered. A timeout was performed prior to the initiation of the procedure. The right groin was prepped and draped in the usual sterile fashion, and a sterile drape was applied covering the operative field. Maximum barrier sterile technique with sterile gowns and gloves were used for the procedure. A timeout was performed prior to the initiation of the procedure. Local anesthesia was provided with 1% lidocaine. The right femoral head was marked fluoroscopically. Under direct ultrasound guidance, the right common femoral artery was accessed with a micropuncture kit allowing placement of a of 5-French vascular sheath. An ultrasound image was saved for documentation purposes. A limited arteriogram performed through the side arm of the sheath  confirming appropriate access within the right common femoral artery. Over a Britta Mccreedy wire, a Mickelson catheter was advanced to the level of the inferior thoracic aorta where it was reformed, back bled and flushed. The Mickelson catheter was utilized to select the celiac artery and a celiac arteriogram was performed. Next, with the use of a fathom 14 microwire, a regular Renegade microcatheter was utilized to select the right gastric artery. Despite successful cannulation of the origin the right gastric artery and advancement of a microwire to its mid aspect, the microcatheter would not track beyond the origin the vessel secondary to  tortuosity of the vessel's origin as well as it's small caliber. Next, the microcatheter was advanced to the level of the right hepatic artery and a selective right hepatic arteriogram was performed. Appropriate position was confirmed and the Radioembolization was then performed with Yttrium-90 SIR Spheres. Particles were administered via a microcatheter utilizing a completely enclosed system. Monitoring of antegrade flow was performed during and after the administration under fluoroscopy with use of contrast intermittently. After the administration of the particles, the microcatheter, outer catheter and administration system were then discarded into radiation safety receptacle. At this point, the procedure was terminated. The right common femoral approach vascular sheath was removed and hemostasis was achieved with manual compression. A dressing was placed. The patient tolerated procedure well without immediate postprocedural complication. All participants involved with the procedure were surveyed by the radiation safety officer prior to exiting the fluoroscopy suite. The patient was escorted to the nuclear medicine department for post-treatment imaging. FINDINGS: Selective celiac arteriogram demonstrates complete embolization of the gastroduodenal artery without residual flow.  Selective right gastric arteriogram demonstrates patency and tortuosity/narrowing of the origin and proximal aspect. The origin of the right gastric artery was again noted to be adjacent to the dominant left hepatic artery. Despite catheterization of the origin of the vessel and advancement of a microwire to its mid aspect, the microcatheter could not be advanced beyond the origin to allow for safe percutaneous coil embolization. 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 The microcatheter was placed proximal to the origin of the accessory right hepatic artery and Y 90 radioembolization was performed from this location. Intermittent contrast injections during and after the radioembolization confirming preserved antegrade flow without reflux. IMPRESSION: 1. Technically successful radioembolization of the right lobe of the liver with Yttrium-90 microspheres. 2. Attempted though unsuccessful coil embolization of the right gastric artery secondary to tortuosity of the vessel's origin and small caliber of the vessel. PLAN: - Given inability to successfully percutaneously coil embolized the right gastric artery, Y 90 radioembolization of left lobe of the liver will NOT be pursued given proximity of the right gastric artery to the origin of the left hepatic artery and concern for nontarget embolization in this patient with progressive/advanced intra and extrahepatic metastatic disease. - The patient will be seen in follow-up consultation in the IR clinic in 3-4 weeks following acquisition of postprocedural CMP. - initial surveillance imaging will be performed in 3 months (likely with PET-CT). Electronically Signed   By: Sandi Mariscal M.D.   On: 07/27/2019 13:14   Ir Angiogram Visceral Selective  Result Date: 07/13/2019 INDICATION: History of metastatic bladder cancer. Patient presents today for mapping/pre  Y 90 radioembolization. EXAM: 1. ULTRASOUND GUIDANCE FOR ARTERIAL ACCESS 2. CELIAC AND SUPERIOR MESENTERIC ARTERIOGRAM (1st ORDER) 3. COMMON HEPATIC ARTERIOGRAM (3rd ORDER) 4. GASTRODUODENAL ARTERIOGRAM (3rd ORDER) AND PERCUTANEOUS COIL EMBOLIZATION 5. SELECTIVE LEFT HEPATIC ARTERIOGRAM (3rd ORDER) 6. SELECTIVE RIGHT GASTRIC ARTERIOGRAM (3rd ORDER) 7. SELECTIVE RIGHT HEPATIC ARTERIOGRAM (3rd ORDER) 8. PROPER HEPATIC ARTERIOGRAM (3rd ORDER) AND FLUOROSCOPIC GUIDED ADMINISTRATION OF Tc62mMAA COMPARISON:  CT abdomen pelvis-05/24/2019; PET-CT-06/17/2019 MEDICATIONS: Zosyn 3.375 g IV RADIOPHARMACEUTICALS:  5 mCi Tc965mAA administered via the proper hepatic artery CONTRAST:  140 cc Omnipaque 300 ANESTHESIA/SEDATION: Moderate (conscious) sedation was employed during this procedure. A total of Versed 3 mg and Fentanyl 100 mcg was administered intravenously. Moderate Sedation Time: 88 minutes. The patient's level of consciousness and vital signs were  monitored continuously by radiology nursing throughout the procedure under my direct supervision. FLUOROSCOPY TIME:  26 minutes, 42 seconds (3,865 mGy) ACCESS: Right common femoral artery; hemostasis achieved with manual compression. COMPLICATIONS: None immediate. TECHNIQUE: Informed written consent was obtained from the patient after a discussion of the risks, benefits and alternatives to treatment. Questions regarding the procedure were encouraged and answered. A timeout was performed prior to the initiation of the procedure. The right groin was prepped and draped in the usual sterile fashion, and a sterile drape was applied covering the operative field. Maximum barrier sterile technique with sterile gowns and gloves were used for the procedure. A timeout was performed prior to the initiation of the procedure. Local anesthesia was provided with 1% lidocaine. The right femoral head was marked fluoroscopically. Under ultrasound guidance, the right common femoral artery was  accessed with a micropuncture kit after the overlying soft tissues were anesthetized with 1% lidocaine. An ultrasound image was saved for documentation purposes. The micropuncture sheath was exchanged for a 5 [place] vascular sheath over a Bentson wire. A closure arteriogram was performed through the side of the sheath confirming access within the right common femoral artery. Over a Bentson wire, a Mickelson catheter was advanced to the level of the thoracic aorta where it was back bled and flushed. The catheter was then utilized to select the superior mesenteric artery and a superior mesenteric arteriogram with delayed portal venous imaging was performed. The Mickelson catheter was then advanced cranially and utilized to select the celiac artery and a celiac arteriogram was performed. Utilizing a fathom 14 microwire, a regular Renegade microcatheter was advanced into the common hepatic artery and a selective common hepatic arteriogram was performed. The microcatheter was then utilized to select the gastroduodenal artery and a selective gastroduodenal arteriogram was performed. Next, the gastroduodenal artery was percutaneously coil embolized to near its origin with multiple overlapping 4 mm and 5 mm soft and standard interlock coils. The microcatheter was retracted into the common hepatic artery and a post embolization common hepatic arteriogram was performed. Next, the microcatheter was utilized select both the right and left hepatic arteries and selective arteriograms was performed at both locations. During catheter manipulation, the previously not identified right gastric artery was inadvertently selected. Attempts were made to embolize the origin of the vessel, however this proved unsuccessful secondary to lack of purchase of the microcatheter at the tiny vessel's origin and inability to cannulate the vessel further. As such, the microcatheter was advanced to the level of proper hepatic artery and a selective  proper hepatic arteriogram was performed and 5 mCi of technetium 99 MAA was administered for this location. At this point, the procedure was terminated. All wires and catheters and sheaths were removed from the patient. Hemostasis was achieved at the right groin and access site with manual compression. A dressing was placed. The patient tolerated procedure well without immediate postprocedural complication. The patient was escorted to nuclear medicine department for planar imaging. FINDINGS: Selective superior mesenteric arteriogram is negative for definitive replaced or accessory hepatic arterial supply. Delayed portal venous imaging demonstrates patency of the main, right and left portal veins. Selective celiac arteriogram demonstrates non conventional anatomy with a separate accessory medial segmental branch of the right hepatic artery arising proximal to vessel's normal bifurcation. A right gastric artery was inadvertently cannulated and sub selectively injected, however despite prolonged efforts, the vessel could not be further cannulated to allow for successful percutaneous coil embolization. Selective right and left hepatic arteriogram demonstrate otherwise conventional  branching pattern without definitive evidence of extrahepatic arterial supply. Successful administration of MAA from the level of the proper hepatic artery, IMPRESSION: 1. Successful pre Y-90 arteriogram with percutaneous coil embolization of the gastroduodenal artery. 2. Successful right gastric arteriogram, however the vessel could not be percutaneously coil embolized secondary to tortuosity and small caliber of the vessel's origin. 3. Successful administration of 5 mCi of technetium 22mMAA via the proper hepatic artery. Awaiting results of nuclear medicine liver scan. PLAN: Pending the results of the nuclear medicine liver scan, the patient will return for Y 90 radioembolization of the right lobe of the liver. At the time of the planned  right lobe radioembolization additional attempts will be made to coil the right gastric artery. Electronically Signed   By: JSandi MariscalM.D.   On: 07/13/2019 12:22   Ir Angiogram Selective Each Additional Vessel  Result Date: 07/27/2019 INDICATION: History of metastatic bladder cancer. Patient presents today for Y 90 radioembolization of the right lobe of the liver Note, pre Y 90 mapping radioembolization demonstrated a right gastric artery however this cannot be successfully coil embolized. As such will also attempt repeat coil embolization of the right gastric artery. EXAM: 1. ULTRASOUND GUIDANCE FOR ARTERIAL ACCESS 2. CELIAC ARTERIOGRAM 3. RIGHT GASTRIC ARTERIOGRAM 4. RIGHT HEPATIC AND RADIOEMBOLIZATION OF THE RIGHT LOBE OF THE LIVER 5. FLUOROSCOPIC GUIDED ADMINISTRATION OF SIR-SPHERES COMPARISON:  Mapping Y 90 radioembolization-07/13/2019; PET-CT-06/17/2019; CT abdomen pelvis-05/24/2019; abdominal MRI - 06/03/2019 MEDICATIONS: Protonix 40 mg IV; Decadron 20 mg IV; Toradol 30 mg IV; Zofran 4 mg IV CONTRAST:  80 cc OMNIPAQUE IOHEXOL 300 MG/ML  SOLN ANESTHESIA/SEDATION: Moderate (conscious) sedation was employed during this procedure. A total of Versed 3 mg and Fentanyl 200 mcg was administered intravenously. Moderate Sedation Time: 70 minutes. The patient's level of consciousness and vital signs were monitored continuously by radiology nursing throughout the procedure under my direct supervision. FLUOROSCOPY TIME:  15 minutes, 30 seconds (1,208 mGy) ACCESS: Right common femoral artery; hemostasis achieved with manual compression. COMPLICATIONS: None immediate. TECHNIQUE: Informed written consent was obtained from the patient after a discussion of the risks, benefits and alternatives to treatment. Questions regarding the procedure were encouraged and answered. A timeout was performed prior to the initiation of the procedure. The right groin was prepped and draped in the usual sterile fashion, and a sterile  drape was applied covering the operative field. Maximum barrier sterile technique with sterile gowns and gloves were used for the procedure. A timeout was performed prior to the initiation of the procedure. Local anesthesia was provided with 1% lidocaine. The right femoral head was marked fluoroscopically. Under direct ultrasound guidance, the right common femoral artery was accessed with a micropuncture kit allowing placement of a of 5-French vascular sheath. An ultrasound image was saved for documentation purposes. A limited arteriogram performed through the side arm of the sheath confirming appropriate access within the right common femoral artery. Over a BBritta Mccreedywire, a Mickelson catheter was advanced to the level of the inferior thoracic aorta where it was reformed, back bled and flushed. The Mickelson catheter was utilized to select the celiac artery and a celiac arteriogram was performed. Next, with the use of a fathom 14 microwire, a regular Renegade microcatheter was utilized to select the right gastric artery. Despite successful cannulation of the origin the right gastric artery and advancement of a microwire to its mid aspect, the microcatheter would not track beyond the origin the vessel secondary to tortuosity of the vessel's origin as well as  it's small caliber. Next, the microcatheter was advanced to the level of the right hepatic artery and a selective right hepatic arteriogram was performed. Appropriate position was confirmed and the Radioembolization was then performed with Yttrium-90 SIR Spheres. Particles were administered via a microcatheter utilizing a completely enclosed system. Monitoring of antegrade flow was performed during and after the administration under fluoroscopy with use of contrast intermittently. After the administration of the particles, the microcatheter, outer catheter and administration system were then discarded into radiation safety receptacle. At this point, the procedure  was terminated. The right common femoral approach vascular sheath was removed and hemostasis was achieved with manual compression. A dressing was placed. The patient tolerated procedure well without immediate postprocedural complication. All participants involved with the procedure were surveyed by the radiation safety officer prior to exiting the fluoroscopy suite. The patient was escorted to the nuclear medicine department for post-treatment imaging. FINDINGS: Selective celiac arteriogram demonstrates complete embolization of the gastroduodenal artery without residual flow. Selective right gastric arteriogram demonstrates patency and tortuosity/narrowing of the origin and proximal aspect. The origin of the right gastric artery was again noted to be adjacent to the dominant left hepatic artery. Despite catheterization of the origin of the vessel and advancement of a microwire to its mid aspect, the microcatheter could not be advanced beyond the origin to allow for safe percutaneous coil embolization. 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 The microcatheter was placed proximal to the origin of the accessory right hepatic artery and Y 90 radioembolization was performed from this location. Intermittent contrast injections during and after the radioembolization confirming preserved antegrade flow without reflux. IMPRESSION: 1. Technically successful radioembolization of the right lobe of the liver with Yttrium-90 microspheres. 2. Attempted though unsuccessful coil embolization of the right gastric artery secondary to tortuosity of the vessel's origin and small caliber of the vessel. PLAN: - Given inability to successfully percutaneously coil embolized the right gastric artery, Y 90 radioembolization of left lobe of the liver will NOT be pursued given proximity of the right gastric artery to the  origin of the left hepatic artery and concern for nontarget embolization in this patient with progressive/advanced intra and extrahepatic metastatic disease. - The patient will be seen in follow-up consultation in the IR clinic in 3-4 weeks following acquisition of postprocedural CMP. - initial surveillance imaging will be performed in 3 months (likely with PET-CT). Electronically Signed   By: Sandi Mariscal M.D.   On: 07/27/2019 13:14   Ir Angiogram Selective Each Additional Vessel  Result Date: 07/27/2019 INDICATION: History of metastatic bladder cancer. Patient presents today for Y 90 radioembolization of the right lobe of the liver Note, pre Y 90 mapping radioembolization demonstrated a right gastric artery however this cannot be successfully coil embolized. As such will also attempt repeat coil embolization of the right gastric artery. EXAM: 1. ULTRASOUND GUIDANCE FOR ARTERIAL ACCESS 2. CELIAC ARTERIOGRAM 3. RIGHT GASTRIC ARTERIOGRAM 4. RIGHT HEPATIC AND RADIOEMBOLIZATION OF THE RIGHT LOBE OF THE LIVER 5. FLUOROSCOPIC GUIDED ADMINISTRATION OF SIR-SPHERES COMPARISON:  Mapping Y 90 radioembolization-07/13/2019; PET-CT-06/17/2019; CT abdomen pelvis-05/24/2019; abdominal MRI - 06/03/2019 MEDICATIONS: Protonix 40 mg IV; Decadron 20 mg IV; Toradol 30 mg IV; Zofran 4 mg IV CONTRAST:  80 cc OMNIPAQUE IOHEXOL 300 MG/ML  SOLN ANESTHESIA/SEDATION: Moderate (conscious) sedation was employed during this procedure. A total of Versed 3 mg and Fentanyl 200 mcg was administered intravenously. Moderate Sedation Time:  70 minutes. The patient's level of consciousness and vital signs were monitored continuously by radiology nursing throughout the procedure under my direct supervision. FLUOROSCOPY TIME:  15 minutes, 30 seconds (1,208 mGy) ACCESS: Right common femoral artery; hemostasis achieved with manual compression. COMPLICATIONS: None immediate. TECHNIQUE: Informed written consent was obtained from the patient after a  discussion of the risks, benefits and alternatives to treatment. Questions regarding the procedure were encouraged and answered. A timeout was performed prior to the initiation of the procedure. The right groin was prepped and draped in the usual sterile fashion, and a sterile drape was applied covering the operative field. Maximum barrier sterile technique with sterile gowns and gloves were used for the procedure. A timeout was performed prior to the initiation of the procedure. Local anesthesia was provided with 1% lidocaine. The right femoral head was marked fluoroscopically. Under direct ultrasound guidance, the right common femoral artery was accessed with a micropuncture kit allowing placement of a of 5-French vascular sheath. An ultrasound image was saved for documentation purposes. A limited arteriogram performed through the side arm of the sheath confirming appropriate access within the right common femoral artery. Over a Britta Mccreedy wire, a Mickelson catheter was advanced to the level of the inferior thoracic aorta where it was reformed, back bled and flushed. The Mickelson catheter was utilized to select the celiac artery and a celiac arteriogram was performed. Next, with the use of a fathom 14 microwire, a regular Renegade microcatheter was utilized to select the right gastric artery. Despite successful cannulation of the origin the right gastric artery and advancement of a microwire to its mid aspect, the microcatheter would not track beyond the origin the vessel secondary to tortuosity of the vessel's origin as well as it's small caliber. Next, the microcatheter was advanced to the level of the right hepatic artery and a selective right hepatic arteriogram was performed. Appropriate position was confirmed and the Radioembolization was then performed with Yttrium-90 SIR Spheres. Particles were administered via a microcatheter utilizing a completely enclosed system. Monitoring of antegrade flow was performed  during and after the administration under fluoroscopy with use of contrast intermittently. After the administration of the particles, the microcatheter, outer catheter and administration system were then discarded into radiation safety receptacle. At this point, the procedure was terminated. The right common femoral approach vascular sheath was removed and hemostasis was achieved with manual compression. A dressing was placed. The patient tolerated procedure well without immediate postprocedural complication. All participants involved with the procedure were surveyed by the radiation safety officer prior to exiting the fluoroscopy suite. The patient was escorted to the nuclear medicine department for post-treatment imaging. FINDINGS: Selective celiac arteriogram demonstrates complete embolization of the gastroduodenal artery without residual flow. Selective right gastric arteriogram demonstrates patency and tortuosity/narrowing of the origin and proximal aspect. The origin of the right gastric artery was again noted to be adjacent to the dominant left hepatic artery. Despite catheterization of the origin of the vessel and advancement of a microwire to its mid aspect, the microcatheter could not be advanced beyond the origin to allow for safe percutaneous coil embolization. 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 The microcatheter was placed proximal to the origin of the accessory right hepatic artery and Y 90 radioembolization was performed from this location. Intermittent contrast injections during and after the radioembolization confirming preserved antegrade flow without reflux. IMPRESSION: 1. Technically successful radioembolization of the  right lobe of the liver with Yttrium-90 microspheres. 2. Attempted though unsuccessful coil embolization of the right gastric artery secondary to tortuosity  of the vessel's origin and small caliber of the vessel. PLAN: - Given inability to successfully percutaneously coil embolized the right gastric artery, Y 90 radioembolization of left lobe of the liver will NOT be pursued given proximity of the right gastric artery to the origin of the left hepatic artery and concern for nontarget embolization in this patient with progressive/advanced intra and extrahepatic metastatic disease. - The patient will be seen in follow-up consultation in the IR clinic in 3-4 weeks following acquisition of postprocedural CMP. - initial surveillance imaging will be performed in 3 months (likely with PET-CT). Electronically Signed   By: Sandi Mariscal M.D.   On: 07/27/2019 13:14   Ir Angiogram Selective Each Additional Vessel  Result Date: 07/13/2019 INDICATION: History of metastatic bladder cancer. Patient presents today for mapping/pre Y 90 radioembolization. EXAM: 1. ULTRASOUND GUIDANCE FOR ARTERIAL ACCESS 2. CELIAC AND SUPERIOR MESENTERIC ARTERIOGRAM (1st ORDER) 3. COMMON HEPATIC ARTERIOGRAM (3rd ORDER) 4. GASTRODUODENAL ARTERIOGRAM (3rd ORDER) AND PERCUTANEOUS COIL EMBOLIZATION 5. SELECTIVE LEFT HEPATIC ARTERIOGRAM (3rd ORDER) 6. SELECTIVE RIGHT GASTRIC ARTERIOGRAM (3rd ORDER) 7. SELECTIVE RIGHT HEPATIC ARTERIOGRAM (3rd ORDER) 8. PROPER HEPATIC ARTERIOGRAM (3rd ORDER) AND FLUOROSCOPIC GUIDED ADMINISTRATION OF Tc14mMAA COMPARISON:  CT abdomen pelvis-05/24/2019; PET-CT-06/17/2019 MEDICATIONS: Zosyn 3.375 g IV RADIOPHARMACEUTICALS:  5 mCi Tc941mAA administered via the proper hepatic artery CONTRAST:  140 cc Omnipaque 300 ANESTHESIA/SEDATION: Moderate (conscious) sedation was employed during this procedure. A total of Versed 3 mg and Fentanyl 100 mcg was administered intravenously. Moderate Sedation Time: 88 minutes. The patient's level of consciousness and vital signs were monitored continuously by radiology nursing throughout the procedure under my direct supervision. FLUOROSCOPY TIME:   26 minutes, 42 seconds (3,865 mGy) ACCESS: Right common femoral artery; hemostasis achieved with manual compression. COMPLICATIONS: None immediate. TECHNIQUE: Informed written consent was obtained from the patient after a discussion of the risks, benefits and alternatives to treatment. Questions regarding the procedure were encouraged and answered. A timeout was performed prior to the initiation of the procedure. The right groin was prepped and draped in the usual sterile fashion, and a sterile drape was applied covering the operative field. Maximum barrier sterile technique with sterile gowns and gloves were used for the procedure. A timeout was performed prior to the initiation of the procedure. Local anesthesia was provided with 1% lidocaine. The right femoral head was marked fluoroscopically. Under ultrasound guidance, the right common femoral artery was accessed with a micropuncture kit after the overlying soft tissues were anesthetized with 1% lidocaine. An ultrasound image was saved for documentation purposes. The micropuncture sheath was exchanged for a 5 [place] sheath over a Bentson wire. A closure arteriogram was performed through the side of the sheath confirming access within the right common femoral artery. Over a Bentson wire, a Mickelson catheter was advanced to the level of the thoracic aorta where it was back bled and flushed. The catheter was then utilized to select the superior mesenteric artery and a superior mesenteric arteriogram with delayed portal venous imaging was performed. The Mickelson catheter was then advanced cranially and utilized to select the celiac artery and a celiac arteriogram was performed. Utilizing a fathom 14 microwire, a regular Renegade microcatheter was advanced into the common hepatic artery and a selective common hepatic arteriogram was performed. The microcatheter was then utilized to select the gastroduodenal artery and a selective gastroduodenal arteriogram  was  performed. Next, the gastroduodenal artery was percutaneously coil embolized to near its origin with multiple overlapping 4 mm and 5 mm soft and standard interlock coils. The microcatheter was retracted into the common hepatic artery and a post embolization common hepatic arteriogram was performed. Next, the microcatheter was utilized select both the right and left hepatic arteries and selective arteriograms was performed at both locations. During catheter manipulation, the previously not identified right gastric artery was inadvertently selected. Attempts were made to embolize the origin of the vessel, however this proved unsuccessful secondary to lack of purchase of the microcatheter at the tiny vessel's origin and inability to cannulate the vessel further. As such, the microcatheter was advanced to the level of proper hepatic artery and a selective proper hepatic arteriogram was performed and 5 mCi of technetium 99 MAA was administered for this location. At this point, the procedure was terminated. All wires and catheters and sheaths were removed from the patient. Hemostasis was achieved at the right groin and access site with manual compression. A dressing was placed. The patient tolerated procedure well without immediate postprocedural complication. The patient was escorted to nuclear medicine department for planar imaging. FINDINGS: Selective superior mesenteric arteriogram is negative for definitive replaced or accessory hepatic arterial supply. Delayed portal venous imaging demonstrates patency of the main, right and left portal veins. Selective celiac arteriogram demonstrates non conventional anatomy with a separate accessory medial segmental branch of the right hepatic artery arising proximal to vessel's normal bifurcation. A right gastric artery was inadvertently cannulated and sub selectively injected, however despite prolonged efforts, the vessel could not be further cannulated to allow for  successful percutaneous coil embolization. Selective right and left hepatic arteriogram demonstrate otherwise conventional branching pattern without definitive evidence of extrahepatic arterial supply. Successful administration of MAA from the level of the proper hepatic artery, IMPRESSION: 1. Successful pre Y-90 arteriogram with percutaneous coil embolization of the gastroduodenal artery. 2. Successful right gastric arteriogram, however the vessel could not be percutaneously coil embolized secondary to tortuosity and small caliber of the vessel's origin. 3. Successful administration of 5 mCi of technetium 85mMAA via the proper hepatic artery. Awaiting results of nuclear medicine liver scan. PLAN: Pending the results of the nuclear medicine liver scan, the patient will return for Y 90 radioembolization of the right lobe of the liver. At the time of the planned right lobe radioembolization additional attempts will be made to coil the right gastric artery. Electronically Signed   By: JSandi MariscalM.D.   On: 07/13/2019 12:22   Ir Angiogram Selective Each Additional Vessel  Result Date: 07/13/2019 INDICATION: History of metastatic bladder cancer. Patient presents today for mapping/pre Y 90 radioembolization. EXAM: 1. ULTRASOUND GUIDANCE FOR ARTERIAL ACCESS 2. CELIAC AND SUPERIOR MESENTERIC ARTERIOGRAM (1st ORDER) 3. COMMON HEPATIC ARTERIOGRAM (3rd ORDER) 4. GASTRODUODENAL ARTERIOGRAM (3rd ORDER) AND PERCUTANEOUS COIL EMBOLIZATION 5. SELECTIVE LEFT HEPATIC ARTERIOGRAM (3rd ORDER) 6. SELECTIVE RIGHT GASTRIC ARTERIOGRAM (3rd ORDER) 7. SELECTIVE RIGHT HEPATIC ARTERIOGRAM (3rd ORDER) 8. PROPER HEPATIC ARTERIOGRAM (3rd ORDER) AND FLUOROSCOPIC GUIDED ADMINISTRATION OF Tc927mAA COMPARISON:  CT abdomen pelvis-05/24/2019; PET-CT-06/17/2019 MEDICATIONS: Zosyn 3.375 g IV RADIOPHARMACEUTICALS:  5 mCi Tc998mA administered via the proper hepatic artery CONTRAST:  140 cc Omnipaque 300 ANESTHESIA/SEDATION: Moderate (conscious)  sedation was employed during this procedure. A total of Versed 3 mg and Fentanyl 100 mcg was administered intravenously. Moderate Sedation Time: 88 minutes. The patient's level of consciousness and vital signs were monitored continuously by radiology nursing throughout the procedure under my direct  supervision. FLUOROSCOPY TIME:  26 minutes, 42 seconds (3,865 mGy) ACCESS: Right common femoral artery; hemostasis achieved with manual compression. COMPLICATIONS: None immediate. TECHNIQUE: Informed written consent was obtained from the patient after a discussion of the risks, benefits and alternatives to treatment. Questions regarding the procedure were encouraged and answered. A timeout was performed prior to the initiation of the procedure. The right groin was prepped and draped in the usual sterile fashion, and a sterile drape was applied covering the operative field. Maximum barrier sterile technique with sterile gowns and gloves were used for the procedure. A timeout was performed prior to the initiation of the procedure. Local anesthesia was provided with 1% lidocaine. The right femoral head was marked fluoroscopically. Under ultrasound guidance, the right common femoral artery was accessed with a micropuncture kit after the overlying soft tissues were anesthetized with 1% lidocaine. An ultrasound image was saved for documentation purposes. The micropuncture sheath was exchanged for a 5 [place] vascular sheath over a Bentson wire. A closure arteriogram was performed through the side of the sheath confirming access within the right common femoral artery. Over a Bentson wire, a Mickelson catheter was advanced to the level of the thoracic aorta where it was back bled and flushed. The catheter was then utilized to select the superior mesenteric artery and a superior mesenteric arteriogram with delayed portal venous imaging was performed. The Mickelson catheter was then advanced cranially and utilized to select the  celiac artery and a celiac arteriogram was performed. Utilizing a fathom 14 microwire, a regular Renegade microcatheter was advanced into the common hepatic artery and a selective common hepatic arteriogram was performed. The microcatheter was then utilized to select the gastroduodenal artery and a selective gastroduodenal arteriogram was performed. Next, the gastroduodenal artery was percutaneously coil embolized to near its origin with multiple overlapping 4 mm and 5 mm soft and standard interlock coils. The microcatheter was retracted into the common hepatic artery and a post embolization common hepatic arteriogram was performed. Next, the microcatheter was utilized select both the right and left hepatic arteries and selective arteriograms was performed at both locations. During catheter manipulation, the previously not identified right gastric artery was inadvertently selected. Attempts were made to embolize the origin of the vessel, however this proved unsuccessful secondary to lack of purchase of the microcatheter at the tiny vessel's origin and inability to cannulate the vessel further. As such, the microcatheter was advanced to the level of proper hepatic artery and a selective proper hepatic arteriogram was performed and 5 mCi of technetium 99 MAA was administered for this location. At this point, the procedure was terminated. All wires and catheters and sheaths were removed from the patient. Hemostasis was achieved at the right groin and access site with manual compression. A dressing was placed. The patient tolerated procedure well without immediate postprocedural complication. The patient was escorted to nuclear medicine department for planar imaging. FINDINGS: Selective superior mesenteric arteriogram is negative for definitive replaced or accessory hepatic arterial supply. Delayed portal venous imaging demonstrates patency of the main, right and left portal veins. Selective celiac arteriogram  demonstrates non conventional anatomy with a separate accessory medial segmental branch of the right hepatic artery arising proximal to vessel's normal bifurcation. A right gastric artery was inadvertently cannulated and sub selectively injected, however despite prolonged efforts, the vessel could not be further cannulated to allow for successful percutaneous coil embolization. Selective right and left hepatic arteriogram demonstrate otherwise conventional branching pattern without definitive evidence of extrahepatic arterial supply. Successful administration  of MAA from the level of the proper hepatic artery, IMPRESSION: 1. Successful pre Y-90 arteriogram with percutaneous coil embolization of the gastroduodenal artery. 2. Successful right gastric arteriogram, however the vessel could not be percutaneously coil embolized secondary to tortuosity and small caliber of the vessel's origin. 3. Successful administration of 5 mCi of technetium 28mMAA via the proper hepatic artery. Awaiting results of nuclear medicine liver scan. PLAN: Pending the results of the nuclear medicine liver scan, the patient will return for Y 90 radioembolization of the right lobe of the liver. At the time of the planned right lobe radioembolization additional attempts will be made to coil the right gastric artery. Electronically Signed   By: JSandi MariscalM.D.   On: 07/13/2019 12:22   Ir Angiogram Selective Each Additional Vessel  Result Date: 07/13/2019 INDICATION: History of metastatic bladder cancer. Patient presents today for mapping/pre Y 90 radioembolization. EXAM: 1. ULTRASOUND GUIDANCE FOR ARTERIAL ACCESS 2. CELIAC AND SUPERIOR MESENTERIC ARTERIOGRAM (1st ORDER) 3. COMMON HEPATIC ARTERIOGRAM (3rd ORDER) 4. GASTRODUODENAL ARTERIOGRAM (3rd ORDER) AND PERCUTANEOUS COIL EMBOLIZATION 5. SELECTIVE LEFT HEPATIC ARTERIOGRAM (3rd ORDER) 6. SELECTIVE RIGHT GASTRIC ARTERIOGRAM (3rd ORDER) 7. SELECTIVE RIGHT HEPATIC ARTERIOGRAM (3rd ORDER) 8.  PROPER HEPATIC ARTERIOGRAM (3rd ORDER) AND FLUOROSCOPIC GUIDED ADMINISTRATION OF Tc930mAA COMPARISON:  CT abdomen pelvis-05/24/2019; PET-CT-06/17/2019 MEDICATIONS: Zosyn 3.375 g IV RADIOPHARMACEUTICALS:  5 mCi Tc9963mA administered via the proper hepatic artery CONTRAST:  140 cc Omnipaque 300 ANESTHESIA/SEDATION: Moderate (conscious) sedation was employed during this procedure. A total of Versed 3 mg and Fentanyl 100 mcg was administered intravenously. Moderate Sedation Time: 88 minutes. The patient's level of consciousness and vital signs were monitored continuously by radiology nursing throughout the procedure under my direct supervision. FLUOROSCOPY TIME:  26 minutes, 42 seconds (3,865 mGy) ACCESS: Right common femoral artery; hemostasis achieved with manual compression. COMPLICATIONS: None immediate. TECHNIQUE: Informed written consent was obtained from the patient after a discussion of the risks, benefits and alternatives to treatment. Questions regarding the procedure were encouraged and answered. A timeout was performed prior to the initiation of the procedure. The right groin was prepped and draped in the usual sterile fashion, and a sterile drape was applied covering the operative field. Maximum barrier sterile technique with sterile gowns and gloves were used for the procedure. A timeout was performed prior to the initiation of the procedure. Local anesthesia was provided with 1% lidocaine. The right femoral head was marked fluoroscopically. Under ultrasound guidance, the right common femoral artery was accessed with a micropuncture kit after the overlying soft tissues were anesthetized with 1% lidocaine. An ultrasound image was saved for documentation purposes. The micropuncture sheath was exchanged for a 5 FrePakistanscular sheath over a Bentson wire. A closure arteriogram was performed through the side of the sheath confirming access within the right common femoral artery. Over a Bentson wire, a  Mickelson catheter was advanced to the level of the thoracic aorta where it was back bled and flushed. The catheter was then utilized to select the superior mesenteric artery and a superior mesenteric arteriogram with delayed portal venous imaging was performed. The Mickelson catheter was then advanced cranially and utilized to select the celiac artery and a celiac arteriogram was performed. Utilizing a fathom 14 microwire, a regular Renegade microcatheter was advanced into the common hepatic artery and a selective common hepatic arteriogram was performed. The microcatheter was then utilized to select the gastroduodenal artery and a selective gastroduodenal arteriogram was performed. Next, the gastroduodenal artery was percutaneously coil  embolized to near its origin with multiple overlapping 4 mm and 5 mm soft and standard interlock coils. The microcatheter was retracted into the common hepatic artery and a post embolization common hepatic arteriogram was performed. Next, the microcatheter was utilized select both the right and left hepatic arteries and selective arteriograms was performed at both locations. During catheter manipulation, the previously not identified right gastric artery was inadvertently selected. Attempts were made to embolize the origin of the vessel, however this proved unsuccessful secondary to lack of purchase of the microcatheter at the tiny vessel's origin and inability to cannulate the vessel further. As such, the microcatheter was advanced to the level of proper hepatic artery and a selective proper hepatic arteriogram was performed and 5 mCi of technetium 99 MAA was administered for this location. At this point, the procedure was terminated. All wires and catheters and sheaths were removed from the patient. Hemostasis was achieved at the right groin and access site with manual compression. A dressing was placed. The patient tolerated procedure well without immediate postprocedural  complication. The patient was escorted to nuclear medicine department for planar imaging. FINDINGS: Selective superior mesenteric arteriogram is negative for definitive replaced or accessory hepatic arterial supply. Delayed portal venous imaging demonstrates patency of the main, right and left portal veins. Selective celiac arteriogram demonstrates non conventional anatomy with a separate accessory medial segmental branch of the right hepatic artery arising proximal to vessel's normal bifurcation. A right gastric artery was inadvertently cannulated and sub selectively injected, however despite prolonged efforts, the vessel could not be further cannulated to allow for successful percutaneous coil embolization. Selective right and left hepatic arteriogram demonstrate otherwise conventional branching pattern without definitive evidence of extrahepatic arterial supply. Successful administration of MAA from the level of the proper hepatic artery, IMPRESSION: 1. Successful pre Y-90 arteriogram with percutaneous coil embolization of the gastroduodenal artery. 2. Successful right gastric arteriogram, however the vessel could not be percutaneously coil embolized secondary to tortuosity and small caliber of the vessel's origin. 3. Successful administration of 5 mCi of technetium 64mMAA via the proper hepatic artery. Awaiting results of nuclear medicine liver scan. PLAN: Pending the results of the nuclear medicine liver scan, the patient will return for Y 90 radioembolization of the right lobe of the liver. At the time of the planned right lobe radioembolization additional attempts will be made to coil the right gastric artery. Electronically Signed   By: JSandi MariscalM.D.   On: 07/13/2019 12:22   Ir Angiogram Selective Each Additional Vessel  Result Date: 07/13/2019 INDICATION: History of metastatic bladder cancer. Patient presents today for mapping/pre Y 90 radioembolization. EXAM: 1. ULTRASOUND GUIDANCE FOR ARTERIAL  ACCESS 2. CELIAC AND SUPERIOR MESENTERIC ARTERIOGRAM (1st ORDER) 3. COMMON HEPATIC ARTERIOGRAM (3rd ORDER) 4. GASTRODUODENAL ARTERIOGRAM (3rd ORDER) AND PERCUTANEOUS COIL EMBOLIZATION 5. SELECTIVE LEFT HEPATIC ARTERIOGRAM (3rd ORDER) 6. SELECTIVE RIGHT GASTRIC ARTERIOGRAM (3rd ORDER) 7. SELECTIVE RIGHT HEPATIC ARTERIOGRAM (3rd ORDER) 8. PROPER HEPATIC ARTERIOGRAM (3rd ORDER) AND FLUOROSCOPIC GUIDED ADMINISTRATION OF Tc970mAA COMPARISON:  CT abdomen pelvis-05/24/2019; PET-CT-06/17/2019 MEDICATIONS: Zosyn 3.375 g IV RADIOPHARMACEUTICALS:  5 mCi Tc9975mA administered via the proper hepatic artery CONTRAST:  140 cc Omnipaque 300 ANESTHESIA/SEDATION: Moderate (conscious) sedation was employed during this procedure. A total of Versed 3 mg and Fentanyl 100 mcg was administered intravenously. Moderate Sedation Time: 88 minutes. The patient's level of consciousness and vital signs were monitored continuously by radiology nursing throughout the procedure under my direct supervision. FLUOROSCOPY TIME:  26 minutes, 42 seconds (  3,865 mGy) ACCESS: Right common femoral artery; hemostasis achieved with manual compression. COMPLICATIONS: None immediate. TECHNIQUE: Informed written consent was obtained from the patient after a discussion of the risks, benefits and alternatives to treatment. Questions regarding the procedure were encouraged and answered. A timeout was performed prior to the initiation of the procedure. The right groin was prepped and draped in the usual sterile fashion, and a sterile drape was applied covering the operative field. Maximum barrier sterile technique with sterile gowns and gloves were used for the procedure. A timeout was performed prior to the initiation of the procedure. Local anesthesia was provided with 1% lidocaine. The right femoral head was marked fluoroscopically. Under ultrasound guidance, the right common femoral artery was accessed with a micropuncture kit after the overlying soft tissues  were anesthetized with 1% lidocaine. An ultrasound image was saved for documentation purposes. The micropuncture sheath was exchanged for a 5 [place] vascular sheath over a Bentson wire. A closure arteriogram was performed through the side of the sheath confirming access within the right common femoral artery. Over a Bentson wire, a Mickelson catheter was advanced to the level of the thoracic aorta where it was back bled and flushed. The catheter was then utilized to select the superior mesenteric artery and a superior mesenteric arteriogram with delayed portal venous imaging was performed. The Mickelson catheter was then advanced cranially and utilized to select the celiac artery and a celiac arteriogram was performed. Utilizing a fathom 14 microwire, a regular Renegade microcatheter was advanced into the common hepatic artery and a selective common hepatic arteriogram was performed. The microcatheter was then utilized to select the gastroduodenal artery and a selective gastroduodenal arteriogram was performed. Next, the gastroduodenal artery was percutaneously coil embolized to near its origin with multiple overlapping 4 mm and 5 mm soft and standard interlock coils. The microcatheter was retracted into the common hepatic artery and a post embolization common hepatic arteriogram was performed. Next, the microcatheter was utilized select both the right and left hepatic arteries and selective arteriograms was performed at both locations. During catheter manipulation, the previously not identified right gastric artery was inadvertently selected. Attempts were made to embolize the origin of the vessel, however this proved unsuccessful secondary to lack of purchase of the microcatheter at the tiny vessel's origin and inability to cannulate the vessel further. As such, the microcatheter was advanced to the level of proper hepatic artery and a selective proper hepatic arteriogram was performed and 5 mCi of technetium 99  MAA was administered for this location. At this point, the procedure was terminated. All wires and catheters and sheaths were removed from the patient. Hemostasis was achieved at the right groin and access site with manual compression. A dressing was placed. The patient tolerated procedure well without immediate postprocedural complication. The patient was escorted to nuclear medicine department for planar imaging. FINDINGS: Selective superior mesenteric arteriogram is negative for definitive replaced or accessory hepatic arterial supply. Delayed portal venous imaging demonstrates patency of the main, right and left portal veins. Selective celiac arteriogram demonstrates non conventional anatomy with a separate accessory medial segmental branch of the right hepatic artery arising proximal to vessel's normal bifurcation. A right gastric artery was inadvertently cannulated and sub selectively injected, however despite prolonged efforts, the vessel could not be further cannulated to allow for successful percutaneous coil embolization. Selective right and left hepatic arteriogram demonstrate otherwise conventional branching pattern without definitive evidence of extrahepatic arterial supply. Successful administration of MAA from the level of the proper  hepatic artery, IMPRESSION: 1. Successful pre Y-90 arteriogram with percutaneous coil embolization of the gastroduodenal artery. 2. Successful right gastric arteriogram, however the vessel could not be percutaneously coil embolized secondary to tortuosity and small caliber of the vessel's origin. 3. Successful administration of 5 mCi of technetium 41mMAA via the proper hepatic artery. Awaiting results of nuclear medicine liver scan. PLAN: Pending the results of the nuclear medicine liver scan, the patient will return for Y 90 radioembolization of the right lobe of the liver. At the time of the planned right lobe radioembolization additional attempts will be made to coil  the right gastric artery. Electronically Signed   By: JSandi MariscalM.D.   On: 07/13/2019 12:22   Ir Angiogram Selective Each Additional Vessel  Result Date: 07/13/2019 INDICATION: History of metastatic bladder cancer. Patient presents today for mapping/pre Y 90 radioembolization. EXAM: 1. ULTRASOUND GUIDANCE FOR ARTERIAL ACCESS 2. CELIAC AND SUPERIOR MESENTERIC ARTERIOGRAM (1st ORDER) 3. COMMON HEPATIC ARTERIOGRAM (3rd ORDER) 4. GASTRODUODENAL ARTERIOGRAM (3rd ORDER) AND PERCUTANEOUS COIL EMBOLIZATION 5. SELECTIVE LEFT HEPATIC ARTERIOGRAM (3rd ORDER) 6. SELECTIVE RIGHT GASTRIC ARTERIOGRAM (3rd ORDER) 7. SELECTIVE RIGHT HEPATIC ARTERIOGRAM (3rd ORDER) 8. PROPER HEPATIC ARTERIOGRAM (3rd ORDER) AND FLUOROSCOPIC GUIDED ADMINISTRATION OF [id number] COMPARISON:  CT abdomen pelvis-05/24/2019; PET-CT-06/17/2019 MEDICATIONS: Zosyn 3.375 g IV RADIOPHARMACEUTICALS:  5 mCi Tc9955mA administered via the proper hepatic artery CONTRAST:  140 cc Omnipaque 300 ANESTHESIA/SEDATION: Moderate (conscious) sedation was employed during this procedure. A total of Versed 3 mg and Fentanyl 100 mcg was administered intravenously. Moderate Sedation Time: 88 minutes. The patient's level of consciousness and vital signs were monitored continuously by radiology nursing throughout the procedure under my direct supervision. FLUOROSCOPY TIME:  26 minutes, 42 seconds (3,865 mGy) ACCESS: Right common femoral artery; hemostasis achieved with manual compression. COMPLICATIONS: None immediate. TECHNIQUE: Informed written consent was obtained from the patient after a discussion of the risks, benefits and alternatives to treatment. Questions regarding the procedure were encouraged and answered. A timeout was performed prior to the initiation of the procedure. The right groin was prepped and draped in the usual sterile fashion, and a sterile drape was applied covering the operative field. Maximum barrier sterile technique with sterile gowns and gloves  were used for the procedure. A timeout was performed prior to the initiation of the procedure. Local anesthesia was provided with 1% lidocaine. The right femoral head was marked fluoroscopically. Under ultrasound guidance, the right common femoral artery was accessed with a micropuncture kit after the overlying soft tissues were anesthetized with 1% lidocaine. An ultrasound image was saved for documentation purposes. The micropuncture sheath was exchanged for a 5 FrePakistanscular sheath over a Bentson wire. A closure arteriogram was performed through the side of the sheath confirming access within the right common femoral artery. Over a Bentson wire, a Mickelson catheter was advanced to the level of the thoracic aorta where it was back bled and flushed. The catheter was then utilized to select the superior mesenteric artery and a superior mesenteric arteriogram with delayed portal venous imaging was performed. The Mickelson catheter was then advanced cranially and utilized to select the celiac artery and a celiac arteriogram was performed. Utilizing a fathom 14 microwire, a regular Renegade microcatheter was advanced into the common hepatic artery and a selective common hepatic arteriogram was performed. The microcatheter was then utilized to select the gastroduodenal artery and a selective gastroduodenal arteriogram was performed. Next, the gastroduodenal artery was percutaneously coil embolized to near its origin with multiple overlapping  4 mm and 5 mm soft and standard interlock coils. The microcatheter was retracted into the common hepatic artery and a post embolization common hepatic arteriogram was performed. Next, the microcatheter was utilized select both the right and left hepatic arteries and selective arteriograms was performed at both locations. During catheter manipulation, the previously not identified right gastric artery was inadvertently selected. Attempts were made to embolize the origin of the  vessel, however this proved unsuccessful secondary to lack of purchase of the microcatheter at the tiny vessel's origin and inability to cannulate the vessel further. As such, the microcatheter was advanced to the level of proper hepatic artery and a selective proper hepatic arteriogram was performed and 5 mCi of technetium 99 MAA was administered for this location. At this point, the procedure was terminated. All wires and catheters and sheaths were removed from the patient. Hemostasis was achieved at the right groin and access site with manual compression. A dressing was placed. The patient tolerated procedure well without immediate postprocedural complication. The patient was escorted to nuclear medicine department for planar imaging. FINDINGS: Selective superior mesenteric arteriogram is negative for definitive replaced or accessory hepatic arterial supply. Delayed portal venous imaging demonstrates patency of the main, right and left portal veins. Selective celiac arteriogram demonstrates non conventional anatomy with a separate accessory medial segmental branch of the right hepatic artery arising proximal to vessel's normal bifurcation. A right gastric artery was inadvertently cannulated and sub selectively injected, however despite prolonged efforts, the vessel could not be further cannulated to allow for successful percutaneous coil embolization. Selective right and left hepatic arteriogram demonstrate otherwise conventional branching pattern without definitive evidence of extrahepatic arterial supply. Successful administration of MAA from the level of the proper hepatic artery, IMPRESSION: 1. Successful pre Y-90 arteriogram with percutaneous coil embolization of the gastroduodenal artery. 2. Successful right gastric arteriogram, however the vessel could not be percutaneously coil embolized secondary to tortuosity and small caliber of the vessel's origin. 3. Successful administration of 5 mCi of technetium  48mMAA via the proper hepatic artery. Awaiting results of nuclear medicine liver scan. PLAN: Pending the results of the nuclear medicine liver scan, the patient will return for Y 90 radioembolization of the right lobe of the liver. At the time of the planned right lobe radioembolization additional attempts will be made to coil the right gastric artery. Electronically Signed   By: JSandi MariscalM.D.   On: 07/13/2019 12:22   Ir Angiogram Selective Each Additional Vessel  Result Date: 07/13/2019 INDICATION: History of metastatic bladder cancer. Patient presents today for mapping/pre Y 90 radioembolization. EXAM: 1. ULTRASOUND GUIDANCE FOR ARTERIAL ACCESS 2. CELIAC AND SUPERIOR MESENTERIC ARTERIOGRAM (1st ORDER) 3. COMMON HEPATIC ARTERIOGRAM (3rd ORDER) 4. GASTRODUODENAL ARTERIOGRAM (3rd ORDER) AND PERCUTANEOUS COIL EMBOLIZATION 5. SELECTIVE LEFT HEPATIC ARTERIOGRAM (3rd ORDER) 6. SELECTIVE RIGHT GASTRIC ARTERIOGRAM (3rd ORDER) 7. SELECTIVE RIGHT HEPATIC ARTERIOGRAM (3rd ORDER) 8. PROPER HEPATIC ARTERIOGRAM (3rd ORDER) AND FLUOROSCOPIC GUIDED ADMINISTRATION OF [id number] COMPARISON:  CT abdomen pelvis-05/24/2019; PET-CT-06/17/2019 MEDICATIONS: Zosyn 3.375 g IV RADIOPHARMACEUTICALS:  5 mCi Tc9969mA administered via the proper hepatic artery CONTRAST:  140 cc Omnipaque 300 ANESTHESIA/SEDATION: Moderate (conscious) sedation was employed during this procedure. A total of Versed 3 mg and Fentanyl 100 mcg was administered intravenously. Moderate Sedation Time: 88 minutes. The patient's level of consciousness and vital signs were monitored continuously by radiology nursing throughout the procedure under my direct supervision. FLUOROSCOPY TIME:  26 minutes, 42 seconds (3,865 mGy) ACCESS: Right common femoral artery; hemostasis  achieved with manual compression. COMPLICATIONS: None immediate. TECHNIQUE: Informed written consent was obtained from the patient after a discussion of the risks, benefits and alternatives to  treatment. Questions regarding the procedure were encouraged and answered. A timeout was performed prior to the initiation of the procedure. The right groin was prepped and draped in the usual sterile fashion, and a sterile drape was applied covering the operative field. Maximum barrier sterile technique with sterile gowns and gloves were used for the procedure. A timeout was performed prior to the initiation of the procedure. Local anesthesia was provided with 1% lidocaine. The right femoral head was marked fluoroscopically. Under ultrasound guidance, the right common femoral artery was accessed with a micropuncture kit after the overlying soft tissues were anesthetized with 1% lidocaine. An ultrasound image was saved for documentation purposes. The micropuncture sheath was exchanged for a 5 [place] vascular sheath over a Bentson wire. A closure arteriogram was performed through the side of the sheath confirming access within the right common femoral artery. Over a Bentson wire, a Mickelson catheter was advanced to the level of the thoracic aorta where it was back bled and flushed. The catheter was then utilized to select the superior mesenteric artery and a superior mesenteric arteriogram with delayed portal venous imaging was performed. The Mickelson catheter was then advanced cranially and utilized to select the celiac artery and a celiac arteriogram was performed. Utilizing a fathom 14 microwire, a regular Renegade microcatheter was advanced into the common hepatic artery and a selective common hepatic arteriogram was performed. The microcatheter was then utilized to select the gastroduodenal artery and a selective gastroduodenal arteriogram was performed. Next, the gastroduodenal artery was percutaneously coil embolized to near its origin with multiple overlapping 4 mm and 5 mm soft and standard interlock coils. The microcatheter was retracted into the common hepatic artery and a post embolization common  hepatic arteriogram was performed. Next, the microcatheter was utilized select both the right and left hepatic arteries and selective arteriograms was performed at both locations. During catheter manipulation, the previously not identified right gastric artery was inadvertently selected. Attempts were made to embolize the origin of the vessel, however this proved unsuccessful secondary to lack of purchase of the microcatheter at the tiny vessel's origin and inability to cannulate the vessel further. As such, the microcatheter was advanced to the level of proper hepatic artery and a selective proper hepatic arteriogram was performed and 5 mCi of technetium 99 MAA was administered for this location. At this point, the procedure was terminated. All wires and catheters and sheaths were removed from the patient. Hemostasis was achieved at the right groin and access site with manual compression. A dressing was placed. The patient tolerated procedure well without immediate postprocedural complication. The patient was escorted to nuclear medicine department for planar imaging. FINDINGS: Selective superior mesenteric arteriogram is negative for definitive replaced or accessory hepatic arterial supply. Delayed portal venous imaging demonstrates patency of the main, right and left portal veins. Selective celiac arteriogram demonstrates non conventional anatomy with a separate accessory medial segmental branch of the right hepatic artery arising proximal to vessel's normal bifurcation. A right gastric artery was inadvertently cannulated and sub selectively injected, however despite prolonged efforts, the vessel could not be further cannulated to allow for successful percutaneous coil embolization. Selective right and left hepatic arteriogram demonstrate otherwise conventional branching pattern without definitive evidence of extrahepatic arterial supply. Successful administration of MAA from the level of the proper hepatic  artery, IMPRESSION: 1. Successful pre Y-90  arteriogram with percutaneous coil embolization of the gastroduodenal artery. 2. Successful right gastric arteriogram, however the vessel could not be percutaneously coil embolized secondary to tortuosity and small caliber of the vessel's origin. 3. Successful administration of 5 mCi of technetium 59mMAA via the proper hepatic artery. Awaiting results of nuclear medicine liver scan. PLAN: Pending the results of the nuclear medicine liver scan, the patient will return for Y 90 radioembolization of the right lobe of the liver. At the time of the planned right lobe radioembolization additional attempts will be made to coil the right gastric artery. Electronically Signed   By: JSandi MariscalM.D.   On: 07/13/2019 12:22   Nm Special Med Rad Physics Cons  Result Date: 07/27/2019 CLINICAL DATA:  Unresectable liver metastasis. Bladder carcinoma primary. First treatment to the RIGHT hepatic lobe. EXAM: NUCLEAR MEDICINE SPECIAL MED RAD PHYSICS CONS; NUCLEAR MEDICINE RADIO PHARM THERAPY INTRA ARTERIAL; NUCLEAR MEDICINE TREATMENT PROCEDURE; NUCLEAR MEDICINE LIVER SCAN TECHNIQUE: In conjunction with the interventional radiologist a Y- Microsphere dose was calculated utilizing body surface area formulation. Calculated dose equal 28.2 mCi. Pre therapy MAA liver SPECT scan and CTA were evaluated. Utilizing a microcatheter system, the RIGHT hepatic artery was selected and Y-90 microspheres were delivered in fractionated aliquots. Radiopharmaceutical was delivered by the interventional radiologist and nuclear radiologist. The patient tolerated procedure well. No adverse effects were noted. Bremsstrahlung planar and SPECT imaging of the abdomen following intrahepatic arterial delivery of Y-90 microsphere was performed. RADIOPHARMACEUTICALS:  278.2millicuries yttrium 90 microspheres. COMPARISON:  PET-CT 06/17/2019, MRI 06/03/2019 FINDINGS: Y - 90 microspheres therapy as above. First therapy  the right hepatic lobe. Bremsstrahlung planar and SPECT imaging of the abdomen following intrahepatic arterial delivery of Y-932mrosphere demonstrates radioactivity localized to the RIGHT hepatic lobe. No evidence of extrahepatic activity. IMPRESSION: Successful Y - 90 microsphere delivery for treatment of unresectable liver metastasis. First therapy to the RIGHT lobe. Bremssstrahlung scan demonstrates activity localized to RIGHT hepatic lobe with no extrahepatic activity identified. Electronically Signed   By: StSuzy Bouchard.D.   On: 07/27/2019 12:57   Nm Special Treatment Procedure  Result Date: 07/27/2019 CLINICAL DATA:  Unresectable liver metastasis. Bladder carcinoma primary. First treatment to the RIGHT hepatic lobe. EXAM: NUCLEAR MEDICINE SPECIAL MED RAD PHYSICS CONS; NUCLEAR MEDICINE RADIO PHARM THERAPY INTRA ARTERIAL; NUCLEAR MEDICINE TREATMENT PROCEDURE; NUCLEAR MEDICINE LIVER SCAN TECHNIQUE: In conjunction with the interventional radiologist a Y- Microsphere dose was calculated utilizing body surface area formulation. Calculated dose equal 28.2 mCi. Pre therapy MAA liver SPECT scan and CTA were evaluated. Utilizing a microcatheter system, the RIGHT hepatic artery was selected and Y-90 microspheres were delivered in fractionated aliquots. Radiopharmaceutical was delivered by the interventional radiologist and nuclear radiologist. The patient tolerated procedure well. No adverse effects were noted. Bremsstrahlung planar and SPECT imaging of the abdomen following intrahepatic arterial delivery of Y-90 microsphere was performed. RADIOPHARMACEUTICALS:  2842.3illicuries yttrium 90 microspheres. COMPARISON:  PET-CT 06/17/2019, MRI 06/03/2019 FINDINGS: Y - 90 microspheres therapy as above. First therapy the right hepatic lobe. Bremsstrahlung planar and SPECT imaging of the abdomen following intrahepatic arterial delivery of Y-9028mosphere demonstrates radioactivity localized to the RIGHT hepatic lobe.  No evidence of extrahepatic activity. IMPRESSION: Successful Y - 90 microsphere delivery for treatment of unresectable liver metastasis. First therapy to the RIGHT lobe. Bremssstrahlung scan demonstrates activity localized to RIGHT hepatic lobe with no extrahepatic activity identified. Electronically Signed   By: SteSuzy BouchardD.   On: 07/27/2019 12:57   Ir Us [place] Vasc  Access Right  Result Date: 07/27/2019 INDICATION: History of metastatic bladder cancer. Patient presents today for Y 90 radioembolization of the right lobe of the liver Note, pre Y 90 mapping radioembolization demonstrated a right gastric artery however this cannot be successfully coil embolized. As such will also attempt repeat coil embolization of the right gastric artery. EXAM: 1. ULTRASOUND GUIDANCE FOR ARTERIAL ACCESS 2. CELIAC ARTERIOGRAM 3. RIGHT GASTRIC ARTERIOGRAM 4. RIGHT HEPATIC AND RADIOEMBOLIZATION OF THE RIGHT LOBE OF THE LIVER 5. FLUOROSCOPIC GUIDED ADMINISTRATION OF SIR-SPHERES COMPARISON:  Mapping Y 90 radioembolization-07/13/2019; PET-CT-06/17/2019; CT abdomen pelvis-05/24/2019; abdominal MRI - 06/03/2019 MEDICATIONS: Protonix 40 mg IV; Decadron 20 mg IV; Toradol 30 mg IV; Zofran 4 mg IV CONTRAST:  80 cc OMNIPAQUE IOHEXOL 300 MG/ML  SOLN ANESTHESIA/SEDATION: Moderate (conscious) sedation was employed during this procedure. A total of Versed 3 mg and Fentanyl 200 mcg was administered intravenously. Moderate Sedation Time: 70 minutes. The patient's level of consciousness and vital signs were monitored continuously by radiology nursing throughout the procedure under my direct supervision. FLUOROSCOPY TIME:  15 minutes, 30 seconds (1,208 mGy) ACCESS: Right common femoral artery; hemostasis achieved with manual compression. COMPLICATIONS: None immediate. TECHNIQUE: Informed written consent was obtained from the patient after a discussion of the risks, benefits and alternatives to treatment. Questions regarding the procedure  were encouraged and answered. A timeout was performed prior to the initiation of the procedure. The right groin was prepped and draped in the usual sterile fashion, and a sterile drape was applied covering the operative field. Maximum barrier sterile technique with sterile gowns and gloves were used for the procedure. A timeout was performed prior to the initiation of the procedure. Local anesthesia was provided with 1% lidocaine. The right femoral head was marked fluoroscopically. Under direct ultrasound guidance, the right common femoral artery was accessed with a micropuncture kit allowing placement of a of 5-French vascular sheath. An ultrasound image was saved for documentation purposes. A limited arteriogram performed through the side arm of the sheath confirming appropriate access within the right common femoral artery. Over a Britta Mccreedy wire, a Mickelson catheter was advanced to the level of the inferior thoracic aorta where it was reformed, back bled and flushed. The Mickelson catheter was utilized to select the celiac artery and a celiac arteriogram was performed. Next, with the use of a fathom 14 microwire, a regular Renegade microcatheter was utilized to select the right gastric artery. Despite successful cannulation of the origin the right gastric artery and advancement of a microwire to its mid aspect, the microcatheter would not track beyond the origin the vessel secondary to tortuosity of the vessel's origin as well as it's small caliber. Next, the microcatheter was advanced to the level of the right hepatic artery and a selective right hepatic arteriogram was performed. Appropriate position was confirmed and the Radioembolization was then performed with Yttrium-90 SIR Spheres. Particles were administered via a microcatheter utilizing a completely enclosed system. Monitoring of antegrade flow was performed during and after the administration under fluoroscopy with use of contrast intermittently. After  the administration of the particles, the microcatheter, outer catheter and administration system were then discarded into radiation safety receptacle. At this point, the procedure was terminated. The right common femoral approach vascular sheath was removed and hemostasis was achieved with manual compression. A dressing was placed. The patient tolerated procedure well without immediate postprocedural complication. All participants involved with the procedure were surveyed by the radiation safety officer prior to exiting the fluoroscopy suite. The patient was escorted  to the nuclear medicine department for post-treatment imaging. FINDINGS: Selective celiac arteriogram demonstrates complete embolization of the gastroduodenal artery without residual flow. Selective right gastric arteriogram demonstrates patency and tortuosity/narrowing of the origin and proximal aspect. The origin of the right gastric artery was again noted to be adjacent to the dominant left hepatic artery. Despite catheterization of the origin of the vessel and advancement of a microwire to its mid aspect, the microcatheter could not be advanced beyond the origin to allow for safe percutaneous coil embolization. 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 The microcatheter was placed proximal to the origin of the accessory right hepatic artery and Y 90 radioembolization was performed from this location. Intermittent contrast injections during and after the radioembolization confirming preserved antegrade flow without reflux. IMPRESSION: 1. Technically successful radioembolization of the right lobe of the liver with Yttrium-90 microspheres. 2. Attempted though unsuccessful coil embolization of the right gastric artery secondary to tortuosity of the vessel's origin and small caliber of the vessel. PLAN: - Given inability to successfully  percutaneously coil embolized the right gastric artery, Y 90 radioembolization of left lobe of the liver will NOT be pursued given proximity of the right gastric artery to the origin of the left hepatic artery and concern for nontarget embolization in this patient with progressive/advanced intra and extrahepatic metastatic disease. - The patient will be seen in follow-up consultation in the IR clinic in 3-4 weeks following acquisition of postprocedural CMP. - initial surveillance imaging will be performed in 3 months (likely with PET-CT). Electronically Signed   By: Sandi Mariscal M.D.   On: 07/27/2019 13:14   Ir US Guide Vasc Access Right  Result Date: 07/13/2019 INDICATION: History of metastatic bladder cancer. Patient presents today for mapping/pre Y 90 radioembolization. EXAM: 1. ULTRASOUND GUIDANCE FOR ARTERIAL ACCESS 2. CELIAC AND SUPERIOR MESENTERIC ARTERIOGRAM (1st ORDER) 3. COMMON HEPATIC ARTERIOGRAM (3rd ORDER) 4. GASTRODUODENAL ARTERIOGRAM (3rd ORDER) AND PERCUTANEOUS COIL EMBOLIZATION 5. SELECTIVE LEFT HEPATIC ARTERIOGRAM (3rd ORDER) 6. SELECTIVE RIGHT GASTRIC ARTERIOGRAM (3rd ORDER) 7. SELECTIVE RIGHT HEPATIC ARTERIOGRAM (3rd ORDER) 8. PROPER HEPATIC ARTERIOGRAM (3rd ORDER) AND FLUOROSCOPIC GUIDED ADMINISTRATION OF Tc26mMAA COMPARISON:  CT abdomen pelvis-05/24/2019; PET-CT-06/17/2019 MEDICATIONS: Zosyn 3.375 g IV RADIOPHARMACEUTICALS:  5 mCi Tc986mAA administered via the proper hepatic artery CONTRAST:  140 cc Omnipaque 300 ANESTHESIA/SEDATION: Moderate (conscious) sedation was employed during this procedure. A total of Versed 3 mg and Fentanyl 100 mcg was administered intravenously. Moderate Sedation Time: 88 minutes. The patient's level of consciousness and vital signs were monitored continuously by radiology nursing throughout the procedure under my direct supervision. FLUOROSCOPY TIME:  26 minutes, 42 seconds (3,865 mGy) ACCESS: Right common femoral artery; hemostasis achieved with manual  compression. COMPLICATIONS: None immediate. TECHNIQUE: Informed written consent was obtained from the patient after a discussion of the risks, benefits and alternatives to treatment. Questions regarding the procedure were encouraged and answered. A timeout was performed prior to the initiation of the procedure. The right groin was prepped and draped in the usual sterile fashion, and a sterile drape was applied covering the operative field. Maximum barrier sterile technique with sterile gowns and gloves were used for the procedure. A timeout was performed prior to the initiation of the procedure. Local anesthesia was provided with 1% lidocaine. The right femoral head was marked fluoroscopically. Under ultrasound guidance, the right common femoral artery was accessed with a micropuncture kit after the overlying soft  tissues were anesthetized with 1% lidocaine. An ultrasound image was saved for documentation purposes. The micropuncture sheath was exchanged for a 5 [place] vascular sheath over a Bentson wire. A closure arteriogram was performed through the side of the sheath confirming access within the right common femoral artery. Over a Bentson wire, a Mickelson catheter was advanced to the level of the thoracic aorta where it was back bled and flushed. The catheter was then utilized to select the superior mesenteric artery and a superior mesenteric arteriogram with delayed portal venous imaging was performed. The Mickelson catheter was then advanced cranially and utilized to select the celiac artery and a celiac arteriogram was performed. Utilizing a fathom 14 microwire, a regular Renegade microcatheter was advanced into the common hepatic artery and a selective common hepatic arteriogram was performed. The microcatheter was then utilized to select the gastroduodenal artery and a selective gastroduodenal arteriogram was performed. Next, the gastroduodenal artery was percutaneously coil embolized to near its origin  with multiple overlapping 4 mm and 5 mm soft and standard interlock coils. The microcatheter was retracted into the common hepatic artery and a post embolization common hepatic arteriogram was performed. Next, the microcatheter was utilized select both the right and left hepatic arteries and selective arteriograms was performed at both locations. During catheter manipulation, the previously not identified right gastric artery was inadvertently selected. Attempts were made to embolize the origin of the vessel, however this proved unsuccessful secondary to lack of purchase of the microcatheter at the tiny vessel's origin and inability to cannulate the vessel further. As such, the microcatheter was advanced to the level of proper hepatic artery and a selective proper hepatic arteriogram was performed and 5 mCi of technetium 99 MAA was administered for this location. At this point, the procedure was terminated. All wires and catheters and sheaths were removed from the patient. Hemostasis was achieved at the right groin and access site with manual compression. A dressing was placed. The patient tolerated procedure well without immediate postprocedural complication. The patient was escorted to nuclear medicine department for planar imaging. FINDINGS: Selective superior mesenteric arteriogram is negative for definitive replaced or accessory hepatic arterial supply. Delayed portal venous imaging demonstrates patency of the main, right and left portal veins. Selective celiac arteriogram demonstrates non conventional anatomy with a separate accessory medial segmental branch of the right hepatic artery arising proximal to vessel's normal bifurcation. A right gastric artery was inadvertently cannulated and sub selectively injected, however despite prolonged efforts, the vessel could not be further cannulated to allow for successful percutaneous coil embolization. Selective right and left hepatic arteriogram demonstrate  otherwise conventional branching pattern without definitive evidence of extrahepatic arterial supply. Successful administration of MAA from the level of the proper hepatic artery, IMPRESSION: 1. Successful pre Y-90 arteriogram with percutaneous coil embolization of the gastroduodenal artery. 2. Successful right gastric arteriogram, however the vessel could not be percutaneously coil embolized secondary to tortuosity and small caliber of the vessel's origin. 3. Successful administration of 5 mCi of technetium 26mMAA via the proper hepatic artery. Awaiting results of nuclear medicine liver scan. PLAN: Pending the results of the nuclear medicine liver scan, the patient will return for Y 90 radioembolization of the right lobe of the liver. At the time of the planned right lobe radioembolization additional attempts will be made to coil the right gastric artery. Electronically Signed   By: JSandi MariscalM.D.   On: 07/13/2019 12:22   Ir Embo Arterial Not HCartago  Result Date: 07/13/2019 INDICATION: History of metastatic bladder cancer. Patient presents today for mapping/pre Y 90 radioembolization. EXAM: 1. ULTRASOUND GUIDANCE FOR ARTERIAL ACCESS 2. CELIAC AND SUPERIOR MESENTERIC ARTERIOGRAM (1st ORDER) 3. COMMON HEPATIC ARTERIOGRAM (3rd ORDER) 4. GASTRODUODENAL ARTERIOGRAM (3rd ORDER) AND PERCUTANEOUS COIL EMBOLIZATION 5. SELECTIVE LEFT HEPATIC ARTERIOGRAM (3rd ORDER) 6. SELECTIVE RIGHT GASTRIC ARTERIOGRAM (3rd ORDER) 7. SELECTIVE RIGHT HEPATIC ARTERIOGRAM (3rd ORDER) 8. PROPER HEPATIC ARTERIOGRAM (3rd ORDER) AND FLUOROSCOPIC GUIDED ADMINISTRATION OF Tc66mMAA COMPARISON:  CT abdomen pelvis-05/24/2019; PET-CT-06/17/2019 MEDICATIONS: Zosyn 3.375 g IV RADIOPHARMACEUTICALS:  5 mCi Tc942mAA administered via the proper hepatic artery CONTRAST:  140 cc Omnipaque 300 ANESTHESIA/SEDATION: Moderate (conscious) sedation was employed during this procedure. A total of Versed 3 mg and Fentanyl 100 mcg  was administered intravenously. Moderate Sedation Time: 88 minutes. The patient's level of consciousness and vital signs were monitored continuously by radiology nursing throughout the procedure under my direct supervision. FLUOROSCOPY TIME:  26 minutes, 42 seconds (3,865 mGy) ACCESS: Right common femoral artery; hemostasis achieved with manual compression. COMPLICATIONS: None immediate. TECHNIQUE: Informed written consent was obtained from the patient after a discussion of the risks, benefits and alternatives to treatment. Questions regarding the procedure were encouraged and answered. A timeout was performed prior to the initiation of the procedure. The right groin was prepped and draped in the usual sterile fashion, and a sterile drape was applied covering the operative field. Maximum barrier sterile technique with sterile gowns and gloves were used for the procedure. A timeout was performed prior to the initiation of the procedure. Local anesthesia was provided with 1% lidocaine. The right femoral head was marked fluoroscopically. Under ultrasound guidance, the right common femoral artery was accessed with a micropuncture kit after the overlying soft tissues were anesthetized with 1% lidocaine. An ultrasound image was saved for documentation purposes. The micropuncture sheath was exchanged for a 5 [place] sheath over a Bentson wire. A closure arteriogram was performed through the side of the sheath confirming access within the right common femoral artery. Over a Bentson wire, a Mickelson catheter was advanced to the level of the thoracic aorta where it was back bled and flushed. The catheter was then utilized to select the superior mesenteric artery and a superior mesenteric arteriogram with delayed portal venous imaging was performed. The Mickelson catheter was then advanced cranially and utilized to select the celiac artery and a celiac arteriogram was performed. Utilizing a fathom 14 microwire, a  regular Renegade microcatheter was advanced into the common hepatic artery and a selective common hepatic arteriogram was performed. The microcatheter was then utilized to select the gastroduodenal artery and a selective gastroduodenal arteriogram was performed. Next, the gastroduodenal artery was percutaneously coil embolized to near its origin with multiple overlapping 4 mm and 5 mm soft and standard interlock coils. The microcatheter was retracted into the common hepatic artery and a post embolization common hepatic arteriogram was performed. Next, the microcatheter was utilized select both the right and left hepatic arteries and selective arteriograms was performed at both locations. During catheter manipulation, the previously not identified right gastric artery was inadvertently selected. Attempts were made to embolize the origin of the vessel, however this proved unsuccessful secondary to lack of purchase of the microcatheter at the tiny vessel's origin and inability to cannulate the vessel further. As such, the microcatheter was advanced to the level of proper hepatic artery and a selective proper hepatic arteriogram was performed and 5 mCi of technetium 99 MAA was administered for this location. At  this point, the procedure was terminated. All wires and catheters and sheaths were removed from the patient. Hemostasis was achieved at the right groin and access site with manual compression. A dressing was placed. The patient tolerated procedure well without immediate postprocedural complication. The patient was escorted to nuclear medicine department for planar imaging. FINDINGS: Selective superior mesenteric arteriogram is negative for definitive replaced or accessory hepatic arterial supply. Delayed portal venous imaging demonstrates patency of the main, right and left portal veins. Selective celiac arteriogram demonstrates non conventional anatomy with a separate accessory medial segmental branch of the  right hepatic artery arising proximal to vessel's normal bifurcation. A right gastric artery was inadvertently cannulated and sub selectively injected, however despite prolonged efforts, the vessel could not be further cannulated to allow for successful percutaneous coil embolization. Selective right and left hepatic arteriogram demonstrate otherwise conventional branching pattern without definitive evidence of extrahepatic arterial supply. Successful administration of MAA from the level of the proper hepatic artery, IMPRESSION: 1. Successful pre Y-90 arteriogram with percutaneous coil embolization of the gastroduodenal artery. 2. Successful right gastric arteriogram, however the vessel could not be percutaneously coil embolized secondary to tortuosity and small caliber of the vessel's origin. 3. Successful administration of 5 mCi of technetium 66mMAA via the proper hepatic artery. Awaiting results of nuclear medicine liver scan. PLAN: Pending the results of the nuclear medicine liver scan, the patient will return for Y 90 radioembolization of the right lobe of the liver. At the time of the planned right lobe radioembolization additional attempts will be made to coil the right gastric artery. Electronically Signed   By: JSandi MariscalM.D.   On: 07/13/2019 12:22   Ir Embo Tumor Organ Ischemia Infarct Inc Guide Roadmapping  Result Date: 07/27/2019 INDICATION: History of metastatic bladder cancer. Patient presents today for Y 90 radioembolization of the right lobe of the liver Note, pre Y 90 mapping radioembolization demonstrated a right gastric artery however this cannot be successfully coil embolized. As such will also attempt repeat coil embolization of the right gastric artery. EXAM: 1. ULTRASOUND GUIDANCE FOR ARTERIAL ACCESS 2. CELIAC ARTERIOGRAM 3. RIGHT GASTRIC ARTERIOGRAM 4. RIGHT HEPATIC AND RADIOEMBOLIZATION OF THE RIGHT LOBE OF THE LIVER 5. FLUOROSCOPIC GUIDED ADMINISTRATION OF SIR-SPHERES COMPARISON:   Mapping Y 90 radioembolization-07/13/2019; PET-CT-06/17/2019; CT abdomen pelvis-05/24/2019; abdominal MRI - 06/03/2019 MEDICATIONS: Protonix 40 mg IV; Decadron 20 mg IV; Toradol 30 mg IV; Zofran 4 mg IV CONTRAST:  80 cc OMNIPAQUE IOHEXOL 300 MG/ML  SOLN ANESTHESIA/SEDATION: Moderate (conscious) sedation was employed during this procedure. A total of Versed 3 mg and Fentanyl 200 mcg was administered intravenously. Moderate Sedation Time: 70 minutes. The patient's level of consciousness and vital signs were monitored continuously by radiology nursing throughout the procedure under my direct supervision. FLUOROSCOPY TIME:  15 minutes, 30 seconds (1,208 mGy) ACCESS: Right common femoral artery; hemostasis achieved with manual compression. COMPLICATIONS: None immediate. TECHNIQUE: Informed written consent was obtained from the patient after a discussion of the risks, benefits and alternatives to treatment. Questions regarding the procedure were encouraged and answered. A timeout was performed prior to the initiation of the procedure. The right groin was prepped and draped in the usual sterile fashion, and a sterile drape was applied covering the operative field. Maximum barrier sterile technique with sterile gowns and gloves were used for the procedure. A timeout was performed prior to the initiation of the procedure. Local anesthesia was provided with 1% lidocaine. The right femoral head was marked fluoroscopically. Under direct ultrasound  guidance, the right common femoral artery was accessed with a micropuncture kit allowing placement of a of 5-French vascular sheath. An ultrasound image was saved for documentation purposes. A limited arteriogram performed through the side arm of the sheath confirming appropriate access within the right common femoral artery. Over a Britta Mccreedy wire, a Mickelson catheter was advanced to the level of the inferior thoracic aorta where it was reformed, back bled and flushed. The Mickelson  catheter was utilized to select the celiac artery and a celiac arteriogram was performed. Next, with the use of a fathom 14 microwire, a regular Renegade microcatheter was utilized to select the right gastric artery. Despite successful cannulation of the origin the right gastric artery and advancement of a microwire to its mid aspect, the microcatheter would not track beyond the origin the vessel secondary to tortuosity of the vessel's origin as well as it's small caliber. Next, the microcatheter was advanced to the level of the right hepatic artery and a selective right hepatic arteriogram was performed. Appropriate position was confirmed and the Radioembolization was then performed with Yttrium-90 SIR Spheres. Particles were administered via a microcatheter utilizing a completely enclosed system. Monitoring of antegrade flow was performed during and after the administration under fluoroscopy with use of contrast intermittently. After the administration of the particles, the microcatheter, outer catheter and administration system were then discarded into radiation safety receptacle. At this point, the procedure was terminated. The right common femoral approach vascular sheath was removed and hemostasis was achieved with manual compression. A dressing was placed. The patient tolerated procedure well without immediate postprocedural complication. All participants involved with the procedure were surveyed by the radiation safety officer prior to exiting the fluoroscopy suite. The patient was escorted to the nuclear medicine department for post-treatment imaging. FINDINGS: Selective celiac arteriogram demonstrates complete embolization of the gastroduodenal artery without residual flow. Selective right gastric arteriogram demonstrates patency and tortuosity/narrowing of the origin and proximal aspect. The origin of the right gastric artery was again noted to be adjacent to the dominant left hepatic artery. Despite  catheterization of the origin of the vessel and advancement of a microwire to its mid aspect, the microcatheter could not be advanced beyond the origin to allow for safe percutaneous coil embolization. 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 The microcatheter was placed proximal to the origin of the accessory right hepatic artery and Y 90 radioembolization was performed from this location. Intermittent contrast injections during and after the radioembolization confirming preserved antegrade flow without reflux. IMPRESSION: 1. Technically successful radioembolization of the right lobe of the liver with Yttrium-90 microspheres. 2. Attempted though unsuccessful coil embolization of the right gastric artery secondary to tortuosity of the vessel's origin and small caliber of the vessel. PLAN: - Given inability to successfully percutaneously coil embolized the right gastric artery, Y 90 radioembolization of left lobe of the liver will NOT be pursued given proximity of the right gastric artery to the origin of the left hepatic artery and concern for nontarget embolization in this patient with progressive/advanced intra and extrahepatic metastatic disease. - The patient will be seen in follow-up consultation in the IR clinic in 3-4 weeks following acquisition of postprocedural CMP. - initial surveillance imaging will be performed in 3 months (likely with PET-CT). Electronically Signed   By: Sandi Mariscal M.D.   On: 07/27/2019 13:14   Nm Radio Pharm Therapy Intraarterial  Result Date: 07/27/2019 CLINICAL  DATA:  Unresectable liver metastasis. Bladder carcinoma primary. First treatment to the RIGHT hepatic lobe. EXAM: NUCLEAR MEDICINE SPECIAL MED RAD PHYSICS CONS; NUCLEAR MEDICINE RADIO PHARM THERAPY INTRA ARTERIAL; NUCLEAR MEDICINE TREATMENT PROCEDURE; NUCLEAR MEDICINE LIVER SCAN TECHNIQUE: In conjunction  with the interventional radiologist a Y- Microsphere dose was calculated utilizing body surface area formulation. Calculated dose equal 28.2 mCi. Pre therapy MAA liver SPECT scan and CTA were evaluated. Utilizing a microcatheter system, the RIGHT hepatic artery was selected and Y-90 microspheres were delivered in fractionated aliquots. Radiopharmaceutical was delivered by the interventional radiologist and nuclear radiologist. The patient tolerated procedure well. No adverse effects were noted. Bremsstrahlung planar and SPECT imaging of the abdomen following intrahepatic arterial delivery of Y-90 microsphere was performed. RADIOPHARMACEUTICALS:  96.2 millicuries yttrium 90 microspheres. COMPARISON:  PET-CT 06/17/2019, MRI 06/03/2019 FINDINGS: Y - 90 microspheres therapy as above. First therapy the right hepatic lobe. Bremsstrahlung planar and SPECT imaging of the abdomen following intrahepatic arterial delivery of Y-13mcrosphere demonstrates radioactivity localized to the RIGHT hepatic lobe. No evidence of extrahepatic activity. IMPRESSION: Successful Y - 90 microsphere delivery for treatment of unresectable liver metastasis. First therapy to the RIGHT lobe. Bremssstrahlung scan demonstrates activity localized to RIGHT hepatic lobe with no extrahepatic activity identified. Electronically Signed   By: SSuzy BouchardM.D.   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 Evaluation for yttrium 90 radiotherapy to the liver. EXAM: ULTRASOUND MISCELLANEOUS SOFT TISSUE; NUCLEAR MEDICINE LIVER SCAN TECHNIQUE: Abdominal images were obtained in multiple projections after intrahepatic arterial injection of radiopharmaceutical. SPECT imaging was performed. Lung shunt calculation was performed. RADIOPHARMACEUTICALS:  5.2 millicuries technetium 99 MAA COMPARISON:  PET-CT 06/17/2019, MRI abdomen 06/03/2019  FINDINGS: The injected microaggregated albumin localizes within the liver. No evidence of activity within the stomach, duodenum, or bowel. Calculated shunt fraction to the lungs equals 22%. IMPRESSION: 1. No significant extrahepatic radiotracer activity following intrahepatic arterial injection of MAA. 2. Significant lung shunt fraction equals 22% Electronically Signed   By: SSuzy BouchardM.D.   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  PDL 1 0%  PLAN:    1.  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  PET scan results from June 17, 2019 reviewed independently revealing widespread, progressive metastatic disease  Patient has a new lytic lesion in his L3 vertebral body.  He expressed understanding that his treatment options are limited at this point.  Patient has completed Y 90 ablation to his hepatic metastasis with improvement of his flank pain.  Proceed with cycle 4 of Keytruda today.  Return to clinic in 3 weeks for further evaluation and consideration of cycle 5.   2.  Genetic testing: Negative. 3.  Kidney function: Patient's creatinine continues to be within normal limits. 4.  Cardiac disease: Continue follow-up with cardiology as indicated. 5.  Pain: MRI results from March 27, 2019 reviewed independently consistent with metastatic disease.  Patient is now completed XRT.  Continue fentanyl patch 12 mcg every 72 hours and oxycodone as needed.   6.  Constipation: Continue current bowel regimen and have recommended adding MiraLAX or magnesium citrate OTC. 7.  Bony metastasis: Patient last received Zometa on May 06, 2019. 8.  Abdominal pain: Improved with Y 90 ablation.  Appreciate interventional radiology  input.  9.  Leukopenia: Resolved. 10.  Anemia: Chronic and unchanged  with hemoglobin of 10.6 today. 11.  Nausea: Patient does not complain of this today.  Patient has been instructed to take his medication scheduled rather than as needed. 12.  Poor appetite: Continue Megace daily.  Patient has declined repeat visit with dietary at this time. 13.  Weakness and fatigue: We discussed the possibility of care program referral, but patient has declined and wishes to work with the plan set forth by his children.  Patient expressed understanding and was in agreement with this plan. He also understands that He can call clinic at any time with any questions, concerns, or complaints.   Cancer Staging Urothelial carcinoma of bladder [facility]) Staging form: Urinary Bladder, AJCC 8th Edition - Clinical: Stage IVA [place] Nancy, cN2, cM1a) - Signed by [name], MD on 04/09/2018   [name], MD   08/02/2019 3:31 PM

## 2019-08-02 ENCOUNTER — Inpatient Hospital Stay: Payer: Medicare Other | Admitting: *Deleted

## 2019-08-02 ENCOUNTER — Inpatient Hospital Stay: Payer: Medicare Other

## 2019-08-02 ENCOUNTER — Inpatient Hospital Stay (HOSPITAL_BASED_OUTPATIENT_CLINIC_OR_DEPARTMENT_OTHER): Payer: Medicare Other | Admitting: Oncology

## 2019-08-02 ENCOUNTER — Other Ambulatory Visit: Payer: Self-pay

## 2019-08-02 ENCOUNTER — Inpatient Hospital Stay (HOSPITAL_BASED_OUTPATIENT_CLINIC_OR_DEPARTMENT_OTHER): Payer: Medicare Other | Admitting: Hospice and Palliative Medicine

## 2019-08-02 VITALS — BP 107/71 | HR 98 | Temp 97.9°F | Wt 177.0 lb

## 2019-08-02 DIAGNOSIS — C679 Malignant neoplasm of bladder, unspecified: Secondary | ICD-10-CM | POA: Diagnosis not present

## 2019-08-02 DIAGNOSIS — Z515 Encounter for palliative care: Secondary | ICD-10-CM | POA: Diagnosis not present

## 2019-08-02 DIAGNOSIS — I2510 Atherosclerotic heart disease of native coronary artery without angina pectoris: Secondary | ICD-10-CM

## 2019-08-02 DIAGNOSIS — Z5112 Encounter for antineoplastic immunotherapy: Secondary | ICD-10-CM | POA: Diagnosis not present

## 2019-08-02 DIAGNOSIS — Z95828 Presence of other vascular implants and grafts: Secondary | ICD-10-CM

## 2019-08-02 LAB — COMPREHENSIVE METABOLIC PANEL
ALT: 56 U/L — ABNORMAL HIGH (ref 0–44)
AST: 55 U/L — ABNORMAL HIGH (ref 15–41)
Albumin: 2.6 g/dL — ABNORMAL LOW (ref 3.5–5.0)
Alkaline Phosphatase: 261 U/L — ABNORMAL HIGH (ref 38–126)
Anion gap: 8 (ref 5–15)
BUN: 18 mg/dL (ref 8–23)
CO2: 19 mmol/L — ABNORMAL LOW (ref 22–32)
Calcium: 9.4 mg/dL (ref 8.9–10.3)
Chloride: 102 mmol/L (ref 98–111)
Creatinine, Ser: 0.88 mg/dL (ref 0.61–1.24)
GFR calc Af Amer: >60 mL/min (ref >60)
GFR calc non Af Amer: >60 mL/min (ref >60)
Glucose, Bld: 115 mg/dL — ABNORMAL HIGH (ref 70–99)
Potassium: 4.1 mmol/L (ref 3.5–5.1)
Sodium: 129 mmol/L — ABNORMAL LOW (ref 135–145)
Total Bilirubin: 0.6 mg/dL (ref 0.3–1.2)
Total Protein: 6.3 g/dL — ABNORMAL LOW (ref 6.5–8.1)

## 2019-08-02 LAB — CBC WITH DIFFERENTIAL/PLATELET
Abs Immature Granulocytes: 0.05 10*3/uL (ref 0.00–0.07)
Basophils Absolute: 0 10*3/uL (ref 0.0–0.1)
Basophils Relative: 0 %
Eosinophils Absolute: 0.1 10*3/uL (ref 0.0–0.5)
Eosinophils Relative: 1 %
HCT: 33.8 % — ABNORMAL LOW (ref 39.0–52.0)
Hemoglobin: 10.6 g/dL — ABNORMAL LOW (ref 13.0–17.0)
Immature Granulocytes: 1 %
Lymphocytes Relative: 8 %
Lymphs Abs: 0.8 10*3/uL (ref 0.7–4.0)
MCH: 29.9 pg (ref 26.0–34.0)
MCHC: 31.4 g/dL (ref 30.0–36.0)
MCV: 95.5 fL (ref 80.0–100.0)
Monocytes Absolute: 1.1 10*3/uL — ABNORMAL HIGH (ref 0.1–1.0)
Monocytes Relative: 11 %
Neutro Abs: 8.1 10*3/uL — ABNORMAL HIGH (ref 1.7–7.7)
Neutrophils Relative %: 79 %
Platelets: 142 10*3/uL — ABNORMAL LOW (ref 150–400)
RBC: 3.54 MIL/uL — ABNORMAL LOW (ref 4.22–5.81)
RDW: 16.8 % — ABNORMAL HIGH (ref 11.5–15.5)
WBC: 10.1 10*3/uL (ref 4.0–10.5)
nRBC: 0 % (ref 0.0–0.2)

## 2019-08-02 MED ORDER — SODIUM CHLORIDE 0.9% FLUSH
10.0000 mL | Freq: Once | INTRAVENOUS | Status: AC
  Administered 2019-08-02: 10 mL via INTRAVENOUS
  Filled 2019-08-02: qty 10

## 2019-08-02 MED ORDER — ZOLEDRONIC ACID 4 MG/100ML IV SOLN
4.0000 mg | Freq: Once | INTRAVENOUS | Status: AC
  Administered 2019-08-02: 4 mg via INTRAVENOUS
  Filled 2019-08-02: qty 100

## 2019-08-02 MED ORDER — SODIUM CHLORIDE 0.9 % IV SOLN
Freq: Once | INTRAVENOUS | Status: AC
  Administered 2019-08-02: 11:00:00 via INTRAVENOUS
  Filled 2019-08-02: qty 250

## 2019-08-02 MED ORDER — SODIUM CHLORIDE 0.9 % IV SOLN
200.0000 mg | Freq: Once | INTRAVENOUS | Status: AC
  Administered 2019-08-02: 200 mg via INTRAVENOUS
  Filled 2019-08-02: qty 8

## 2019-08-02 MED ORDER — HEPARIN SOD (PORK) LOCK FLUSH 100 UNIT/ML IV SOLN
500.0000 [IU] | Freq: Once | INTRAVENOUS | Status: AC | PRN
  Administered 2019-08-02: 13:00:00 500 [IU]
  Filled 2019-08-02: qty 5

## 2019-08-02 NOTE — Progress Notes (Signed)
Sodium 129, Per Dr. Grayland Ormond okay to proceed with [facility]. Per Dr. Grayland Ormond pt to also receive Zometa at this time. Pt aware and agrees with plan.

## 2019-08-02 NOTE — Progress Notes (Signed)
Patient c/o fatigue. Denies any pain.

## 2019-08-03 LAB — THYROID PANEL WITH TSH
Free Thyroxine Index: 2.5 (ref 1.2–4.9)
T3 Uptake Ratio: 32 % (ref 24–39)
T4, Total: 7.8 ug/dL (ref 4.5–12.0)
TSH: 4.94 u[IU]/mL — ABNORMAL HIGH (ref 0.450–4.500)

## 2019-08-03 NOTE — Progress Notes (Signed)
[facility]  [phone] [phone]   Name: David Rich Date: 08/03/2019 MRN: [id number]  DOB: 1946-01-16  Patient Care Team: Baxter Hire, MD as PCP - General (Internal Medicine) [name], MD as PCP - Cardiology (Cardiology) [name], MD as Consulting Physician (Oncology)    REASON FOR CONSULTATION: Palliative Care consult requested for this 73 y.o. 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  Patient has had difficulty with nausea and pain.  He was referred to palliative care to help address goals and manage ongoing symptoms.   SOCIAL HISTORY:     reports that he quit smoking about 7 years ago. His smoking use included cigarettes. He has a 55.00 pack-year smoking history. He has never used smokeless tobacco. He reports current alcohol use. He reports that he does not use drugs.  Patient is divorced.  He lives at home alone.  He has a son and daughter, both of whom live in [place].  Patient formally worked in a Civil engineer, contracting.  ADVANCE DIRECTIVES:  Does not have  CODE STATUS:   PAST MEDICAL HISTORY: Past Medical History:  Diagnosis Date   Cancer ([place]) 2013   Right Renal    Hyperlipidemia    Hypertension    Renal disorder    Urothelial carcinoma of bladder ([place]) 2020    PAST SURGICAL HISTORY:  Past Surgical History:  Procedure Laterality Date   CORONARY STENT INTERVENTION N/A 09/28/2018   Procedure: CORONARY STENT INTERVENTION;  Surgeon: [name], MD;  Location: [facility];  Service: Cardiovascular;  Laterality: N/A;   CORONARY STENT PLACEMENT     IR ANGIOGRAM SELECTIVE EACH ADDITIONAL VESSEL  07/13/2019   IR ANGIOGRAM SELECTIVE EACH ADDITIONAL VESSEL  07/13/2019   IR ANGIOGRAM SELECTIVE EACH ADDITIONAL  VESSEL  07/13/2019   IR ANGIOGRAM SELECTIVE EACH ADDITIONAL VESSEL  07/13/2019   IR ANGIOGRAM SELECTIVE EACH ADDITIONAL VESSEL  07/13/2019   IR ANGIOGRAM SELECTIVE EACH ADDITIONAL VESSEL  07/13/2019   IR ANGIOGRAM SELECTIVE EACH ADDITIONAL VESSEL  07/27/2019   IR ANGIOGRAM SELECTIVE EACH ADDITIONAL VESSEL  07/27/2019   IR ANGIOGRAM VISCERAL SELECTIVE  07/13/2019   IR ANGIOGRAM VISCERAL SELECTIVE  07/27/2019   IR EMBO ARTERIAL NOT HEMORR HEMANG INC GUIDE ROADMAPPING  07/13/2019   IR EMBO TUMOR ORGAN ISCHEMIA INFARCT INC GUIDE ROADMAPPING  07/27/2019   IR FLUORO GUIDE CV LINE RIGHT  04/03/2018   IR RADIOLOGIST EVAL & MGMT  06/01/2019   IR RADIOLOGIST EVAL & MGMT  06/09/2019   IR US GUIDE VASC ACCESS RIGHT  07/13/2019   IR US GUIDE VASC ACCESS RIGHT  07/27/2019   kidney removed Left 2013   LEFT HEART CATH AND CORONARY ANGIOGRAPHY N/A 09/28/2018   Procedure: LEFT HEART CATH AND CORONARY ANGIOGRAPHY;  Surgeon: [name], MD;  Location: [facility];  Service: Cardiovascular;  Laterality: N/A;    HEMATOLOGY/ONCOLOGY HISTORY:  Oncology History  Urothelial carcinoma of bladder ([place])  03/23/2018 Initial Diagnosis   Urothelial carcinoma of bladder ([place])   03/26/2018 Cancer Staging   Staging form: Urinary Bladder, AJCC 8th Edition - Clinical: Stage IVA (cT4a, cN2, cM1a) - Signed by [name], MD on 04/09/2018   04/08/2018 - 05/19/2019 Chemotherapy   The patient had palonosetron (ALOXI) injection 0.25 mg, 0.25 mg, Intravenous,  Once, 5 of 7 cycles Administration: 0.25 mg (04/08/2018),  0.25 mg (04/15/2018), 0.25 mg (04/29/2018), 0.25 mg (05/06/2018), 0.25 mg (05/20/2018), 0.25 mg (05/27/2018), 0.25 mg (03/25/2019), 0.25 mg (04/08/2019), 0.25 mg (04/29/2019), 0.25 mg (05/06/2019) CISplatin (PLATINOL) 74 mg in sodium chloride 0.9 % 250 mL chemo infusion, 35 mg/m2 = 74 mg (100 % of original dose 35 mg/m2), Intravenous,  Once, 5 of 7 cycles Dose modification: 35 mg/m2 (original dose 35  mg/m2, Cycle 1, Reason: Other (see comments)) Administration: 74 mg (04/08/2018), 74 mg (04/15/2018), 74 mg (04/29/2018), 74 mg (05/06/2018), 74 mg (05/20/2018), 74 mg (05/27/2018), 74 mg (03/25/2019), 74 mg (04/08/2019), 74 mg (04/29/2019), 74 mg (05/06/2019) gemcitabine (GEMZAR) 2,200 mg in sodium chloride 0.9 % 250 mL chemo infusion, 2,128 mg, Intravenous,  Once, 5 of 7 cycles Dose modification: 800 mg/m2 (original dose 1,000 mg/m2, Cycle 3, Reason: Dose not tolerated) Administration: 2,200 mg (04/08/2018), 2,200 mg (04/15/2018), 2,200 mg (04/29/2018), 2,000 mg (05/06/2018), 1,600 mg (05/20/2018), 1,600 mg (05/27/2018), 1,600 mg (03/25/2019), 1,600 mg (04/08/2019), 1,600 mg (04/29/2019), 1,600 mg (05/06/2019) fosaprepitant (EMEND) 150 mg, dexamethasone (DECADRON) 12 mg in sodium chloride 0.9 % 145 mL IVPB, , Intravenous,  Once, 5 of 7 cycles Administration:  (04/08/2018),  (04/15/2018),  (04/29/2018),  (05/06/2018),  (05/20/2018),  (05/27/2018),  (03/25/2019),  (04/08/2019),  (04/29/2019),  (05/06/2019)  for chemotherapy treatment.    06/03/2019 -  Chemotherapy   The patient had pembrolizumab (KEYTRUDA) 200 mg in sodium chloride 0.9 % 50 mL chemo infusion, 200 mg, Intravenous, Once, 4 of 6 cycles Administration: 200 mg (06/03/2019), 200 mg (06/22/2019), 200 mg (07/12/2019), 200 mg (08/02/2019)  for chemotherapy treatment.      ALLERGIES:  is allergic to clopidogrel; rosuvastatin calcium; etodolac; hydrocodone; other; statins; meloxicam; and pravastatin sodium.  MEDICATIONS:  Current Outpatient Medications  Medication Sig Dispense Refill   acetaminophen (TYLENOL) 325 MG tablet Take 325 mg by mouth every 4 (four) hours as needed.      ALPRAZolam (XANAX) 0.25 MG tablet      amLODipine (NORVASC) 10 MG tablet TAKE 1 TABLET BY MOUTH EVERY DAY     aspirin EC 81 MG tablet Take by mouth.     carvedilol (COREG) 6.25 MG tablet Take 1 tablet (6.25 mg total) by mouth 2 (two) times daily. 60 tablet 6   clopidogrel (PLAVIX) 75 MG  tablet Take 75 mg by mouth daily.     docusate sodium (COLACE) 100 MG capsule Take 100 mg by mouth 2 (two) times daily as needed for mild constipation. 2 in am, 3 in pm     ezetimibe (ZETIA) 10 MG tablet TAKE 1 TABLET BY MOUTH DAILY 90 tablet 0   famotidine (PEPCID) 20 MG tablet Take 20 mg by mouth 2 (two) times daily.      Garlic 834 MG TABS Take by mouth.      HYDROcodone-acetaminophen (NORCO/VICODIN) 5-325 MG tablet Take 1 tablet by mouth every 6 (six) hours as needed for moderate pain. 120 tablet 0   linaclotide (LINZESS) 72 MCG capsule Take 1 capsule (72 mcg total) by mouth daily before breakfast. 30 capsule 0   magnesium hydroxide (MILK OF MAGNESIA) 400 MG/5ML suspension Take 30 mLs by mouth.     megestrol (MEGACE) 20 MG tablet Take 1 tablet (20 mg total) by mouth daily. 30 tablet 1   metoCLOPramide (REGLAN) 10 MG tablet Take 1 tablet (10 mg total) by mouth 4 (four) times daily. 30 tablet 1   Multiple Vitamin (MULTI-VITAMINS) TABS Take by mouth.     nitroGLYCERIN (NITROSTAT) 0.4 MG SL tablet Place  under the tongue.     Omega-3 Fatty Acids (FISH OIL OMEGA-3) 1000 MG CAPS Take by mouth daily.     ondansetron (ZOFRAN) 8 MG tablet Take 1 tablet (8 mg total) by mouth every 8 (eight) hours as needed for nausea or vomiting. 30 tablet 3   polyethylene glycol (MIRALAX / GLYCOLAX) 17 g packet Take 17 g by mouth 2 (two) times daily.      prochlorperazine (COMPAZINE) 10 MG tablet Take 1 tablet (10 mg total) by mouth every 6 (six) hours as needed for nausea or vomiting. 30 tablet 0   sucralfate (CARAFATE) 1 g tablet Take 1 tablet (1 g total) by mouth 3 (three) times daily. 90 tablet 3   traZODone (DESYREL) 50 MG tablet Take 1-2 tablets (50-100 mg total) by mouth at bedtime. 30 tablet 0   No current facility-administered medications for this visit.    Facility-Administered Medications Ordered in Other Visits  Medication Dose Route Frequency Provider Last Rate Last Dose   sodium  chloride flush (NS) 0.9 % injection 10 mL  10 mL Intravenous PRN [name], MD   10 mL at 10/05/18 1026    VITAL SIGNS: There were no vitals taken for this visit. There were no vitals filed for this visit.  Estimated body mass index is 24.01 kg/m as calculated from the following:   Height as of 07/13/19: 6' (1.829 m).   Weight as of an earlier encounter on 08/02/19: 177 lb (80.3 kg).  LABS: CBC:    Component Value Date/Time   WBC 10.1 08/02/2019 1005   HGB 10.6 (L) 08/02/2019 1005   HCT 33.8 (L) 08/02/2019 1005   PLT 142 (L) 08/02/2019 1005   MCV 95.5 08/02/2019 1005   NEUTROABS 8.1 (H) 08/02/2019 1005   LYMPHSABS 0.8 08/02/2019 1005   MONOABS 1.1 (H) 08/02/2019 1005   EOSABS 0.1 08/02/2019 1005   BASOSABS 0.0 08/02/2019 1005   Comprehensive Metabolic Panel:    Component Value Date/Time   NA 129 (L) 08/02/2019 1005   NA 139 03/23/2012 1029   K 4.1 08/02/2019 1005   K 4.1 03/23/2012 1029   CL 102 08/02/2019 1005   CL 108 (H) 03/23/2012 1029   CO2 19 (L) 08/02/2019 1005   CO2 25 03/23/2012 1029   BUN 18 08/02/2019 1005   BUN 16 03/23/2012 1029   CREATININE 0.88 08/02/2019 1005   CREATININE 1.27 03/23/2012 1029   GLUCOSE 115 (H) 08/02/2019 1005   GLUCOSE 83 03/23/2012 1029   CALCIUM 9.4 08/02/2019 1005   CALCIUM 8.8 03/23/2012 1029   AST 55 (H) 08/02/2019 1005   ALT 56 (H) 08/02/2019 1005   ALKPHOS 261 (H) 08/02/2019 1005   BILITOT 0.6 08/02/2019 1005   PROT 6.3 (L) 08/02/2019 1005   ALBUMIN 2.6 (L) 08/02/2019 1005    RADIOGRAPHIC STUDIES: Nm Liver Tumor Loc Imflam Spect 1 Day  Result Date: 07/27/2019 CLINICAL DATA:  Unresectable liver metastasis. Bladder carcinoma primary. First treatment to the RIGHT hepatic lobe. EXAM: NUCLEAR MEDICINE SPECIAL MED RAD PHYSICS CONS; NUCLEAR MEDICINE RADIO PHARM THERAPY INTRA ARTERIAL; NUCLEAR MEDICINE TREATMENT PROCEDURE; NUCLEAR MEDICINE LIVER SCAN TECHNIQUE: In conjunction with the interventional radiologist a Y-  Microsphere dose was calculated utilizing body surface area formulation. Calculated dose equal 28.2 mCi. Pre therapy MAA liver SPECT scan and CTA were evaluated. Utilizing a microcatheter system, the RIGHT hepatic artery was selected and Y-90 microspheres were delivered in fractionated aliquots. Radiopharmaceutical was delivered by the interventional radiologist and nuclear radiologist. The  patient tolerated procedure well. No adverse effects were noted. Bremsstrahlung planar and SPECT imaging of the abdomen following intrahepatic arterial delivery of Y-90 microsphere was performed. RADIOPHARMACEUTICALS:  83.3 millicuries yttrium 90 microspheres. COMPARISON:  PET-CT 06/17/2019, MRI 06/03/2019 FINDINGS: Y - 90 microspheres therapy as above. First therapy the right hepatic lobe. Bremsstrahlung planar and SPECT imaging of the abdomen following intrahepatic arterial delivery of Y-23mcrosphere demonstrates radioactivity localized to the RIGHT hepatic lobe. No evidence of extrahepatic activity. IMPRESSION: Successful Y - 90 microsphere delivery for treatment of unresectable liver metastasis. First therapy to the RIGHT lobe. Bremssstrahlung scan demonstrates activity localized to RIGHT hepatic lobe with no extrahepatic activity identified. Electronically Signed   By: SSuzy BouchardM.D.   On: 07/27/2019 12:57   Nm Liver Tumor Loc Imflam Spect 1 Day  Result Date: 07/13/2019 CLINICAL DATA:  Bladder carcinoma with liver metastasis. Additional nodal metastasis and spine metastasis. Evaluation for yttrium 90 radiotherapy to the liver. EXAM: ULTRASOUND MISCELLANEOUS SOFT TISSUE; NUCLEAR MEDICINE LIVER SCAN TECHNIQUE: Abdominal images were obtained in multiple projections after intrahepatic arterial injection of radiopharmaceutical. SPECT imaging was performed. Lung shunt calculation was performed. RADIOPHARMACEUTICALS:  5.2 millicuries technetium 99 MAA COMPARISON:  PET-CT 06/17/2019, MRI abdomen 06/03/2019 FINDINGS: The  injected microaggregated albumin localizes within the liver. No evidence of activity within the stomach, duodenum, or bowel. Calculated shunt fraction to the lungs equals 22%. IMPRESSION: 1. No significant extrahepatic radiotracer activity following intrahepatic arterial injection of MAA. 2. Significant lung shunt fraction equals 22% Electronically Signed   By: SSuzy BouchardM.D.   On: 07/13/2019 17:01   Ir Angiogram Visceral Selective  Result Date: 07/27/2019 INDICATION: History of metastatic bladder cancer. Patient presents today for Y 90 radioembolization of the right lobe of the liver Note, pre Y 90 mapping radioembolization demonstrated a right gastric artery however this cannot be successfully coil embolized. As such will also attempt repeat coil embolization of the right gastric artery. EXAM: 1. ULTRASOUND GUIDANCE FOR ARTERIAL ACCESS 2. CELIAC ARTERIOGRAM 3. RIGHT GASTRIC ARTERIOGRAM 4. RIGHT HEPATIC AND RADIOEMBOLIZATION OF THE RIGHT LOBE OF THE LIVER 5. FLUOROSCOPIC GUIDED ADMINISTRATION OF SIR-SPHERES COMPARISON:  Mapping Y 90 radioembolization-07/13/2019; PET-CT-06/17/2019; CT abdomen pelvis-05/24/2019; abdominal MRI - 06/03/2019 MEDICATIONS: Protonix 40 mg IV; Decadron 20 mg IV; Toradol 30 mg IV; Zofran 4 mg IV CONTRAST:  80 cc OMNIPAQUE IOHEXOL 300 MG/ML  SOLN ANESTHESIA/SEDATION: Moderate (conscious) sedation was employed during this procedure. A total of Versed 3 mg and Fentanyl 200 mcg was administered intravenously. Moderate Sedation Time: 70 minutes. The patient's level of consciousness and vital signs were monitored continuously by radiology nursing throughout the procedure under my direct supervision. FLUOROSCOPY TIME:  15 minutes, 30 seconds (1,208 mGy) ACCESS: Right common femoral artery; hemostasis achieved with manual compression. COMPLICATIONS: None immediate. TECHNIQUE: Informed written consent was obtained from the patient after a discussion of the risks, benefits and alternatives  to treatment. Questions regarding the procedure were encouraged and answered. A timeout was performed prior to the initiation of the procedure. The right groin was prepped and draped in the usual sterile fashion, and a sterile drape was applied covering the operative field. Maximum barrier sterile technique with sterile gowns and gloves were used for the procedure. A timeout was performed prior to the initiation of the procedure. Local anesthesia was provided with 1% lidocaine. The right femoral head was marked fluoroscopically. Under direct ultrasound guidance, the right common femoral artery was accessed with a micropuncture kit allowing placement of a of 5-French vascular sheath. An  ultrasound image was saved for documentation purposes. A limited arteriogram performed through the side arm of the sheath confirming appropriate access within the right common femoral artery. Over a Britta Mccreedy wire, a Mickelson catheter was advanced to the level of the inferior thoracic aorta where it was reformed, back bled and flushed. The Mickelson catheter was utilized to select the celiac artery and a celiac arteriogram was performed. Next, with the use of a fathom 14 microwire, a regular Renegade microcatheter was utilized to select the right gastric artery. Despite successful cannulation of the origin the right gastric artery and advancement of a microwire to its mid aspect, the microcatheter would not track beyond the origin the vessel secondary to tortuosity of the vessel's origin as well as it's small caliber. Next, the microcatheter was advanced to the level of the right hepatic artery and a selective right hepatic arteriogram was performed. Appropriate position was confirmed and the Radioembolization was then performed with Yttrium-90 SIR Spheres. Particles were administered via a microcatheter utilizing a completely enclosed system. Monitoring of antegrade flow was performed during and after the administration under  fluoroscopy with use of contrast intermittently. After the administration of the particles, the microcatheter, outer catheter and administration system were then discarded into radiation safety receptacle. At this point, the procedure was terminated. The right common femoral approach vascular sheath was removed and hemostasis was achieved with manual compression. A dressing was placed. The patient tolerated procedure well without immediate postprocedural complication. All participants involved with the procedure were surveyed by the radiation safety officer prior to exiting the fluoroscopy suite. The patient was escorted to the nuclear medicine department for post-treatment imaging. FINDINGS: Selective celiac arteriogram demonstrates complete embolization of the gastroduodenal artery without residual flow. Selective right gastric arteriogram demonstrates patency and tortuosity/narrowing of the origin and proximal aspect. The origin of the right gastric artery was again noted to be adjacent to the dominant left hepatic artery. Despite catheterization of the origin of the vessel and advancement of a microwire to its mid aspect, the microcatheter could not be advanced beyond the origin to allow for safe percutaneous coil embolization. 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 The microcatheter was placed proximal to the origin of the accessory right hepatic artery and Y 90 radioembolization was performed from this location. Intermittent contrast injections during and after the radioembolization confirming preserved antegrade flow without reflux. IMPRESSION: 1. Technically successful radioembolization of the right lobe of the liver with Yttrium-90 microspheres. 2. Attempted though unsuccessful coil embolization of the right gastric artery secondary to tortuosity of the vessel's origin and small caliber  of the vessel. PLAN: - Given inability to successfully percutaneously coil embolized the right gastric artery, Y 90 radioembolization of left lobe of the liver will NOT be pursued given proximity of the right gastric artery to the origin of the left hepatic artery and concern for nontarget embolization in this patient with progressive/advanced intra and extrahepatic metastatic disease. - The patient will be seen in follow-up consultation in the IR clinic in 3-4 weeks following acquisition of postprocedural CMP. - initial surveillance imaging will be performed in 3 months (likely with PET-CT). Electronically Signed   By: Sandi Mariscal M.D.   On: 07/27/2019 13:14   Ir Angiogram Visceral Selective  Result Date: 07/13/2019 INDICATION: History of metastatic bladder cancer. Patient presents today for mapping/pre Y 90 radioembolization. EXAM: 1. ULTRASOUND GUIDANCE FOR ARTERIAL ACCESS 2. CELIAC  AND SUPERIOR MESENTERIC ARTERIOGRAM (1st ORDER) 3. COMMON HEPATIC ARTERIOGRAM (3rd ORDER) 4. GASTRODUODENAL ARTERIOGRAM (3rd ORDER) AND PERCUTANEOUS COIL EMBOLIZATION 5. SELECTIVE LEFT HEPATIC ARTERIOGRAM (3rd ORDER) 6. SELECTIVE RIGHT GASTRIC ARTERIOGRAM (3rd ORDER) 7. SELECTIVE RIGHT HEPATIC ARTERIOGRAM (3rd ORDER) 8. PROPER HEPATIC ARTERIOGRAM (3rd ORDER) AND FLUOROSCOPIC GUIDED ADMINISTRATION OF Tc68mMAA COMPARISON:  CT abdomen pelvis-05/24/2019; PET-CT-06/17/2019 MEDICATIONS: Zosyn 3.375 g IV RADIOPHARMACEUTICALS:  5 mCi Tc97mAA administered via the proper hepatic artery CONTRAST:  140 cc Omnipaque 300 ANESTHESIA/SEDATION: Moderate (conscious) sedation was employed during this procedure. A total of Versed 3 mg and Fentanyl 100 mcg was administered intravenously. Moderate Sedation Time: 88 minutes. The patient's level of consciousness and vital signs were monitored continuously by radiology nursing throughout the procedure under my direct supervision. FLUOROSCOPY TIME:  26 minutes, 42 seconds (3,865 mGy) ACCESS: Right  common femoral artery; hemostasis achieved with manual compression. COMPLICATIONS: None immediate. TECHNIQUE: Informed written consent was obtained from the patient after a discussion of the risks, benefits and alternatives to treatment. Questions regarding the procedure were encouraged and answered. A timeout was performed prior to the initiation of the procedure. The right groin was prepped and draped in the usual sterile fashion, and a sterile drape was applied covering the operative field. Maximum barrier sterile technique with sterile gowns and gloves were used for the procedure. A timeout was performed prior to the initiation of the procedure. Local anesthesia was provided with 1% lidocaine. The right femoral head was marked fluoroscopically. Under ultrasound guidance, the right common femoral artery was accessed with a micropuncture kit after the overlying soft tissues were anesthetized with 1% lidocaine. An ultrasound image was saved for documentation purposes. The micropuncture sheath was exchanged for a 5 Fr[place]ascular sheath over a Bentson wire. A closure arteriogram was performed through the side of the sheath confirming access within the right common femoral artery. Over a Bentson wire, a Mickelson catheter was advanced to the level of the thoracic aorta where it was back bled and flushed. The catheter was then utilized to select the superior mesenteric artery and a superior mesenteric arteriogram with delayed portal venous imaging was performed. The Mickelson catheter was then advanced cranially and utilized to select the celiac artery and a celiac arteriogram was performed. Utilizing a fathom 14 microwire, a regular Renegade microcatheter was advanced into the common hepatic artery and a selective common hepatic arteriogram was performed. The microcatheter was then utilized to select the gastroduodenal artery and a selective gastroduodenal arteriogram was performed. Next, the gastroduodenal artery  was percutaneously coil embolized to near its origin with multiple overlapping 4 mm and 5 mm soft and standard interlock coils. The microcatheter was retracted into the common hepatic artery and a post embolization common hepatic arteriogram was performed. Next, the microcatheter was utilized select both the right and left hepatic arteries and selective arteriograms was performed at both locations. During catheter manipulation, the previously not identified right gastric artery was inadvertently selected. Attempts were made to embolize the origin of the vessel, however this proved unsuccessful secondary to lack of purchase of the microcatheter at the tiny vessel's origin and inability to cannulate the vessel further. As such, the microcatheter was advanced to the level of proper hepatic artery and a selective proper hepatic arteriogram was performed and 5 mCi of technetium 99 MAA was administered for this location. At this point, the procedure was terminated. All wires and catheters and sheaths were removed from the patient. Hemostasis was achieved at the right groin and access  site with manual compression. A dressing was placed. The patient tolerated procedure well without immediate postprocedural complication. The patient was escorted to nuclear medicine department for planar imaging. FINDINGS: Selective superior mesenteric arteriogram is negative for definitive replaced or accessory hepatic arterial supply. Delayed portal venous imaging demonstrates patency of the main, right and left portal veins. Selective celiac arteriogram demonstrates non conventional anatomy with a separate accessory medial segmental branch of the right hepatic artery arising proximal to vessel's normal bifurcation. A right gastric artery was inadvertently cannulated and sub selectively injected, however despite prolonged efforts, the vessel could not be further cannulated to allow for successful percutaneous coil embolization. Selective  right and left hepatic arteriogram demonstrate otherwise conventional branching pattern without definitive evidence of extrahepatic arterial supply. Successful administration of MAA from the level of the proper hepatic artery, IMPRESSION: 1. Successful pre Y-90 arteriogram with percutaneous coil embolization of the gastroduodenal artery. 2. Successful right gastric arteriogram, however the vessel could not be percutaneously coil embolized secondary to tortuosity and small caliber of the vessel's origin. 3. Successful administration of 5 mCi of technetium 7mMAA via the proper hepatic artery. Awaiting results of nuclear medicine liver scan. PLAN: Pending the results of the nuclear medicine liver scan, the patient will return for Y 90 radioembolization of the right lobe of the liver. At the time of the planned right lobe radioembolization additional attempts will be made to coil the right gastric artery. Electronically Signed   By: JSandi MariscalM.D.   On: 07/13/2019 12:22   Ir Angiogram Selective Each Additional Vessel  Result Date: 07/27/2019 INDICATION: History of metastatic bladder cancer. Patient presents today for Y 90 radioembolization of the right lobe of the liver Note, pre Y 90 mapping radioembolization demonstrated a right gastric artery however this cannot be successfully coil embolized. As such will also attempt repeat coil embolization of the right gastric artery. EXAM: 1. ULTRASOUND GUIDANCE FOR ARTERIAL ACCESS 2. CELIAC ARTERIOGRAM 3. RIGHT GASTRIC ARTERIOGRAM 4. RIGHT HEPATIC AND RADIOEMBOLIZATION OF THE RIGHT LOBE OF THE LIVER 5. FLUOROSCOPIC GUIDED ADMINISTRATION OF SIR-SPHERES COMPARISON:  Mapping Y 90 radioembolization-07/13/2019; PET-CT-06/17/2019; CT abdomen pelvis-05/24/2019; abdominal MRI - 06/03/2019 MEDICATIONS: Protonix 40 mg IV; Decadron 20 mg IV; Toradol 30 mg IV; Zofran 4 mg IV CONTRAST:  80 cc OMNIPAQUE IOHEXOL 300 MG/ML  SOLN ANESTHESIA/SEDATION: Moderate (conscious) sedation was  employed during this procedure. A total of Versed 3 mg and Fentanyl 200 mcg was administered intravenously. Moderate Sedation Time: 70 minutes. The patient's level of consciousness and vital signs were monitored continuously by radiology nursing throughout the procedure under my direct supervision. FLUOROSCOPY TIME:  15 minutes, 30 seconds (1,208 mGy) ACCESS: Right common femoral artery; hemostasis achieved with manual compression. COMPLICATIONS: None immediate. TECHNIQUE: Informed written consent was obtained from the patient after a discussion of the risks, benefits and alternatives to treatment. Questions regarding the procedure were encouraged and answered. A timeout was performed prior to the initiation of the procedure. The right groin was prepped and draped in the usual sterile fashion, and a sterile drape was applied covering the operative field. Maximum barrier sterile technique with sterile gowns and gloves were used for the procedure. A timeout was performed prior to the initiation of the procedure. Local anesthesia was provided with 1% lidocaine. The right femoral head was marked fluoroscopically. Under direct ultrasound guidance, the right common femoral artery was accessed with a micropuncture kit allowing placement of a of 5-French vascular sheath. An ultrasound image was saved for documentation purposes. A  limited arteriogram performed through the side arm of the sheath confirming appropriate access within the right common femoral artery. Over a Britta Mccreedy wire, a Mickelson catheter was advanced to the level of the inferior thoracic aorta where it was reformed, back bled and flushed. The Mickelson catheter was utilized to select the celiac artery and a celiac arteriogram was performed. Next, with the use of a fathom 14 microwire, a regular Renegade microcatheter was utilized to select the right gastric artery. Despite successful cannulation of the origin the right gastric artery and advancement of a  microwire to its mid aspect, the microcatheter would not track beyond the origin the vessel secondary to tortuosity of the vessel's origin as well as it's small caliber. Next, the microcatheter was advanced to the level of the right hepatic artery and a selective right hepatic arteriogram was performed. Appropriate position was confirmed and the Radioembolization was then performed with Yttrium-90 SIR Spheres. Particles were administered via a microcatheter utilizing a completely enclosed system. Monitoring of antegrade flow was performed during and after the administration under fluoroscopy with use of contrast intermittently. After the administration of the particles, the microcatheter, outer catheter and administration system were then discarded into radiation safety receptacle. At this point, the procedure was terminated. The right common femoral approach vascular sheath was removed and hemostasis was achieved with manual compression. A dressing was placed. The patient tolerated procedure well without immediate postprocedural complication. All participants involved with the procedure were surveyed by the radiation safety officer prior to exiting the fluoroscopy suite. The patient was escorted to the nuclear medicine department for post-treatment imaging. FINDINGS: Selective celiac arteriogram demonstrates complete embolization of the gastroduodenal artery without residual flow. Selective right gastric arteriogram demonstrates patency and tortuosity/narrowing of the origin and proximal aspect. The origin of the right gastric artery was again noted to be adjacent to the dominant left hepatic artery. Despite catheterization of the origin of the vessel and advancement of a microwire to its mid aspect, the microcatheter could not be advanced beyond the origin to allow for safe percutaneous coil embolization. Selective right hepatic arteriogram demonstrates an accessory anterior division of the right hepatic artery  arising proximal to the mid division of the vessel. The microcatheter was placed proximal to the origin of the accessory right hepatic artery and Y 90 radioembolization was performed from this location. Intermittent contrast injections during and after the radioembolization confirming preserved antegrade flow without reflux. IMPRESSION: 1. Technically successful radioembolization of the right lobe of the liver with Yttrium-90 microspheres. 2. Attempted though unsuccessful coil embolization of the right gastric artery secondary to tortuosity of the vessel's origin and small caliber of the vessel. PLAN: - Given inability to successfully percutaneously coil embolized the right gastric artery, Y 90 radioembolization of left lobe of the liver will NOT be pursued given proximity of the right gastric artery to the origin of the left hepatic artery and concern for nontarget embolization in this patient with progressive/advanced intra and extrahepatic metastatic disease. - The patient will be seen in follow-up consultation in the IR clinic in 3-4 weeks following acquisition of postprocedural CMP. - initial surveillance imaging will be performed in 3 months (likely with PET-CT). Electronically Signed   By: Sandi Mariscal M.D.   On: 07/27/2019 13:14   Ir Angiogram Selective Each Additional Vessel  Result Date: 07/27/2019 INDICATION: History of metastatic bladder cancer. Patient presents today for Y 90 radioembolization of the right lobe of the liver Note, pre Y 90 mapping radioembolization demonstrated a right  gastric artery however this cannot be successfully coil embolized. As such will also attempt repeat coil embolization of the right gastric artery. EXAM: 1. ULTRASOUND GUIDANCE FOR ARTERIAL ACCESS 2. CELIAC ARTERIOGRAM 3. RIGHT GASTRIC ARTERIOGRAM 4. RIGHT HEPATIC AND RADIOEMBOLIZATION OF THE RIGHT LOBE OF THE LIVER 5. FLUOROSCOPIC GUIDED ADMINISTRATION OF SIR-SPHERES COMPARISON:  Mapping Y 90  radioembolization-07/13/2019; PET-CT-06/17/2019; CT abdomen pelvis-05/24/2019; abdominal MRI - 06/03/2019 MEDICATIONS: Protonix 40 mg IV; Decadron 20 mg IV; Toradol 30 mg IV; Zofran 4 mg IV CONTRAST:  80 cc OMNIPAQUE IOHEXOL 300 MG/ML  SOLN ANESTHESIA/SEDATION: Moderate (conscious) sedation was employed during this procedure. A total of Versed 3 mg and Fentanyl 200 mcg was administered intravenously. Moderate Sedation Time: 70 minutes. The patient's level of consciousness and vital signs were monitored continuously by radiology nursing throughout the procedure under my direct supervision. FLUOROSCOPY TIME:  15 minutes, 30 seconds (1,208 mGy) ACCESS: Right common femoral artery; hemostasis achieved with manual compression. COMPLICATIONS: None immediate. TECHNIQUE: Informed written consent was obtained from the patient after a discussion of the risks, benefits and alternatives to treatment. Questions regarding the procedure were encouraged and answered. A timeout was performed prior to the initiation of the procedure. The right groin was prepped and draped in the usual sterile fashion, and a sterile drape was applied covering the operative field. Maximum barrier sterile technique with sterile gowns and gloves were used for the procedure. A timeout was performed prior to the initiation of the procedure. Local anesthesia was provided with 1% lidocaine. The right femoral head was marked fluoroscopically. Under direct ultrasound guidance, the right common femoral artery was accessed with a micropuncture kit allowing placement of a of 5-French vascular sheath. An ultrasound image was saved for documentation purposes. A limited arteriogram performed through the side arm of the sheath confirming appropriate access within the right common femoral artery. Over a Britta Mccreedy wire, a Mickelson catheter was advanced to the level of the inferior thoracic aorta where it was reformed, back bled and flushed. The Mickelson catheter was  utilized to select the celiac artery and a celiac arteriogram was performed. Next, with the use of a fathom 14 microwire, a regular Renegade microcatheter was utilized to select the right gastric artery. Despite successful cannulation of the origin the right gastric artery and advancement of a microwire to its mid aspect, the microcatheter would not track beyond the origin the vessel secondary to tortuosity of the vessel's origin as well as it's small caliber. Next, the microcatheter was advanced to the level of the right hepatic artery and a selective right hepatic arteriogram was performed. Appropriate position was confirmed and the Radioembolization was then performed with Yttrium-90 SIR Spheres. Particles were administered via a microcatheter utilizing a completely enclosed system. Monitoring of antegrade flow was performed during and after the administration under fluoroscopy with use of contrast intermittently. After the administration of the particles, the microcatheter, outer catheter and administration system were then discarded into radiation safety receptacle. At this point, the procedure was terminated. The right common femoral approach vascular sheath was removed and hemostasis was achieved with manual compression. A dressing was placed. The patient tolerated procedure well without immediate postprocedural complication. All participants involved with the procedure were surveyed by the radiation safety officer prior to exiting the fluoroscopy suite. The patient was escorted to the nuclear medicine department for post-treatment imaging. FINDINGS: Selective celiac arteriogram demonstrates complete embolization of the gastroduodenal artery without residual flow. Selective right gastric arteriogram demonstrates patency and tortuosity/narrowing of the origin and proximal  aspect. The origin of the right gastric artery was again noted to be adjacent to the dominant left hepatic artery. Despite catheterization  of the origin of the vessel and advancement of a microwire to its mid aspect, the microcatheter could not be advanced beyond the origin to allow for safe percutaneous coil embolization. 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 The microcatheter was placed proximal to the origin of the accessory right hepatic artery and Y 90 radioembolization was performed from this location. Intermittent contrast injections during and after the radioembolization confirming preserved antegrade flow without reflux. IMPRESSION: 1. Technically successful radioembolization of the right lobe of the liver with Yttrium-90 microspheres. 2. Attempted though unsuccessful coil embolization of the right gastric artery secondary to tortuosity of the vessel's origin and small caliber of the vessel. PLAN: - Given inability to successfully percutaneously coil embolized the right gastric artery, Y 90 radioembolization of left lobe of the liver will NOT be pursued given proximity of the right gastric artery to the origin of the left hepatic artery and concern for nontarget embolization in this patient with progressive/advanced intra and extrahepatic metastatic disease. - The patient will be seen in follow-up consultation in the IR clinic in 3-4 weeks following acquisition of postprocedural CMP. - initial surveillance imaging will be performed in 3 months (likely with PET-CT). Electronically Signed   By: Sandi Mariscal M.D.   On: 07/27/2019 13:14   Ir Angiogram Selective Each Additional Vessel  Result Date: 07/13/2019 INDICATION: History of metastatic bladder cancer. Patient presents today for mapping/pre Y 90 radioembolization. EXAM: 1. ULTRASOUND GUIDANCE FOR ARTERIAL ACCESS 2. CELIAC AND SUPERIOR MESENTERIC ARTERIOGRAM (1st ORDER) 3. COMMON HEPATIC ARTERIOGRAM (3rd ORDER) 4. GASTRODUODENAL ARTERIOGRAM (3rd ORDER) AND PERCUTANEOUS  COIL EMBOLIZATION 5. SELECTIVE LEFT HEPATIC ARTERIOGRAM (3rd ORDER) 6. SELECTIVE RIGHT GASTRIC ARTERIOGRAM (3rd ORDER) 7. SELECTIVE RIGHT HEPATIC ARTERIOGRAM (3rd ORDER) 8. PROPER HEPATIC ARTERIOGRAM (3rd ORDER) AND FLUOROSCOPIC GUIDED ADMINISTRATION OF Tc84mMAA COMPARISON:  CT abdomen pelvis-05/24/2019; PET-CT-06/17/2019 MEDICATIONS: Zosyn 3.375 g IV RADIOPHARMACEUTICALS:  5 mCi [id number] administered via the proper hepatic artery CONTRAST:  140 cc Omnipaque 300 ANESTHESIA/SEDATION: Moderate (conscious) sedation was employed during this procedure. A total of Versed 3 mg and Fentanyl 100 mcg was administered intravenously. Moderate Sedation Time: 88 minutes. The patient's level of consciousness and vital signs were monitored continuously by radiology nursing throughout the procedure under my direct supervision. FLUOROSCOPY TIME:  26 minutes, 42 seconds (3,865 mGy) ACCESS: Right common femoral artery; hemostasis achieved with manual compression. COMPLICATIONS: None immediate. TECHNIQUE: Informed written consent was obtained from the patient after a discussion of the risks, benefits and alternatives to treatment. Questions regarding the procedure were encouraged and answered. A timeout was performed prior to the initiation of the procedure. The right groin was prepped and draped in the usual sterile fashion, and a sterile drape was applied covering the operative field. Maximum barrier sterile technique with sterile gowns and gloves were used for the procedure. A timeout was performed prior to the initiation of the procedure. Local anesthesia was provided with 1% lidocaine. The right femoral head was marked fluoroscopically. Under ultrasound guidance, the right common femoral artery was accessed with a micropuncture kit after the overlying soft tissues were anesthetized with 1% lidocaine. An ultrasound image was saved for documentation purposes. The micropuncture sheath was exchanged for a 5 Fr[place] sheath  over a Bentson wire. A closure arteriogram was performed  through the side of the sheath confirming access within the right common femoral artery. Over a Bentson wire, a Mickelson catheter was advanced to the level of the thoracic aorta where it was back bled and flushed. The catheter was then utilized to select the superior mesenteric artery and a superior mesenteric arteriogram with delayed portal venous imaging was performed. The Mickelson catheter was then advanced cranially and utilized to select the celiac artery and a celiac arteriogram was performed. Utilizing a fathom 14 microwire, a regular Renegade microcatheter was advanced into the common hepatic artery and a selective common hepatic arteriogram was performed. The microcatheter was then utilized to select the gastroduodenal artery and a selective gastroduodenal arteriogram was performed. Next, the gastroduodenal artery was percutaneously coil embolized to near its origin with multiple overlapping 4 mm and 5 mm soft and standard interlock coils. The microcatheter was retracted into the common hepatic artery and a post embolization common hepatic arteriogram was performed. Next, the microcatheter was utilized select both the right and left hepatic arteries and selective arteriograms was performed at both locations. During catheter manipulation, the previously not identified right gastric artery was inadvertently selected. Attempts were made to embolize the origin of the vessel, however this proved unsuccessful secondary to lack of purchase of the microcatheter at the tiny vessel's origin and inability to cannulate the vessel further. As such, the microcatheter was advanced to the level of proper hepatic artery and a selective proper hepatic arteriogram was performed and 5 mCi of technetium 99 MAA was administered for this location. At this point, the procedure was terminated. All wires and catheters and sheaths were removed from the patient. Hemostasis was  achieved at the right groin and access site with manual compression. A dressing was placed. The patient tolerated procedure well without immediate postprocedural complication. The patient was escorted to nuclear medicine department for planar imaging. FINDINGS: Selective superior mesenteric arteriogram is negative for definitive replaced or accessory hepatic arterial supply. Delayed portal venous imaging demonstrates patency of the main, right and left portal veins. Selective celiac arteriogram demonstrates non conventional anatomy with a separate accessory medial segmental branch of the right hepatic artery arising proximal to vessel's normal bifurcation. A right gastric artery was inadvertently cannulated and sub selectively injected, however despite prolonged efforts, the vessel could not be further cannulated to allow for successful percutaneous coil embolization. Selective right and left hepatic arteriogram demonstrate otherwise conventional branching pattern without definitive evidence of extrahepatic arterial supply. Successful administration of MAA from the level of the proper hepatic artery, IMPRESSION: 1. Successful pre Y-90 arteriogram with percutaneous coil embolization of the gastroduodenal artery. 2. Successful right gastric arteriogram, however the vessel could not be percutaneously coil embolized secondary to tortuosity and small caliber of the vessel's origin. 3. Successful administration of 5 mCi of technetium 60mMAA via the proper hepatic artery. Awaiting results of nuclear medicine liver scan. PLAN: Pending the results of the nuclear medicine liver scan, the patient will return for Y 90 radioembolization of the right lobe of the liver. At the time of the planned right lobe radioembolization additional attempts will be made to coil the right gastric artery. Electronically Signed   By: JSandi MariscalM.D.   On: 07/13/2019 12:22   Ir Angiogram Selective Each Additional Vessel  Result Date:  07/13/2019 INDICATION: History of metastatic bladder cancer. Patient presents today for mapping/pre Y 90 radioembolization. EXAM: 1. ULTRASOUND GUIDANCE FOR ARTERIAL ACCESS 2. CELIAC AND SUPERIOR MESENTERIC ARTERIOGRAM (1st ORDER) 3. COMMON HEPATIC ARTERIOGRAM (3rd  ORDER) 4. GASTRODUODENAL ARTERIOGRAM (3rd ORDER) AND PERCUTANEOUS COIL EMBOLIZATION 5. SELECTIVE LEFT HEPATIC ARTERIOGRAM (3rd ORDER) 6. SELECTIVE RIGHT GASTRIC ARTERIOGRAM (3rd ORDER) 7. SELECTIVE RIGHT HEPATIC ARTERIOGRAM (3rd ORDER) 8. PROPER HEPATIC ARTERIOGRAM (3rd ORDER) AND FLUOROSCOPIC GUIDED ADMINISTRATION OF Tc79mMAA COMPARISON:  CT abdomen pelvis-05/24/2019; PET-CT-06/17/2019 MEDICATIONS: Zosyn 3.375 g IV RADIOPHARMACEUTICALS:  5 mCi [id number] administered via the proper hepatic artery CONTRAST:  140 cc Omnipaque 300 ANESTHESIA/SEDATION: Moderate (conscious) sedation was employed during this procedure. A total of Versed 3 mg and Fentanyl 100 mcg was administered intravenously. Moderate Sedation Time: 88 minutes. The patient's level of consciousness and vital signs were monitored continuously by radiology nursing throughout the procedure under my direct supervision. FLUOROSCOPY TIME:  26 minutes, 42 seconds (3,865 mGy) ACCESS: Right common femoral artery; hemostasis achieved with manual compression. COMPLICATIONS: None immediate. TECHNIQUE: Informed written consent was obtained from the patient after a discussion of the risks, benefits and alternatives to treatment. Questions regarding the procedure were encouraged and answered. A timeout was performed prior to the initiation of the procedure. The right groin was prepped and draped in the usual sterile fashion, and a sterile drape was applied covering the operative field. Maximum barrier sterile technique with sterile gowns and gloves were used for the procedure. A timeout was performed prior to the initiation of the procedure. Local anesthesia was provided with 1% lidocaine. The right femoral  head was marked fluoroscopically. Under ultrasound guidance, the right common femoral artery was accessed with a micropuncture kit after the overlying soft tissues were anesthetized with 1% lidocaine. An ultrasound image was saved for documentation purposes. The micropuncture sheath was exchanged for a 5 Fr[place] sheath over a Bentson wire. A closure arteriogram was performed through the side of the sheath confirming access within the right common femoral artery. Over a Bentson wire, a Mickelson catheter was advanced to the level of the thoracic aorta where it was back bled and flushed. The catheter was then utilized to select the superior mesenteric artery and a superior mesenteric arteriogram with delayed portal venous imaging was performed. The Mickelson catheter was then advanced cranially and utilized to select the celiac artery and a celiac arteriogram was performed. Utilizing a fathom 14 microwire, a regular Renegade microcatheter was advanced into the common hepatic artery and a selective common hepatic arteriogram was performed. The microcatheter was then utilized to select the gastroduodenal artery and a selective gastroduodenal arteriogram was performed. Next, the gastroduodenal artery was percutaneously coil embolized to near its origin with multiple overlapping 4 mm and 5 mm soft and standard interlock coils. The microcatheter was retracted into the common hepatic artery and a post embolization common hepatic arteriogram was performed. Next, the microcatheter was utilized select both the right and left hepatic arteries and selective arteriograms was performed at both locations. During catheter manipulation, the previously not identified right gastric artery was inadvertently selected. Attempts were made to embolize the origin of the vessel, however this proved unsuccessful secondary to lack of purchase of the microcatheter at the tiny vessel's origin and inability to cannulate the vessel further.  As such, the microcatheter was advanced to the level of proper hepatic artery and a selective proper hepatic arteriogram was performed and 5 mCi of technetium 99 MAA was administered for this location. At this point, the procedure was terminated. All wires and catheters and sheaths were removed from the patient. Hemostasis was achieved at the right groin and access site with manual compression. A dressing was placed. The patient tolerated  procedure well without immediate postprocedural complication. The patient was escorted to nuclear medicine department for planar imaging. FINDINGS: Selective superior mesenteric arteriogram is negative for definitive replaced or accessory hepatic arterial supply. Delayed portal venous imaging demonstrates patency of the main, right and left portal veins. Selective celiac arteriogram demonstrates non conventional anatomy with a separate accessory medial segmental branch of the right hepatic artery arising proximal to vessel's normal bifurcation. A right gastric artery was inadvertently cannulated and sub selectively injected, however despite prolonged efforts, the vessel could not be further cannulated to allow for successful percutaneous coil embolization. Selective right and left hepatic arteriogram demonstrate otherwise conventional branching pattern without definitive evidence of extrahepatic arterial supply. Successful administration of MAA from the level of the proper hepatic artery, IMPRESSION: 1. Successful pre Y-90 arteriogram with percutaneous coil embolization of the gastroduodenal artery. 2. Successful right gastric arteriogram, however the vessel could not be percutaneously coil embolized secondary to tortuosity and small caliber of the vessel's origin. 3. Successful administration of 5 mCi of technetium 23mMAA via the proper hepatic artery. Awaiting results of nuclear medicine liver scan. PLAN: Pending the results of the nuclear medicine liver scan, the patient will  return for Y 90 radioembolization of the right lobe of the liver. At the time of the planned right lobe radioembolization additional attempts will be made to coil the right gastric artery. Electronically Signed   By: JSandi MariscalM.D.   On: 07/13/2019 12:22   Ir Angiogram Selective Each Additional Vessel  Result Date: 07/13/2019 INDICATION: History of metastatic bladder cancer. Patient presents today for mapping/pre Y 90 radioembolization. EXAM: 1. ULTRASOUND GUIDANCE FOR ARTERIAL ACCESS 2. CELIAC AND SUPERIOR MESENTERIC ARTERIOGRAM (1st ORDER) 3. COMMON HEPATIC ARTERIOGRAM (3rd ORDER) 4. GASTRODUODENAL ARTERIOGRAM (3rd ORDER) AND PERCUTANEOUS COIL EMBOLIZATION 5. SELECTIVE LEFT HEPATIC ARTERIOGRAM (3rd ORDER) 6. SELECTIVE RIGHT GASTRIC ARTERIOGRAM (3rd ORDER) 7. SELECTIVE RIGHT HEPATIC ARTERIOGRAM (3rd ORDER) 8. PROPER HEPATIC ARTERIOGRAM (3rd ORDER) AND FLUOROSCOPIC GUIDED ADMINISTRATION OF Tc9104mAA COMPARISON:  CT abdomen pelvis-05/24/2019; PET-CT-06/17/2019 MEDICATIONS: Zosyn 3.375 g IV RADIOPHARMACEUTICALS:  5 mCi Tc9952mA administered via the proper hepatic artery CONTRAST:  140 cc Omnipaque 300 ANESTHESIA/SEDATION: Moderate (conscious) sedation was employed during this procedure. A total of Versed 3 mg and Fentanyl 100 mcg was administered intravenously. Moderate Sedation Time: 88 minutes. The patient's level of consciousness and vital signs were monitored continuously by radiology nursing throughout the procedure under my direct supervision. FLUOROSCOPY TIME:  26 minutes, 42 seconds (3,865 mGy) ACCESS: Right common femoral artery; hemostasis achieved with manual compression. COMPLICATIONS: None immediate. TECHNIQUE: Informed written consent was obtained from the patient after a discussion of the risks, benefits and alternatives to treatment. Questions regarding the procedure were encouraged and answered. A timeout was performed prior to the initiation of the procedure. The right groin was prepped and  draped in the usual sterile fashion, and a sterile drape was applied covering the operative field. Maximum barrier sterile technique with sterile gowns and gloves were used for the procedure. A timeout was performed prior to the initiation of the procedure. Local anesthesia was provided with 1% lidocaine. The right femoral head was marked fluoroscopically. Under ultrasound guidance, the right common femoral artery was accessed with a micropuncture kit after the overlying soft tissues were anesthetized with 1% lidocaine. An ultrasound image was saved for documentation purposes. The micropuncture sheath was exchanged for a 5 FrePakistanscular sheath over a Bentson wire. A closure arteriogram was performed through the side of the sheath confirming access  within the right common femoral artery. Over a Bentson wire, a Mickelson catheter was advanced to the level of the thoracic aorta where it was back bled and flushed. The catheter was then utilized to select the superior mesenteric artery and a superior mesenteric arteriogram with delayed portal venous imaging was performed. The Mickelson catheter was then advanced cranially and utilized to select the celiac artery and a celiac arteriogram was performed. Utilizing a fathom 14 microwire, a regular Renegade microcatheter was advanced into the common hepatic artery and a selective common hepatic arteriogram was performed. The microcatheter was then utilized to select the gastroduodenal artery and a selective gastroduodenal arteriogram was performed. Next, the gastroduodenal artery was percutaneously coil embolized to near its origin with multiple overlapping 4 mm and 5 mm soft and standard interlock coils. The microcatheter was retracted into the common hepatic artery and a post embolization common hepatic arteriogram was performed. Next, the microcatheter was utilized select both the right and left hepatic arteries and selective arteriograms was performed at both locations.  During catheter manipulation, the previously not identified right gastric artery was inadvertently selected. Attempts were made to embolize the origin of the vessel, however this proved unsuccessful secondary to lack of purchase of the microcatheter at the tiny vessel's origin and inability to cannulate the vessel further. As such, the microcatheter was advanced to the level of proper hepatic artery and a selective proper hepatic arteriogram was performed and 5 mCi of technetium 99 MAA was administered for this location. At this point, the procedure was terminated. All wires and catheters and sheaths were removed from the patient. Hemostasis was achieved at the right groin and access site with manual compression. A dressing was placed. The patient tolerated procedure well without immediate postprocedural complication. The patient was escorted to nuclear medicine department for planar imaging. FINDINGS: Selective superior mesenteric arteriogram is negative for definitive replaced or accessory hepatic arterial supply. Delayed portal venous imaging demonstrates patency of the main, right and left portal veins. Selective celiac arteriogram demonstrates non conventional anatomy with a separate accessory medial segmental branch of the right hepatic artery arising proximal to vessel's normal bifurcation. A right gastric artery was inadvertently cannulated and sub selectively injected, however despite prolonged efforts, the vessel could not be further cannulated to allow for successful percutaneous coil embolization. Selective right and left hepatic arteriogram demonstrate otherwise conventional branching pattern without definitive evidence of extrahepatic arterial supply. Successful administration of MAA from the level of the proper hepatic artery, IMPRESSION: 1. Successful pre Y-90 arteriogram with percutaneous coil embolization of the gastroduodenal artery. 2. Successful right gastric arteriogram, however the vessel  could not be percutaneously coil embolized secondary to tortuosity and small caliber of the vessel's origin. 3. Successful administration of 5 mCi of technetium 29mMAA via the proper hepatic artery. Awaiting results of nuclear medicine liver scan. PLAN: Pending the results of the nuclear medicine liver scan, the patient will return for Y 90 radioembolization of the right lobe of the liver. At the time of the planned right lobe radioembolization additional attempts will be made to coil the right gastric artery. Electronically Signed   By: JSandi MariscalM.D.   On: 07/13/2019 12:22   Ir Angiogram Selective Each Additional Vessel  Result Date: 07/13/2019 INDICATION: History of metastatic bladder cancer. Patient presents today for mapping/pre Y 90 radioembolization. EXAM: 1. ULTRASOUND GUIDANCE FOR ARTERIAL ACCESS 2. CELIAC AND SUPERIOR MESENTERIC ARTERIOGRAM (1st ORDER) 3. COMMON HEPATIC ARTERIOGRAM (3rd ORDER) 4. GASTRODUODENAL ARTERIOGRAM (3rd ORDER) AND PERCUTANEOUS  COIL EMBOLIZATION 5. SELECTIVE LEFT HEPATIC ARTERIOGRAM (3rd ORDER) 6. SELECTIVE RIGHT GASTRIC ARTERIOGRAM (3rd ORDER) 7. SELECTIVE RIGHT HEPATIC ARTERIOGRAM (3rd ORDER) 8. PROPER HEPATIC ARTERIOGRAM (3rd ORDER) AND FLUOROSCOPIC GUIDED ADMINISTRATION OF Tc52mMAA COMPARISON:  CT abdomen pelvis-05/24/2019; PET-CT-06/17/2019 MEDICATIONS: Zosyn 3.375 g IV RADIOPHARMACEUTICALS:  5 mCi Tc928mAA administered via the proper hepatic artery CONTRAST:  140 cc Omnipaque 300 ANESTHESIA/SEDATION: Moderate (conscious) sedation was employed during this procedure. A total of Versed 3 mg and Fentanyl 100 mcg was administered intravenously. Moderate Sedation Time: 88 minutes. The patient's level of consciousness and vital signs were monitored continuously by radiology nursing throughout the procedure under my direct supervision. FLUOROSCOPY TIME:  26 minutes, 42 seconds (3,865 mGy) ACCESS: Right common femoral artery; hemostasis achieved with manual compression.  COMPLICATIONS: None immediate. TECHNIQUE: Informed written consent was obtained from the patient after a discussion of the risks, benefits and alternatives to treatment. Questions regarding the procedure were encouraged and answered. A timeout was performed prior to the initiation of the procedure. The right groin was prepped and draped in the usual sterile fashion, and a sterile drape was applied covering the operative field. Maximum barrier sterile technique with sterile gowns and gloves were used for the procedure. A timeout was performed prior to the initiation of the procedure. Local anesthesia was provided with 1% lidocaine. The right femoral head was marked fluoroscopically. Under ultrasound guidance, the right common femoral artery was accessed with a micropuncture kit after the overlying soft tissues were anesthetized with 1% lidocaine. An ultrasound image was saved for documentation purposes. The micropuncture sheath was exchanged for a 5 Fr[place]ascular sheath over a Bentson wire. A closure arteriogram was performed through the side of the sheath confirming access within the right common femoral artery. Over a Bentson wire, a Mickelson catheter was advanced to the level of the thoracic aorta where it was back bled and flushed. The catheter was then utilized to select the superior mesenteric artery and a superior mesenteric arteriogram with delayed portal venous imaging was performed. The Mickelson catheter was then advanced cranially and utilized to select the celiac artery and a celiac arteriogram was performed. Utilizing a fathom 14 microwire, a regular Renegade microcatheter was advanced into the common hepatic artery and a selective common hepatic arteriogram was performed. The microcatheter was then utilized to select the gastroduodenal artery and a selective gastroduodenal arteriogram was performed. Next, the gastroduodenal artery was percutaneously coil embolized to near its origin with multiple  overlapping 4 mm and 5 mm soft and standard interlock coils. The microcatheter was retracted into the common hepatic artery and a post embolization common hepatic arteriogram was performed. Next, the microcatheter was utilized select both the right and left hepatic arteries and selective arteriograms was performed at both locations. During catheter manipulation, the previously not identified right gastric artery was inadvertently selected. Attempts were made to embolize the origin of the vessel, however this proved unsuccessful secondary to lack of purchase of the microcatheter at the tiny vessel's origin and inability to cannulate the vessel further. As such, the microcatheter was advanced to the level of proper hepatic artery and a selective proper hepatic arteriogram was performed and 5 mCi of technetium 99 MAA was administered for this location. At this point, the procedure was terminated. All wires and catheters and sheaths were removed from the patient. Hemostasis was achieved at the right groin and access site with manual compression. A dressing was placed. The patient tolerated procedure well without immediate postprocedural complication. The patient  was escorted to nuclear medicine department for planar imaging. FINDINGS: Selective superior mesenteric arteriogram is negative for definitive replaced or accessory hepatic arterial supply. Delayed portal venous imaging demonstrates patency of the main, right and left portal veins. Selective celiac arteriogram demonstrates non conventional anatomy with a separate accessory medial segmental branch of the right hepatic artery arising proximal to vessel's normal bifurcation. A right gastric artery was inadvertently cannulated and sub selectively injected, however despite prolonged efforts, the vessel could not be further cannulated to allow for successful percutaneous coil embolization. Selective right and left hepatic arteriogram demonstrate otherwise conventional  branching pattern without definitive evidence of extrahepatic arterial supply. Successful administration of MAA from the level of the proper hepatic artery, IMPRESSION: 1. Successful pre Y-90 arteriogram with percutaneous coil embolization of the gastroduodenal artery. 2. Successful right gastric arteriogram, however the vessel could not be percutaneously coil embolized secondary to tortuosity and small caliber of the vessel's origin. 3. Successful administration of 5 mCi of technetium 31mMAA via the proper hepatic artery. Awaiting results of nuclear medicine liver scan. PLAN: Pending the results of the nuclear medicine liver scan, the patient will return for Y 90 radioembolization of the right lobe of the liver. At the time of the planned right lobe radioembolization additional attempts will be made to coil the right gastric artery. Electronically Signed   By: JSandi MariscalM.D.   On: 07/13/2019 12:22   Ir Angiogram Selective Each Additional Vessel  Result Date: 07/13/2019 INDICATION: History of metastatic bladder cancer. Patient presents today for mapping/pre Y 90 radioembolization. EXAM: 1. ULTRASOUND GUIDANCE FOR ARTERIAL ACCESS 2. CELIAC AND SUPERIOR MESENTERIC ARTERIOGRAM (1st ORDER) 3. COMMON HEPATIC ARTERIOGRAM (3rd ORDER) 4. GASTRODUODENAL ARTERIOGRAM (3rd ORDER) AND PERCUTANEOUS COIL EMBOLIZATION 5. SELECTIVE LEFT HEPATIC ARTERIOGRAM (3rd ORDER) 6. SELECTIVE RIGHT GASTRIC ARTERIOGRAM (3rd ORDER) 7. SELECTIVE RIGHT HEPATIC ARTERIOGRAM (3rd ORDER) 8. PROPER HEPATIC ARTERIOGRAM (3rd ORDER) AND FLUOROSCOPIC GUIDED ADMINISTRATION OF Tc976mAA COMPARISON:  CT abdomen pelvis-05/24/2019; PET-CT-06/17/2019 MEDICATIONS: Zosyn 3.375 g IV RADIOPHARMACEUTICALS:  5 mCi Tc9958mA administered via the proper hepatic artery CONTRAST:  140 cc Omnipaque 300 ANESTHESIA/SEDATION: Moderate (conscious) sedation was employed during this procedure. A total of Versed 3 mg and Fentanyl 100 mcg was administered intravenously.  Moderate Sedation Time: 88 minutes. The patient's level of consciousness and vital signs were monitored continuously by radiology nursing throughout the procedure under my direct supervision. FLUOROSCOPY TIME:  26 minutes, 42 seconds (3,865 mGy) ACCESS: Right common femoral artery; hemostasis achieved with manual compression. COMPLICATIONS: None immediate. TECHNIQUE: Informed written consent was obtained from the patient after a discussion of the risks, benefits and alternatives to treatment. Questions regarding the procedure were encouraged and answered. A timeout was performed prior to the initiation of the procedure. The right groin was prepped and draped in the usual sterile fashion, and a sterile drape was applied covering the operative field. Maximum barrier sterile technique with sterile gowns and gloves were used for the procedure. A timeout was performed prior to the initiation of the procedure. Local anesthesia was provided with 1% lidocaine. The right femoral head was marked fluoroscopically. Under ultrasound guidance, the right common femoral artery was accessed with a micropuncture kit after the overlying soft tissues were anesthetized with 1% lidocaine. An ultrasound image was saved for documentation purposes. The micropuncture sheath was exchanged for a 5 FrePakistanscular sheath over a Bentson wire. A closure arteriogram was performed through the side of the sheath confirming access within the right common femoral artery. Over a  Bentson wire, a Mickelson catheter was advanced to the level of the thoracic aorta where it was back bled and flushed. The catheter was then utilized to select the superior mesenteric artery and a superior mesenteric arteriogram with delayed portal venous imaging was performed. The Mickelson catheter was then advanced cranially and utilized to select the celiac artery and a celiac arteriogram was performed. Utilizing a fathom 14 microwire, a regular Renegade microcatheter was  advanced into the common hepatic artery and a selective common hepatic arteriogram was performed. The microcatheter was then utilized to select the gastroduodenal artery and a selective gastroduodenal arteriogram was performed. Next, the gastroduodenal artery was percutaneously coil embolized to near its origin with multiple overlapping 4 mm and 5 mm soft and standard interlock coils. The microcatheter was retracted into the common hepatic artery and a post embolization common hepatic arteriogram was performed. Next, the microcatheter was utilized select both the right and left hepatic arteries and selective arteriograms was performed at both locations. During catheter manipulation, the previously not identified right gastric artery was inadvertently selected. Attempts were made to embolize the origin of the vessel, however this proved unsuccessful secondary to lack of purchase of the microcatheter at the tiny vessel's origin and inability to cannulate the vessel further. As such, the microcatheter was advanced to the level of proper hepatic artery and a selective proper hepatic arteriogram was performed and 5 mCi of technetium 99 MAA was administered for this location. At this point, the procedure was terminated. All wires and catheters and sheaths were removed from the patient. Hemostasis was achieved at the right groin and access site with manual compression. A dressing was placed. The patient tolerated procedure well without immediate postprocedural complication. The patient was escorted to nuclear medicine department for planar imaging. FINDINGS: Selective superior mesenteric arteriogram is negative for definitive replaced or accessory hepatic arterial supply. Delayed portal venous imaging demonstrates patency of the main, right and left portal veins. Selective celiac arteriogram demonstrates non conventional anatomy with a separate accessory medial segmental branch of the right hepatic artery arising proximal  to vessel's normal bifurcation. A right gastric artery was inadvertently cannulated and sub selectively injected, however despite prolonged efforts, the vessel could not be further cannulated to allow for successful percutaneous coil embolization. Selective right and left hepatic arteriogram demonstrate otherwise conventional branching pattern without definitive evidence of extrahepatic arterial supply. Successful administration of MAA from the level of the proper hepatic artery, IMPRESSION: 1. Successful pre Y-90 arteriogram with percutaneous coil embolization of the gastroduodenal artery. 2. Successful right gastric arteriogram, however the vessel could not be percutaneously coil embolized secondary to tortuosity and small caliber of the vessel's origin. 3. Successful administration of 5 mCi of technetium 40mMAA via the proper hepatic artery. Awaiting results of nuclear medicine liver scan. PLAN: Pending the results of the nuclear medicine liver scan, the patient will return for Y 90 radioembolization of the right lobe of the liver. At the time of the planned right lobe radioembolization additional attempts will be made to coil the right gastric artery. Electronically Signed   By: JSandi MariscalM.D.   On: 07/13/2019 12:22   Ir Angiogram Selective Each Additional Vessel  Result Date: 07/13/2019 INDICATION: History of metastatic bladder cancer. Patient presents today for mapping/pre Y 90 radioembolization. EXAM: 1. ULTRASOUND GUIDANCE FOR ARTERIAL ACCESS 2. CELIAC AND SUPERIOR MESENTERIC ARTERIOGRAM (1st ORDER) 3. COMMON HEPATIC ARTERIOGRAM (3rd ORDER) 4. GASTRODUODENAL ARTERIOGRAM (3rd ORDER) AND PERCUTANEOUS COIL EMBOLIZATION 5. SELECTIVE LEFT HEPATIC ARTERIOGRAM (3rd  ORDER) 6. SELECTIVE RIGHT GASTRIC ARTERIOGRAM (3rd ORDER) 7. SELECTIVE RIGHT HEPATIC ARTERIOGRAM (3rd ORDER) 8. PROPER HEPATIC ARTERIOGRAM (3rd ORDER) AND FLUOROSCOPIC GUIDED ADMINISTRATION OF Tc41mMAA COMPARISON:  CT abdomen pelvis-05/24/2019;  PET-CT-06/17/2019 MEDICATIONS: Zosyn 3.375 g IV RADIOPHARMACEUTICALS:  5 mCi Tc960mAA administered via the proper hepatic artery CONTRAST:  140 cc Omnipaque 300 ANESTHESIA/SEDATION: Moderate (conscious) sedation was employed during this procedure. A total of Versed 3 mg and Fentanyl 100 mcg was administered intravenously. Moderate Sedation Time: 88 minutes. The patient's level of consciousness and vital signs were monitored continuously by radiology nursing throughout the procedure under my direct supervision. FLUOROSCOPY TIME:  26 minutes, 42 seconds (3,865 mGy) ACCESS: Right common femoral artery; hemostasis achieved with manual compression. COMPLICATIONS: None immediate. TECHNIQUE: Informed written consent was obtained from the patient after a discussion of the risks, benefits and alternatives to treatment. Questions regarding the procedure were encouraged and answered. A timeout was performed prior to the initiation of the procedure. The right groin was prepped and draped in the usual sterile fashion, and a sterile drape was applied covering the operative field. Maximum barrier sterile technique with sterile gowns and gloves were used for the procedure. A timeout was performed prior to the initiation of the procedure. Local anesthesia was provided with 1% lidocaine. The right femoral head was marked fluoroscopically. Under ultrasound guidance, the right common femoral artery was accessed with a micropuncture kit after the overlying soft tissues were anesthetized with 1% lidocaine. An ultrasound image was saved for documentation purposes. The micropuncture sheath was exchanged for a 5 Fr[place]ascular sheath over a Bentson wire. A closure arteriogram was performed through the side of the sheath confirming access within the right common femoral artery. Over a Bentson wire, a Mickelson catheter was advanced to the level of the thoracic aorta where it was back bled and flushed. The catheter was then utilized to  select the superior mesenteric artery and a superior mesenteric arteriogram with delayed portal venous imaging was performed. The Mickelson catheter was then advanced cranially and utilized to select the celiac artery and a celiac arteriogram was performed. Utilizing a fathom 14 microwire, a regular Renegade microcatheter was advanced into the common hepatic artery and a selective common hepatic arteriogram was performed. The microcatheter was then utilized to select the gastroduodenal artery and a selective gastroduodenal arteriogram was performed. Next, the gastroduodenal artery was percutaneously coil embolized to near its origin with multiple overlapping 4 mm and 5 mm soft and standard interlock coils. The microcatheter was retracted into the common hepatic artery and a post embolization common hepatic arteriogram was performed. Next, the microcatheter was utilized select both the right and left hepatic arteries and selective arteriograms was performed at both locations. During catheter manipulation, the previously not identified right gastric artery was inadvertently selected. Attempts were made to embolize the origin of the vessel, however this proved unsuccessful secondary to lack of purchase of the microcatheter at the tiny vessel's origin and inability to cannulate the vessel further. As such, the microcatheter was advanced to the level of proper hepatic artery and a selective proper hepatic arteriogram was performed and 5 mCi of technetium 99 MAA was administered for this location. At this point, the procedure was terminated. All wires and catheters and sheaths were removed from the patient. Hemostasis was achieved at the right groin and access site with manual compression. A dressing was placed. The patient tolerated procedure well without immediate postprocedural complication. The patient was escorted to nuclear medicine department for planar  imaging. FINDINGS: Selective superior mesenteric arteriogram  is negative for definitive replaced or accessory hepatic arterial supply. Delayed portal venous imaging demonstrates patency of the main, right and left portal veins. Selective celiac arteriogram demonstrates non conventional anatomy with a separate accessory medial segmental branch of the right hepatic artery arising proximal to vessel's normal bifurcation. A right gastric artery was inadvertently cannulated and sub selectively injected, however despite prolonged efforts, the vessel could not be further cannulated to allow for successful percutaneous coil embolization. Selective right and left hepatic arteriogram demonstrate otherwise conventional branching pattern without definitive evidence of extrahepatic arterial supply. Successful administration of MAA from the level of the proper hepatic artery, IMPRESSION: 1. Successful pre Y-90 arteriogram with percutaneous coil embolization of the gastroduodenal artery. 2. Successful right gastric arteriogram, however the vessel could not be percutaneously coil embolized secondary to tortuosity and small caliber of the vessel's origin. 3. Successful administration of 5 mCi of technetium 54mMAA via the proper hepatic artery. Awaiting results of nuclear medicine liver scan. PLAN: Pending the results of the nuclear medicine liver scan, the patient will return for Y 90 radioembolization of the right lobe of the liver. At the time of the planned right lobe radioembolization additional attempts will be made to coil the right gastric artery. Electronically Signed   By: JSandi MariscalM.D.   On: 07/13/2019 12:22   Nm Special Med Rad Physics Cons  Result Date: 07/27/2019 CLINICAL DATA:  Unresectable liver metastasis. Bladder carcinoma primary. First treatment to the RIGHT hepatic lobe. EXAM: NUCLEAR MEDICINE SPECIAL MED RAD PHYSICS CONS; NUCLEAR MEDICINE RADIO PHARM THERAPY INTRA ARTERIAL; NUCLEAR MEDICINE TREATMENT PROCEDURE; NUCLEAR MEDICINE LIVER SCAN TECHNIQUE: In  conjunction with the interventional radiologist a Y- Microsphere dose was calculated utilizing body surface area formulation. Calculated dose equal 28.2 mCi. Pre therapy MAA liver SPECT scan and CTA were evaluated. Utilizing a microcatheter system, the RIGHT hepatic artery was selected and Y-90 microspheres were delivered in fractionated aliquots. Radiopharmaceutical was delivered by the interventional radiologist and nuclear radiologist. The patient tolerated procedure well. No adverse effects were noted. Bremsstrahlung planar and SPECT imaging of the abdomen following intrahepatic arterial delivery of Y-90 microsphere was performed. RADIOPHARMACEUTICALS:  216.1millicuries yttrium 90 microspheres. COMPARISON:  PET-CT 06/17/2019, MRI 06/03/2019 FINDINGS: Y - 90 microspheres therapy as above. First therapy the right hepatic lobe. Bremsstrahlung planar and SPECT imaging of the abdomen following intrahepatic arterial delivery of Y-920mrosphere demonstrates radioactivity localized to the RIGHT hepatic lobe. No evidence of extrahepatic activity. IMPRESSION: Successful Y - 90 microsphere delivery for treatment of unresectable liver metastasis. First therapy to the RIGHT lobe. Bremssstrahlung scan demonstrates activity localized to RIGHT hepatic lobe with no extrahepatic activity identified. Electronically Signed   By: StSuzy Bouchard.D.   On: 07/27/2019 12:57   Nm Special Treatment Procedure  Result Date: 07/27/2019 CLINICAL DATA:  Unresectable liver metastasis. Bladder carcinoma primary. First treatment to the RIGHT hepatic lobe. EXAM: NUCLEAR MEDICINE SPECIAL MED RAD PHYSICS CONS; NUCLEAR MEDICINE RADIO PHARM THERAPY INTRA ARTERIAL; NUCLEAR MEDICINE TREATMENT PROCEDURE; NUCLEAR MEDICINE LIVER SCAN TECHNIQUE: In conjunction with the interventional radiologist a Y- Microsphere dose was calculated utilizing body surface area formulation. Calculated dose equal 28.2 mCi. Pre therapy MAA liver SPECT scan and CTA were  evaluated. Utilizing a microcatheter system, the RIGHT hepatic artery was selected and Y-90 microspheres were delivered in fractionated aliquots. Radiopharmaceutical was delivered by the interventional radiologist and nuclear radiologist. The patient tolerated procedure well. No adverse effects were noted. Bremsstrahlung planar and SPECT  imaging of the abdomen following intrahepatic arterial delivery of Y-90 microsphere was performed. RADIOPHARMACEUTICALS:  76.2 millicuries yttrium 90 microspheres. COMPARISON:  PET-CT 06/17/2019, MRI 06/03/2019 FINDINGS: Y - 90 microspheres therapy as above. First therapy the right hepatic lobe. Bremsstrahlung planar and SPECT imaging of the abdomen following intrahepatic arterial delivery of Y-13mcrosphere demonstrates radioactivity localized to the RIGHT hepatic lobe. No evidence of extrahepatic activity. IMPRESSION: Successful Y - 90 microsphere delivery for treatment of unresectable liver metastasis. First therapy to the RIGHT lobe. Bremssstrahlung scan demonstrates activity localized to RIGHT hepatic lobe with no extrahepatic activity identified. Electronically Signed   By: SSuzy BouchardM.D.   On: 07/27/2019 12:57   Ir [place] Vasc Access Right  Result Date: 07/27/2019 INDICATION: History of metastatic bladder cancer. Patient presents today for Y 90 radioembolization of the right lobe of the liver Note, pre Y 90 mapping radioembolization demonstrated a right gastric artery however this cannot be successfully coil embolized. As such will also attempt repeat coil embolization of the right gastric artery. EXAM: 1. ULTRASOUND GUIDANCE FOR ARTERIAL ACCESS 2. CELIAC ARTERIOGRAM 3. RIGHT GASTRIC ARTERIOGRAM 4. RIGHT HEPATIC AND RADIOEMBOLIZATION OF THE RIGHT LOBE OF THE LIVER 5. FLUOROSCOPIC GUIDED ADMINISTRATION OF SIR-SPHERES COMPARISON:  Mapping Y 90 radioembolization-07/13/2019; PET-CT-06/17/2019; CT abdomen pelvis-05/24/2019; abdominal MRI - 06/03/2019 MEDICATIONS:  Protonix 40 mg IV; Decadron 20 mg IV; Toradol 30 mg IV; Zofran 4 mg IV CONTRAST:  80 cc OMNIPAQUE IOHEXOL 300 MG/ML  SOLN ANESTHESIA/SEDATION: Moderate (conscious) sedation was employed during this procedure. A total of Versed 3 mg and Fentanyl 200 mcg was administered intravenously. Moderate Sedation Time: 70 minutes. The patient's level of consciousness and vital signs were monitored continuously by radiology nursing throughout the procedure under my direct supervision. FLUOROSCOPY TIME:  15 minutes, 30 seconds (1,208 mGy) ACCESS: Right common femoral artery; hemostasis achieved with manual compression. COMPLICATIONS: None immediate. TECHNIQUE: Informed written consent was obtained from the patient after a discussion of the risks, benefits and alternatives to treatment. Questions regarding the procedure were encouraged and answered. A timeout was performed prior to the initiation of the procedure. The right groin was prepped and draped in the usual sterile fashion, and a sterile drape was applied covering the operative field. Maximum barrier sterile technique with sterile gowns and gloves were used for the procedure. A timeout was performed prior to the initiation of the procedure. Local anesthesia was provided with 1% lidocaine. The right femoral head was marked fluoroscopically. Under direct ultrasound guidance, the right common femoral artery was accessed with a micropuncture kit allowing placement of a of 5-French vascular sheath. An ultrasound image was saved for documentation purposes. A limited arteriogram performed through the side arm of the sheath confirming appropriate access within the right common femoral artery. Over a BBritta Mccreedywire, a Mickelson catheter was advanced to the level of the inferior thoracic aorta where it was reformed, back bled and flushed. The Mickelson catheter was utilized to select the celiac artery and a celiac arteriogram was performed. Next, with the use of a fathom 14 microwire,  a regular Renegade microcatheter was utilized to select the right gastric artery. Despite successful cannulation of the origin the right gastric artery and advancement of a microwire to its mid aspect, the microcatheter would not track beyond the origin the vessel secondary to tortuosity of the vessel's origin as well as it's small caliber. Next, the microcatheter was advanced to the level of the right hepatic artery and a selective right hepatic arteriogram was performed. Appropriate position  was confirmed and the Radioembolization was then performed with Yttrium-90 SIR Spheres. Particles were administered via a microcatheter utilizing a completely enclosed system. Monitoring of antegrade flow was performed during and after the administration under fluoroscopy with use of contrast intermittently. After the administration of the particles, the microcatheter, outer catheter and administration system were then discarded into radiation safety receptacle. At this point, the procedure was terminated. The right common femoral approach vascular sheath was removed and hemostasis was achieved with manual compression. A dressing was placed. The patient tolerated procedure well without immediate postprocedural complication. All participants involved with the procedure were surveyed by the radiation safety officer prior to exiting the fluoroscopy suite. The patient was escorted to the nuclear medicine department for post-treatment imaging. FINDINGS: Selective celiac arteriogram demonstrates complete embolization of the gastroduodenal artery without residual flow. Selective right gastric arteriogram demonstrates patency and tortuosity/narrowing of the origin and proximal aspect. The origin of the right gastric artery was again noted to be adjacent to the dominant left hepatic artery. Despite catheterization of the origin of the vessel and advancement of a microwire to its mid aspect, the microcatheter could not be advanced  beyond the origin to allow for safe percutaneous coil embolization. 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 The microcatheter was placed proximal to the origin of the accessory right hepatic artery and Y 90 radioembolization was performed from this location. Intermittent contrast injections during and after the radioembolization confirming preserved antegrade flow without reflux. IMPRESSION: 1. Technically successful radioembolization of the right lobe of the liver with Yttrium-90 microspheres. 2. Attempted though unsuccessful coil embolization of the right gastric artery secondary to tortuosity of the vessel's origin and small caliber of the vessel. PLAN: - Given inability to successfully percutaneously coil embolized the right gastric artery, Y 90 radioembolization of left lobe of the liver will NOT be pursued given proximity of the right gastric artery to the origin of the left hepatic artery and concern for nontarget embolization in this patient with progressive/advanced intra and extrahepatic metastatic disease. - The patient will be seen in follow-up consultation in the IR clinic in 3-4 weeks following acquisition of postprocedural CMP. - initial surveillance imaging will be performed in 3 months (likely with PET-CT). Electronically Signed   By: Sandi Mariscal M.D.   On: 07/27/2019 13:14   Ir US Guide Vasc Access Right  Result Date: 07/13/2019 INDICATION: History of metastatic bladder cancer. Patient presents today for mapping/pre Y 90 radioembolization. EXAM: 1. ULTRASOUND GUIDANCE FOR ARTERIAL ACCESS 2. CELIAC AND SUPERIOR MESENTERIC ARTERIOGRAM (1st ORDER) 3. COMMON HEPATIC ARTERIOGRAM (3rd ORDER) 4. GASTRODUODENAL ARTERIOGRAM (3rd ORDER) AND PERCUTANEOUS COIL EMBOLIZATION 5. SELECTIVE LEFT HEPATIC ARTERIOGRAM (3rd ORDER) 6. SELECTIVE RIGHT GASTRIC ARTERIOGRAM (3rd ORDER) 7. SELECTIVE  RIGHT HEPATIC ARTERIOGRAM (3rd ORDER) 8. PROPER HEPATIC ARTERIOGRAM (3rd ORDER) AND FLUOROSCOPIC GUIDED ADMINISTRATION OF Tc33mMAA COMPARISON:  CT abdomen pelvis-05/24/2019; PET-CT-06/17/2019 MEDICATIONS: Zosyn 3.375 g IV RADIOPHARMACEUTICALS:  5 mCi Tc960mAA administered via the proper hepatic artery CONTRAST:  140 cc Omnipaque 300 ANESTHESIA/SEDATION: Moderate (conscious) sedation was employed during this procedure. A total of Versed 3 mg and Fentanyl 100 mcg was administered intravenously. Moderate Sedation Time: 88 minutes. The patient's level of consciousness and vital signs were monitored continuously by radiology nursing throughout the procedure under my direct supervision. FLUOROSCOPY TIME:  26 minutes, 42 seconds (3,865 mGy) ACCESS: Right common femoral artery; hemostasis achieved with manual compression. COMPLICATIONS:  None immediate. TECHNIQUE: Informed written consent was obtained from the patient after a discussion of the risks, benefits and alternatives to treatment. Questions regarding the procedure were encouraged and answered. A timeout was performed prior to the initiation of the procedure. The right groin was prepped and draped in the usual sterile fashion, and a sterile drape was applied covering the operative field. Maximum barrier sterile technique with sterile gowns and gloves were used for the procedure. A timeout was performed prior to the initiation of the procedure. Local anesthesia was provided with 1% lidocaine. The right femoral head was marked fluoroscopically. Under ultrasound guidance, the right common femoral artery was accessed with a micropuncture kit after the overlying soft tissues were anesthetized with 1% lidocaine. An ultrasound image was saved for documentation purposes. The micropuncture sheath was exchanged for a 5 [place] vascular sheath over a Bentson wire. A closure arteriogram was performed through the side of the sheath confirming access within the right common  femoral artery. Over a Bentson wire, a Mickelson catheter was advanced to the level of the thoracic aorta where it was back bled and flushed. The catheter was then utilized to select the superior mesenteric artery and a superior mesenteric arteriogram with delayed portal venous imaging was performed. The Mickelson catheter was then advanced cranially and utilized to select the celiac artery and a celiac arteriogram was performed. Utilizing a fathom 14 microwire, a regular Renegade microcatheter was advanced into the common hepatic artery and a selective common hepatic arteriogram was performed. The microcatheter was then utilized to select the gastroduodenal artery and a selective gastroduodenal arteriogram was performed. Next, the gastroduodenal artery was percutaneously coil embolized to near its origin with multiple overlapping 4 mm and 5 mm soft and standard interlock coils. The microcatheter was retracted into the common hepatic artery and a post embolization common hepatic arteriogram was performed. Next, the microcatheter was utilized select both the right and left hepatic arteries and selective arteriograms was performed at both locations. During catheter manipulation, the previously not identified right gastric artery was inadvertently selected. Attempts were made to embolize the origin of the vessel, however this proved unsuccessful secondary to lack of purchase of the microcatheter at the tiny vessel's origin and inability to cannulate the vessel further. As such, the microcatheter was advanced to the level of proper hepatic artery and a selective proper hepatic arteriogram was performed and 5 mCi of technetium 99 MAA was administered for this location. At this point, the procedure was terminated. All wires and catheters and sheaths were removed from the patient. Hemostasis was achieved at the right groin and access site with manual compression. A dressing was placed. The patient tolerated procedure well  without immediate postprocedural complication. The patient was escorted to nuclear medicine department for planar imaging. FINDINGS: Selective superior mesenteric arteriogram is negative for definitive replaced or accessory hepatic arterial supply. Delayed portal venous imaging demonstrates patency of the main, right and left portal veins. Selective celiac arteriogram demonstrates non conventional anatomy with a separate accessory medial segmental branch of the right hepatic artery arising proximal to vessel's normal bifurcation. A right gastric artery was inadvertently cannulated and sub selectively injected, however despite prolonged efforts, the vessel could not be further cannulated to allow for successful percutaneous coil embolization. Selective right and left hepatic arteriogram demonstrate otherwise conventional branching pattern without definitive evidence of extrahepatic arterial supply. Successful administration of MAA from the level of the proper hepatic artery, IMPRESSION: 1. Successful pre Y-90 arteriogram with percutaneous coil embolization of  the gastroduodenal artery. 2. Successful right gastric arteriogram, however the vessel could not be percutaneously coil embolized secondary to tortuosity and small caliber of the vessel's origin. 3. Successful administration of 5 mCi of technetium 41mMAA via the proper hepatic artery. Awaiting results of nuclear medicine liver scan. PLAN: Pending the results of the nuclear medicine liver scan, the patient will return for Y 90 radioembolization of the right lobe of the liver. At the time of the planned right lobe radioembolization additional attempts will be made to coil the right gastric artery. Electronically Signed   By: JSandi MariscalM.D.   On: 07/13/2019 12:22   Ir Embo Arterial Not HBay ViewRoadmapping  Result Date: 07/13/2019 INDICATION: History of metastatic bladder cancer. Patient presents today for mapping/pre Y 90 radioembolization.  EXAM: 1. ULTRASOUND GUIDANCE FOR ARTERIAL ACCESS 2. CELIAC AND SUPERIOR MESENTERIC ARTERIOGRAM (1st ORDER) 3. COMMON HEPATIC ARTERIOGRAM (3rd ORDER) 4. GASTRODUODENAL ARTERIOGRAM (3rd ORDER) AND PERCUTANEOUS COIL EMBOLIZATION 5. SELECTIVE LEFT HEPATIC ARTERIOGRAM (3rd ORDER) 6. SELECTIVE RIGHT GASTRIC ARTERIOGRAM (3rd ORDER) 7. SELECTIVE RIGHT HEPATIC ARTERIOGRAM (3rd ORDER) 8. PROPER HEPATIC ARTERIOGRAM (3rd ORDER) AND FLUOROSCOPIC GUIDED ADMINISTRATION OF Tc935mAA COMPARISON:  CT abdomen pelvis-05/24/2019; PET-CT-06/17/2019 MEDICATIONS: Zosyn 3.375 g IV RADIOPHARMACEUTICALS:  5 mCi Tc9920mA administered via the proper hepatic artery CONTRAST:  140 cc Omnipaque 300 ANESTHESIA/SEDATION: Moderate (conscious) sedation was employed during this procedure. A total of Versed 3 mg and Fentanyl 100 mcg was administered intravenously. Moderate Sedation Time: 88 minutes. The patient's level of consciousness and vital signs were monitored continuously by radiology nursing throughout the procedure under my direct supervision. FLUOROSCOPY TIME:  26 minutes, 42 seconds (3,865 mGy) ACCESS: Right common femoral artery; hemostasis achieved with manual compression. COMPLICATIONS: None immediate. TECHNIQUE: Informed written consent was obtained from the patient after a discussion of the risks, benefits and alternatives to treatment. Questions regarding the procedure were encouraged and answered. A timeout was performed prior to the initiation of the procedure. The right groin was prepped and draped in the usual sterile fashion, and a sterile drape was applied covering the operative field. Maximum barrier sterile technique with sterile gowns and gloves were used for the procedure. A timeout was performed prior to the initiation of the procedure. Local anesthesia was provided with 1% lidocaine. The right femoral head was marked fluoroscopically. Under ultrasound guidance, the right common femoral artery was accessed with a  micropuncture kit after the overlying soft tissues were anesthetized with 1% lidocaine. An ultrasound image was saved for documentation purposes. The micropuncture sheath was exchanged for a 5 Fre[place]istanscular sheath over a Bentson wire. A closure arteriogram was performed through the side of the sheath confirming access within the right common femoral artery. Over a Bentson wire, a Mickelson catheter was advanced to the level of the thoracic aorta where it was back bled and flushed. The catheter was then utilized to select the superior mesenteric artery and a superior mesenteric arteriogram with delayed portal venous imaging was performed. The Mickelson catheter was then advanced cranially and utilized to select the celiac artery and a celiac arteriogram was performed. Utilizing a fathom 14 microwire, a regular Renegade microcatheter was advanced into the common hepatic artery and a selective common hepatic arteriogram was performed. The microcatheter was then utilized to select the gastroduodenal artery and a selective gastroduodenal arteriogram was performed. Next, the gastroduodenal artery was percutaneously coil embolized to near its origin with multiple overlapping 4 mm and 5 mm soft and standard interlock coils.  The microcatheter was retracted into the common hepatic artery and a post embolization common hepatic arteriogram was performed. Next, the microcatheter was utilized select both the right and left hepatic arteries and selective arteriograms was performed at both locations. During catheter manipulation, the previously not identified right gastric artery was inadvertently selected. Attempts were made to embolize the origin of the vessel, however this proved unsuccessful secondary to lack of purchase of the microcatheter at the tiny vessel's origin and inability to cannulate the vessel further. As such, the microcatheter was advanced to the level of proper hepatic artery and a selective proper hepatic  arteriogram was performed and 5 mCi of technetium 99 MAA was administered for this location. At this point, the procedure was terminated. All wires and catheters and sheaths were removed from the patient. Hemostasis was achieved at the right groin and access site with manual compression. A dressing was placed. The patient tolerated procedure well without immediate postprocedural complication. The patient was escorted to nuclear medicine department for planar imaging. FINDINGS: Selective superior mesenteric arteriogram is negative for definitive replaced or accessory hepatic arterial supply. Delayed portal venous imaging demonstrates patency of the main, right and left portal veins. Selective celiac arteriogram demonstrates non conventional anatomy with a separate accessory medial segmental branch of the right hepatic artery arising proximal to vessel's normal bifurcation. A right gastric artery was inadvertently cannulated and sub selectively injected, however despite prolonged efforts, the vessel could not be further cannulated to allow for successful percutaneous coil embolization. Selective right and left hepatic arteriogram demonstrate otherwise conventional branching pattern without definitive evidence of extrahepatic arterial supply. Successful administration of MAA from the level of the proper hepatic artery, IMPRESSION: 1. Successful pre Y-90 arteriogram with percutaneous coil embolization of the gastroduodenal artery. 2. Successful right gastric arteriogram, however the vessel could not be percutaneously coil embolized secondary to tortuosity and small caliber of the vessel's origin. 3. Successful administration of 5 mCi of technetium 39mMAA via the proper hepatic artery. Awaiting results of nuclear medicine liver scan. PLAN: Pending the results of the nuclear medicine liver scan, the patient will return for Y 90 radioembolization of the right lobe of the liver. At the time of the planned right lobe  radioembolization additional attempts will be made to coil the right gastric artery. Electronically Signed   By: JSandi MariscalM.D.   On: 07/13/2019 12:22   Ir Embo Tumor Organ Ischemia Infarct Inc Guide Roadmapping  Result Date: 07/27/2019 INDICATION: History of metastatic bladder cancer. Patient presents today for Y 90 radioembolization of the right lobe of the liver Note, pre Y 90 mapping radioembolization demonstrated a right gastric artery however this cannot be successfully coil embolized. As such will also attempt repeat coil embolization of the right gastric artery. EXAM: 1. ULTRASOUND GUIDANCE FOR ARTERIAL ACCESS 2. CELIAC ARTERIOGRAM 3. RIGHT GASTRIC ARTERIOGRAM 4. RIGHT HEPATIC AND RADIOEMBOLIZATION OF THE RIGHT LOBE OF THE LIVER 5. FLUOROSCOPIC GUIDED [facility] COMPARISON:  Mapping Y 90 radioembolization-07/13/2019; PET-CT-06/17/2019; CT abdomen pelvis-05/24/2019; abdominal MRI - 06/03/2019 MEDICATIONS: Protonix 40 mg IV; Decadron 20 mg IV; Toradol 30 mg IV; Zofran 4 mg IV CONTRAST:  80 cc OMNIPAQUE IOHEXOL 300 MG/ML  SOLN ANESTHESIA/SEDATION: Moderate (conscious) sedation was employed during this procedure. A total of Versed 3 mg and Fentanyl 200 mcg was administered intravenously. Moderate Sedation Time: 70 minutes. The patient's level of consciousness and vital signs were monitored continuously by radiology nursing throughout the procedure under my direct supervision. FLUOROSCOPY TIME:  15 minutes,  30 seconds (1,208 mGy) ACCESS: Right common femoral artery; hemostasis achieved with manual compression. COMPLICATIONS: None immediate. TECHNIQUE: Informed written consent was obtained from the patient after a discussion of the risks, benefits and alternatives to treatment. Questions regarding the procedure were encouraged and answered. A timeout was performed prior to the initiation of the procedure. The right groin was prepped and draped in the usual sterile fashion, and a sterile  drape was applied covering the operative field. Maximum barrier sterile technique with sterile gowns and gloves were used for the procedure. A timeout was performed prior to the initiation of the procedure. Local anesthesia was provided with 1% lidocaine. The right femoral head was marked fluoroscopically. Under direct ultrasound guidance, the right common femoral artery was accessed with a micropuncture kit allowing placement of a of 5-French vascular sheath. An ultrasound image was saved for documentation purposes. A limited arteriogram performed through the side arm of the sheath confirming appropriate access within the right common femoral artery. Over a Britta Mccreedy wire, a Mickelson catheter was advanced to the level of the inferior thoracic aorta where it was reformed, back bled and flushed. The Mickelson catheter was utilized to select the celiac artery and a celiac arteriogram was performed. Next, with the use of a fathom 14 microwire, a regular Renegade microcatheter was utilized to select the right gastric artery. Despite successful cannulation of the origin the right gastric artery and advancement of a microwire to its mid aspect, the microcatheter would not track beyond the origin the vessel secondary to tortuosity of the vessel's origin as well as it's small caliber. Next, the microcatheter was advanced to the level of the right hepatic artery and a selective right hepatic arteriogram was performed. Appropriate position was confirmed and the Radioembolization was then performed with Yttrium-90 SIR Spheres. Particles were administered via a microcatheter utilizing a completely enclosed system. Monitoring of antegrade flow was performed during and after the administration under fluoroscopy with use of contrast intermittently. After the administration of the particles, the microcatheter, outer catheter and administration system were then discarded into radiation safety receptacle. At this point, the procedure  was terminated. The right common femoral approach vascular sheath was removed and hemostasis was achieved with manual compression. A dressing was placed. The patient tolerated procedure well without immediate postprocedural complication. All participants involved with the procedure were surveyed by the radiation safety officer prior to exiting the fluoroscopy suite. The patient was escorted to the nuclear medicine department for post-treatment imaging. FINDINGS: Selective celiac arteriogram demonstrates complete embolization of the gastroduodenal artery without residual flow. Selective right gastric arteriogram demonstrates patency and tortuosity/narrowing of the origin and proximal aspect. The origin of the right gastric artery was again noted to be adjacent to the dominant left hepatic artery. Despite catheterization of the origin of the vessel and advancement of a microwire to its mid aspect, the microcatheter could not be advanced beyond the origin to allow for safe percutaneous coil embolization. 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 The microcatheter was placed proximal to the origin of the accessory right hepatic artery and Y 90 radioembolization was performed from this location. Intermittent contrast injections during and after the radioembolization confirming preserved antegrade flow without reflux. IMPRESSION: 1. Technically successful radioembolization of the right lobe of the liver with Yttrium-90 microspheres. 2. Attempted though unsuccessful coil embolization of the right gastric artery secondary to tortuosity of the vessel's origin and small  caliber of the vessel. PLAN: - Given inability to successfully percutaneously coil embolized the right gastric artery, Y 90 radioembolization of left lobe of the liver will NOT be pursued given proximity of the right gastric artery to the  origin of the left hepatic artery and concern for nontarget embolization in this patient with progressive/advanced intra and extrahepatic metastatic disease. - The patient will be seen in follow-up consultation in the IR clinic in 3-4 weeks following acquisition of postprocedural CMP. - initial surveillance imaging will be performed in 3 months (likely with PET-CT). Electronically Signed   By: Sandi Mariscal M.D.   On: 07/27/2019 13:14   Nm Radio Pharm Therapy Intraarterial  Result Date: 07/27/2019 CLINICAL DATA:  Unresectable liver metastasis. Bladder carcinoma primary. First treatment to the RIGHT hepatic lobe. EXAM: NUCLEAR MEDICINE SPECIAL MED RAD PHYSICS CONS; NUCLEAR MEDICINE RADIO PHARM THERAPY INTRA ARTERIAL; NUCLEAR MEDICINE TREATMENT PROCEDURE; NUCLEAR MEDICINE LIVER SCAN TECHNIQUE: In conjunction with the interventional radiologist a Y- Microsphere dose was calculated utilizing body surface area formulation. Calculated dose equal 28.2 mCi. Pre therapy MAA liver SPECT scan and CTA were evaluated. Utilizing a microcatheter system, the RIGHT hepatic artery was selected and Y-90 microspheres were delivered in fractionated aliquots. Radiopharmaceutical was delivered by the interventional radiologist and nuclear radiologist. The patient tolerated procedure well. No adverse effects were noted. Bremsstrahlung planar and SPECT imaging of the abdomen following intrahepatic arterial delivery of Y-90 microsphere was performed. RADIOPHARMACEUTICALS:  26.9 millicuries yttrium 90 microspheres. COMPARISON:  PET-CT 06/17/2019, MRI 06/03/2019 FINDINGS: Y - 90 microspheres therapy as above. First therapy the right hepatic lobe. Bremsstrahlung planar and SPECT imaging of the abdomen following intrahepatic arterial delivery of Y-57mcrosphere demonstrates radioactivity localized to the RIGHT hepatic lobe. No evidence of extrahepatic activity. IMPRESSION: Successful Y - 90 microsphere delivery for treatment of unresectable  liver metastasis. First therapy to the RIGHT lobe. Bremssstrahlung scan demonstrates activity localized to RIGHT hepatic lobe with no extrahepatic activity identified. Electronically Signed   By: SSuzy BouchardM.D.   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 Evaluation for yttrium 90 radiotherapy to the liver. EXAM: ULTRASOUND MISCELLANEOUS SOFT TISSUE; NUCLEAR MEDICINE LIVER SCAN TECHNIQUE: Abdominal images were obtained in multiple projections after intrahepatic arterial injection of radiopharmaceutical. SPECT imaging was performed. Lung shunt calculation was performed. RADIOPHARMACEUTICALS:  5.2 millicuries technetium 99 MAA COMPARISON:  PET-CT 06/17/2019, MRI abdomen 06/03/2019 FINDINGS: The injected microaggregated albumin localizes within the liver. No evidence of activity within the stomach, duodenum, or bowel. Calculated shunt fraction to the lungs equals 22%. IMPRESSION: 1. No significant extrahepatic radiotracer activity following intrahepatic arterial injection of MAA. 2. Significant lung shunt fraction equals 22% Electronically Signed   By: SSuzy BouchardM.D.   On: 07/13/2019 17:01    PERFORMANCE STATUS (ECOG) : 1 - Symptomatic but completely ambulatory  Review of Systems Unless otherwise noted, a complete review of systems is negative.  Physical Exam General: NAD, frail appearing, thin Pulmonary: Unlabored Extremities: no edema Skin: no rashes Neurological: Weakness but otherwise nonfocal  IMPRESSION: Follow-up visit with patient today in the clinic.  Patient was seen in the infusion area.  Patient reports that he is doing reasonably well.  He has had fatigue and weakness.  We have discussed referral to the care program but patient declined.  His daughter is a nMarine scientistand his son is an EMT and he says that they have planned an  in-home rehabilitative program  for him starting today.  His weight is down slightly today to 177 pounds from previous weight of 180 pounds 3 weeks ago.  Patient is followed by our dietitian.  Patient brought in ACP documents but they were not signed or notarized.  Discussed the process in completing those documents and asked that he bring [place] a copy after they are signed, witnessed, and notarized.  Patient says that he is still talking with his daughter about the MOST Form.  PLAN: -Continue current scope of treatment -MOST Form reviewed -Patient completing ACP documents -RTC in 3 weeks   Patient expressed understanding and was in agreement with this plan. He also understands that He can call the clinic at any time with any questions, concerns, or complaints.     Time Total: 15 minutes  Visit consisted of counseling and education dealing with the complex and emotionally intense issues of symptom management and palliative care in the setting of serious and potentially life-threatening illness.Greater than 50%  of this time was spent counseling and coordinating care related to the above assessment and plan.  Signed by: Altha Harm, PhD, NP-C [phone] (Work Cell)

## 2019-08-16 ENCOUNTER — Other Ambulatory Visit: Payer: Self-pay

## 2019-08-16 ENCOUNTER — Encounter: Payer: Self-pay | Admitting: Nurse Practitioner

## 2019-08-16 ENCOUNTER — Inpatient Hospital Stay: Payer: Medicare Other | Attending: Nurse Practitioner | Admitting: Nurse Practitioner

## 2019-08-16 ENCOUNTER — Telehealth: Payer: Self-pay | Admitting: *Deleted

## 2019-08-16 ENCOUNTER — Ambulatory Visit
Admission: RE | Admit: 2019-08-16 | Discharge: 2019-08-16 | Disposition: A | Discharge status: Home / Self Care | Payer: Medicare Other | Source: Ambulatory Visit | Attending: Nurse Practitioner | Admitting: Nurse Practitioner

## 2019-08-16 ENCOUNTER — Inpatient Hospital Stay: Payer: Medicare Other

## 2019-08-16 VITALS — BP 126/75 | HR 113 | Resp 18 | Wt 167.4 lb

## 2019-08-16 DIAGNOSIS — M7989 Other specified soft tissue disorders: Secondary | ICD-10-CM | POA: Insufficient documentation

## 2019-08-16 DIAGNOSIS — C787 Secondary malignant neoplasm of liver and intrahepatic bile duct: Secondary | ICD-10-CM | POA: Insufficient documentation

## 2019-08-16 DIAGNOSIS — R7989 Other specified abnormal findings of blood chemistry: Secondary | ICD-10-CM | POA: Insufficient documentation

## 2019-08-16 DIAGNOSIS — I10 Essential (primary) hypertension: Secondary | ICD-10-CM | POA: Diagnosis not present

## 2019-08-16 DIAGNOSIS — R918 Other nonspecific abnormal finding of lung field: Secondary | ICD-10-CM | POA: Diagnosis not present

## 2019-08-16 DIAGNOSIS — E875 Hyperkalemia: Secondary | ICD-10-CM | POA: Insufficient documentation

## 2019-08-16 DIAGNOSIS — D649 Anemia, unspecified: Secondary | ICD-10-CM | POA: Insufficient documentation

## 2019-08-16 DIAGNOSIS — R140 Abdominal distension (gaseous): Secondary | ICD-10-CM | POA: Insufficient documentation

## 2019-08-16 DIAGNOSIS — D72829 Elevated white blood cell count, unspecified: Secondary | ICD-10-CM | POA: Insufficient documentation

## 2019-08-16 DIAGNOSIS — C770 Secondary and unspecified malignant neoplasm of lymph nodes of head, face and neck: Secondary | ICD-10-CM | POA: Diagnosis not present

## 2019-08-16 DIAGNOSIS — G8929 Other chronic pain: Secondary | ICD-10-CM | POA: Insufficient documentation

## 2019-08-16 DIAGNOSIS — Z79899 Other long term (current) drug therapy: Secondary | ICD-10-CM | POA: Diagnosis not present

## 2019-08-16 DIAGNOSIS — G893 Neoplasm related pain (acute) (chronic): Secondary | ICD-10-CM

## 2019-08-16 DIAGNOSIS — M25511 Pain in right shoulder: Secondary | ICD-10-CM

## 2019-08-16 DIAGNOSIS — D494 Neoplasm of unspecified behavior of bladder: Secondary | ICD-10-CM | POA: Diagnosis not present

## 2019-08-16 DIAGNOSIS — R630 Anorexia: Secondary | ICD-10-CM | POA: Diagnosis not present

## 2019-08-16 DIAGNOSIS — C7951 Secondary malignant neoplasm of bone: Secondary | ICD-10-CM | POA: Diagnosis not present

## 2019-08-16 DIAGNOSIS — Z87891 Personal history of nicotine dependence: Secondary | ICD-10-CM | POA: Insufficient documentation

## 2019-08-16 DIAGNOSIS — R188 Other ascites: Secondary | ICD-10-CM | POA: Insufficient documentation

## 2019-08-16 DIAGNOSIS — C679 Malignant neoplasm of bladder, unspecified: Secondary | ICD-10-CM | POA: Diagnosis present

## 2019-08-16 DIAGNOSIS — Z923 Personal history of irradiation: Secondary | ICD-10-CM | POA: Insufficient documentation

## 2019-08-16 LAB — CBC WITH DIFFERENTIAL/PLATELET
Abs Immature Granulocytes: 0.06 10*3/uL (ref 0.00–0.07)
Basophils Absolute: 0.1 10*3/uL (ref 0.0–0.1)
Basophils Relative: 1 %
Eosinophils Absolute: 0 10*3/uL (ref 0.0–0.5)
Eosinophils Relative: 0 %
HCT: 34.4 % — ABNORMAL LOW (ref 39.0–52.0)
Hemoglobin: 10.8 g/dL — ABNORMAL LOW (ref 13.0–17.0)
Immature Granulocytes: 1 %
Lymphocytes Relative: 5 %
Lymphs Abs: 0.6 10*3/uL — ABNORMAL LOW (ref 0.7–4.0)
MCH: 29.7 pg (ref 26.0–34.0)
MCHC: 31.4 g/dL (ref 30.0–36.0)
MCV: 94.5 fL (ref 80.0–100.0)
Monocytes Absolute: 1.2 10*3/uL — ABNORMAL HIGH (ref 0.1–1.0)
Monocytes Relative: 11 %
Neutro Abs: 9.1 10*3/uL — ABNORMAL HIGH (ref 1.7–7.7)
Neutrophils Relative %: 82 %
Platelets: 128 10*3/uL — ABNORMAL LOW (ref 150–400)
RBC: 3.64 MIL/uL — ABNORMAL LOW (ref 4.22–5.81)
RDW: 18.8 % — ABNORMAL HIGH (ref 11.5–15.5)
WBC: 11 10*3/uL — ABNORMAL HIGH (ref 4.0–10.5)
nRBC: 0 % (ref 0.0–0.2)

## 2019-08-16 LAB — COMPREHENSIVE METABOLIC PANEL
ALT: 56 U/L — ABNORMAL HIGH (ref 0–44)
AST: 84 U/L — ABNORMAL HIGH (ref 15–41)
Albumin: 2.5 g/dL — ABNORMAL LOW (ref 3.5–5.0)
Alkaline Phosphatase: 434 U/L — ABNORMAL HIGH (ref 38–126)
Anion gap: 10 (ref 5–15)
BUN: 19 mg/dL (ref 8–23)
CO2: 17 mmol/L — ABNORMAL LOW (ref 22–32)
Calcium: 9.5 mg/dL (ref 8.9–10.3)
Chloride: 104 mmol/L (ref 98–111)
Creatinine, Ser: 0.9 mg/dL (ref 0.61–1.24)
GFR calc Af Amer: >60 mL/min (ref >60)
GFR calc non Af Amer: >60 mL/min (ref >60)
Glucose, Bld: 77 mg/dL (ref 70–99)
Potassium: 4.7 mmol/L (ref 3.5–5.1)
Sodium: 131 mmol/L — ABNORMAL LOW (ref 135–145)
Total Bilirubin: 1 mg/dL (ref 0.3–1.2)
Total Protein: 6.4 g/dL — ABNORMAL LOW (ref 6.5–8.1)

## 2019-08-16 NOTE — Telephone Encounter (Signed)
Appointment accepted for 1030 this morning No labs per Alease Medina, NP

## 2019-08-16 NOTE — Progress Notes (Signed)
Symptom Management [facility]  [phone] Fax:([phone]) [phone]  Patient Care Team: Baxter Hire, MD as PCP - General (Internal Medicine) [name], MD as PCP - Cardiology (Cardiology) [name], MD as Consulting Physician (Oncology)   Name of the patient: David Rich  [id number]  1946-05-10   Date of visit: 08/16/19  Diagnosis-recurrent stage IVa urothelial carcinoma  Chief complaint/ Reason for visit-leg swelling and abdominal distention  Heme/Onc history:  Oncology History  Urothelial carcinoma of bladder ([place])  03/23/2018 Initial Diagnosis   Urothelial carcinoma of bladder ([place])   03/26/2018 Cancer Staging   Staging form: Urinary Bladder, AJCC 8th Edition - Clinical: Stage IVA David Rich, cN2, cM1a) - Signed by [name], MD on 04/09/2018   04/08/2018 - 05/19/2019 Chemotherapy   The patient had palonosetron (ALOXI) injection 0.25 mg, 0.25 mg, Intravenous,  Once, 5 of 7 cycles Administration: 0.25 mg (04/08/2018), 0.25 mg (04/15/2018), 0.25 mg (04/29/2018), 0.25 mg (05/06/2018), 0.25 mg (05/20/2018), 0.25 mg (05/27/2018), 0.25 mg (03/25/2019), 0.25 mg (04/08/2019), 0.25 mg (04/29/2019), 0.25 mg (05/06/2019) CISplatin (PLATINOL) 74 mg in sodium chloride 0.9 % 250 mL chemo infusion, 35 mg/m2 = 74 mg (100 % of original dose 35 mg/m2), Intravenous,  Once, 5 of 7 cycles Dose modification: 35 mg/m2 (original dose 35 mg/m2, Cycle 1, Reason: Other (see comments)) Administration: 74 mg (04/08/2018), 74 mg (04/15/2018), 74 mg (04/29/2018), 74 mg (05/06/2018), 74 mg (05/20/2018), 74 mg (05/27/2018), 74 mg (03/25/2019), 74 mg (04/08/2019), 74 mg (04/29/2019), 74 mg (05/06/2019) gemcitabine (GEMZAR) 2,200 mg in sodium chloride 0.9 % 250 mL chemo infusion, 2,128 mg, Intravenous,  Once, 5 of 7 cycles Dose modification: 800 mg/m2 (original dose 1,000 mg/m2, Cycle 3, Reason: Dose not tolerated) Administration: 2,200 mg (04/08/2018), 2,200 mg (04/15/2018),  2,200 mg (04/29/2018), 2,000 mg (05/06/2018), 1,600 mg (05/20/2018), 1,600 mg (05/27/2018), 1,600 mg (03/25/2019), 1,600 mg (04/08/2019), 1,600 mg (04/29/2019), 1,600 mg (05/06/2019) fosaprepitant (EMEND) 150 mg, dexamethasone (DECADRON) 12 mg in sodium chloride 0.9 % 145 mL IVPB, , Intravenous,  Once, 5 of 7 cycles Administration:  (04/08/2018),  (04/15/2018),  (04/29/2018),  (05/06/2018),  (05/20/2018),  (05/27/2018),  (03/25/2019),  (04/08/2019),  (04/29/2019),  (05/06/2019)  for chemotherapy treatment.    06/03/2019 -  Chemotherapy   The patient had pembrolizumab (KEYTRUDA) 200 mg in sodium chloride 0.9 % 50 mL chemo infusion, 200 mg, Intravenous, Once, 4 of 6 cycles Administration: 200 mg (06/03/2019), 200 mg (06/22/2019), 200 mg (07/12/2019), 200 mg (08/02/2019)  for chemotherapy treatment.      Interval history-David Rich, 73 year old male with multiple medical problems including above history of recurrent stage IVa urothelial carcinoma with left supraclavicular lymph node metastasis, currently on palliative Keytruda, presents to symptom management clinic for reports of leg swelling and abdominal discomfort.  He says that over the past few weeks leg swelling has become worse.  Occurs in both legs symmetrically.  Unsure of anything that makes symptoms better or worse.   He also complains of worsening abdominal distention.  Continues to pass gas.  Bowel movements are regular though somewhat hard recently.  He previously had right flank pain and received Y 90 liver ablation.  Right flank pain has since resolved.  He feels increasingly weak and tired.  Estimates that he spends 20 hours a day in chair or bed.  His son and daughter provide assistance around the house and help him to do exercise.  He has had ongoing right shoulder pain which says is worse over the  past few weeks.  He takes fentanyl and hydrocodone for pain.  Says the pain is constant.  Localizes to right scapula.  Is unaware of bone disease in the area.   Has not received any prior radiation to the area.  Denies any neurologic complaints. Denies recent fevers or illnesses. Denies any easy bleeding or bruising.  Appetite is moderate.  He endorses weight loss. Denies chest pain. Denies any nausea, vomiting, constipation, or diarrhea. Denies urinary complaints. Patient offers no further specific complaints today.  ECOG FS:3 - Symptomatic, >50% confined to bed  Review of systems- Review of Systems  Constitutional: Positive for malaise/fatigue and weight loss. Negative for chills and fever.  HENT: Negative for hearing loss, nosebleeds, sore throat and tinnitus.   Eyes: Negative for blurred vision and double vision.  Respiratory: Negative for cough, hemoptysis, shortness of breath and wheezing.   Cardiovascular: Negative for chest pain, palpitations and leg swelling.  Gastrointestinal: Positive for abdominal pain. Negative for blood in stool, constipation, diarrhea, melena, nausea and vomiting.  Genitourinary: Negative for dysuria and urgency.  Musculoskeletal: Positive for back pain and joint pain. Negative for falls and myalgias.  Skin: Negative for itching and rash.  Neurological: Positive for weakness. Negative for dizziness, tingling, sensory change, loss of consciousness and headaches.  Endo/Heme/Allergies: Negative for environmental allergies. Does not bruise/bleed easily.  Psychiatric/Behavioral: Negative for depression. The patient is not nervous/anxious and does not have insomnia.      Current treatment-Keytruda  Allergies  Allergen Reactions   Clopidogrel Rash and Hives   Rosuvastatin Calcium Anaphylaxis   Etodolac Nausea And Vomiting and Other (See Comments)    Bad dreams Bad dreams    Hydrocodone Other (See Comments)    Bad dreams Bad dreams    Other    Statins    Meloxicam Rash    Bad dreams Bad dreams    Pravastatin Sodium Rash    Past Medical History:  Diagnosis Date   Cancer ([place]) 2013   Right Renal      Hyperlipidemia    Hypertension    Renal disorder    Urothelial carcinoma of bladder ([place]) 2020    Past Surgical History:  Procedure Laterality Date   CORONARY STENT INTERVENTION N/A 09/28/2018   Procedure: CORONARY STENT INTERVENTION;  Surgeon: [name], MD;  Location: [facility];  Service: Cardiovascular;  Laterality: N/A;   CORONARY STENT PLACEMENT     IR ANGIOGRAM SELECTIVE EACH ADDITIONAL VESSEL  07/13/2019   IR ANGIOGRAM SELECTIVE EACH ADDITIONAL VESSEL  07/13/2019   IR ANGIOGRAM SELECTIVE EACH ADDITIONAL VESSEL  07/13/2019   IR ANGIOGRAM SELECTIVE EACH ADDITIONAL VESSEL  07/13/2019   IR ANGIOGRAM SELECTIVE EACH ADDITIONAL VESSEL  07/13/2019   IR ANGIOGRAM SELECTIVE EACH ADDITIONAL VESSEL  07/13/2019   IR ANGIOGRAM SELECTIVE EACH ADDITIONAL VESSEL  07/27/2019   IR ANGIOGRAM SELECTIVE EACH ADDITIONAL VESSEL  07/27/2019   IR ANGIOGRAM VISCERAL SELECTIVE  07/13/2019   IR ANGIOGRAM VISCERAL SELECTIVE  07/27/2019   IR EMBO ARTERIAL NOT HEMORR HEMANG INC GUIDE ROADMAPPING  07/13/2019   IR EMBO TUMOR ORGAN ISCHEMIA INFARCT INC GUIDE ROADMAPPING  07/27/2019   IR FLUORO GUIDE CV LINE RIGHT  04/03/2018   IR RADIOLOGIST EVAL & MGMT  06/01/2019   IR RADIOLOGIST EVAL & MGMT  06/09/2019   IR US GUIDE VASC ACCESS RIGHT  07/13/2019   IR US GUIDE VASC ACCESS RIGHT  07/27/2019   kidney removed Left 2013   LEFT HEART CATH AND CORONARY ANGIOGRAPHY N/A  09/28/2018   Procedure: LEFT HEART CATH AND CORONARY ANGIOGRAPHY;  Surgeon: [name], MD;  Location: [facility];  Service: Cardiovascular;  Laterality: N/A;    Social History   Socioeconomic History   Marital status: Divorced    Spouse name: Not on file   Number of children: Not on file   Years of education: Not on file   Highest education level: Not on file  Occupational History   Not on file  Social Needs   Financial resource strain: Not on file   Food insecurity    Worry: Not  on file    Inability: Not on file   Transportation needs    Medical: Not on file    Non-medical: Not on file  Tobacco Use   Smoking status: Former Smoker    Packs/day: 1.00    Years: 55.00    Pack years: 55.00    Types: Cigarettes    Quit date: 03/20/2012    Years since quitting: 7.4   Smokeless tobacco: Never Used  Substance and Sexual Activity   Alcohol use: Yes    Comment: Social   Drug use: No   Sexual activity: Not on file  Lifestyle   Physical activity    Days per week: Not on file    Minutes per session: Not on file   Stress: Not on file  Relationships   Social connections    Talks on phone: Not on file    Gets together: Not on file    Attends religious service: Not on file    Active member of club or organization: Not on file    Attends meetings of clubs or organizations: Not on file    Relationship status: Not on file   Intimate partner violence    Fear of current or ex partner: Not on file    Emotionally abused: Not on file    Physically abused: Not on file    Forced sexual activity: Not on file  Other Topics Concern   Not on file  Social History Narrative   Not on file    Family History  Problem Relation Age of Onset   Coronary artery disease Mother    Anemia Mother    Kidney failure Mother    Subarachnoid hemorrhage Father    Breast cancer Sister 40   Hypertension Brother      Current Outpatient Medications:    ALPRAZolam (XANAX) 0.25 MG tablet, , Disp: , Rfl:    docusate sodium (COLACE) 100 MG capsule, Take 100 mg by mouth 2 (two) times daily as needed for mild constipation. 2 in am, 3 in pm, Disp: , Rfl:    fentaNYL (DURAGESIC) 25 MCG/HR, Place 1 patch onto the skin every 3 (three) days., Disp: , Rfl:    HYDROcodone-acetaminophen (NORCO/VICODIN) 5-325 MG tablet, Take 1 tablet by mouth every 6 (six) hours as needed for moderate pain., Disp: 120 tablet, Rfl: 0   megestrol (MEGACE) 20 MG tablet, Take 1 tablet (20 mg total) by  mouth daily., Disp: 30 tablet, Rfl: 1   Multiple Vitamin (MULTI-VITAMINS) TABS, Take by mouth., Disp: , Rfl:    ondansetron (ZOFRAN) 8 MG tablet, Take 1 tablet (8 mg total) by mouth every 8 (eight) hours as needed for nausea or vomiting., Disp: 30 tablet, Rfl: 3   polyethylene glycol (MIRALAX / GLYCOLAX) 17 g packet, Take 17 g by mouth 2 (two) times daily. , Disp: , Rfl:    prochlorperazine (COMPAZINE) 10 MG tablet,  Take 1 tablet (10 mg total) by mouth every 6 (six) hours as needed for nausea or vomiting., Disp: 30 tablet, Rfl: 0   sucralfate (CARAFATE) 1 g tablet, Take 1 tablet (1 g total) by mouth 3 (three) times daily., Disp: 90 tablet, Rfl: 3   traZODone (DESYREL) 50 MG tablet, Take 1-2 tablets (50-100 mg total) by mouth at bedtime., Disp: 30 tablet, Rfl: 0   amLODipine (NORVASC) 10 MG tablet, TAKE 1 TABLET BY MOUTH EVERY DAY, Disp: , Rfl:    aspirin EC 81 MG tablet, Take by mouth., Disp: , Rfl:    carvedilol (COREG) 6.25 MG tablet, Take 1 tablet (6.25 mg total) by mouth 2 (two) times daily. (Patient not taking: Reported on 08/16/2019), Disp: 60 tablet, Rfl: 6   clopidogrel (PLAVIX) 75 MG tablet, Take 75 mg by mouth daily., Disp: , Rfl:    ezetimibe (ZETIA) 10 MG tablet, TAKE 1 TABLET BY MOUTH DAILY (Patient not taking: Reported on 08/16/2019), Disp: 90 tablet, Rfl: 0   famotidine (PEPCID) 20 MG tablet, Take 20 mg by mouth 2 (two) times daily. , Disp: , Rfl:    Garlic 196 MG TABS, Take by mouth. , Disp: , Rfl:    magnesium hydroxide (MILK OF MAGNESIA) 400 MG/5ML suspension, Take 30 mLs by mouth., Disp: , Rfl:    metoCLOPramide (REGLAN) 10 MG tablet, Take 1 tablet (10 mg total) by mouth 4 (four) times daily. (Patient not taking: Reported on 08/16/2019), Disp: 30 tablet, Rfl: 1   nitroGLYCERIN (NITROSTAT) 0.4 MG SL tablet, Place under the tongue., Disp: , Rfl:    Omega-3 Fatty Acids (FISH OIL OMEGA-3) 1000 MG CAPS, Take by mouth daily., Disp: , Rfl:  No current facility-administered  medications for this visit.   Facility-Administered Medications Ordered in Other Visits:    sodium chloride flush (NS) 0.9 % injection 10 mL, 10 mL, Intravenous, PRN, [name], MD, 10 mL at 10/05/18 1026  Physical exam:  Vitals:   08/16/19 1019 08/16/19 1021  BP:  126/75  Pulse:  (!) 113  Resp:  18  TempSrc:  Tympanic  SpO2:  98%  Weight: 167 lb 6.4 oz (75.9 kg)    Physical Exam Constitutional:      General: He is not in acute distress.    Appearance: He is well-developed and normal weight.     Comments: Accompanied by daughter.  Wearing mask.  HENT:     Head: Normocephalic and atraumatic.     Nose: Nose normal.     Mouth/Throat:     Pharynx: No oropharyngeal exudate.  Eyes:     General: No scleral icterus.    Conjunctiva/sclera: Conjunctivae normal.  Neck:     Musculoskeletal: Normal range of motion and neck supple. No neck rigidity.  Cardiovascular:     Rate and Rhythm: Normal rate and regular rhythm.     Pulses: Normal pulses.     Heart sounds: Normal heart sounds.  Pulmonary:     Effort: Pulmonary effort is normal.     Breath sounds: Normal breath sounds. No wheezing.  Abdominal:     General: Bowel sounds are normal. There is distension.     Palpations: There is no mass.     Tenderness: There is abdominal tenderness. There is no right CVA tenderness, left CVA tenderness, guarding or rebound.  Musculoskeletal: Normal range of motion.     Right lower leg: Edema (2+) present.     Left lower leg: Edema (2+) present.  Comments: Pain reproducible on right inferior scapula  Lymphadenopathy:     Cervical: No cervical adenopathy.  Skin:    General: Skin is warm and dry.     Coloration: Skin is not jaundiced.  Neurological:     Mental Status: He is alert and oriented to person, place, and time.     Motor: No weakness.  Psychiatric:        Mood and Affect: Mood normal.        Behavior: Behavior normal.      CMP Latest Ref Rng & Units 08/16/2019    Glucose 70 - 99 mg/dL 77  BUN 8 - 23 mg/dL 19  Creatinine 0.61 - 1.24 mg/dL 0.90  Sodium 135 - 145 mmol/L 131(L)  Potassium 3.5 - 5.1 mmol/L 4.7  Chloride 98 - 111 mmol/L 104  CO2 22 - 32 mmol/L 17(L)  Calcium 8.9 - 10.3 mg/dL 9.5  Total Protein 6.5 - 8.1 g/dL 6.4(L)  Total Bilirubin 0.3 - 1.2 mg/dL 1.0  Alkaline Phos 38 - 126 U/L 434(H)  AST 15 - 41 U/L 84(H)  ALT 0 - 44 U/L 56(H)   CBC Latest Ref Rng & Units 08/16/2019  WBC 4.0 - 10.5 K/uL 11.0(H)  Hemoglobin 13.0 - 17.0 g/dL 10.8(L)  Hematocrit 39.0 - 52.0 % 34.4(L)  Platelets 150 - 400 K/uL 128(L)    No images are attached to the encounter.  Nm Liver Tumor Loc Imflam Spect 1 Day  Result Date: 07/27/2019 CLINICAL DATA:  Unresectable liver metastasis. Bladder carcinoma primary. First treatment to the RIGHT hepatic lobe. EXAM: NUCLEAR MEDICINE SPECIAL MED RAD PHYSICS CONS; NUCLEAR MEDICINE RADIO PHARM THERAPY INTRA ARTERIAL; NUCLEAR MEDICINE TREATMENT PROCEDURE; NUCLEAR MEDICINE LIVER SCAN TECHNIQUE: In conjunction with the interventional radiologist a Y- Microsphere dose was calculated utilizing body surface area formulation. Calculated dose equal 28.2 mCi. Pre therapy MAA liver SPECT scan and CTA were evaluated. Utilizing a microcatheter system, the RIGHT hepatic artery was selected and Y-90 microspheres were delivered in fractionated aliquots. Radiopharmaceutical was delivered by the interventional radiologist and nuclear radiologist. The patient tolerated procedure well. No adverse effects were noted. Bremsstrahlung planar and SPECT imaging of the abdomen following intrahepatic arterial delivery of Y-90 microsphere was performed. RADIOPHARMACEUTICALS:  01.7 millicuries yttrium 90 microspheres. COMPARISON:  PET-CT 06/17/2019, MRI 06/03/2019 FINDINGS: Y - 90 microspheres therapy as above. First therapy the right hepatic lobe. Bremsstrahlung planar and SPECT imaging of the abdomen following intrahepatic arterial delivery of  Y-27mcrosphere demonstrates radioactivity localized to the RIGHT hepatic lobe. No evidence of extrahepatic activity. IMPRESSION: Successful Y - 90 microsphere delivery for treatment of unresectable liver metastasis. First therapy to the RIGHT lobe. Bremssstrahlung scan demonstrates activity localized to RIGHT hepatic lobe with no extrahepatic activity identified. Electronically Signed   By: SSuzy BouchardM.D.   On: 07/27/2019 12:57   Ir Angiogram Visceral Selective  Result Date: 07/27/2019 INDICATION: History of metastatic bladder cancer. Patient presents today for Y 90 radioembolization of the right lobe of the liver Note, pre Y 90 mapping radioembolization demonstrated a right gastric artery however this cannot be successfully coil embolized. As such will also attempt repeat coil embolization of the right gastric artery. EXAM: 1. ULTRASOUND GUIDANCE FOR ARTERIAL ACCESS 2. CELIAC ARTERIOGRAM 3. RIGHT GASTRIC ARTERIOGRAM 4. RIGHT HEPATIC AND RADIOEMBOLIZATION OF THE RIGHT LOBE OF THE LIVER 5. FLUOROSCOPIC GUIDED ADMINISTRATION OF SIR-SPHERES COMPARISON:  Mapping Y 90 radioembolization-07/13/2019; PET-CT-06/17/2019; CT abdomen pelvis-05/24/2019; abdominal MRI - 06/03/2019 MEDICATIONS: Protonix 40 mg IV; Decadron 20 mg IV; Toradol  30 mg IV; Zofran 4 mg IV CONTRAST:  80 cc OMNIPAQUE IOHEXOL 300 MG/ML  SOLN ANESTHESIA/SEDATION: Moderate (conscious) sedation was employed during this procedure. A total of Versed 3 mg and Fentanyl 200 mcg was administered intravenously. Moderate Sedation Time: 70 minutes. The patient's level of consciousness and vital signs were monitored continuously by radiology nursing throughout the procedure under my direct supervision. FLUOROSCOPY TIME:  15 minutes, 30 seconds (1,208 mGy) ACCESS: Right common femoral artery; hemostasis achieved with manual compression. COMPLICATIONS: None immediate. TECHNIQUE: Informed written consent was obtained from the patient after a discussion of the  risks, benefits and alternatives to treatment. Questions regarding the procedure were encouraged and answered. A timeout was performed prior to the initiation of the procedure. The right groin was prepped and draped in the usual sterile fashion, and a sterile drape was applied covering the operative field. Maximum barrier sterile technique with sterile gowns and gloves were used for the procedure. A timeout was performed prior to the initiation of the procedure. Local anesthesia was provided with 1% lidocaine. The right femoral head was marked fluoroscopically. Under direct ultrasound guidance, the right common femoral artery was accessed with a micropuncture kit allowing placement of a of 5-French vascular sheath. An ultrasound image was saved for documentation purposes. A limited arteriogram performed through the side arm of the sheath confirming appropriate access within the right common femoral artery. Over a Britta Mccreedy wire, a Mickelson catheter was advanced to the level of the inferior thoracic aorta where it was reformed, back bled and flushed. The Mickelson catheter was utilized to select the celiac artery and a celiac arteriogram was performed. Next, with the use of a fathom 14 microwire, a regular Renegade microcatheter was utilized to select the right gastric artery. Despite successful cannulation of the origin the right gastric artery and advancement of a microwire to its mid aspect, the microcatheter would not track beyond the origin the vessel secondary to tortuosity of the vessel's origin as well as it's small caliber. Next, the microcatheter was advanced to the level of the right hepatic artery and a selective right hepatic arteriogram was performed. Appropriate position was confirmed and the Radioembolization was then performed with Yttrium-90 SIR Spheres. Particles were administered via a microcatheter utilizing a completely enclosed system. Monitoring of antegrade flow was performed during and after  the administration under fluoroscopy with use of contrast intermittently. After the administration of the particles, the microcatheter, outer catheter and administration system were then discarded into radiation safety receptacle. At this point, the procedure was terminated. The right common femoral approach vascular sheath was removed and hemostasis was achieved with manual compression. A dressing was placed. The patient tolerated procedure well without immediate postprocedural complication. All participants involved with the procedure were surveyed by the radiation safety officer prior to exiting the fluoroscopy suite. The patient was escorted to the nuclear medicine department for post-treatment imaging. FINDINGS: Selective celiac arteriogram demonstrates complete embolization of the gastroduodenal artery without residual flow. Selective right gastric arteriogram demonstrates patency and tortuosity/narrowing of the origin and proximal aspect. The origin of the right gastric artery was again noted to be adjacent to the dominant left hepatic artery. Despite catheterization of the origin of the vessel and advancement of a microwire to its mid aspect, the microcatheter could not be advanced beyond the origin to allow for safe percutaneous coil embolization. 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 The microcatheter  was placed proximal to the origin of the accessory right hepatic artery and Y 90 radioembolization was performed from this location. Intermittent contrast injections during and after the radioembolization confirming preserved antegrade flow without reflux. IMPRESSION: 1. Technically successful radioembolization of the right lobe of the liver with Yttrium-90 microspheres. 2. Attempted though unsuccessful coil embolization of the right gastric artery secondary to tortuosity of the vessel's  origin and small caliber of the vessel. PLAN: - Given inability to successfully percutaneously coil embolized the right gastric artery, Y 90 radioembolization of left lobe of the liver will NOT be pursued given proximity of the right gastric artery to the origin of the left hepatic artery and concern for nontarget embolization in this patient with progressive/advanced intra and extrahepatic metastatic disease. - The patient will be seen in follow-up consultation in the IR clinic in 3-4 weeks following acquisition of postprocedural CMP. - initial surveillance imaging will be performed in 3 months (likely with PET-CT). Electronically Signed   By: Sandi Mariscal M.D.   On: 07/27/2019 13:14   Ir Angiogram Selective Each Additional Vessel  Result Date: 07/27/2019 INDICATION: History of metastatic bladder cancer. Patient presents today for Y 90 radioembolization of the right lobe of the liver Note, pre Y 90 mapping radioembolization demonstrated a right gastric artery however this cannot be successfully coil embolized. As such will also attempt repeat coil embolization of the right gastric artery. EXAM: 1. ULTRASOUND GUIDANCE FOR ARTERIAL ACCESS 2. CELIAC ARTERIOGRAM 3. RIGHT GASTRIC ARTERIOGRAM 4. RIGHT HEPATIC AND RADIOEMBOLIZATION OF THE RIGHT LOBE OF THE LIVER 5. FLUOROSCOPIC GUIDED ADMINISTRATION OF SIR-SPHERES COMPARISON:  Mapping Y 90 radioembolization-07/13/2019; PET-CT-06/17/2019; CT abdomen pelvis-05/24/2019; abdominal MRI - 06/03/2019 MEDICATIONS: Protonix 40 mg IV; Decadron 20 mg IV; Toradol 30 mg IV; Zofran 4 mg IV CONTRAST:  80 cc OMNIPAQUE IOHEXOL 300 MG/ML  SOLN ANESTHESIA/SEDATION: Moderate (conscious) sedation was employed during this procedure. A total of Versed 3 mg and Fentanyl 200 mcg was administered intravenously. Moderate Sedation Time: 70 minutes. The patient's level of consciousness and vital signs were monitored continuously by radiology nursing throughout the procedure under my direct  supervision. FLUOROSCOPY TIME:  15 minutes, 30 seconds (1,208 mGy) ACCESS: Right common femoral artery; hemostasis achieved with manual compression. COMPLICATIONS: None immediate. TECHNIQUE: Informed written consent was obtained from the patient after a discussion of the risks, benefits and alternatives to treatment. Questions regarding the procedure were encouraged and answered. A timeout was performed prior to the initiation of the procedure. The right groin was prepped and draped in the usual sterile fashion, and a sterile drape was applied covering the operative field. Maximum barrier sterile technique with sterile gowns and gloves were used for the procedure. A timeout was performed prior to the initiation of the procedure. Local anesthesia was provided with 1% lidocaine. The right femoral head was marked fluoroscopically. Under direct ultrasound guidance, the right common femoral artery was accessed with a micropuncture kit allowing placement of a of 5-French vascular sheath. An ultrasound image was saved for documentation purposes. A limited arteriogram performed through the side arm of the sheath confirming appropriate access within the right common femoral artery. Over a Britta Mccreedy wire, a Mickelson catheter was advanced to the level of the inferior thoracic aorta where it was reformed, back bled and flushed. The Mickelson catheter was utilized to select the celiac artery and a celiac arteriogram was performed. Next, with the use of a fathom 14 microwire, a regular Renegade microcatheter was utilized to select the right gastric artery. Despite  successful cannulation of the origin the right gastric artery and advancement of a microwire to its mid aspect, the microcatheter would not track beyond the origin the vessel secondary to tortuosity of the vessel's origin as well as it's small caliber. Next, the microcatheter was advanced to the level of the right hepatic artery and a selective right hepatic arteriogram  was performed. Appropriate position was confirmed and the Radioembolization was then performed with Yttrium-90 SIR Spheres. Particles were administered via a microcatheter utilizing a completely enclosed system. Monitoring of antegrade flow was performed during and after the administration under fluoroscopy with use of contrast intermittently. After the administration of the particles, the microcatheter, outer catheter and administration system were then discarded into radiation safety receptacle. At this point, the procedure was terminated. The right common femoral approach vascular sheath was removed and hemostasis was achieved with manual compression. A dressing was placed. The patient tolerated procedure well without immediate postprocedural complication. All participants involved with the procedure were surveyed by the radiation safety officer prior to exiting the fluoroscopy suite. The patient was escorted to the nuclear medicine department for post-treatment imaging. FINDINGS: Selective celiac arteriogram demonstrates complete embolization of the gastroduodenal artery without residual flow. Selective right gastric arteriogram demonstrates patency and tortuosity/narrowing of the origin and proximal aspect. The origin of the right gastric artery was again noted to be adjacent to the dominant left hepatic artery. Despite catheterization of the origin of the vessel and advancement of a microwire to its mid aspect, the microcatheter could not be advanced beyond the origin to allow for safe percutaneous coil embolization. 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 The microcatheter was placed proximal to the origin of the accessory right hepatic artery and Y 90 radioembolization was performed from this location. Intermittent contrast injections during and after the radioembolization confirming  preserved antegrade flow without reflux. IMPRESSION: 1. Technically successful radioembolization of the right lobe of the liver with Yttrium-90 microspheres. 2. Attempted though unsuccessful coil embolization of the right gastric artery secondary to tortuosity of the vessel's origin and small caliber of the vessel. PLAN: - Given inability to successfully percutaneously coil embolized the right gastric artery, Y 90 radioembolization of left lobe of the liver will NOT be pursued given proximity of the right gastric artery to the origin of the left hepatic artery and concern for nontarget embolization in this patient with progressive/advanced intra and extrahepatic metastatic disease. - The patient will be seen in follow-up consultation in the IR clinic in 3-4 weeks following acquisition of postprocedural CMP. - initial surveillance imaging will be performed in 3 months (likely with PET-CT). Electronically Signed   By: Sandi Mariscal M.D.   On: 07/27/2019 13:14   Ir Angiogram Selective Each Additional Vessel  Result Date: 07/27/2019 INDICATION: History of metastatic bladder cancer. Patient presents today for Y 90 radioembolization of the right lobe of the liver Note, pre Y 90 mapping radioembolization demonstrated a right gastric artery however this cannot be successfully coil embolized. As such will also attempt repeat coil embolization of the right gastric artery. EXAM: 1. ULTRASOUND GUIDANCE FOR ARTERIAL ACCESS 2. CELIAC ARTERIOGRAM 3. RIGHT GASTRIC ARTERIOGRAM 4. RIGHT HEPATIC AND RADIOEMBOLIZATION OF THE RIGHT LOBE OF THE LIVER 5. FLUOROSCOPIC GUIDED ADMINISTRATION OF SIR-SPHERES COMPARISON:  Mapping Y 90 radioembolization-07/13/2019; PET-CT-06/17/2019; CT abdomen pelvis-05/24/2019; abdominal MRI - 06/03/2019 MEDICATIONS: Protonix 40 mg IV; Decadron 20 mg IV; Toradol 30 mg IV; Zofran  4 mg IV CONTRAST:  80 cc OMNIPAQUE IOHEXOL 300 MG/ML  SOLN ANESTHESIA/SEDATION: Moderate (conscious) sedation was employed during  this procedure. A total of Versed 3 mg and Fentanyl 200 mcg was administered intravenously. Moderate Sedation Time: 70 minutes. The patient's level of consciousness and vital signs were monitored continuously by radiology nursing throughout the procedure under my direct supervision. FLUOROSCOPY TIME:  15 minutes, 30 seconds (1,208 mGy) ACCESS: Right common femoral artery; hemostasis achieved with manual compression. COMPLICATIONS: None immediate. TECHNIQUE: Informed written consent was obtained from the patient after a discussion of the risks, benefits and alternatives to treatment. Questions regarding the procedure were encouraged and answered. A timeout was performed prior to the initiation of the procedure. The right groin was prepped and draped in the usual sterile fashion, and a sterile drape was applied covering the operative field. Maximum barrier sterile technique with sterile gowns and gloves were used for the procedure. A timeout was performed prior to the initiation of the procedure. Local anesthesia was provided with 1% lidocaine. The right femoral head was marked fluoroscopically. Under direct ultrasound guidance, the right common femoral artery was accessed with a micropuncture kit allowing placement of a of 5-French vascular sheath. An ultrasound image was saved for documentation purposes. A limited arteriogram performed through the side arm of the sheath confirming appropriate access within the right common femoral artery. Over a Britta Mccreedy wire, a Mickelson catheter was advanced to the level of the inferior thoracic aorta where it was reformed, back bled and flushed. The Mickelson catheter was utilized to select the celiac artery and a celiac arteriogram was performed. Next, with the use of a fathom 14 microwire, a regular Renegade microcatheter was utilized to select the right gastric artery. Despite successful cannulation of the origin the right gastric artery and advancement of a microwire to its mid  aspect, the microcatheter would not track beyond the origin the vessel secondary to tortuosity of the vessel's origin as well as it's small caliber. Next, the microcatheter was advanced to the level of the right hepatic artery and a selective right hepatic arteriogram was performed. Appropriate position was confirmed and the Radioembolization was then performed with Yttrium-90 SIR Spheres. Particles were administered via a microcatheter utilizing a completely enclosed system. Monitoring of antegrade flow was performed during and after the administration under fluoroscopy with use of contrast intermittently. After the administration of the particles, the microcatheter, outer catheter and administration system were then discarded into radiation safety receptacle. At this point, the procedure was terminated. The right common femoral approach vascular sheath was removed and hemostasis was achieved with manual compression. A dressing was placed. The patient tolerated procedure well without immediate postprocedural complication. All participants involved with the procedure were surveyed by the radiation safety officer prior to exiting the fluoroscopy suite. The patient was escorted to the nuclear medicine department for post-treatment imaging. FINDINGS: Selective celiac arteriogram demonstrates complete embolization of the gastroduodenal artery without residual flow. Selective right gastric arteriogram demonstrates patency and tortuosity/narrowing of the origin and proximal aspect. The origin of the right gastric artery was again noted to be adjacent to the dominant left hepatic artery. Despite catheterization of the origin of the vessel and advancement of a microwire to its mid aspect, the microcatheter could not be advanced beyond the origin to allow for safe percutaneous coil embolization. Selective right hepatic arteriogram demonstrates an accessory anterior division of the right hepatic artery arising proximal to the  mid division of the vessel. The microcatheter was placed proximal  to the origin of the accessory right hepatic artery and Y 90 radioembolization was performed from this location. Intermittent contrast injections during and after the radioembolization confirming preserved antegrade flow without reflux. IMPRESSION: 1. Technically successful radioembolization of the right lobe of the liver with Yttrium-90 microspheres. 2. Attempted though unsuccessful coil embolization of the right gastric artery secondary to tortuosity of the vessel's origin and small caliber of the vessel. PLAN: - Given inability to successfully percutaneously coil embolized the right gastric artery, Y 90 radioembolization of left lobe of the liver will NOT be pursued given proximity of the right gastric artery to the origin of the left hepatic artery and concern for nontarget embolization in this patient with progressive/advanced intra and extrahepatic metastatic disease. - The patient will be seen in follow-up consultation in the IR clinic in 3-4 weeks following acquisition of postprocedural CMP. - initial surveillance imaging will be performed in 3 months (likely with PET-CT). Electronically Signed   By: Sandi Mariscal M.D.   On: 07/27/2019 13:14   Nm Special Med Rad Physics Cons  Result Date: 07/27/2019 CLINICAL DATA:  Unresectable liver metastasis. Bladder carcinoma primary. First treatment to the RIGHT hepatic lobe. EXAM: NUCLEAR MEDICINE SPECIAL MED RAD PHYSICS CONS; NUCLEAR MEDICINE RADIO PHARM THERAPY INTRA ARTERIAL; NUCLEAR MEDICINE TREATMENT PROCEDURE; NUCLEAR MEDICINE LIVER SCAN TECHNIQUE: In conjunction with the interventional radiologist a Y- Microsphere dose was calculated utilizing body surface area formulation. Calculated dose equal 28.2 mCi. Pre therapy MAA liver SPECT scan and CTA were evaluated. Utilizing a microcatheter system, the RIGHT hepatic artery was selected and Y-90 microspheres were delivered in fractionated aliquots.  Radiopharmaceutical was delivered by the interventional radiologist and nuclear radiologist. The patient tolerated procedure well. No adverse effects were noted. Bremsstrahlung planar and SPECT imaging of the abdomen following intrahepatic arterial delivery of Y-90 microsphere was performed. RADIOPHARMACEUTICALS:  09.4 millicuries yttrium 90 microspheres. COMPARISON:  PET-CT 06/17/2019, MRI 06/03/2019 FINDINGS: Y - 90 microspheres therapy as above. First therapy the right hepatic lobe. Bremsstrahlung planar and SPECT imaging of the abdomen following intrahepatic arterial delivery of [id number] demonstrates radioactivity localized to the RIGHT hepatic lobe. No evidence of extrahepatic activity. IMPRESSION: Successful Y - 90 microsphere delivery for treatment of unresectable liver metastasis. First therapy to the RIGHT lobe. Bremssstrahlung scan demonstrates activity localized to RIGHT hepatic lobe with no extrahepatic activity identified. Electronically Signed   By: SSuzy BouchardM.D.   On: 07/27/2019 12:57   Nm Special Treatment Procedure  Result Date: 07/27/2019 CLINICAL DATA:  Unresectable liver metastasis. Bladder carcinoma primary. First treatment to the RIGHT hepatic lobe. EXAM: NUCLEAR MEDICINE SPECIAL MED RAD PHYSICS CONS; NUCLEAR MEDICINE RADIO PHARM THERAPY INTRA ARTERIAL; NUCLEAR MEDICINE TREATMENT PROCEDURE; NUCLEAR MEDICINE LIVER SCAN TECHNIQUE: In conjunction with the interventional radiologist a Y- Microsphere dose was calculated utilizing body surface area formulation. Calculated dose equal 28.2 mCi. Pre therapy MAA liver SPECT scan and CTA were evaluated. Utilizing a microcatheter system, the RIGHT hepatic artery was selected and Y-90 microspheres were delivered in fractionated aliquots. Radiopharmaceutical was delivered by the interventional radiologist and nuclear radiologist. The patient tolerated procedure well. No adverse effects were noted. Bremsstrahlung planar and SPECT imaging  of the abdomen following intrahepatic arterial delivery of Y-90 microsphere was performed. RADIOPHARMACEUTICALS:  270.9millicuries yttrium 90 microspheres. COMPARISON:  PET-CT 06/17/2019, MRI 06/03/2019 FINDINGS: Y - 90 microspheres therapy as above. First therapy the right hepatic lobe. Bremsstrahlung planar and SPECT imaging of the abdomen following intrahepatic arterial delivery of [id number] demonstrates radioactivity localized to the  RIGHT hepatic lobe. No evidence of extrahepatic activity. IMPRESSION: Successful Y - 90 microsphere delivery for treatment of unresectable liver metastasis. First therapy to the RIGHT lobe. Bremssstrahlung scan demonstrates activity localized to RIGHT hepatic lobe with no extrahepatic activity identified. Electronically Signed   By: Suzy Bouchard M.D.   On: 07/27/2019 12:57   Ir US Guide Vasc Access Right  Result Date: 07/27/2019 INDICATION: History of metastatic bladder cancer. Patient presents today for Y 90 radioembolization of the right lobe of the liver Note, pre Y 90 mapping radioembolization demonstrated a right gastric artery however this cannot be successfully coil embolized. As such will also attempt repeat coil embolization of the right gastric artery. EXAM: 1. ULTRASOUND GUIDANCE FOR ARTERIAL ACCESS 2. CELIAC ARTERIOGRAM 3. RIGHT GASTRIC ARTERIOGRAM 4. RIGHT HEPATIC AND RADIOEMBOLIZATION OF THE RIGHT LOBE OF THE LIVER 5. FLUOROSCOPIC GUIDED ADMINISTRATION OF SIR-SPHERES COMPARISON:  Mapping Y 90 radioembolization-07/13/2019; PET-CT-06/17/2019; CT abdomen pelvis-05/24/2019; abdominal MRI - 06/03/2019 MEDICATIONS: Protonix 40 mg IV; Decadron 20 mg IV; Toradol 30 mg IV; Zofran 4 mg IV CONTRAST:  80 cc OMNIPAQUE IOHEXOL 300 MG/ML  SOLN ANESTHESIA/SEDATION: Moderate (conscious) sedation was employed during this procedure. A total of Versed 3 mg and Fentanyl 200 mcg was administered intravenously. Moderate Sedation Time: 70 minutes. The patient's level of  consciousness and vital signs were monitored continuously by radiology nursing throughout the procedure under my direct supervision. FLUOROSCOPY TIME:  15 minutes, 30 seconds (1,208 mGy) ACCESS: Right common femoral artery; hemostasis achieved with manual compression. COMPLICATIONS: None immediate. TECHNIQUE: Informed written consent was obtained from the patient after a discussion of the risks, benefits and alternatives to treatment. Questions regarding the procedure were encouraged and answered. A timeout was performed prior to the initiation of the procedure. The right groin was prepped and draped in the usual sterile fashion, and a sterile drape was applied covering the operative field. Maximum barrier sterile technique with sterile gowns and gloves were used for the procedure. A timeout was performed prior to the initiation of the procedure. Local anesthesia was provided with 1% lidocaine. The right femoral head was marked fluoroscopically. Under direct ultrasound guidance, the right common femoral artery was accessed with a micropuncture kit allowing placement of a of 5-French vascular sheath. An ultrasound image was saved for documentation purposes. A limited arteriogram performed through the side arm of the sheath confirming appropriate access within the right common femoral artery. Over a Britta Mccreedy wire, a Mickelson catheter was advanced to the level of the inferior thoracic aorta where it was reformed, back bled and flushed. The Mickelson catheter was utilized to select the celiac artery and a celiac arteriogram was performed. Next, with the use of a fathom 14 microwire, a regular Renegade microcatheter was utilized to select the right gastric artery. Despite successful cannulation of the origin the right gastric artery and advancement of a microwire to its mid aspect, the microcatheter would not track beyond the origin the vessel secondary to tortuosity of the vessel's origin as well as it's small caliber.  Next, the microcatheter was advanced to the level of the right hepatic artery and a selective right hepatic arteriogram was performed. Appropriate position was confirmed and the Radioembolization was then performed with Yttrium-90 SIR Spheres. Particles were administered via a microcatheter utilizing a completely enclosed system. Monitoring of antegrade flow was performed during and after the administration under fluoroscopy with use of contrast intermittently. After the administration of the particles, the microcatheter, outer catheter and administration system were then discarded into radiation  safety receptacle. At this point, the procedure was terminated. The right common femoral approach vascular sheath was removed and hemostasis was achieved with manual compression. A dressing was placed. The patient tolerated procedure well without immediate postprocedural complication. All participants involved with the procedure were surveyed by the radiation safety officer prior to exiting the fluoroscopy suite. The patient was escorted to the nuclear medicine department for post-treatment imaging. FINDINGS: Selective celiac arteriogram demonstrates complete embolization of the gastroduodenal artery without residual flow. Selective right gastric arteriogram demonstrates patency and tortuosity/narrowing of the origin and proximal aspect. The origin of the right gastric artery was again noted to be adjacent to the dominant left hepatic artery. Despite catheterization of the origin of the vessel and advancement of a microwire to its mid aspect, the microcatheter could not be advanced beyond the origin to allow for safe percutaneous coil embolization. 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 The microcatheter was placed proximal to the origin of the accessory right hepatic artery and Y 90 radioembolization was  performed from this location. Intermittent contrast injections during and after the radioembolization confirming preserved antegrade flow without reflux. IMPRESSION: 1. Technically successful radioembolization of the right lobe of the liver with Yttrium-90 microspheres. 2. Attempted though unsuccessful coil embolization of the right gastric artery secondary to tortuosity of the vessel's origin and small caliber of the vessel. PLAN: - Given inability to successfully percutaneously coil embolized the right gastric artery, Y 90 radioembolization of left lobe of the liver will NOT be pursued given proximity of the right gastric artery to the origin of the left hepatic artery and concern for nontarget embolization in this patient with progressive/advanced intra and extrahepatic metastatic disease. - The patient will be seen in follow-up consultation in the IR clinic in 3-4 weeks following acquisition of postprocedural CMP. - initial surveillance imaging will be performed in 3 months (likely with PET-CT). Electronically Signed   By: Sandi Mariscal M.D.   On: 07/27/2019 13:14   Dg Abd 2 Views  Result Date: 08/16/2019 CLINICAL DATA:  Worsening abdominal pain and constipation. History of right nephrectomy for renal cell carcinoma. Recent Y 90 procedure. EXAM: ABDOMEN - 2 VIEW COMPARISON:  PET-CT 06/17/2019 FINDINGS: The lung bases are clear. No worrisome pulmonary lesions at the lung bases and no pleural effusions. There is some scattered stool in the colon but no findings for constipation. Paucity of small bowel gas. No findings for obstruction or perforation. The soft tissue shadows are grossly maintained. No worrisome calcifications. Arterial coils are noted in the central abdomen. Penile prosthesis is noted. The bony structures are unremarkable. No worrisome bone lesions. IMPRESSION: No plain film findings for an acute abdominal process. No findings for constipation or obstruction. Electronically Signed   By: Marijo Sanes M.D.   On: 08/16/2019 12:43   Ir Embo Tumor Organ Ischemia Infarct Inc Guide Roadmapping  Result Date: 07/27/2019 INDICATION: History of metastatic bladder cancer. Patient presents today for Y 90 radioembolization of the right lobe of the liver Note, pre Y 90 mapping radioembolization demonstrated a right gastric artery however this cannot be successfully coil embolized. As such will also attempt repeat coil embolization of the right gastric artery. EXAM: 1. ULTRASOUND GUIDANCE FOR ARTERIAL ACCESS 2. CELIAC ARTERIOGRAM 3. RIGHT GASTRIC ARTERIOGRAM 4. RIGHT HEPATIC AND RADIOEMBOLIZATION OF THE RIGHT LOBE OF THE LIVER 5. FLUOROSCOPIC GUIDED ADMINISTRATION OF SIR-SPHERES COMPARISON:  Mapping Y 90 radioembolization-07/13/2019;  PET-CT-06/17/2019; CT abdomen pelvis-05/24/2019; abdominal MRI - 06/03/2019 MEDICATIONS: Protonix 40 mg IV; Decadron 20 mg IV; Toradol 30 mg IV; Zofran 4 mg IV CONTRAST:  80 cc OMNIPAQUE IOHEXOL 300 MG/ML  SOLN ANESTHESIA/SEDATION: Moderate (conscious) sedation was employed during this procedure. A total of Versed 3 mg and Fentanyl 200 mcg was administered intravenously. Moderate Sedation Time: 70 minutes. The patient's level of consciousness and vital signs were monitored continuously by radiology nursing throughout the procedure under my direct supervision. FLUOROSCOPY TIME:  15 minutes, 30 seconds (1,208 mGy) ACCESS: Right common femoral artery; hemostasis achieved with manual compression. COMPLICATIONS: None immediate. TECHNIQUE: Informed written consent was obtained from the patient after a discussion of the risks, benefits and alternatives to treatment. Questions regarding the procedure were encouraged and answered. A timeout was performed prior to the initiation of the procedure. The right groin was prepped and draped in the usual sterile fashion, and a sterile drape was applied covering the operative field. Maximum barrier sterile technique with sterile gowns and gloves were  used for the procedure. A timeout was performed prior to the initiation of the procedure. Local anesthesia was provided with 1% lidocaine. The right femoral head was marked fluoroscopically. Under direct ultrasound guidance, the right common femoral artery was accessed with a micropuncture kit allowing placement of a of 5-French vascular sheath. An ultrasound image was saved for documentation purposes. A limited arteriogram performed through the side arm of the sheath confirming appropriate access within the right common femoral artery. Over a Britta Mccreedy wire, a Mickelson catheter was advanced to the level of the inferior thoracic aorta where it was reformed, back bled and flushed. The Mickelson catheter was utilized to select the celiac artery and a celiac arteriogram was performed. Next, with the use of a fathom 14 microwire, a regular Renegade microcatheter was utilized to select the right gastric artery. Despite successful cannulation of the origin the right gastric artery and advancement of a microwire to its mid aspect, the microcatheter would not track beyond the origin the vessel secondary to tortuosity of the vessel's origin as well as it's small caliber. Next, the microcatheter was advanced to the level of the right hepatic artery and a selective right hepatic arteriogram was performed. Appropriate position was confirmed and the Radioembolization was then performed with Yttrium-90 SIR Spheres. Particles were administered via a microcatheter utilizing a completely enclosed system. Monitoring of antegrade flow was performed during and after the administration under fluoroscopy with use of contrast intermittently. After the administration of the particles, the microcatheter, outer catheter and administration system were then discarded into radiation safety receptacle. At this point, the procedure was terminated. The right common femoral approach vascular sheath was removed and hemostasis was achieved with manual  compression. A dressing was placed. The patient tolerated procedure well without immediate postprocedural complication. All participants involved with the procedure were surveyed by the radiation safety officer prior to exiting the fluoroscopy suite. The patient was escorted to the nuclear medicine department for post-treatment imaging. FINDINGS: Selective celiac arteriogram demonstrates complete embolization of the gastroduodenal artery without residual flow. Selective right gastric arteriogram demonstrates patency and tortuosity/narrowing of the origin and proximal aspect. The origin of the right gastric artery was again noted to be adjacent to the dominant left hepatic artery. Despite catheterization of the origin of the vessel and advancement of a microwire to its mid aspect, the microcatheter could not be advanced beyond the origin to allow for safe percutaneous coil embolization. Selective right hepatic arteriogram demonstrates an accessory  anterior division of the right hepatic artery arising proximal to the mid division of the vessel. The microcatheter was placed proximal to the origin of the accessory right hepatic artery and Y 90 radioembolization was performed from this location. Intermittent contrast injections during and after the radioembolization confirming preserved antegrade flow without reflux. IMPRESSION: 1. Technically successful radioembolization of the right lobe of the liver with Yttrium-90 microspheres. 2. Attempted though unsuccessful coil embolization of the right gastric artery secondary to tortuosity of the vessel's origin and small caliber of the vessel. PLAN: - Given inability to successfully percutaneously coil embolized the right gastric artery, Y 90 radioembolization of left lobe of the liver will NOT be pursued given proximity of the right gastric artery to the origin of the left hepatic artery and concern for nontarget embolization in this patient with progressive/advanced intra  and extrahepatic metastatic disease. - The patient will be seen in follow-up consultation in the IR clinic in 3-4 weeks following acquisition of postprocedural CMP. - initial surveillance imaging will be performed in 3 months (likely with PET-CT). Electronically Signed   By: Sandi Mariscal M.D.   On: 07/27/2019 13:14   Nm Radio Pharm Therapy Intraarterial  Result Date: 07/27/2019 CLINICAL DATA:  Unresectable liver metastasis. Bladder carcinoma primary. First treatment to the RIGHT hepatic lobe. EXAM: NUCLEAR MEDICINE SPECIAL MED RAD PHYSICS CONS; NUCLEAR MEDICINE RADIO PHARM THERAPY INTRA ARTERIAL; NUCLEAR MEDICINE TREATMENT PROCEDURE; NUCLEAR MEDICINE LIVER SCAN TECHNIQUE: In conjunction with the interventional radiologist a Y- Microsphere dose was calculated utilizing body surface area formulation. Calculated dose equal 28.2 mCi. Pre therapy MAA liver SPECT scan and CTA were evaluated. Utilizing a microcatheter system, the RIGHT hepatic artery was selected and Y-90 microspheres were delivered in fractionated aliquots. Radiopharmaceutical was delivered by the interventional radiologist and nuclear radiologist. The patient tolerated procedure well. No adverse effects were noted. Bremsstrahlung planar and SPECT imaging of the abdomen following intrahepatic arterial delivery of Y-90 microsphere was performed. RADIOPHARMACEUTICALS:  71.2 millicuries yttrium 90 microspheres. COMPARISON:  PET-CT 06/17/2019, MRI 06/03/2019 FINDINGS: Y - 90 microspheres therapy as above. First therapy the right hepatic lobe. Bremsstrahlung planar and SPECT imaging of the abdomen following intrahepatic arterial delivery of Y-8mcrosphere demonstrates radioactivity localized to the RIGHT hepatic lobe. No evidence of extrahepatic activity. IMPRESSION: Successful Y - 90 microsphere delivery for treatment of unresectable liver metastasis. First therapy to the RIGHT lobe. Bremssstrahlung scan demonstrates activity localized to RIGHT hepatic  lobe with no extrahepatic activity identified. Electronically Signed   By: SSuzy BouchardM.D.   On: 07/27/2019 12:57    Assessment and plan- Patient is a 73y.o. male diagnosed with recurrent urothelial carcinoma of the bladder who presents to symptom management clinic for abdominal distention, leg swelling, and shoulder pain.  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  Biopsy confirmed metastatic disease in left supraclavicular lymph node.  PET from September showed widespread progressive metastatic disease.  Additionally, hypermetabolic pulmonary nodules.  Known bony lesions.  Currently receiving palliative Keytruda.  He received Y 90 ablation to liver with symptomatic improvement.  Findings today concerning for progressive disease.  We will get CT scans to evaluate further.  Abdominal Distension-abdominal x-ray was negative for acute pathology.  No constipation.  Abnormal LFTs on blood work today.  Findings concerning for progressive malignancy.  Discussed with Dr. FGrayland Ormondwho recommends CT scans for further evaluation.  Right shoulder pain-PET scan from September showed hypermetabolic lesion in inferior right scapula that corresponds with area of pain today.  Discussed options for management including referral to pain management versus radiation oncology versus follow-up with palliative care for pain control.  He would like to proceed with referral to radiation oncology.   Dependent edema-multifactorial-less physically active, low albumin and total protein.  Encouraged elevation of extremities when sitting.  Provided with compression stockings today for comfort.  Pain secondary to metastatic disease-currently moderately controlled on fentanyl and hydrocodone  Goals of care-again addressed goals of care.  Patient and family had previously discussed hospice in would consider if progressive malignancy.  No decisions today however.  Follow-up with palliative  care.  Called patient to discuss results of imaging and blood work.  His daughter answered the phone and said that he was unavailable/taking a nap.  She is his healthcare power of attorney & he ok'd me speaking with her. Results discussed in detail.  We discussed changing pain medication from hydrocodone given liver function tests.  We discussed a follow-up virtual visit to discuss with patient.   Disposition: Virtual visit with patient on 08/17/2019 to discuss adjustment of pain medication. Referral to radiation oncology for consideration of palliative radiation to right shoulder CT scans & follow-up with Dr. Grayland Ormond for results   Visit Diagnosis 1. Urothelial carcinoma of bladder ([place])   2. Neoplasm of bladder   3. Abdominal distension   4. Pain from bone metastases ([facility])   5. Chronic pain in right shoulder     Patient expressed understanding and was in agreement with this plan. He also understands that He can call clinic at any time with any questions, concerns, or complaints.   Thank you for allowing me to participate in the care of this very pleasant patient.   Beckey Rutter, DNP, AGNP-[facility]  CC: Dr. Grayland Ormond, Dr. Baruch Gouty, Billey Chang, NP

## 2019-08-16 NOTE — Telephone Encounter (Signed)
Daughter called and reports that patient had edema of the feet and ankles and that his abdominal is distended since over the weekend. She wants him to be seen. Pease advise

## 2019-08-17 ENCOUNTER — Inpatient Hospital Stay (HOSPITAL_BASED_OUTPATIENT_CLINIC_OR_DEPARTMENT_OTHER): Payer: Medicare Other | Admitting: Nurse Practitioner

## 2019-08-17 DIAGNOSIS — G893 Neoplasm related pain (acute) (chronic): Secondary | ICD-10-CM | POA: Diagnosis not present

## 2019-08-17 DIAGNOSIS — K5903 Drug induced constipation: Secondary | ICD-10-CM

## 2019-08-17 DIAGNOSIS — T402X5A Adverse effect of other opioids, initial encounter: Secondary | ICD-10-CM | POA: Insufficient documentation

## 2019-08-17 DIAGNOSIS — C7951 Secondary malignant neoplasm of bone: Secondary | ICD-10-CM | POA: Diagnosis not present

## 2019-08-17 MED ORDER — OXYCODONE HCL 5 MG PO TABS: 2.5000 mg | ORAL_TABLET | Freq: Four times a day (QID) | ORAL | 0 refills | Status: AC | PRN

## 2019-08-17 NOTE — Progress Notes (Signed)
Virtual Visit Progress Note  Symptom Management Clinic [facility]  [phone] [phone]  I connected with [name] on 08/17/19 at  8:00 AM EST by video enabled telemedicine visit and verified that I am speaking with the correct person using two identifiers.   I discussed the limitations, risks, security and privacy concerns of performing an evaluation and management service by telemedicine and the availability of in-person appointments. I also discussed with the patient that there may be a patient responsible charge related to this service. The patient expressed understanding and agreed to proceed.   Other persons participating in the visit and their role in the encounter: Patient's daughter Caryl Pina  Patients location: Home Providers location: Home  Chief Complaint: Pain    Patient Care Team: Baxter Hire, MD as PCP - General (Internal Medicine) [name], MD as PCP - Cardiology (Cardiology) [name], MD as Consulting Physician (Oncology)   Name of the patient: David Rich  [id number]  11-29-1945   Date of visit: 08/17/19  Diagnosis-metastatic urothelial carcinoma of the bladder  Chief complaint/ Reason for visit-pain  Heme/Onc history:  Oncology History  Urothelial carcinoma of bladder ([place])  03/23/2018 Initial Diagnosis   Urothelial carcinoma of bladder ([place])   03/26/2018 Cancer Staging   Staging form: Urinary Bladder, AJCC 8th Edition - Clinical: Stage IVA [place] Nancy, cN2, cM1a) - Signed by [name], MD on 04/09/2018   04/08/2018 - 05/19/2019 Chemotherapy   The patient had palonosetron (ALOXI) injection 0.25 mg, 0.25 mg, Intravenous,  Once, 5 of 7 cycles Administration: 0.25 mg (04/08/2018), 0.25 mg (04/15/2018), 0.25 mg (04/29/2018), 0.25 mg (05/06/2018), 0.25 mg (05/20/2018), 0.25 mg (05/27/2018), 0.25 mg (03/25/2019), 0.25 mg (04/08/2019), 0.25 mg (04/29/2019), 0.25 mg (05/06/2019) CISplatin (PLATINOL)  74 mg in sodium chloride 0.9 % 250 mL chemo infusion, 35 mg/m2 = 74 mg (100 % of original dose 35 mg/m2), Intravenous,  Once, 5 of 7 cycles Dose modification: 35 mg/m2 (original dose 35 mg/m2, Cycle 1, Reason: Other (see comments)) Administration: 74 mg (04/08/2018), 74 mg (04/15/2018), 74 mg (04/29/2018), 74 mg (05/06/2018), 74 mg (05/20/2018), 74 mg (05/27/2018), 74 mg (03/25/2019), 74 mg (04/08/2019), 74 mg (04/29/2019), 74 mg (05/06/2019) gemcitabine (GEMZAR) 2,200 mg in sodium chloride 0.9 % 250 mL chemo infusion, 2,128 mg, Intravenous,  Once, 5 of 7 cycles Dose modification: 800 mg/m2 (original dose 1,000 mg/m2, Cycle 3, Reason: Dose not tolerated) Administration: 2,200 mg (04/08/2018), 2,200 mg (04/15/2018), 2,200 mg (04/29/2018), 2,000 mg (05/06/2018), 1,600 mg (05/20/2018), 1,600 mg (05/27/2018), 1,600 mg (03/25/2019), 1,600 mg (04/08/2019), 1,600 mg (04/29/2019), 1,600 mg (05/06/2019) fosaprepitant (EMEND) 150 mg, dexamethasone (DECADRON) 12 mg in sodium chloride 0.9 % 145 mL IVPB, , Intravenous,  Once, 5 of 7 cycles Administration:  (04/08/2018),  (04/15/2018),  (04/29/2018),  (05/06/2018),  (05/20/2018),  (05/27/2018),  (03/25/2019),  (04/08/2019),  (04/29/2019),  (05/06/2019)  for chemotherapy treatment.    06/03/2019 -  Chemotherapy   The patient had pembrolizumab (KEYTRUDA) 200 mg in sodium chloride 0.9 % 50 mL chemo infusion, 200 mg, Intravenous, Once, 4 of 6 cycles Administration: 200 mg (06/03/2019), 200 mg (06/22/2019), 200 mg (07/12/2019), 200 mg (08/02/2019)  for chemotherapy treatment.      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  Pain has been ongoing and has known metastatic disease in the right scapula.  He  has been referred to radiation oncology for consideration of palliative radiation.  Blood work yesterday showed abnormal  liver function test.  He is awaiting CT scans for restaging.  He rates pain 3 of 10.  When at rest.  Sometimes the pain is so severe that it wakes him up at night.  He currently takes hydrocodone 1 tablet every 6 hours for breakthrough pain and continues fentanyl 25 mcg patch.  Shoulder pain occurs all the time and is made worse with movement.  He is right-handed. Reports bowel movements every 2 to 3 days.  Currently takes MiraLAX and Senokot twice a day.  Appetite continues to be fair.  He spends most of his day in the chair or bed.  Denies nausea, vomiting.  Denies urinary complaints.  ECOG FS:3 - Symptomatic, >50% confined to bed  Review of systems- Review of Systems  Constitutional: Positive for malaise/fatigue and weight loss. Negative for chills and fever.  Respiratory: Negative for cough and shortness of breath.   Cardiovascular: Positive for leg swelling. Negative for chest pain and palpitations.  Gastrointestinal: Positive for abdominal pain (Fullness due to distention). Negative for constipation and diarrhea.  Genitourinary: Negative for dysuria and flank pain.  Musculoskeletal: Positive for back pain and joint pain.  Skin: Negative for itching and rash.  Neurological: Negative for dizziness, weakness and headaches.  Psychiatric/Behavioral: Negative for depression. The patient is not nervous/anxious and does not have insomnia (Interrupted sleep due to pain).     Current treatment-Keytruda  Allergies  Allergen Reactions   Clopidogrel Rash and Hives   Rosuvastatin Calcium Anaphylaxis   Etodolac Nausea And Vomiting and Other (See Comments)    Bad dreams Bad dreams    Hydrocodone Other (See Comments)    Bad dreams Bad dreams    Other    Statins    Meloxicam Rash    Bad dreams Bad dreams    Pravastatin Sodium Rash    Past Medical History:  Diagnosis Date   Cancer ([place]) 2013   Right Renal    Hyperlipidemia    Hypertension    Renal disorder    Urothelial  carcinoma of bladder ([place]) 2020    Past Surgical History:  Procedure Laterality Date   CORONARY STENT INTERVENTION N/A 09/28/2018   Procedure: CORONARY STENT INTERVENTION;  Surgeon: [name], MD;  Location: [facility];  Service: Cardiovascular;  Laterality: N/A;   CORONARY STENT PLACEMENT     IR ANGIOGRAM SELECTIVE EACH ADDITIONAL VESSEL  07/13/2019   IR ANGIOGRAM SELECTIVE EACH ADDITIONAL VESSEL  07/13/2019   IR ANGIOGRAM SELECTIVE EACH ADDITIONAL VESSEL  07/13/2019   IR ANGIOGRAM SELECTIVE EACH ADDITIONAL VESSEL  07/13/2019   IR ANGIOGRAM SELECTIVE EACH ADDITIONAL VESSEL  07/13/2019   IR ANGIOGRAM SELECTIVE EACH ADDITIONAL VESSEL  07/13/2019   IR ANGIOGRAM SELECTIVE EACH ADDITIONAL VESSEL  07/27/2019   IR ANGIOGRAM SELECTIVE EACH ADDITIONAL VESSEL  07/27/2019   IR ANGIOGRAM VISCERAL SELECTIVE  07/13/2019   IR ANGIOGRAM VISCERAL SELECTIVE  07/27/2019   IR EMBO ARTERIAL NOT HEMORR HEMANG INC GUIDE ROADMAPPING  07/13/2019   IR EMBO TUMOR ORGAN ISCHEMIA INFARCT INC GUIDE ROADMAPPING  07/27/2019   IR FLUORO GUIDE CV LINE RIGHT  04/03/2018   IR RADIOLOGIST EVAL & MGMT  06/01/2019   IR RADIOLOGIST EVAL & MGMT  06/09/2019   IR US GUIDE VASC ACCESS RIGHT  07/13/2019   IR US GUIDE VASC ACCESS RIGHT  07/27/2019   kidney removed Left 2013   LEFT HEART CATH  AND CORONARY ANGIOGRAPHY N/A 09/28/2018   Procedure: LEFT HEART CATH AND CORONARY ANGIOGRAPHY;  Surgeon: [name], MD;  Location: [facility];  Service: Cardiovascular;  Laterality: N/A;    Social History   Socioeconomic History   Marital status: Divorced    Spouse name: Not on file   Number of children: Not on file   Years of education: Not on file   Highest education level: Not on file  Occupational History   Not on file  Social Needs   Financial resource strain: Not on file   Food insecurity    Worry: Not on file    Inability: Not on file   Transportation needs    Medical:  Not on file    Non-medical: Not on file  Tobacco Use   Smoking status: Former Smoker    Packs/day: 1.00    Years: 55.00    Pack years: 55.00    Types: Cigarettes    Quit date: 03/20/2012    Years since quitting: 7.4   Smokeless tobacco: Never Used  Substance and Sexual Activity   Alcohol use: Yes    Comment: Social   Drug use: No   Sexual activity: Not on file  Lifestyle   Physical activity    Days per week: Not on file    Minutes per session: Not on file   Stress: Not on file  Relationships   Social connections    Talks on phone: Not on file    Gets together: Not on file    Attends religious service: Not on file    Active member of club or organization: Not on file    Attends meetings of clubs or organizations: Not on file    Relationship status: Not on file   Intimate partner violence    Fear of current or ex partner: Not on file    Emotionally abused: Not on file    Physically abused: Not on file    Forced sexual activity: Not on file  Other Topics Concern   Not on file  Social History Narrative   Not on file    Family History  Problem Relation Age of Onset   Coronary artery disease Mother    Anemia Mother    Kidney failure Mother    Subarachnoid hemorrhage Father    Breast cancer Sister 27   Hypertension Brother      Current Outpatient Medications:    ALPRAZolam (XANAX) 0.25 MG tablet, , Disp: , Rfl:    amLODipine (NORVASC) 10 MG tablet, TAKE 1 TABLET BY MOUTH EVERY DAY, Disp: , Rfl:    aspirin EC 81 MG tablet, Take by mouth., Disp: , Rfl:    carvedilol (COREG) 6.25 MG tablet, Take 1 tablet (6.25 mg total) by mouth 2 (two) times daily. (Patient not taking: Reported on 08/16/2019), Disp: 60 tablet, Rfl: 6   clopidogrel (PLAVIX) 75 MG tablet, Take 75 mg by mouth daily., Disp: , Rfl:    docusate sodium (COLACE) 100 MG capsule, Take 100 mg by mouth 2 (two) times daily as needed for mild constipation. 2 in am, 3 in pm, Disp: , Rfl:     ezetimibe (ZETIA) 10 MG tablet, TAKE 1 TABLET BY MOUTH DAILY (Patient not taking: Reported on 08/16/2019), Disp: 90 tablet, Rfl: 0   famotidine (PEPCID) 20 MG tablet, Take 20 mg by mouth 2 (two) times daily. , Disp: , Rfl:    fentaNYL (DURAGESIC) 25 MCG/HR, Place 1 patch onto the skin every  3 (three) days., Disp: , Rfl:    Garlic 725 MG TABS, Take by mouth. , Disp: , Rfl:    HYDROcodone-acetaminophen (NORCO/VICODIN) 5-325 MG tablet, Take 1 tablet by mouth every 6 (six) hours as needed for moderate pain., Disp: 120 tablet, Rfl: 0   magnesium hydroxide (MILK OF MAGNESIA) 400 MG/5ML suspension, Take 30 mLs by mouth., Disp: , Rfl:    megestrol (MEGACE) 20 MG tablet, Take 1 tablet (20 mg total) by mouth daily., Disp: 30 tablet, Rfl: 1   metoCLOPramide (REGLAN) 10 MG tablet, Take 1 tablet (10 mg total) by mouth 4 (four) times daily. (Patient not taking: Reported on 08/16/2019), Disp: 30 tablet, Rfl: 1   Multiple Vitamin (MULTI-VITAMINS) TABS, Take by mouth., Disp: , Rfl:    nitroGLYCERIN (NITROSTAT) 0.4 MG SL tablet, Place under the tongue., Disp: , Rfl:    Omega-3 Fatty Acids (FISH OIL OMEGA-3) 1000 MG CAPS, Take by mouth daily., Disp: , Rfl:    ondansetron (ZOFRAN) 8 MG tablet, Take 1 tablet (8 mg total) by mouth every 8 (eight) hours as needed for nausea or vomiting., Disp: 30 tablet, Rfl: 3   polyethylene glycol (MIRALAX / GLYCOLAX) 17 g packet, Take 17 g by mouth 2 (two) times daily. , Disp: , Rfl:    prochlorperazine (COMPAZINE) 10 MG tablet, Take 1 tablet (10 mg total) by mouth every 6 (six) hours as needed for nausea or vomiting., Disp: 30 tablet, Rfl: 0   sucralfate (CARAFATE) 1 g tablet, Take 1 tablet (1 g total) by mouth 3 (three) times daily., Disp: 90 tablet, Rfl: 3   traZODone (DESYREL) 50 MG tablet, Take 1-2 tablets (50-100 mg total) by mouth at bedtime., Disp: 30 tablet, Rfl: 0 No current facility-administered medications for this visit.   Facility-Administered Medications  Ordered in Other Visits:    sodium chloride flush (NS) 0.9 % injection 10 mL, 10 mL, Intravenous, PRN, Grayland Ormond, [name], MD, 10 mL at 10/05/18 1026  Physical exam: Exam limited due to telemedicine  Physical Exam Constitutional:      General: He is not in acute distress.    Comments: Laying in bed.  Accompanied by his daughter.  Eyes:     General: No scleral icterus. Neurological:     Mental Status: He is alert and oriented to person, place, and time.  Psychiatric:        Mood and Affect: Mood normal.        Behavior: Behavior normal.      CMP Latest Ref Rng & Units 08/16/2019  Glucose 70 - 99 mg/dL 77  BUN 8 - 23 mg/dL 19  Creatinine 0.61 - 1.24 mg/dL 0.90  Sodium 135 - 145 mmol/L 131(L)  Potassium 3.5 - 5.1 mmol/L 4.7  Chloride 98 - 111 mmol/L 104  CO2 22 - 32 mmol/L 17(L)  Calcium 8.9 - 10.3 mg/dL 9.5  Total Protein 6.5 - 8.1 g/dL 6.4(L)  Total Bilirubin 0.3 - 1.2 mg/dL 1.0  Alkaline Phos 38 - 126 U/L 434(H)  AST 15 - 41 U/L 84(H)  ALT 0 - 44 U/L 56(H)   CBC Latest Ref Rng & Units 08/16/2019  WBC 4.0 - 10.5 K/uL 11.0(H)  Hemoglobin 13.0 - 17.0 g/dL 10.8(L)  Hematocrit 39.0 - 52.0 % 34.4(L)  Platelets 150 - 400 K/uL 128(L)    No images are attached to the encounter.  Nm Liver Tumor Loc Imflam Spect 1 Day  Result Date: 07/27/2019 CLINICAL DATA:  Unresectable liver metastasis. Bladder carcinoma primary. First treatment  to the RIGHT hepatic lobe. EXAM: NUCLEAR MEDICINE SPECIAL MED RAD PHYSICS CONS; NUCLEAR MEDICINE RADIO PHARM THERAPY INTRA ARTERIAL; NUCLEAR MEDICINE TREATMENT PROCEDURE; NUCLEAR MEDICINE LIVER SCAN TECHNIQUE: In conjunction with the interventional radiologist a Y- Microsphere dose was calculated utilizing body surface area formulation. Calculated dose equal 28.2 mCi. Pre therapy MAA liver SPECT scan and CTA were evaluated. Utilizing a microcatheter system, the RIGHT hepatic artery was selected and Y-90 microspheres were delivered in fractionated  aliquots. Radiopharmaceutical was delivered by the interventional radiologist and nuclear radiologist. The patient tolerated procedure well. No adverse effects were noted. Bremsstrahlung planar and SPECT imaging of the abdomen following intrahepatic arterial delivery of Y-90 microsphere was performed. RADIOPHARMACEUTICALS:  38.9 millicuries yttrium 90 microspheres. COMPARISON:  PET-CT 06/17/2019, MRI 06/03/2019 FINDINGS: Y - 90 microspheres therapy as above. First therapy the right hepatic lobe. Bremsstrahlung planar and SPECT imaging of the abdomen following intrahepatic arterial delivery of Y-48mcrosphere demonstrates radioactivity localized to the RIGHT hepatic lobe. No evidence of extrahepatic activity. IMPRESSION: Successful Y - 90 microsphere delivery for treatment of unresectable liver metastasis. First therapy to the RIGHT lobe. Bremssstrahlung scan demonstrates activity localized to RIGHT hepatic lobe with no extrahepatic activity identified. Electronically Signed   By: SSuzy BouchardM.D.   On: 07/27/2019 12:57   Ir Angiogram Visceral Selective  Result Date: 07/27/2019 INDICATION: History of metastatic bladder cancer. Patient presents today for Y 90 radioembolization of the right lobe of the liver Note, pre Y 90 mapping radioembolization demonstrated a right gastric artery however this cannot be successfully coil embolized. As such will also attempt repeat coil embolization of the right gastric artery. EXAM: 1. ULTRASOUND GUIDANCE FOR ARTERIAL ACCESS 2. CELIAC ARTERIOGRAM 3. RIGHT GASTRIC ARTERIOGRAM 4. RIGHT HEPATIC AND RADIOEMBOLIZATION OF THE RIGHT LOBE OF THE LIVER 5. FLUOROSCOPIC GUIDED ADMINISTRATION OF SIR-SPHERES COMPARISON:  Mapping Y 90 radioembolization-07/13/2019; PET-CT-06/17/2019; CT abdomen pelvis-05/24/2019; abdominal MRI - 06/03/2019 MEDICATIONS: Protonix 40 mg IV; Decadron 20 mg IV; Toradol 30 mg IV; Zofran 4 mg IV CONTRAST:  80 cc OMNIPAQUE IOHEXOL 300 MG/ML  SOLN  ANESTHESIA/SEDATION: Moderate (conscious) sedation was employed during this procedure. A total of Versed 3 mg and Fentanyl 200 mcg was administered intravenously. Moderate Sedation Time: 70 minutes. The patient's level of consciousness and vital signs were monitored continuously by radiology nursing throughout the procedure under my direct supervision. FLUOROSCOPY TIME:  15 minutes, 30 seconds (1,208 mGy) ACCESS: Right common femoral artery; hemostasis achieved with manual compression. COMPLICATIONS: None immediate. TECHNIQUE: Informed written consent was obtained from the patient after a discussion of the risks, benefits and alternatives to treatment. Questions regarding the procedure were encouraged and answered. A timeout was performed prior to the initiation of the procedure. The right groin was prepped and draped in the usual sterile fashion, and a sterile drape was applied covering the operative field. Maximum barrier sterile technique with sterile gowns and gloves were used for the procedure. A timeout was performed prior to the initiation of the procedure. Local anesthesia was provided with 1% lidocaine. The right femoral head was marked fluoroscopically. Under direct ultrasound guidance, the right common femoral artery was accessed with a micropuncture kit allowing placement of a of 5-French vascular sheath. An ultrasound image was saved for documentation purposes. A limited arteriogram performed through the side arm of the sheath confirming appropriate access within the right common femoral artery. Over a BBritta Mccreedywire, a Mickelson catheter was advanced to the level of the inferior thoracic aorta where it was reformed, back bled and flushed. The  Mickelson catheter was utilized to select the celiac artery and a celiac arteriogram was performed. Next, with the use of a fathom 14 microwire, a regular Renegade microcatheter was utilized to select the right gastric artery. Despite successful cannulation of the  origin the right gastric artery and advancement of a microwire to its mid aspect, the microcatheter would not track beyond the origin the vessel secondary to tortuosity of the vessel's origin as well as it's small caliber. Next, the microcatheter was advanced to the level of the right hepatic artery and a selective right hepatic arteriogram was performed. Appropriate position was confirmed and the Radioembolization was then performed with Yttrium-90 SIR Spheres. Particles were administered via a microcatheter utilizing a completely enclosed system. Monitoring of antegrade flow was performed during and after the administration under fluoroscopy with use of contrast intermittently. After the administration of the particles, the microcatheter, outer catheter and administration system were then discarded into radiation safety receptacle. At this point, the procedure was terminated. The right common femoral approach vascular sheath was removed and hemostasis was achieved with manual compression. A dressing was placed. The patient tolerated procedure well without immediate postprocedural complication. All participants involved with the procedure were surveyed by the radiation safety officer prior to exiting the fluoroscopy suite. The patient was escorted to the nuclear medicine department for post-treatment imaging. FINDINGS: Selective celiac arteriogram demonstrates complete embolization of the gastroduodenal artery without residual flow. Selective right gastric arteriogram demonstrates patency and tortuosity/narrowing of the origin and proximal aspect. The origin of the right gastric artery was again noted to be adjacent to the dominant left hepatic artery. Despite catheterization of the origin of the vessel and advancement of a microwire to its mid aspect, the microcatheter could not be advanced beyond the origin to allow for safe percutaneous coil embolization. Selective right hepatic arteriogram demonstrates an  accessory anterior division of the right hepatic artery arising proximal to the mid division of the vessel. The microcatheter was placed proximal to the origin of the accessory right hepatic artery and Y 90 radioembolization was performed from this location. Intermittent contrast injections during and after the radioembolization confirming preserved antegrade flow without reflux. IMPRESSION: 1. Technically successful radioembolization of the right lobe of the liver with Yttrium-90 microspheres. 2. Attempted though unsuccessful coil embolization of the right gastric artery secondary to tortuosity of the vessel's origin and small caliber of the vessel. PLAN: - Given inability to successfully percutaneously coil embolized the right gastric artery, Y 90 radioembolization of left lobe of the liver will NOT be pursued given proximity of the right gastric artery to the origin of the left hepatic artery and concern for nontarget embolization in this patient with progressive/advanced intra and extrahepatic metastatic disease. - The patient will be seen in follow-up consultation in the IR clinic in 3-4 weeks following acquisition of postprocedural CMP. - initial surveillance imaging will be performed in 3 months (likely with PET-CT). Electronically Signed   By: Sandi Mariscal M.D.   On: 07/27/2019 13:14   Ir Angiogram Selective Each Additional Vessel  Result Date: 07/27/2019 INDICATION: History of metastatic bladder cancer. Patient presents today for Y 90 radioembolization of the right lobe of the liver Note, pre Y 90 mapping radioembolization demonstrated a right gastric artery however this cannot be successfully coil embolized. As such will also attempt repeat coil embolization of the right gastric artery. EXAM: 1. ULTRASOUND GUIDANCE FOR ARTERIAL ACCESS 2. CELIAC ARTERIOGRAM 3. RIGHT GASTRIC ARTERIOGRAM 4. RIGHT HEPATIC AND RADIOEMBOLIZATION OF THE RIGHT LOBE  OF THE LIVER 5. FLUOROSCOPIC GUIDED ADMINISTRATION OF  SIR-SPHERES COMPARISON:  Mapping Y 90 radioembolization-07/13/2019; PET-CT-06/17/2019; CT abdomen pelvis-05/24/2019; abdominal MRI - 06/03/2019 MEDICATIONS: Protonix 40 mg IV; Decadron 20 mg IV; Toradol 30 mg IV; Zofran 4 mg IV CONTRAST:  80 cc OMNIPAQUE IOHEXOL 300 MG/ML  SOLN ANESTHESIA/SEDATION: Moderate (conscious) sedation was employed during this procedure. A total of Versed 3 mg and Fentanyl 200 mcg was administered intravenously. Moderate Sedation Time: 70 minutes. The patient's level of consciousness and vital signs were monitored continuously by radiology nursing throughout the procedure under my direct supervision. FLUOROSCOPY TIME:  15 minutes, 30 seconds (1,208 mGy) ACCESS: Right common femoral artery; hemostasis achieved with manual compression. COMPLICATIONS: None immediate. TECHNIQUE: Informed written consent was obtained from the patient after a discussion of the risks, benefits and alternatives to treatment. Questions regarding the procedure were encouraged and answered. A timeout was performed prior to the initiation of the procedure. The right groin was prepped and draped in the usual sterile fashion, and a sterile drape was applied covering the operative field. Maximum barrier sterile technique with sterile gowns and gloves were used for the procedure. A timeout was performed prior to the initiation of the procedure. Local anesthesia was provided with 1% lidocaine. The right femoral head was marked fluoroscopically. Under direct ultrasound guidance, the right common femoral artery was accessed with a micropuncture kit allowing placement of a of 5-French vascular sheath. An ultrasound image was saved for documentation purposes. A limited arteriogram performed through the side arm of the sheath confirming appropriate access within the right common femoral artery. Over a Britta Mccreedy wire, a Mickelson catheter was advanced to the level of the inferior thoracic aorta where it was reformed, back bled and  flushed. The Mickelson catheter was utilized to select the celiac artery and a celiac arteriogram was performed. Next, with the use of a fathom 14 microwire, a regular Renegade microcatheter was utilized to select the right gastric artery. Despite successful cannulation of the origin the right gastric artery and advancement of a microwire to its mid aspect, the microcatheter would not track beyond the origin the vessel secondary to tortuosity of the vessel's origin as well as it's small caliber. Next, the microcatheter was advanced to the level of the right hepatic artery and a selective right hepatic arteriogram was performed. Appropriate position was confirmed and the Radioembolization was then performed with Yttrium-90 SIR Spheres. Particles were administered via a microcatheter utilizing a completely enclosed system. Monitoring of antegrade flow was performed during and after the administration under fluoroscopy with use of contrast intermittently. After the administration of the particles, the microcatheter, outer catheter and administration system were then discarded into radiation safety receptacle. At this point, the procedure was terminated. The right common femoral approach vascular sheath was removed and hemostasis was achieved with manual compression. A dressing was placed. The patient tolerated procedure well without immediate postprocedural complication. All participants involved with the procedure were surveyed by the radiation safety officer prior to exiting the fluoroscopy suite. The patient was escorted to the nuclear medicine department for post-treatment imaging. FINDINGS: Selective celiac arteriogram demonstrates complete embolization of the gastroduodenal artery without residual flow. Selective right gastric arteriogram demonstrates patency and tortuosity/narrowing of the origin and proximal aspect. The origin of the right gastric artery was again noted to be adjacent to the dominant left  hepatic artery. Despite catheterization of the origin of the vessel and advancement of a microwire to its mid aspect, the microcatheter could not be advanced beyond  the origin to allow for safe percutaneous coil embolization. 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 The microcatheter was placed proximal to the origin of the accessory right hepatic artery and Y 90 radioembolization was performed from this location. Intermittent contrast injections during and after the radioembolization confirming preserved antegrade flow without reflux. IMPRESSION: 1. Technically successful radioembolization of the right lobe of the liver with Yttrium-90 microspheres. 2. Attempted though unsuccessful coil embolization of the right gastric artery secondary to tortuosity of the vessel's origin and small caliber of the vessel. PLAN: - Given inability to successfully percutaneously coil embolized the right gastric artery, Y 90 radioembolization of left lobe of the liver will NOT be pursued given proximity of the right gastric artery to the origin of the left hepatic artery and concern for nontarget embolization in this patient with progressive/advanced intra and extrahepatic metastatic disease. - The patient will be seen in follow-up consultation in the IR clinic in 3-4 weeks following acquisition of postprocedural CMP. - initial surveillance imaging will be performed in 3 months (likely with PET-CT). Electronically Signed   By: Sandi Mariscal M.D.   On: 07/27/2019 13:14   Ir Angiogram Selective Each Additional Vessel  Result Date: 07/27/2019 INDICATION: History of metastatic bladder cancer. Patient presents today for Y 90 radioembolization of the right lobe of the liver Note, pre Y 90 mapping radioembolization demonstrated a right gastric artery however this cannot be successfully coil embolized. As such will also  attempt repeat coil embolization of the right gastric artery. EXAM: 1. ULTRASOUND GUIDANCE FOR ARTERIAL ACCESS 2. CELIAC ARTERIOGRAM 3. RIGHT GASTRIC ARTERIOGRAM 4. RIGHT HEPATIC AND RADIOEMBOLIZATION OF THE RIGHT LOBE OF THE LIVER 5. FLUOROSCOPIC GUIDED ADMINISTRATION OF SIR-SPHERES COMPARISON:  Mapping Y 90 radioembolization-07/13/2019; PET-CT-06/17/2019; CT abdomen pelvis-05/24/2019; abdominal MRI - 06/03/2019 MEDICATIONS: Protonix 40 mg IV; Decadron 20 mg IV; Toradol 30 mg IV; Zofran 4 mg IV CONTRAST:  80 cc OMNIPAQUE IOHEXOL 300 MG/ML  SOLN ANESTHESIA/SEDATION: Moderate (conscious) sedation was employed during this procedure. A total of Versed 3 mg and Fentanyl 200 mcg was administered intravenously. Moderate Sedation Time: 70 minutes. The patient's level of consciousness and vital signs were monitored continuously by radiology nursing throughout the procedure under my direct supervision. FLUOROSCOPY TIME:  15 minutes, 30 seconds (1,208 mGy) ACCESS: Right common femoral artery; hemostasis achieved with manual compression. COMPLICATIONS: None immediate. TECHNIQUE: Informed written consent was obtained from the patient after a discussion of the risks, benefits and alternatives to treatment. Questions regarding the procedure were encouraged and answered. A timeout was performed prior to the initiation of the procedure. The right groin was prepped and draped in the usual sterile fashion, and a sterile drape was applied covering the operative field. Maximum barrier sterile technique with sterile gowns and gloves were used for the procedure. A timeout was performed prior to the initiation of the procedure. Local anesthesia was provided with 1% lidocaine. The right femoral head was marked fluoroscopically. Under direct ultrasound guidance, the right common femoral artery was accessed with a micropuncture kit allowing placement of a of 5-French vascular sheath. An ultrasound image was saved for documentation purposes.  A limited arteriogram performed through the side arm of the sheath confirming appropriate access within the right common femoral artery. Over a Britta Mccreedy wire, a Mickelson catheter was advanced to the level of the inferior thoracic aorta where it was reformed, back bled and flushed. The Mickelson catheter was  utilized to select the celiac artery and a celiac arteriogram was performed. Next, with the use of a fathom 14 microwire, a regular Renegade microcatheter was utilized to select the right gastric artery. Despite successful cannulation of the origin the right gastric artery and advancement of a microwire to its mid aspect, the microcatheter would not track beyond the origin the vessel secondary to tortuosity of the vessel's origin as well as it's small caliber. Next, the microcatheter was advanced to the level of the right hepatic artery and a selective right hepatic arteriogram was performed. Appropriate position was confirmed and the Radioembolization was then performed with Yttrium-90 SIR Spheres. Particles were administered via a microcatheter utilizing a completely enclosed system. Monitoring of antegrade flow was performed during and after the administration under fluoroscopy with use of contrast intermittently. After the administration of the particles, the microcatheter, outer catheter and administration system were then discarded into radiation safety receptacle. At this point, the procedure was terminated. The right common femoral approach vascular sheath was removed and hemostasis was achieved with manual compression. A dressing was placed. The patient tolerated procedure well without immediate postprocedural complication. All participants involved with the procedure were surveyed by the radiation safety officer prior to exiting the fluoroscopy suite. The patient was escorted to the nuclear medicine department for post-treatment imaging. FINDINGS: Selective celiac arteriogram demonstrates complete  embolization of the gastroduodenal artery without residual flow. Selective right gastric arteriogram demonstrates patency and tortuosity/narrowing of the origin and proximal aspect. The origin of the right gastric artery was again noted to be adjacent to the dominant left hepatic artery. Despite catheterization of the origin of the vessel and advancement of a microwire to its mid aspect, the microcatheter could not be advanced beyond the origin to allow for safe percutaneous coil embolization. 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 The microcatheter was placed proximal to the origin of the accessory right hepatic artery and Y 90 radioembolization was performed from this location. Intermittent contrast injections during and after the radioembolization confirming preserved antegrade flow without reflux. IMPRESSION: 1. Technically successful radioembolization of the right lobe of the liver with Yttrium-90 microspheres. 2. Attempted though unsuccessful coil embolization of the right gastric artery secondary to tortuosity of the vessel's origin and small caliber of the vessel. PLAN: - Given inability to successfully percutaneously coil embolized the right gastric artery, Y 90 radioembolization of left lobe of the liver will NOT be pursued given proximity of the right gastric artery to the origin of the left hepatic artery and concern for nontarget embolization in this patient with progressive/advanced intra and extrahepatic metastatic disease. - The patient will be seen in follow-up consultation in the IR clinic in 3-4 weeks following acquisition of postprocedural CMP. - initial surveillance imaging will be performed in 3 months (likely with PET-CT). Electronically Signed   By: Sandi Mariscal M.D.   On: 07/27/2019 13:14   Nm Special Med Rad Physics Cons  Result Date: 07/27/2019 CLINICAL DATA:   Unresectable liver metastasis. Bladder carcinoma primary. First treatment to the RIGHT hepatic lobe. EXAM: NUCLEAR MEDICINE SPECIAL MED RAD PHYSICS CONS; NUCLEAR MEDICINE RADIO PHARM THERAPY INTRA ARTERIAL; NUCLEAR MEDICINE TREATMENT PROCEDURE; NUCLEAR MEDICINE LIVER SCAN TECHNIQUE: In conjunction with the interventional radiologist a Y- Microsphere dose was calculated utilizing body surface area formulation. Calculated dose equal 28.2 mCi. Pre therapy MAA liver SPECT scan and CTA were evaluated. Utilizing a microcatheter system, the  RIGHT hepatic artery was selected and Y-90 microspheres were delivered in fractionated aliquots. Radiopharmaceutical was delivered by the interventional radiologist and nuclear radiologist. The patient tolerated procedure well. No adverse effects were noted. Bremsstrahlung planar and SPECT imaging of the abdomen following intrahepatic arterial delivery of Y-90 microsphere was performed. RADIOPHARMACEUTICALS:  45.8 millicuries yttrium 90 microspheres. COMPARISON:  PET-CT 06/17/2019, MRI 06/03/2019 FINDINGS: Y - 90 microspheres therapy as above. First therapy the right hepatic lobe. Bremsstrahlung planar and SPECT imaging of the abdomen following intrahepatic arterial delivery of Y-61mcrosphere demonstrates radioactivity localized to the RIGHT hepatic lobe. No evidence of extrahepatic activity. IMPRESSION: Successful Y - 90 microsphere delivery for treatment of unresectable liver metastasis. First therapy to the RIGHT lobe. Bremssstrahlung scan demonstrates activity localized to RIGHT hepatic lobe with no extrahepatic activity identified. Electronically Signed   By: SSuzy BouchardM.D.   On: 07/27/2019 12:57   Nm Special Treatment Procedure  Result Date: 07/27/2019 CLINICAL DATA:  Unresectable liver metastasis. Bladder carcinoma primary. First treatment to the RIGHT hepatic lobe. EXAM: NUCLEAR MEDICINE SPECIAL MED RAD PHYSICS CONS; NUCLEAR MEDICINE RADIO PHARM THERAPY INTRA  ARTERIAL; NUCLEAR MEDICINE TREATMENT PROCEDURE; NUCLEAR MEDICINE LIVER SCAN TECHNIQUE: In conjunction with the interventional radiologist a Y- Microsphere dose was calculated utilizing body surface area formulation. Calculated dose equal 28.2 mCi. Pre therapy MAA liver SPECT scan and CTA were evaluated. Utilizing a microcatheter system, the RIGHT hepatic artery was selected and Y-90 microspheres were delivered in fractionated aliquots. Radiopharmaceutical was delivered by the interventional radiologist and nuclear radiologist. The patient tolerated procedure well. No adverse effects were noted. Bremsstrahlung planar and SPECT imaging of the abdomen following intrahepatic arterial delivery of Y-90 microsphere was performed. RADIOPHARMACEUTICALS:  209.9millicuries yttrium 90 microspheres. COMPARISON:  PET-CT 06/17/2019, MRI 06/03/2019 FINDINGS: Y - 90 microspheres therapy as above. First therapy the right hepatic lobe. Bremsstrahlung planar and SPECT imaging of the abdomen following intrahepatic arterial delivery of [id number] demonstrates radioactivity localized to the RIGHT hepatic lobe. No evidence of extrahepatic activity. IMPRESSION: Successful Y - 90 microsphere delivery for treatment of unresectable liver metastasis. First therapy to the RIGHT lobe. Bremssstrahlung scan demonstrates activity localized to RIGHT hepatic lobe with no extrahepatic activity identified. Electronically Signed   By: StSuzy Bouchard.D.   On: 07/27/2019 12:57   Ir UsKoreauide Vasc Access Right  Result Date: 07/27/2019 INDICATION: History of metastatic bladder cancer. Patient presents today for Y 90 radioembolization of the right lobe of the liver Note, pre Y 90 mapping radioembolization demonstrated a right gastric artery however this cannot be successfully coil embolized. As such will also attempt repeat coil embolization of the right gastric artery. EXAM: 1. ULTRASOUND GUIDANCE FOR ARTERIAL ACCESS 2. CELIAC ARTERIOGRAM 3.  RIGHT GASTRIC ARTERIOGRAM 4. RIGHT HEPATIC AND RADIOEMBOLIZATION OF THE RIGHT LOBE OF THE LIVER 5. FLUOROSCOPIC GUIDED ADMINISTRATION OF SIR-SPHERES COMPARISON:  Mapping Y 90 radioembolization-07/13/2019; PET-CT-06/17/2019; CT abdomen pelvis-05/24/2019; abdominal MRI - 06/03/2019 MEDICATIONS: Protonix 40 mg IV; Decadron 20 mg IV; Toradol 30 mg IV; Zofran 4 mg IV CONTRAST:  80 cc OMNIPAQUE IOHEXOL 300 MG/ML  SOLN ANESTHESIA/SEDATION: Moderate (conscious) sedation was employed during this procedure. A total of Versed 3 mg and Fentanyl 200 mcg was administered intravenously. Moderate Sedation Time: 70 minutes. The patient's level of consciousness and vital signs were monitored continuously by radiology nursing throughout the procedure under my direct supervision. FLUOROSCOPY TIME:  15 minutes, 30 seconds (1,208 mGy) ACCESS: Right common femoral artery; hemostasis achieved with manual compression. COMPLICATIONS: None immediate. TECHNIQUE: Informed written  consent was obtained from the patient after a discussion of the risks, benefits and alternatives to treatment. Questions regarding the procedure were encouraged and answered. A timeout was performed prior to the initiation of the procedure. The right groin was prepped and draped in the usual sterile fashion, and a sterile drape was applied covering the operative field. Maximum barrier sterile technique with sterile gowns and gloves were used for the procedure. A timeout was performed prior to the initiation of the procedure. Local anesthesia was provided with 1% lidocaine. The right femoral head was marked fluoroscopically. Under direct ultrasound guidance, the right common femoral artery was accessed with a micropuncture kit allowing placement of a of 5-French vascular sheath. An ultrasound image was saved for documentation purposes. A limited arteriogram performed through the side arm of the sheath confirming appropriate access within the right common femoral artery.  Over a Britta Mccreedy wire, a Mickelson catheter was advanced to the level of the inferior thoracic aorta where it was reformed, back bled and flushed. The Mickelson catheter was utilized to select the celiac artery and a celiac arteriogram was performed. Next, with the use of a fathom 14 microwire, a regular Renegade microcatheter was utilized to select the right gastric artery. Despite successful cannulation of the origin the right gastric artery and advancement of a microwire to its mid aspect, the microcatheter would not track beyond the origin the vessel secondary to tortuosity of the vessel's origin as well as it's small caliber. Next, the microcatheter was advanced to the level of the right hepatic artery and a selective right hepatic arteriogram was performed. Appropriate position was confirmed and the Radioembolization was then performed with Yttrium-90 SIR Spheres. Particles were administered via a microcatheter utilizing a completely enclosed system. Monitoring of antegrade flow was performed during and after the administration under fluoroscopy with use of contrast intermittently. After the administration of the particles, the microcatheter, outer catheter and administration system were then discarded into radiation safety receptacle. At this point, the procedure was terminated. The right common femoral approach vascular sheath was removed and hemostasis was achieved with manual compression. A dressing was placed. The patient tolerated procedure well without immediate postprocedural complication. All participants involved with the procedure were surveyed by the radiation safety officer prior to exiting the fluoroscopy suite. The patient was escorted to the nuclear medicine department for post-treatment imaging. FINDINGS: Selective celiac arteriogram demonstrates complete embolization of the gastroduodenal artery without residual flow. Selective right gastric arteriogram demonstrates patency and  tortuosity/narrowing of the origin and proximal aspect. The origin of the right gastric artery was again noted to be adjacent to the dominant left hepatic artery. Despite catheterization of the origin of the vessel and advancement of a microwire to its mid aspect, the microcatheter could not be advanced beyond the origin to allow for safe percutaneous coil embolization. 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 The microcatheter was placed proximal to the origin of the accessory right hepatic artery and Y 90 radioembolization was performed from this location. Intermittent contrast injections during and after the radioembolization confirming preserved antegrade flow without reflux. IMPRESSION: 1. Technically successful radioembolization of the right lobe of the liver with Yttrium-90 microspheres. 2. Attempted though unsuccessful coil embolization of the right gastric artery secondary to tortuosity of the vessel's origin and small caliber of the vessel. PLAN: - Given inability to successfully percutaneously coil embolized the right gastric artery, Y 90 radioembolization  of left lobe of the liver will NOT be pursued given proximity of the right gastric artery to the origin of the left hepatic artery and concern for nontarget embolization in this patient with progressive/advanced intra and extrahepatic metastatic disease. - The patient will be seen in follow-up consultation in the IR clinic in 3-4 weeks following acquisition of postprocedural CMP. - initial surveillance imaging will be performed in 3 months (likely with PET-CT). Electronically Signed   By: Sandi Mariscal M.D.   On: 07/27/2019 13:14   Dg Abd 2 Views  Result Date: 08/16/2019 CLINICAL DATA:  Worsening abdominal pain and constipation. History of right nephrectomy for renal cell carcinoma. Recent Y 90 procedure. EXAM: ABDOMEN - 2 VIEW  COMPARISON:  PET-CT 06/17/2019 FINDINGS: The lung bases are clear. No worrisome pulmonary lesions at the lung bases and no pleural effusions. There is some scattered stool in the colon but no findings for constipation. Paucity of small bowel gas. No findings for obstruction or perforation. The soft tissue shadows are grossly maintained. No worrisome calcifications. Arterial coils are noted in the central abdomen. Penile prosthesis is noted. The bony structures are unremarkable. No worrisome bone lesions. IMPRESSION: No plain film findings for an acute abdominal process. No findings for constipation or obstruction. Electronically Signed   By: Marijo Sanes M.D.   On: 08/16/2019 12:43   Ir Embo Tumor Organ Ischemia Infarct Inc Guide Roadmapping  Result Date: 07/27/2019 INDICATION: History of metastatic bladder cancer. Patient presents today for Y 90 radioembolization of the right lobe of the liver Note, pre Y 90 mapping radioembolization demonstrated a right gastric artery however this cannot be successfully coil embolized. As such will also attempt repeat coil embolization of the right gastric artery. EXAM: 1. ULTRASOUND GUIDANCE FOR ARTERIAL ACCESS 2. CELIAC ARTERIOGRAM 3. RIGHT GASTRIC ARTERIOGRAM 4. RIGHT HEPATIC AND RADIOEMBOLIZATION OF THE RIGHT LOBE OF THE LIVER 5. FLUOROSCOPIC GUIDED ADMINISTRATION OF SIR-SPHERES COMPARISON:  Mapping Y 90 radioembolization-07/13/2019; PET-CT-06/17/2019; CT abdomen pelvis-05/24/2019; abdominal MRI - 06/03/2019 MEDICATIONS: Protonix 40 mg IV; Decadron 20 mg IV; Toradol 30 mg IV; Zofran 4 mg IV CONTRAST:  80 cc OMNIPAQUE IOHEXOL 300 MG/ML  SOLN ANESTHESIA/SEDATION: Moderate (conscious) sedation was employed during this procedure. A total of Versed 3 mg and Fentanyl 200 mcg was administered intravenously. Moderate Sedation Time: 70 minutes. The patient's level of consciousness and vital signs were monitored continuously by radiology nursing throughout the procedure under my  direct supervision. FLUOROSCOPY TIME:  15 minutes, 30 seconds (1,208 mGy) ACCESS: Right common femoral artery; hemostasis achieved with manual compression. COMPLICATIONS: None immediate. TECHNIQUE: Informed written consent was obtained from the patient after a discussion of the risks, benefits and alternatives to treatment. Questions regarding the procedure were encouraged and answered. A timeout was performed prior to the initiation of the procedure. The right groin was prepped and draped in the usual sterile fashion, and a sterile drape was applied covering the operative field. Maximum barrier sterile technique with sterile gowns and gloves were used for the procedure. A timeout was performed prior to the initiation of the procedure. Local anesthesia was provided with 1% lidocaine. The right femoral head was marked fluoroscopically. Under direct ultrasound guidance, the right common femoral artery was accessed with a micropuncture kit allowing placement of a of 5-French vascular sheath. An ultrasound image was saved for documentation purposes. A limited arteriogram performed through the side arm of the sheath confirming appropriate access within the right common femoral artery. Over a Britta Mccreedy wire, a Mickelson catheter was  advanced to the level of the inferior thoracic aorta where it was reformed, back bled and flushed. The Mickelson catheter was utilized to select the celiac artery and a celiac arteriogram was performed. Next, with the use of a fathom 14 microwire, a regular Renegade microcatheter was utilized to select the right gastric artery. Despite successful cannulation of the origin the right gastric artery and advancement of a microwire to its mid aspect, the microcatheter would not track beyond the origin the vessel secondary to tortuosity of the vessel's origin as well as it's small caliber. Next, the microcatheter was advanced to the level of the right hepatic artery and a selective right hepatic  arteriogram was performed. Appropriate position was confirmed and the Radioembolization was then performed with Yttrium-90 SIR Spheres. Particles were administered via a microcatheter utilizing a completely enclosed system. Monitoring of antegrade flow was performed during and after the administration under fluoroscopy with use of contrast intermittently. After the administration of the particles, the microcatheter, outer catheter and administration system were then discarded into radiation safety receptacle. At this point, the procedure was terminated. The right common femoral approach vascular sheath was removed and hemostasis was achieved with manual compression. A dressing was placed. The patient tolerated procedure well without immediate postprocedural complication. All participants involved with the procedure were surveyed by the radiation safety officer prior to exiting the fluoroscopy suite. The patient was escorted to the nuclear medicine department for post-treatment imaging. FINDINGS: Selective celiac arteriogram demonstrates complete embolization of the gastroduodenal artery without residual flow. Selective right gastric arteriogram demonstrates patency and tortuosity/narrowing of the origin and proximal aspect. The origin of the right gastric artery was again noted to be adjacent to the dominant left hepatic artery. Despite catheterization of the origin of the vessel and advancement of a microwire to its mid aspect, the microcatheter could not be advanced beyond the origin to allow for safe percutaneous coil embolization. 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 The microcatheter was placed proximal to the origin of the accessory right hepatic artery and Y 90 radioembolization was performed from this location. Intermittent contrast injections during and after the radioembolization  confirming preserved antegrade flow without reflux. IMPRESSION: 1. Technically successful radioembolization of the right lobe of the liver with Yttrium-90 microspheres. 2. Attempted though unsuccessful coil embolization of the right gastric artery secondary to tortuosity of the vessel's origin and small caliber of the vessel. PLAN: - Given inability to successfully percutaneously coil embolized the right gastric artery, Y 90 radioembolization of left lobe of the liver will NOT be pursued given proximity of the right gastric artery to the origin of the left hepatic artery and concern for nontarget embolization in this patient with progressive/advanced intra and extrahepatic metastatic disease. - The patient will be seen in follow-up consultation in the IR clinic in 3-4 weeks following acquisition of postprocedural CMP. - initial surveillance imaging will be performed in 3 months (likely with PET-CT). Electronically Signed   By: Sandi Mariscal M.D.   On: 07/27/2019 13:14   Nm Radio Pharm Therapy Intraarterial  Result Date: 07/27/2019 CLINICAL DATA:  Unresectable liver metastasis. Bladder carcinoma primary. First treatment to the RIGHT hepatic lobe. EXAM: NUCLEAR MEDICINE SPECIAL MED RAD PHYSICS CONS; NUCLEAR MEDICINE RADIO PHARM THERAPY INTRA ARTERIAL; NUCLEAR MEDICINE TREATMENT PROCEDURE; NUCLEAR MEDICINE LIVER SCAN TECHNIQUE: In conjunction with the interventional radiologist a Y- Microsphere dose was calculated utilizing body surface area formulation.  Calculated dose equal 28.2 mCi. Pre therapy MAA liver SPECT scan and CTA were evaluated. Utilizing a microcatheter system, the RIGHT hepatic artery was selected and Y-90 microspheres were delivered in fractionated aliquots. Radiopharmaceutical was delivered by the interventional radiologist and nuclear radiologist. The patient tolerated procedure well. No adverse effects were noted. Bremsstrahlung planar and SPECT imaging of the abdomen following intrahepatic  arterial delivery of Y-90 microsphere was performed. RADIOPHARMACEUTICALS:  85.9 millicuries yttrium 90 microspheres. COMPARISON:  PET-CT 06/17/2019, MRI 06/03/2019 FINDINGS: Y - 90 microspheres therapy as above. First therapy the right hepatic lobe. Bremsstrahlung planar and SPECT imaging of the abdomen following intrahepatic arterial delivery of Y-38mcrosphere demonstrates radioactivity localized to the RIGHT hepatic lobe. No evidence of extrahepatic activity. IMPRESSION: Successful Y - 90 microsphere delivery for treatment of unresectable liver metastasis. First therapy to the RIGHT lobe. Bremssstrahlung scan demonstrates activity localized to RIGHT hepatic lobe with no extrahepatic activity identified. Electronically Signed   By: SSuzy BouchardM.D.   On: 07/27/2019 12:57    Assessment and plan- Patient is a 73y.o. 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  1.  Pain secondary to metastatic malignancy-scheduled to see radiation oncology tomorrow for consideration of palliative radiation to the right scapula.  Given abnormal liver function tests stop hydrocodone.  Continue fentanyl for long-acting pain control.  Start oxycodone 2.5-5 mg every 6 hours as needed for breakthrough pain.  2.  Opioid-induced constipation-increase MiraLAX and senna 2-3 times a day as needed for opioid-induced constipation.  Discussed goal is 1 bowel movement every day to every other day without pain or straining.  Disposition: Proceed with imaging as planned and follow-up with medical oncology following.  Return to symptom management clinic if symptoms do not improve or worsen.   Visit Diagnosis 1. Pain from bone metastases (HCC)   2. Constipation due to opioid therapy     Patient expressed understanding and was in agreement with this plan. He also understands that He can call clinic at any time with any questions, concerns, or  complaints.   I discussed the assessment and treatment plan with the patient. The patient was provided an opportunity to ask questions and all were answered. The patient agreed with the plan and demonstrated an understanding of the instructions.   The patient was advised to call back or seek an in-person evaluation if the symptoms worsen or if the condition fails to improve as anticipated.   I provided 18 minutes of face-to-face video visit time during this encounter, and > 50% was spent counseling as documented under my assessment & plan.  Thank you for allowing me to participate in the care of this very pleasant patient.   LBeckey Rutter DNP, AGNP-[facility]

## 2019-08-17 NOTE — Patient Instructions (Signed)
Oxycodone tablets or capsules What is this medicine? OXYCODONE (ox i KOE done) is a pain reliever. It is used to treat moderate to severe pain. This medicine may be used for other purposes; ask your health care provider or pharmacist if you have questions. COMMON BRAND NAME(S): Dazidox, Endocodone, Oxaydo, OXECTA, OxyIR, Percolone, Roxicodone, Roxybond What should I tell my health care provider before I take this medicine? They need to know if you have any of these conditions:  Addison's disease  brain tumor  head injury  heart disease  history of drug or alcohol abuse problem  if you often drink alcohol  kidney disease  liver disease  lung or breathing disease, like asthma  mental illness  pancreatic disease  seizures  thyroid disease  an unusual or allergic reaction to oxycodone, codeine, hydrocodone, morphine, other medicines, foods, dyes, or preservatives  pregnant or trying to get pregnant  breast-feeding How should I use this medicine? Take this medicine by mouth with a glass of water. Follow the directions on the prescription label. You can take it with or without food. If it upsets your stomach, take it with food. Take your medicine at regular intervals. Do not take it more often than directed. Do not stop taking except on your doctor's advice. Some brands of this medicine, like Oxecta, have special instructions. Ask your doctor or pharmacist if these directions are for you: Do not cut, crush or chew this medicine. Swallow only one tablet at a time. Do not wet, soak, or lick the tablet before you take it. A special MedGuide will be given to you by the pharmacist with each prescription and refill. Be sure to read this information carefully each time. Talk to your pediatrician regarding the use of this medicine in children. Special care may be needed. Overdosage: If you think you have taken too much of this medicine contact a poison control center or emergency room  at once. NOTE: This medicine is only for you. Do not share this medicine with others. What if I miss a dose? If you miss a dose, take it as soon as you can. If it is almost time for your next dose, take only that dose. Do not take double or extra doses. What may interact with this medicine? This medicine may interact with the following medications:  alcohol  antihistamines for allergy, cough and cold  antiviral medicines for HIV or AIDS  atropine  certain antibiotics like clarithromycin, erythromycin, linezolid, rifampin  certain medicines for anxiety or sleep  certain medicines for bladder problems like oxybutynin, tolterodine  certain medicines for depression like amitriptyline, fluoxetine, sertraline  certain medicines for fungal infections like ketoconazole, itraconazole, voriconazole  certain medicines for migraine headache like almotriptan, eletriptan, frovatriptan, naratriptan, rizatriptan, sumatriptan, zolmitriptan  certain medicines for nausea or vomiting like dolasetron, ondansetron, palonosetron  certain medicines for Parkinson's disease like benztropine, trihexyphenidyl  certain medicines for seizures like phenobarbital, phenytoin, primidone  certain medicines for stomach problems like dicyclomine, hyoscyamine  certain medicines for travel sickness like scopolamine  diuretics  general anesthetics like halothane, isoflurane, methoxyflurane, propofol  ipratropium  local anesthetics like lidocaine, pramoxine, tetracaine  MAOIs like Carbex, Eldepryl, Marplan, Nardil, and Parnate  medicines that relax muscles for surgery  methylene blue  nilotinib  other narcotic medicines for pain or cough  phenothiazines like chlorpromazine, mesoridazine, prochlorperazine, thioridazine This list may not describe all possible interactions. Give your health care provider a list of all the medicines, herbs, non-prescription drugs, or dietary supplements you  use. Also tell  them if you smoke, drink alcohol, or use illegal drugs. Some items may interact with your medicine. What should I watch for while using this medicine? Tell your doctor or health care professional if your pain does not go away, if it gets worse, or if you have new or a different type of pain. You may develop tolerance to the medicine. Tolerance means that you will need a higher dose of the medicine for pain relief. Tolerance is normal and is expected if you take this medicine for a long time. Do not suddenly stop taking your medicine because you may develop a severe reaction. Your body becomes used to the medicine. This does NOT mean you are addicted. Addiction is a behavior related to getting and using a drug for a non-medical reason. If you have pain, you have a medical reason to take pain medicine. Your doctor will tell you how much medicine to take. If your doctor wants you to stop the medicine, the dose will be slowly lowered over time to avoid any side effects. There are different types of narcotic medicines (opiates). If you take more than one type at the same time or if you are taking another medicine that also causes drowsiness, you may have more side effects. Give your health care provider a list of all medicines you use. Your doctor will tell you how much medicine to take. Do not take more medicine than directed. Call emergency for help if you have problems breathing or unusual sleepiness. You may get drowsy or dizzy. Do not drive, use machinery, or do anything that needs mental alertness until you know how the medicine affects you. Do not stand or sit up quickly, especially if you are an older patient. This reduces the risk of dizzy or fainting spells. Alcohol may interfere with the effect of this medicine. Avoid alcoholic drinks. This medicine will cause constipation. Try to have a bowel movement at least every 2 to 3 days. If you do not have a bowel movement for 3 days, call your doctor or health  care professional. Your mouth may get dry. Chewing sugarless gum or sucking hard candy, and drinking plenty of water may help. Contact your doctor if the problem does not go away or is severe. What side effects may I notice from receiving this medicine? Side effects that you should report to your doctor or health care professional as soon as possible:  allergic reactions like skin rash, itching or hives, swelling of the face, lips, or tongue  breathing problems  confusion  signs and symptoms of low blood pressure like dizziness; feeling faint or lightheaded, falls; unusually weak or tired  trouble passing urine or change in the amount of urine  trouble swallowing Side effects that usually do not require medical attention (report to your doctor or health care professional if they continue or are bothersome):  constipation  dry mouth  nausea, vomiting  tiredness This list may not describe all possible side effects. Call your doctor for medical advice about side effects. You may report side effects to FDA at 1-800-FDA-1088. Where should I keep my medicine? Keep out of the reach of children. This medicine can be abused. Keep your medicine in a safe place to protect it from theft. Do not share this medicine with anyone. Selling or giving away this medicine is dangerous and against the law. Store at room temperature between 15 and 30 degrees C (59 and 86 degrees F). Protect from light. Keep  container tightly closed. This medicine may cause harm and death if it is taken by other adults, children, or pets. Return medicine that has not been used to an official disposal site. Contact the DEA at (442)130-0374 or your city/county government to find a site. If you cannot return the medicine, flush it down the toilet. Do not use the medicine after the expiration date. NOTE: This sheet is a summary. It may not cover all possible information. If you have questions about this medicine, talk to your  doctor, pharmacist, or health care provider.  2020 Elsevier/Gold Standard (2017-01-28 16:13:10)

## 2019-08-18 ENCOUNTER — Ambulatory Visit
Admission: RE | Admit: 2019-08-18 | Discharge: 2019-08-18 | Disposition: A | Discharge status: Home / Self Care | Payer: Medicare Other | Source: Ambulatory Visit | Attending: Radiation Oncology | Admitting: Radiation Oncology

## 2019-08-18 ENCOUNTER — Other Ambulatory Visit: Payer: Self-pay

## 2019-08-18 ENCOUNTER — Encounter: Payer: Self-pay | Admitting: Radiation Oncology

## 2019-08-18 VITALS — BP 107/80 | HR 110 | Temp 98.3°F

## 2019-08-18 DIAGNOSIS — C681 Malignant neoplasm of paraurethral glands: Secondary | ICD-10-CM | POA: Diagnosis not present

## 2019-08-18 DIAGNOSIS — C7951 Secondary malignant neoplasm of bone: Secondary | ICD-10-CM | POA: Diagnosis not present

## 2019-08-18 DIAGNOSIS — Z923 Personal history of irradiation: Secondary | ICD-10-CM | POA: Diagnosis not present

## 2019-08-18 NOTE — Progress Notes (Signed)
Radiation Oncology Follow up Note  Name: David Rich   Date:   08/18/2019 MRN:  [id number] DOB: 1946-06-13    This 73 y.o. male presents to the clinic today for metastatic disease to right scapula in patient with known stage IV urothelial carcinoma recently treated with palliative radiation therapy to his lumbar spine with excellent palliative benefit.  REFERRING PROVIDER: Baxter Hire, MD  HPI: Patient is a 73 year old male well-known to our department recently completed palliative radiation therapy to his lumbar spine for stage IV urothelial carcinoma.  His pain completely resolved.  At this point he has progressive disease although his complaining about significant right shoulder pain.  He has PET positive metastatic disease in his lower portion of his right scapula.  He does have some limitation of motion in his right upper extremity..  Patient also has received Y 90 liver ablation several weeks prior.  Patient is currently on Keytruda tolerating that well without side effect.  COMPLICATIONS OF TREATMENT: none  FOLLOW UP COMPLIANCE: keeps appointments   PHYSICAL EXAM:  BP 107/80   Pulse (!) 110   Temp 98.3 F (36.8 C)  Frail-appearing male wheelchair-bound in moderate pain discomfort in his right shoulder.  Range of motion his right upper extremity is limited secondary to pain.  Well-developed well-nourished patient in NAD. HEENT reveals PERLA, EOMI, discs not visualized.  Oral cavity is clear. No oral mucosal lesions are identified. Neck is clear without evidence of cervical or supraclavicular adenopathy. Lungs are clear to A&P. Cardiac examination is essentially unremarkable with regular rate and rhythm without murmur rub or thrill. Abdomen is benign with no organomegaly or masses noted. Motor sensory and DTR levels are equal and symmetric in the upper and lower extremities. Cranial nerves II through XII are grossly intact. Proprioception is intact. No peripheral adenopathy or  edema is identified. No motor or sensory levels are noted. Crude visual fields are within normal range.  RADIOLOGY RESULTS: PET CT scan reviewed compatible with above-stated findings  PLAN: At the present time I have recommended a short hypofractionated course of palliative radiation therapy to his right scapula.  We will plan on delivering 2000 cGy in 5 fractions.  Risks and benefits of treatment including possible skin reaction and fatigue were discussed in detail with the patient.  He seems to comprehend my treatment plan well.  His son was present during my interview and consultation.  Patient is currently on Keytruda.  There will be extra effort by both professional staff as well as technical staff to coordinate and manage concurrent chemoradiation and ensuing side effects during his treatments.  I would like to take this opportunity to thank you for allowing me to participate in the care of your patient.Noreene Filbert, MD

## 2019-08-19 ENCOUNTER — Other Ambulatory Visit: Payer: Self-pay

## 2019-08-19 ENCOUNTER — Ambulatory Visit
Admission: RE | Admit: 2019-08-19 | Discharge: 2019-08-19 | Disposition: A | Discharge status: Home / Self Care | Payer: Medicare Other | Source: Ambulatory Visit | Attending: Radiation Oncology | Admitting: Radiation Oncology

## 2019-08-19 DIAGNOSIS — Z510 Encounter for antineoplastic radiation therapy: Secondary | ICD-10-CM | POA: Insufficient documentation

## 2019-08-19 DIAGNOSIS — C679 Malignant neoplasm of bladder, unspecified: Secondary | ICD-10-CM | POA: Diagnosis present

## 2019-08-19 DIAGNOSIS — C7951 Secondary malignant neoplasm of bone: Secondary | ICD-10-CM | POA: Diagnosis present

## 2019-08-20 ENCOUNTER — Ambulatory Visit: Admission: RE | Admit: 2019-08-20 | Payer: Medicare Other | Source: Ambulatory Visit

## 2019-08-20 ENCOUNTER — Other Ambulatory Visit: Payer: Medicare Other

## 2019-08-20 ENCOUNTER — Ambulatory Visit: Payer: Medicare Other

## 2019-08-20 ENCOUNTER — Ambulatory Visit
Admission: RE | Admit: 2019-08-20 | Discharge: 2019-08-20 | Disposition: A | Discharge status: Home / Self Care | Payer: Medicare Other | Source: Ambulatory Visit | Attending: Nurse Practitioner | Admitting: Nurse Practitioner

## 2019-08-20 ENCOUNTER — Other Ambulatory Visit: Payer: Self-pay

## 2019-08-20 DIAGNOSIS — Z510 Encounter for antineoplastic radiation therapy: Secondary | ICD-10-CM | POA: Diagnosis not present

## 2019-08-20 DIAGNOSIS — D494 Neoplasm of unspecified behavior of bladder: Secondary | ICD-10-CM | POA: Diagnosis not present

## 2019-08-20 DIAGNOSIS — C7951 Secondary malignant neoplasm of bone: Secondary | ICD-10-CM | POA: Diagnosis not present

## 2019-08-20 DIAGNOSIS — R188 Other ascites: Secondary | ICD-10-CM | POA: Diagnosis not present

## 2019-08-20 DIAGNOSIS — R918 Other nonspecific abnormal finding of lung field: Secondary | ICD-10-CM | POA: Insufficient documentation

## 2019-08-20 DIAGNOSIS — C787 Secondary malignant neoplasm of liver and intrahepatic bile duct: Secondary | ICD-10-CM | POA: Insufficient documentation

## 2019-08-20 DIAGNOSIS — R590 Localized enlarged lymph nodes: Secondary | ICD-10-CM | POA: Insufficient documentation

## 2019-08-20 MED ORDER — IOHEXOL 300 MG/ML  SOLN
100.0000 mL | Freq: Once | INTRAMUSCULAR | Status: AC | PRN
  Administered 2019-08-20: 100 mL via INTRAVENOUS

## 2019-08-20 MED ORDER — IOHEXOL 300 MG/ML  SOLN: 100.0000 mL | Freq: Once | INTRAMUSCULAR | Status: DC | PRN

## 2019-08-20 NOTE — Progress Notes (Signed)
Patient pre screened for office appointment, no questions or concerns today. Patient reminded of upcoming appointment time and date. 

## 2019-08-20 NOTE — Progress Notes (Signed)
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  INTERVAL HISTORY: Patient returns to clinic today for further evaluation and discussion of his imaging results.  He has worsening ascites, abdominal pain, and bloating.  He continues to have chronic weakness and fatigue.  He has a poor appetite. He has no neurologic complaints.  He denies any recent fevers or illnesses.  He denies any chest pain, shortness of breath, cough, or hemoptysis.  He denies any constipation or diarrhea.  He has no urinary complaints.  Patient offers no further specific complaints today.    REVIEW OF SYSTEMS:   Review of Systems  Constitutional: Positive for malaise/fatigue. Negative for fever and weight loss.  Respiratory: Negative.  Negative for cough and shortness of breath.   Cardiovascular: Negative.  Negative for chest pain and leg swelling.  Gastrointestinal: Positive for abdominal pain and nausea. Negative for constipation, diarrhea and vomiting.  Genitourinary: Negative.  Negative for dysuria and hematuria.  Musculoskeletal: Negative.  Negative for back pain.  Skin: Negative.  Negative for rash.  Neurological: Positive for weakness. Negative for sensory change, focal weakness and headaches.  Psychiatric/Behavioral: Negative.  The patient is not nervous/anxious and does not have insomnia.     As per HPI. Otherwise, a complete review of systems is negative.  PAST MEDICAL HISTORY: Past Medical History:  Diagnosis Date   Cancer [facility]) 2013   Right Renal    Hyperlipidemia    Hypertension    Renal disorder     Urothelial carcinoma of bladder ([place]) 2020    PAST SURGICAL HISTORY: Past Surgical History:  Procedure Laterality Date   CORONARY STENT INTERVENTION N/A 09/28/2018   Procedure: CORONARY STENT INTERVENTION;  Surgeon: [name], MD;  Location: [facility];  Service: Cardiovascular;  Laterality: N/A;   CORONARY STENT PLACEMENT     IR ANGIOGRAM SELECTIVE EACH ADDITIONAL VESSEL  07/13/2019   IR ANGIOGRAM SELECTIVE EACH ADDITIONAL VESSEL  07/13/2019   IR ANGIOGRAM SELECTIVE EACH ADDITIONAL VESSEL  07/13/2019   IR ANGIOGRAM SELECTIVE EACH ADDITIONAL VESSEL  07/13/2019   IR ANGIOGRAM SELECTIVE EACH ADDITIONAL VESSEL  07/13/2019   IR ANGIOGRAM SELECTIVE EACH ADDITIONAL VESSEL  07/13/2019   IR ANGIOGRAM SELECTIVE EACH ADDITIONAL VESSEL  07/27/2019   IR ANGIOGRAM SELECTIVE EACH ADDITIONAL VESSEL  07/27/2019   IR ANGIOGRAM VISCERAL SELECTIVE  07/13/2019   IR ANGIOGRAM VISCERAL SELECTIVE  07/27/2019   IR EMBO ARTERIAL NOT HEMORR HEMANG INC GUIDE ROADMAPPING  07/13/2019   IR EMBO TUMOR ORGAN ISCHEMIA INFARCT INC GUIDE ROADMAPPING  07/27/2019   IR FLUORO GUIDE CV LINE RIGHT  04/03/2018   IR RADIOLOGIST EVAL & MGMT  06/01/2019   IR RADIOLOGIST EVAL & MGMT  06/09/2019   IR US GUIDE VASC ACCESS RIGHT  07/13/2019   IR US GUIDE VASC ACCESS RIGHT  07/27/2019   kidney removed Left 2013   LEFT HEART CATH AND CORONARY ANGIOGRAPHY N/A 09/28/2018   Procedure: LEFT HEART CATH AND CORONARY ANGIOGRAPHY;  Surgeon: [name], MD;  Location: [facility];  Service: Cardiovascular;  Laterality: N/A;    FAMILY HISTORY: Family History  Problem Relation Age of Onset   Coronary  artery disease Mother    Anemia Mother    Kidney failure Mother    Subarachnoid hemorrhage Father    Breast cancer Sister 29   Hypertension Brother     ADVANCED DIRECTIVES (Y/N):  N  HEALTH MAINTENANCE: Social History   Tobacco Use   Smoking status: Former Smoker    Packs/day: 1.00      Years: 55.00    Pack years: 55.00    Types: Cigarettes    Quit date: 03/20/2012    Years since quitting: 7.4   Smokeless tobacco: Never Used  Substance Use Topics   Alcohol use: Yes    Comment: Social   Drug use: No     Colonoscopy:  PAP:  Bone density:  Lipid panel:  Allergies  Allergen Reactions   Clopidogrel Rash and Hives   Rosuvastatin Calcium Anaphylaxis   Etodolac Nausea And Vomiting and Other (See Comments)    Bad dreams Bad dreams    Hydrocodone Other (See Comments)    Bad dreams Bad dreams    Other    Statins    Meloxicam Rash    Bad dreams Bad dreams    Pravastatin Sodium Rash    Current Outpatient Medications  Medication Sig Dispense Refill   ALPRAZolam (XANAX) 0.25 MG tablet      amLODipine (NORVASC) 10 MG tablet TAKE 1 TABLET BY MOUTH EVERY DAY     aspirin EC 81 MG tablet Take by mouth.     carvedilol (COREG) 6.25 MG tablet Take 1 tablet (6.25 mg total) by mouth 2 (two) times daily. 60 tablet 6   clopidogrel (PLAVIX) 75 MG tablet Take 75 mg by mouth daily.     docusate sodium (COLACE) 100 MG capsule Take 100 mg by mouth 2 (two) times daily as needed for mild constipation. 2 in am, 3 in pm     ezetimibe (ZETIA) 10 MG tablet TAKE 1 TABLET BY MOUTH DAILY 90 tablet 0   famotidine (PEPCID) 20 MG tablet Take 20 mg by mouth 2 (two) times daily.      fentaNYL (DURAGESIC) 25 MCG/HR Place 1 patch onto the skin every 3 (three) days.     Garlic 798 MG TABS Take by mouth.      HYDROcodone-acetaminophen (NORCO/VICODIN) 5-325 MG tablet Take 1 tablet by mouth every 6 (six) hours as needed for moderate pain. 120 tablet 0   magnesium hydroxide (MILK OF MAGNESIA) 400 MG/5ML suspension Take 30 mLs by mouth.     megestrol (MEGACE) 20 MG tablet Take 1 tablet (20 mg total) by mouth daily. 30 tablet 1   metoCLOPramide (REGLAN) 10 MG tablet Take 1 tablet (10 mg total) by mouth 4 (four) times daily. 30 tablet 1   Multiple Vitamin (MULTI-VITAMINS)  TABS Take by mouth.     nitroGLYCERIN (NITROSTAT) 0.4 MG SL tablet Place under the tongue.     Omega-3 Fatty Acids (FISH OIL OMEGA-3) 1000 MG CAPS Take by mouth daily.     ondansetron (ZOFRAN) 8 MG tablet Take 1 tablet (8 mg total) by mouth every 8 (eight) hours as needed for nausea or vomiting. 30 tablet 3   oxyCODONE (OXY IR/ROXICODONE) 5 MG immediate release tablet Take 0.5-1 tablets (2.5-5 mg total) by mouth every 6 (six) hours as needed for moderate pain, severe pain or breakthrough pain. 120 tablet 0   polyethylene glycol (MIRALAX / GLYCOLAX) 17 g packet Take 17 g by mouth 2 (two) times daily.      prochlorperazine (COMPAZINE) 10 MG tablet  Take 1 tablet (10 mg total) by mouth every 6 (six) hours as needed for nausea or vomiting. 30 tablet 0   sucralfate (CARAFATE) 1 g tablet Take 1 tablet (1 g total) by mouth 3 (three) times daily. 90 tablet 3   traZODone (DESYREL) 50 MG tablet Take 1-2 tablets (50-100 mg total) by mouth at bedtime. 30 tablet 0   No current facility-administered medications for this visit.    Facility-Administered Medications Ordered in Other Visits  Medication Dose Route Frequency Provider Last Rate Last Dose   sodium chloride flush (NS) 0.9 % injection 10 mL  10 mL Intravenous PRN [name], MD   10 mL at 10/05/18 1026    OBJECTIVE: Vitals:   08/23/19 0913  BP: 95/67  Pulse: (!) 103  Resp: 18  Temp: (!) 95.7 F (35.4 C)  SpO2: 100%     Body mass index is 22.65 kg/m.    ECOG FS:0 - Asymptomatic   General: Thin, no acute distress.  Sitting in wheelchair. Eyes: Pink conjunctiva, anicteric sclera. HEENT: Normocephalic, moist mucous membranes, clear oropharnyx. Lungs: Clear to auscultation bilaterally. Heart: Regular rate and rhythm. No rubs, murmurs, or gallops. Abdomen: Distended, nontender. Musculoskeletal: 2+ bilateral lower extremity edema. Neuro: Alert, answering all questions appropriately. Cranial nerves grossly intact. Skin: No  rashes or petechiae noted. Psych: Normal affect.  LAB RESULTS:  Lab Results  Component Value Date   NA 128 (L) 08/23/2019   K 5.6 (H) 08/23/2019   CL 100 08/23/2019   CO2 16 (L) 08/23/2019   GLUCOSE 97 08/23/2019   BUN 29 (H) 08/23/2019   CREATININE 0.83 08/23/2019   CALCIUM 9.6 08/23/2019   PROT 5.7 (L) 08/23/2019   ALBUMIN 2.4 (L) 08/23/2019   AST 173 (H) 08/23/2019   ALT 89 (H) 08/23/2019   ALKPHOS 409 (H) 08/23/2019   BILITOT 1.7 (H) 08/23/2019   GFRNONAA >60 08/23/2019   GFRAA >60 08/23/2019    Lab Results  Component Value Date   WBC 12.3 (H) 08/23/2019   NEUTROABS 10.3 (H) 08/23/2019   HGB 11.0 (L) 08/23/2019   HCT 35.5 (L) 08/23/2019   MCV 95.2 08/23/2019   PLT 107 (L) 08/23/2019     STUDIES: Ct Chest W Contrast  Result Date: 08/20/2019 CLINICAL DATA:  Restaging bladder cancer EXAM: CT CHEST, ABDOMEN, AND PELVIS WITH CONTRAST TECHNIQUE: Multidetector CT imaging of the chest, abdomen and pelvis was performed following the standard protocol during bolus administration of intravenous contrast. CONTRAST:  168m OMNIPAQUE IOHEXOL 300 MG/ML  SOLN COMPARISON:  PET-CT 06/17/2019 FINDINGS: CT CHEST FINDINGS Cardiovascular: Normal heart size. No pericardial effusion identified. Aortic atherosclerosis. Three vessel coronary artery atherosclerosis. Mediastinum/Nodes: Normal appearance of the thyroid gland. The trachea appears patent and is midline. Mild circumferential wall thickening of the distal esophagus noted. Subcarinal lymph node measures 1.4 cm, image 31/2. This is compared with 0.7 cm previously. Right paratracheal lymph node has lost its normal fatty hilum and now measures 0.9 cm, image 22/2. Previously 0.8 cm. Left supraclavicular lymph node measures 1 cm, image 4/2. Previously this measured the same. Lungs/Pleura: No pleural effusion identified. No airspace consolidation. Multiple pulmonary nodules are identified within both lungs. A number of these are increased in  size in the interval including: 6 mm lateral right upper lobe lung nodule identified, image 42/4. Previously 3 mm. Anteromedial right upper lobe lung nodule measures 0.8 cm, image 77/4. Previously 0.7 cm. Medial right lung base nodule measures 1.1 cm, image 119/4. New from previous exam. A  number of nodules appear less solid than on the previous exam including a 1.5 cm right middle lobe lung nodule, image 12/4. Musculoskeletal: There is a lytic lesion involving the inferior right scapula measuring approximately 4.8 cm transverse, image 61/4. On the recent PET-CT this was FDG avid with normal bone mineralization. CT ABDOMEN PELVIS FINDINGS Hepatobiliary: Hepatomegaly. Multifocal liver metastases are identified. Confluent lesion spanning the medial and lateral segment of left lobe of liver measures 21.4 x 10.7 cm, image 60/2. Previously this measured 6.7 x 9.1 cm. Segment 5 lesion measures 5.2 x 4.5 cm, image 78/2. Previously 3.3 x 3.2 cm. Lesion in segment 6 measures 4.9 x 3.8 cm, image 84/2. Previously 3.5 x 2.4 cm. Gallbladder unremarkable. No biliary dilatation. Pancreas: Unremarkable. No pancreatic ductal dilatation or surrounding inflammatory changes. Spleen: Normal in size without focal abnormality. Adrenals/Urinary Tract: Normal adrenal glands. Right nephrectomy. Unremarkable appearance of the left kidney. No left-sided hydronephrosis. Urinary bladder is unremarkable. Stomach/Bowel: Stomach is within normal limits. Appendix appears normal. No evidence of bowel wall thickening, distention, or inflammatory changes. Extensive distal colonic diverticulosis without acute inflammation. Vascular/Lymphatic: Aortic atherosclerosis. There is no aneurysm. Upper abdominal adenopathy is again noted. Index left periaortic lymph node measures 1.6 cm, image 76/2. Unchanged. Gastrohepatic ligament node measures 1.3 cm, image 66/2. New. Left common iliac node measures 1.4 cm, image 95/2. New from previous exam. Right external  iliac node measures 1.4 cm, image 110/2. Previously this measured the same. Reproductive: Prostate is unremarkable.  Penile prosthesis. Other: There is a moderate volume of ascites identified within the abdomen and pelvis, new from previous exam. Musculoskeletal: Mixed lytic and sclerotic bone metastases involving the L2 vertebra has progressed in the interval. There is a new lucent lesion within the L2 vertebra which measures approximately 1.6 cm. Lytic lesion involving the superior endplate of L3 measures 1.9 cm. New from previous exam. IMPRESSION: 1. Significant interval progression of liver metastases. 2. Progression of bone metastases involving L2 vertebral body and right scapula. New lytic lesion involves the L3 vertebra. 3. Pulmonary nodules show a mixed response compared with previous exam. 4. New enlarged subcarinal lymph node compatible with metastatic adenopathy. 5. Abdominal and pelvic ascites, new from previous exam. Electronically Signed   By: Kerby Moors M.D.   On: 08/20/2019 14:05   Nm Liver Tumor Loc Imflam Spect 1 Day  Result Date: 07/27/2019 CLINICAL DATA:  Unresectable liver metastasis. Bladder carcinoma primary. First treatment to the RIGHT hepatic lobe. EXAM: NUCLEAR MEDICINE SPECIAL MED RAD PHYSICS CONS; NUCLEAR MEDICINE RADIO PHARM THERAPY INTRA ARTERIAL; NUCLEAR MEDICINE TREATMENT PROCEDURE; NUCLEAR MEDICINE LIVER SCAN TECHNIQUE: In conjunction with the interventional radiologist a Y- Microsphere dose was calculated utilizing body surface area formulation. Calculated dose equal 28.2 mCi. Pre therapy MAA liver SPECT scan and CTA were evaluated. Utilizing a microcatheter system, the RIGHT hepatic artery was selected and Y-90 microspheres were delivered in fractionated aliquots. Radiopharmaceutical was delivered by the interventional radiologist and nuclear radiologist. The patient tolerated procedure well. No adverse effects were noted. Bremsstrahlung planar and SPECT imaging of the  abdomen following intrahepatic arterial delivery of Y-90 microsphere was performed. RADIOPHARMACEUTICALS:  40.3 millicuries yttrium 90 microspheres. COMPARISON:  PET-CT 06/17/2019, MRI 06/03/2019 FINDINGS: Y - 90 microspheres therapy as above. First therapy the right hepatic lobe. Bremsstrahlung planar and SPECT imaging of the abdomen following intrahepatic arterial delivery of Y-57mcrosphere demonstrates radioactivity localized to the RIGHT hepatic lobe. No evidence of extrahepatic activity. IMPRESSION: Successful Y - 90 microsphere delivery for treatment of unresectable liver  metastasis. First therapy to the RIGHT lobe. Bremssstrahlung scan demonstrates activity localized to RIGHT hepatic lobe with no extrahepatic activity identified. Electronically Signed   By: Suzy Bouchard M.D.   On: 07/27/2019 12:57   Ct Abdomen Pelvis W Contrast  Result Date: 08/20/2019 CLINICAL DATA:  Restaging bladder cancer EXAM: CT CHEST, ABDOMEN, AND PELVIS WITH CONTRAST TECHNIQUE: Multidetector CT imaging of the chest, abdomen and pelvis was performed following the standard protocol during bolus administration of intravenous contrast. CONTRAST:  167m OMNIPAQUE IOHEXOL 300 MG/ML  SOLN COMPARISON:  PET-CT 06/17/2019 FINDINGS: CT CHEST FINDINGS Cardiovascular: Normal heart size. No pericardial effusion identified. Aortic atherosclerosis. Three vessel coronary artery atherosclerosis. Mediastinum/Nodes: Normal appearance of the thyroid gland. The trachea appears patent and is midline. Mild circumferential wall thickening of the distal esophagus noted. Subcarinal lymph node measures 1.4 cm, image 31/2. This is compared with 0.7 cm previously. Right paratracheal lymph node has lost its normal fatty hilum and now measures 0.9 cm, image 22/2. Previously 0.8 cm. Left supraclavicular lymph node measures 1 cm, image 4/2. Previously this measured the same. Lungs/Pleura: No pleural effusion identified. No airspace consolidation. Multiple  pulmonary nodules are identified within both lungs. A number of these are increased in size in the interval including: 6 mm lateral right upper lobe lung nodule identified, image 42/4. Previously 3 mm. Anteromedial right upper lobe lung nodule measures 0.8 cm, image 77/4. Previously 0.7 cm. Medial right lung base nodule measures 1.1 cm, image 119/4. New from previous exam. A number of nodules appear less solid than on the previous exam including a 1.5 cm right middle lobe lung nodule, image 12/4. Musculoskeletal: There is a lytic lesion involving the inferior right scapula measuring approximately 4.8 cm transverse, image 61/4. On the recent PET-CT this was FDG avid with normal bone mineralization. CT ABDOMEN PELVIS FINDINGS Hepatobiliary: Hepatomegaly. Multifocal liver metastases are identified. Confluent lesion spanning the medial and lateral segment of left lobe of liver measures 21.4 x 10.7 cm, image 60/2. Previously this measured 6.7 x 9.1 cm. Segment 5 lesion measures 5.2 x 4.5 cm, image 78/2. Previously 3.3 x 3.2 cm. Lesion in segment 6 measures 4.9 x 3.8 cm, image 84/2. Previously 3.5 x 2.4 cm. Gallbladder unremarkable. No biliary dilatation. Pancreas: Unremarkable. No pancreatic ductal dilatation or surrounding inflammatory changes. Spleen: Normal in size without focal abnormality. Adrenals/Urinary Tract: Normal adrenal glands. Right nephrectomy. Unremarkable appearance of the left kidney. No left-sided hydronephrosis. Urinary bladder is unremarkable. Stomach/Bowel: Stomach is within normal limits. Appendix appears normal. No evidence of bowel wall thickening, distention, or inflammatory changes. Extensive distal colonic diverticulosis without acute inflammation. Vascular/Lymphatic: Aortic atherosclerosis. There is no aneurysm. Upper abdominal adenopathy is again noted. Index left periaortic lymph node measures 1.6 cm, image 76/2. Unchanged. Gastrohepatic ligament node measures 1.3 cm, image 66/2. New. Left  common iliac node measures 1.4 cm, image 95/2. New from previous exam. Right external iliac node measures 1.4 cm, image 110/2. Previously this measured the same. Reproductive: Prostate is unremarkable.  Penile prosthesis. Other: There is a moderate volume of ascites identified within the abdomen and pelvis, new from previous exam. Musculoskeletal: Mixed lytic and sclerotic bone metastases involving the L2 vertebra has progressed in the interval. There is a new lucent lesion within the L2 vertebra which measures approximately 1.6 cm. Lytic lesion involving the superior endplate of L3 measures 1.9 cm. New from previous exam. IMPRESSION: 1. Significant interval progression of liver metastases. 2. Progression of bone metastases involving L2 vertebral body and right scapula. New  lytic lesion involves the L3 vertebra. 3. Pulmonary nodules show a mixed response compared with previous exam. 4. New enlarged subcarinal lymph node compatible with metastatic adenopathy. 5. Abdominal and pelvic ascites, new from previous exam. Electronically Signed   By: Kerby Moors M.D.   On: 08/20/2019 14:05   Ir Angiogram Visceral Selective  Result Date: 07/27/2019 INDICATION: History of metastatic bladder cancer. Patient presents today for Y 90 radioembolization of the right lobe of the liver Note, pre Y 90 mapping radioembolization demonstrated a right gastric artery however this cannot be successfully coil embolized. As such will also attempt repeat coil embolization of the right gastric artery. EXAM: 1. ULTRASOUND GUIDANCE FOR ARTERIAL ACCESS 2. CELIAC ARTERIOGRAM 3. RIGHT GASTRIC ARTERIOGRAM 4. RIGHT HEPATIC AND RADIOEMBOLIZATION OF THE RIGHT LOBE OF THE LIVER 5. FLUOROSCOPIC GUIDED ADMINISTRATION OF SIR-SPHERES COMPARISON:  Mapping Y 90 radioembolization-07/13/2019; PET-CT-06/17/2019; CT abdomen pelvis-05/24/2019; abdominal MRI - 06/03/2019 MEDICATIONS: Protonix 40 mg IV; Decadron 20 mg IV; Toradol 30 mg IV; Zofran 4 mg IV  CONTRAST:  80 cc OMNIPAQUE IOHEXOL 300 MG/ML  SOLN ANESTHESIA/SEDATION: Moderate (conscious) sedation was employed during this procedure. A total of Versed 3 mg and Fentanyl 200 mcg was administered intravenously. Moderate Sedation Time: 70 minutes. The patient's level of consciousness and vital signs were monitored continuously by radiology nursing throughout the procedure under my direct supervision. FLUOROSCOPY TIME:  15 minutes, 30 seconds (1,208 mGy) ACCESS: Right common femoral artery; hemostasis achieved with manual compression. COMPLICATIONS: None immediate. TECHNIQUE: Informed written consent was obtained from the patient after a discussion of the risks, benefits and alternatives to treatment. Questions regarding the procedure were encouraged and answered. A timeout was performed prior to the initiation of the procedure. The right groin was prepped and draped in the usual sterile fashion, and a sterile drape was applied covering the operative field. Maximum barrier sterile technique with sterile gowns and gloves were used for the procedure. A timeout was performed prior to the initiation of the procedure. Local anesthesia was provided with 1% lidocaine. The right femoral head was marked fluoroscopically. Under direct ultrasound guidance, the right common femoral artery was accessed with a micropuncture kit allowing placement of a of 5-French vascular sheath. An ultrasound image was saved for documentation purposes. A limited arteriogram performed through the side arm of the sheath confirming appropriate access within the right common femoral artery. Over a Britta Mccreedy wire, a Mickelson catheter was advanced to the level of the inferior thoracic aorta where it was reformed, back bled and flushed. The Mickelson catheter was utilized to select the celiac artery and a celiac arteriogram was performed. Next, with the use of a fathom 14 microwire, a regular Renegade microcatheter was utilized to select the right  gastric artery. Despite successful cannulation of the origin the right gastric artery and advancement of a microwire to its mid aspect, the microcatheter would not track beyond the origin the vessel secondary to tortuosity of the vessel's origin as well as it's small caliber. Next, the microcatheter was advanced to the level of the right hepatic artery and a selective right hepatic arteriogram was performed. Appropriate position was confirmed and the Radioembolization was then performed with Yttrium-90 SIR Spheres. Particles were administered via a microcatheter utilizing a completely enclosed system. Monitoring of antegrade flow was performed during and after the administration under fluoroscopy with use of contrast intermittently. After the administration of the particles, the microcatheter, outer catheter and administration system were then discarded into radiation safety receptacle. At this point, the procedure  was terminated. The right common femoral approach vascular sheath was removed and hemostasis was achieved with manual compression. A dressing was placed. The patient tolerated procedure well without immediate postprocedural complication. All participants involved with the procedure were surveyed by the radiation safety officer prior to exiting the fluoroscopy suite. The patient was escorted to the nuclear medicine department for post-treatment imaging. FINDINGS: Selective celiac arteriogram demonstrates complete embolization of the gastroduodenal artery without residual flow. Selective right gastric arteriogram demonstrates patency and tortuosity/narrowing of the origin and proximal aspect. The origin of the right gastric artery was again noted to be adjacent to the dominant left hepatic artery. Despite catheterization of the origin of the vessel and advancement of a microwire to its mid aspect, the microcatheter could not be advanced beyond the origin to allow for safe percutaneous coil embolization.  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 The microcatheter was placed proximal to the origin of the accessory right hepatic artery and Y 90 radioembolization was performed from this location. Intermittent contrast injections during and after the radioembolization confirming preserved antegrade flow without reflux. IMPRESSION: 1. Technically successful radioembolization of the right lobe of the liver with Yttrium-90 microspheres. 2. Attempted though unsuccessful coil embolization of the right gastric artery secondary to tortuosity of the vessel's origin and small caliber of the vessel. PLAN: - Given inability to successfully percutaneously coil embolized the right gastric artery, Y 90 radioembolization of left lobe of the liver will NOT be pursued given proximity of the right gastric artery to the origin of the left hepatic artery and concern for nontarget embolization in this patient with progressive/advanced intra and extrahepatic metastatic disease. - The patient will be seen in follow-up consultation in the IR clinic in 3-4 weeks following acquisition of postprocedural CMP. - initial surveillance imaging will be performed in 3 months (likely with PET-CT). Electronically Signed   By: Sandi Mariscal M.D.   On: 07/27/2019 13:14   Ir Angiogram Selective Each Additional Vessel  Result Date: 07/27/2019 INDICATION: History of metastatic bladder cancer. Patient presents today for Y 90 radioembolization of the right lobe of the liver Note, pre Y 90 mapping radioembolization demonstrated a right gastric artery however this cannot be successfully coil embolized. As such will also attempt repeat coil embolization of the right gastric artery. EXAM: 1. ULTRASOUND GUIDANCE FOR ARTERIAL ACCESS 2. CELIAC ARTERIOGRAM 3. RIGHT GASTRIC ARTERIOGRAM 4. RIGHT HEPATIC AND RADIOEMBOLIZATION OF THE RIGHT LOBE OF THE  LIVER 5. FLUOROSCOPIC GUIDED ADMINISTRATION OF SIR-SPHERES COMPARISON:  Mapping Y 90 radioembolization-07/13/2019; PET-CT-06/17/2019; CT abdomen pelvis-05/24/2019; abdominal MRI - 06/03/2019 MEDICATIONS: Protonix 40 mg IV; Decadron 20 mg IV; Toradol 30 mg IV; Zofran 4 mg IV CONTRAST:  80 cc OMNIPAQUE IOHEXOL 300 MG/ML  SOLN ANESTHESIA/SEDATION: Moderate (conscious) sedation was employed during this procedure. A total of Versed 3 mg and Fentanyl 200 mcg was administered intravenously. Moderate Sedation Time: 70 minutes. The patient's level of consciousness and vital signs were monitored continuously by radiology nursing throughout the procedure under my direct supervision. FLUOROSCOPY TIME:  15 minutes, 30 seconds (1,208 mGy) ACCESS: Right common femoral artery; hemostasis achieved with manual compression. COMPLICATIONS: None immediate. TECHNIQUE: Informed written consent was obtained from the patient after a discussion of the risks, benefits and alternatives to treatment. Questions regarding the procedure were encouraged and answered. A timeout was performed prior to the initiation of the procedure. The right groin was prepped and draped in  the usual sterile fashion, and a sterile drape was applied covering the operative field. Maximum barrier sterile technique with sterile gowns and gloves were used for the procedure. A timeout was performed prior to the initiation of the procedure. Local anesthesia was provided with 1% lidocaine. The right femoral head was marked fluoroscopically. Under direct ultrasound guidance, the right common femoral artery was accessed with a micropuncture kit allowing placement of a of 5-French vascular sheath. An ultrasound image was saved for documentation purposes. A limited arteriogram performed through the side arm of the sheath confirming appropriate access within the right common femoral artery. Over a Britta Mccreedy wire, a Mickelson catheter was advanced to the level of the inferior  thoracic aorta where it was reformed, back bled and flushed. The Mickelson catheter was utilized to select the celiac artery and a celiac arteriogram was performed. Next, with the use of a fathom 14 microwire, a regular Renegade microcatheter was utilized to select the right gastric artery. Despite successful cannulation of the origin the right gastric artery and advancement of a microwire to its mid aspect, the microcatheter would not track beyond the origin the vessel secondary to tortuosity of the vessel's origin as well as it's small caliber. Next, the microcatheter was advanced to the level of the right hepatic artery and a selective right hepatic arteriogram was performed. Appropriate position was confirmed and the Radioembolization was then performed with Yttrium-90 SIR Spheres. Particles were administered via a microcatheter utilizing a completely enclosed system. Monitoring of antegrade flow was performed during and after the administration under fluoroscopy with use of contrast intermittently. After the administration of the particles, the microcatheter, outer catheter and administration system were then discarded into radiation safety receptacle. At this point, the procedure was terminated. The right common femoral approach vascular sheath was removed and hemostasis was achieved with manual compression. A dressing was placed. The patient tolerated procedure well without immediate postprocedural complication. All participants involved with the procedure were surveyed by the radiation safety officer prior to exiting the fluoroscopy suite. The patient was escorted to the nuclear medicine department for post-treatment imaging. FINDINGS: Selective celiac arteriogram demonstrates complete embolization of the gastroduodenal artery without residual flow. Selective right gastric arteriogram demonstrates patency and tortuosity/narrowing of the origin and proximal aspect. The origin of the right gastric artery was  again noted to be adjacent to the dominant left hepatic artery. Despite catheterization of the origin of the vessel and advancement of a microwire to its mid aspect, the microcatheter could not be advanced beyond the origin to allow for safe percutaneous coil embolization. 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 The microcatheter was placed proximal to the origin of the accessory right hepatic artery and Y 90 radioembolization was performed from this location. Intermittent contrast injections during and after the radioembolization confirming preserved antegrade flow without reflux. IMPRESSION: 1. Technically successful radioembolization of the right lobe of the liver with Yttrium-90 microspheres. 2. Attempted though unsuccessful coil embolization of the right gastric artery secondary to tortuosity of the vessel's origin and small caliber of the vessel. PLAN: - Given inability to successfully percutaneously coil embolized the right gastric artery, Y 90 radioembolization of left lobe of the liver will NOT be pursued given proximity of the right gastric artery to the origin of the left hepatic artery and concern for nontarget embolization in this patient with progressive/advanced intra and extrahepatic metastatic disease. - The patient will  be seen in follow-up consultation in the IR clinic in 3-4 weeks following acquisition of postprocedural CMP. - initial surveillance imaging will be performed in 3 months (likely with PET-CT). Electronically Signed   By: Sandi Mariscal M.D.   On: 07/27/2019 13:14   Ir Angiogram Selective Each Additional Vessel  Result Date: 07/27/2019 INDICATION: History of metastatic bladder cancer. Patient presents today for Y 90 radioembolization of the right lobe of the liver Note, pre Y 90 mapping radioembolization demonstrated a right gastric artery however this cannot be  successfully coil embolized. As such will also attempt repeat coil embolization of the right gastric artery. EXAM: 1. ULTRASOUND GUIDANCE FOR ARTERIAL ACCESS 2. CELIAC ARTERIOGRAM 3. RIGHT GASTRIC ARTERIOGRAM 4. RIGHT HEPATIC AND RADIOEMBOLIZATION OF THE RIGHT LOBE OF THE LIVER 5. FLUOROSCOPIC GUIDED ADMINISTRATION OF SIR-SPHERES COMPARISON:  Mapping Y 90 radioembolization-07/13/2019; PET-CT-06/17/2019; CT abdomen pelvis-05/24/2019; abdominal MRI - 06/03/2019 MEDICATIONS: Protonix 40 mg IV; Decadron 20 mg IV; Toradol 30 mg IV; Zofran 4 mg IV CONTRAST:  80 cc OMNIPAQUE IOHEXOL 300 MG/ML  SOLN ANESTHESIA/SEDATION: Moderate (conscious) sedation was employed during this procedure. A total of Versed 3 mg and Fentanyl 200 mcg was administered intravenously. Moderate Sedation Time: 70 minutes. The patient's level of consciousness and vital signs were monitored continuously by radiology nursing throughout the procedure under my direct supervision. FLUOROSCOPY TIME:  15 minutes, 30 seconds (1,208 mGy) ACCESS: Right common femoral artery; hemostasis achieved with manual compression. COMPLICATIONS: None immediate. TECHNIQUE: Informed written consent was obtained from the patient after a discussion of the risks, benefits and alternatives to treatment. Questions regarding the procedure were encouraged and answered. A timeout was performed prior to the initiation of the procedure. The right groin was prepped and draped in the usual sterile fashion, and a sterile drape was applied covering the operative field. Maximum barrier sterile technique with sterile gowns and gloves were used for the procedure. A timeout was performed prior to the initiation of the procedure. Local anesthesia was provided with 1% lidocaine. The right femoral head was marked fluoroscopically. Under direct ultrasound guidance, the right common femoral artery was accessed with a micropuncture kit allowing placement of a of 5-French vascular sheath. An  ultrasound image was saved for documentation purposes. A limited arteriogram performed through the side arm of the sheath confirming appropriate access within the right common femoral artery. Over a Britta Mccreedy wire, a Mickelson catheter was advanced to the level of the inferior thoracic aorta where it was reformed, back bled and flushed. The Mickelson catheter was utilized to select the celiac artery and a celiac arteriogram was performed. Next, with the use of a fathom 14 microwire, a regular Renegade microcatheter was utilized to select the right gastric artery. Despite successful cannulation of the origin the right gastric artery and advancement of a microwire to its mid aspect, the microcatheter would not track beyond the origin the vessel secondary to tortuosity of the vessel's origin as well as it's small caliber. Next, the microcatheter was advanced to the level of the right hepatic artery and a selective right hepatic arteriogram was performed. Appropriate position was confirmed and the Radioembolization was then performed with Yttrium-90 SIR Spheres. Particles were administered via a microcatheter utilizing a completely enclosed system. Monitoring of antegrade flow was performed during and after the administration under fluoroscopy with use of contrast intermittently. After the administration of the particles, the microcatheter, outer catheter and administration system were then discarded into radiation safety receptacle. At this point, the procedure was terminated. The right  common femoral approach vascular sheath was removed and hemostasis was achieved with manual compression. A dressing was placed. The patient tolerated procedure well without immediate postprocedural complication. All participants involved with the procedure were surveyed by the radiation safety officer prior to exiting the fluoroscopy suite. The patient was escorted to the nuclear medicine department for post-treatment imaging. FINDINGS:  Selective celiac arteriogram demonstrates complete embolization of the gastroduodenal artery without residual flow. Selective right gastric arteriogram demonstrates patency and tortuosity/narrowing of the origin and proximal aspect. The origin of the right gastric artery was again noted to be adjacent to the dominant left hepatic artery. Despite catheterization of the origin of the vessel and advancement of a microwire to its mid aspect, the microcatheter could not be advanced beyond the origin to allow for safe percutaneous coil embolization. 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 The microcatheter was placed proximal to the origin of the accessory right hepatic artery and Y 90 radioembolization was performed from this location. Intermittent contrast injections during and after the radioembolization confirming preserved antegrade flow without reflux. IMPRESSION: 1. Technically successful radioembolization of the right lobe of the liver with Yttrium-90 microspheres. 2. Attempted though unsuccessful coil embolization of the right gastric artery secondary to tortuosity of the vessel's origin and small caliber of the vessel. PLAN: - Given inability to successfully percutaneously coil embolized the right gastric artery, Y 90 radioembolization of left lobe of the liver will NOT be pursued given proximity of the right gastric artery to the origin of the left hepatic artery and concern for nontarget embolization in this patient with progressive/advanced intra and extrahepatic metastatic disease. - The patient will be seen in follow-up consultation in the IR clinic in 3-4 weeks following acquisition of postprocedural CMP. - initial surveillance imaging will be performed in 3 months (likely with PET-CT). Electronically Signed   By: Sandi Mariscal M.D.   On: 07/27/2019 13:14   Nm Special Med Rad Physics  Cons  Result Date: 07/27/2019 CLINICAL DATA:  Unresectable liver metastasis. Bladder carcinoma primary. First treatment to the RIGHT hepatic lobe. EXAM: NUCLEAR MEDICINE SPECIAL MED RAD PHYSICS CONS; NUCLEAR MEDICINE RADIO PHARM THERAPY INTRA ARTERIAL; NUCLEAR MEDICINE TREATMENT PROCEDURE; NUCLEAR MEDICINE LIVER SCAN TECHNIQUE: In conjunction with the interventional radiologist a Y- Microsphere dose was calculated utilizing body surface area formulation. Calculated dose equal 28.2 mCi. Pre therapy MAA liver SPECT scan and CTA were evaluated. Utilizing a microcatheter system, the RIGHT hepatic artery was selected and Y-90 microspheres were delivered in fractionated aliquots. Radiopharmaceutical was delivered by the interventional radiologist and nuclear radiologist. The patient tolerated procedure well. No adverse effects were noted. Bremsstrahlung planar and SPECT imaging of the abdomen following intrahepatic arterial delivery of Y-90 microsphere was performed. RADIOPHARMACEUTICALS:  03.5 millicuries yttrium 90 microspheres. COMPARISON:  PET-CT 06/17/2019, MRI 06/03/2019 FINDINGS: Y - 90 microspheres therapy as above. First therapy the right hepatic lobe. Bremsstrahlung planar and SPECT imaging of the abdomen following intrahepatic arterial delivery of Y-50mcrosphere demonstrates radioactivity localized to the RIGHT hepatic lobe. No evidence of extrahepatic activity. IMPRESSION: Successful Y - 90 microsphere delivery for treatment of unresectable liver metastasis. First therapy to the RIGHT lobe. Bremssstrahlung scan demonstrates activity localized to RIGHT hepatic lobe with no extrahepatic activity identified. Electronically Signed   By: SSuzy BouchardM.D.   On: 07/27/2019 12:57   Nm Special Treatment Procedure  Result Date: 07/27/2019 CLINICAL DATA:  Unresectable liver metastasis. Bladder  carcinoma primary. First treatment to the RIGHT hepatic lobe. EXAM: NUCLEAR MEDICINE SPECIAL MED RAD PHYSICS CONS;  NUCLEAR MEDICINE RADIO PHARM THERAPY INTRA ARTERIAL; NUCLEAR MEDICINE TREATMENT PROCEDURE; NUCLEAR MEDICINE LIVER SCAN TECHNIQUE: In conjunction with the interventional radiologist a Y- Microsphere dose was calculated utilizing body surface area formulation. Calculated dose equal 28.2 mCi. Pre therapy MAA liver SPECT scan and CTA were evaluated. Utilizing a microcatheter system, the RIGHT hepatic artery was selected and Y-90 microspheres were delivered in fractionated aliquots. Radiopharmaceutical was delivered by the interventional radiologist and nuclear radiologist. The patient tolerated procedure well. No adverse effects were noted. Bremsstrahlung planar and SPECT imaging of the abdomen following intrahepatic arterial delivery of Y-90 microsphere was performed. RADIOPHARMACEUTICALS:  52.8 millicuries yttrium 90 microspheres. COMPARISON:  PET-CT 06/17/2019, MRI 06/03/2019 FINDINGS: Y - 90 microspheres therapy as above. First therapy the right hepatic lobe. Bremsstrahlung planar and SPECT imaging of the abdomen following intrahepatic arterial delivery of Y-45mcrosphere demonstrates radioactivity localized to the RIGHT hepatic lobe. No evidence of extrahepatic activity. IMPRESSION: Successful Y - 90 microsphere delivery for treatment of unresectable liver metastasis. First therapy to the RIGHT lobe. Bremssstrahlung scan demonstrates activity localized to RIGHT hepatic lobe with no extrahepatic activity identified. Electronically Signed   By: SSuzy BouchardM.D.   On: 07/27/2019 12:57   Ir [place] Vasc Access Right  Result Date: 07/27/2019 INDICATION: History of metastatic bladder cancer. Patient presents today for Y 90 radioembolization of the right lobe of the liver Note, pre Y 90 mapping radioembolization demonstrated a right gastric artery however this cannot be successfully coil embolized. As such will also attempt repeat coil embolization of the right gastric artery. EXAM: 1. ULTRASOUND GUIDANCE FOR  ARTERIAL ACCESS 2. CELIAC ARTERIOGRAM 3. RIGHT GASTRIC ARTERIOGRAM 4. RIGHT HEPATIC AND RADIOEMBOLIZATION OF THE RIGHT LOBE OF THE LIVER 5. FLUOROSCOPIC GUIDED ADMINISTRATION OF SIR-SPHERES COMPARISON:  Mapping Y 90 radioembolization-07/13/2019; PET-CT-06/17/2019; CT abdomen pelvis-05/24/2019; abdominal MRI - 06/03/2019 MEDICATIONS: Protonix 40 mg IV; Decadron 20 mg IV; Toradol 30 mg IV; Zofran 4 mg IV CONTRAST:  80 cc OMNIPAQUE IOHEXOL 300 MG/ML  SOLN ANESTHESIA/SEDATION: Moderate (conscious) sedation was employed during this procedure. A total of Versed 3 mg and Fentanyl 200 mcg was administered intravenously. Moderate Sedation Time: 70 minutes. The patient's level of consciousness and vital signs were monitored continuously by radiology nursing throughout the procedure under my direct supervision. FLUOROSCOPY TIME:  15 minutes, 30 seconds (1,208 mGy) ACCESS: Right common femoral artery; hemostasis achieved with manual compression. COMPLICATIONS: None immediate. TECHNIQUE: Informed written consent was obtained from the patient after a discussion of the risks, benefits and alternatives to treatment. Questions regarding the procedure were encouraged and answered. A timeout was performed prior to the initiation of the procedure. The right groin was prepped and draped in the usual sterile fashion, and a sterile drape was applied covering the operative field. Maximum barrier sterile technique with sterile gowns and gloves were used for the procedure. A timeout was performed prior to the initiation of the procedure. Local anesthesia was provided with 1% lidocaine. The right femoral head was marked fluoroscopically. Under direct ultrasound guidance, the right common femoral artery was accessed with a micropuncture kit allowing placement of a of 5-French vascular sheath. An ultrasound image was saved for documentation purposes. A limited arteriogram performed through the side arm of the sheath confirming appropriate  access within the right common femoral artery. Over a BBritta Mccreedywire, a Mickelson catheter was advanced to the level of the inferior thoracic aorta where it  was reformed, back bled and flushed. The Mickelson catheter was utilized to select the celiac artery and a celiac arteriogram was performed. Next, with the use of a fathom 14 microwire, a regular Renegade microcatheter was utilized to select the right gastric artery. Despite successful cannulation of the origin the right gastric artery and advancement of a microwire to its mid aspect, the microcatheter would not track beyond the origin the vessel secondary to tortuosity of the vessel's origin as well as it's small caliber. Next, the microcatheter was advanced to the level of the right hepatic artery and a selective right hepatic arteriogram was performed. Appropriate position was confirmed and the Radioembolization was then performed with Yttrium-90 SIR Spheres. Particles were administered via a microcatheter utilizing a completely enclosed system. Monitoring of antegrade flow was performed during and after the administration under fluoroscopy with use of contrast intermittently. After the administration of the particles, the microcatheter, outer catheter and administration system were then discarded into radiation safety receptacle. At this point, the procedure was terminated. The right common femoral approach vascular sheath was removed and hemostasis was achieved with manual compression. A dressing was placed. The patient tolerated procedure well without immediate postprocedural complication. All participants involved with the procedure were surveyed by the radiation safety officer prior to exiting the fluoroscopy suite. The patient was escorted to the nuclear medicine department for post-treatment imaging. FINDINGS: Selective celiac arteriogram demonstrates complete embolization of the gastroduodenal artery without residual flow. Selective right gastric  arteriogram demonstrates patency and tortuosity/narrowing of the origin and proximal aspect. The origin of the right gastric artery was again noted to be adjacent to the dominant left hepatic artery. Despite catheterization of the origin of the vessel and advancement of a microwire to its mid aspect, the microcatheter could not be advanced beyond the origin to allow for safe percutaneous coil embolization. 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 The microcatheter was placed proximal to the origin of the accessory right hepatic artery and Y 90 radioembolization was performed from this location. Intermittent contrast injections during and after the radioembolization confirming preserved antegrade flow without reflux. IMPRESSION: 1. Technically successful radioembolization of the right lobe of the liver with Yttrium-90 microspheres. 2. Attempted though unsuccessful coil embolization of the right gastric artery secondary to tortuosity of the vessel's origin and small caliber of the vessel. PLAN: - Given inability to successfully percutaneously coil embolized the right gastric artery, Y 90 radioembolization of left lobe of the liver will NOT be pursued given proximity of the right gastric artery to the origin of the left hepatic artery and concern for nontarget embolization in this patient with progressive/advanced intra and extrahepatic metastatic disease. - The patient will be seen in follow-up consultation in the IR clinic in 3-4 weeks following acquisition of postprocedural CMP. - initial surveillance imaging will be performed in 3 months (likely with PET-CT). Electronically Signed   By: Sandi Mariscal M.D.   On: 07/27/2019 13:14   Dg Abd 2 Views  Result Date: 08/16/2019 CLINICAL DATA:  Worsening abdominal pain and constipation. History of right nephrectomy for renal cell carcinoma. Recent Y 90  procedure. EXAM: ABDOMEN - 2 VIEW COMPARISON:  PET-CT 06/17/2019 FINDINGS: The lung bases are clear. No worrisome pulmonary lesions at the lung bases and no pleural effusions. There is some scattered stool in the colon but no findings for constipation. Paucity of small bowel gas. No findings for obstruction  or perforation. The soft tissue shadows are grossly maintained. No worrisome calcifications. Arterial coils are noted in the central abdomen. Penile prosthesis is noted. The bony structures are unremarkable. No worrisome bone lesions. IMPRESSION: No plain film findings for an acute abdominal process. No findings for constipation or obstruction. Electronically Signed   By: Marijo Sanes M.D.   On: 08/16/2019 12:43   Ir Embo Tumor Organ Ischemia Infarct Inc Guide Roadmapping  Result Date: 07/27/2019 INDICATION: History of metastatic bladder cancer. Patient presents today for Y 90 radioembolization of the right lobe of the liver Note, pre Y 90 mapping radioembolization demonstrated a right gastric artery however this cannot be successfully coil embolized. As such will also attempt repeat coil embolization of the right gastric artery. EXAM: 1. ULTRASOUND GUIDANCE FOR ARTERIAL ACCESS 2. CELIAC ARTERIOGRAM 3. RIGHT GASTRIC ARTERIOGRAM 4. RIGHT HEPATIC AND RADIOEMBOLIZATION OF THE RIGHT LOBE OF THE LIVER 5. FLUOROSCOPIC GUIDED ADMINISTRATION OF SIR-SPHERES COMPARISON:  Mapping Y 90 radioembolization-07/13/2019; PET-CT-06/17/2019; CT abdomen pelvis-05/24/2019; abdominal MRI - 06/03/2019 MEDICATIONS: Protonix 40 mg IV; Decadron 20 mg IV; Toradol 30 mg IV; Zofran 4 mg IV CONTRAST:  80 cc OMNIPAQUE IOHEXOL 300 MG/ML  SOLN ANESTHESIA/SEDATION: Moderate (conscious) sedation was employed during this procedure. A total of Versed 3 mg and Fentanyl 200 mcg was administered intravenously. Moderate Sedation Time: 70 minutes. The patient's level of consciousness and vital signs were monitored continuously by radiology nursing  throughout the procedure under my direct supervision. FLUOROSCOPY TIME:  15 minutes, 30 seconds (1,208 mGy) ACCESS: Right common femoral artery; hemostasis achieved with manual compression. COMPLICATIONS: None immediate. TECHNIQUE: Informed written consent was obtained from the patient after a discussion of the risks, benefits and alternatives to treatment. Questions regarding the procedure were encouraged and answered. A timeout was performed prior to the initiation of the procedure. The right groin was prepped and draped in the usual sterile fashion, and a sterile drape was applied covering the operative field. Maximum barrier sterile technique with sterile gowns and gloves were used for the procedure. A timeout was performed prior to the initiation of the procedure. Local anesthesia was provided with 1% lidocaine. The right femoral head was marked fluoroscopically. Under direct ultrasound guidance, the right common femoral artery was accessed with a micropuncture kit allowing placement of a of 5-French vascular sheath. An ultrasound image was saved for documentation purposes. A limited arteriogram performed through the side arm of the sheath confirming appropriate access within the right common femoral artery. Over a Britta Mccreedy wire, a Mickelson catheter was advanced to the level of the inferior thoracic aorta where it was reformed, back bled and flushed. The Mickelson catheter was utilized to select the celiac artery and a celiac arteriogram was performed. Next, with the use of a fathom 14 microwire, a regular Renegade microcatheter was utilized to select the right gastric artery. Despite successful cannulation of the origin the right gastric artery and advancement of a microwire to its mid aspect, the microcatheter would not track beyond the origin the vessel secondary to tortuosity of the vessel's origin as well as it's small caliber. Next, the microcatheter was advanced to the level of the right hepatic artery  and a selective right hepatic arteriogram was performed. Appropriate position was confirmed and the Radioembolization was then performed with Yttrium-90 SIR Spheres. Particles were administered via a microcatheter utilizing a completely enclosed system. Monitoring of antegrade flow was performed during and after the administration under fluoroscopy with use of contrast intermittently. After the administration of the particles, the  microcatheter, outer catheter and administration system were then discarded into radiation safety receptacle. At this point, the procedure was terminated. The right common femoral approach vascular sheath was removed and hemostasis was achieved with manual compression. A dressing was placed. The patient tolerated procedure well without immediate postprocedural complication. All participants involved with the procedure were surveyed by the radiation safety officer prior to exiting the fluoroscopy suite. The patient was escorted to the nuclear medicine department for post-treatment imaging. FINDINGS: Selective celiac arteriogram demonstrates complete embolization of the gastroduodenal artery without residual flow. Selective right gastric arteriogram demonstrates patency and tortuosity/narrowing of the origin and proximal aspect. The origin of the right gastric artery was again noted to be adjacent to the dominant left hepatic artery. Despite catheterization of the origin of the vessel and advancement of a microwire to its mid aspect, the microcatheter could not be advanced beyond the origin to allow for safe percutaneous coil embolization. 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 The microcatheter was placed proximal to the origin of the accessory right hepatic artery and Y 90 radioembolization was performed from this location. Intermittent contrast injections during and  after the radioembolization confirming preserved antegrade flow without reflux. IMPRESSION: 1. Technically successful radioembolization of the right lobe of the liver with Yttrium-90 microspheres. 2. Attempted though unsuccessful coil embolization of the right gastric artery secondary to tortuosity of the vessel's origin and small caliber of the vessel. PLAN: - Given inability to successfully percutaneously coil embolized the right gastric artery, Y 90 radioembolization of left lobe of the liver will NOT be pursued given proximity of the right gastric artery to the origin of the left hepatic artery and concern for nontarget embolization in this patient with progressive/advanced intra and extrahepatic metastatic disease. - The patient will be seen in follow-up consultation in the IR clinic in 3-4 weeks following acquisition of postprocedural CMP. - initial surveillance imaging will be performed in 3 months (likely with PET-CT). Electronically Signed   By: Sandi Mariscal M.D.   On: 07/27/2019 13:14   Nm Radio Pharm Therapy Intraarterial  Result Date: 07/27/2019 CLINICAL DATA:  Unresectable liver metastasis. Bladder carcinoma primary. First treatment to the RIGHT hepatic lobe. EXAM: NUCLEAR MEDICINE SPECIAL MED RAD PHYSICS CONS; NUCLEAR MEDICINE RADIO PHARM THERAPY INTRA ARTERIAL; NUCLEAR MEDICINE TREATMENT PROCEDURE; NUCLEAR MEDICINE LIVER SCAN TECHNIQUE: In conjunction with the interventional radiologist a Y- Microsphere dose was calculated utilizing body surface area formulation. Calculated dose equal 28.2 mCi. Pre therapy MAA liver SPECT scan and CTA were evaluated. Utilizing a microcatheter system, the RIGHT hepatic artery was selected and Y-90 microspheres were delivered in fractionated aliquots. Radiopharmaceutical was delivered by the interventional radiologist and nuclear radiologist. The patient tolerated procedure well. No adverse effects were noted. Bremsstrahlung planar and SPECT imaging of the abdomen  following intrahepatic arterial delivery of Y-90 microsphere was performed. RADIOPHARMACEUTICALS:  16.1 millicuries yttrium 90 microspheres. COMPARISON:  PET-CT 06/17/2019, MRI 06/03/2019 FINDINGS: Y - 90 microspheres therapy as above. First therapy the right hepatic lobe. Bremsstrahlung planar and SPECT imaging of the abdomen following intrahepatic arterial delivery of Y-2mcrosphere demonstrates radioactivity localized to the RIGHT hepatic lobe. No evidence of extrahepatic activity. IMPRESSION: Successful Y - 90 microsphere delivery for treatment of unresectable liver metastasis. First therapy to the RIGHT lobe. Bremssstrahlung scan demonstrates activity localized to RIGHT hepatic lobe with no extrahepatic activity identified. Electronically Signed   By: SHelane GuntherD.  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  PDL 1 0%  PLAN:    1.  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  Repeat CT scan from August 20, 2019 reviewed independently and reported as above with continued progression of disease.  Beryle Flock has been discontinued and we discussed the possibility of enrolling in hospice.  Patient expressed understanding that his treatment options are limited, but is not ready to make this decision just yet.  Will have video visit in 1 week to further discuss options.   2.  Genetic testing: Negative. 3.  Kidney function: Patient's creatinine continues to be within normal limits. 4.  Cardiac disease: Continue follow-up with cardiology as indicated. 5.  Pain:  Patient is now completed XRT.  Continue fentanyl patch 12 mcg every 72 hours and oxycodone as needed.   6.  Constipation: Continue current bowel regimen and have recommended adding MiraLAX or magnesium citrate OTC. 7.  Bony  metastasis: Patient last received Zometa on May 06, 2019. 8.  Abdominal pain: Patient recently underwent Y 90 ablation, but now has progressive disease.  His current pain/bloating is likely secondary to progressive ascites and will get a therapeutic paracentesis later today.  Return to clinic as above. 9.  Leukocytosis: Likely reactive, monitor. 10.  Anemia: Chronic and unchanged.  Patient's hemoglobin is 11.0. 11.  Liver dysfunction: Patient's AST, ALT, and bilirubin are all trending upward likely secondary to progressive disease.  Consider hospice as above. 12.  Nausea: Patient does not complain of this today.  Patient has been instructed to take his medication scheduled rather than as needed. 13.  Poor appetite: Continue Megace daily.  Patient previously refused dietary consultation. 14.  Hyperkalemia: Patient declined IV fluids today.  Monitor.  Patient expressed understanding and was in agreement with this plan. He also understands that He can call clinic at any time with any questions, concerns, or complaints.   Cancer Staging Urothelial carcinoma of bladder [facility]) Staging form: Urinary Bladder, AJCC 8th Edition - Clinical: Stage IVA Laurier Nancy, cN2, cM1a) - Signed by [name], MD on 04/09/2018   [name], MD   08/23/2019 12:01 PM

## 2019-08-23 ENCOUNTER — Inpatient Hospital Stay (HOSPITAL_BASED_OUTPATIENT_CLINIC_OR_DEPARTMENT_OTHER): Payer: Medicare Other | Admitting: Oncology

## 2019-08-23 ENCOUNTER — Inpatient Hospital Stay: Payer: Medicare Other

## 2019-08-23 ENCOUNTER — Other Ambulatory Visit: Payer: Self-pay

## 2019-08-23 ENCOUNTER — Ambulatory Visit
Admission: RE | Admit: 2019-08-23 | Discharge: 2019-08-23 | Disposition: A | Discharge status: Home / Self Care | Payer: Medicare Other | Source: Ambulatory Visit | Attending: Oncology | Admitting: Oncology

## 2019-08-23 ENCOUNTER — Inpatient Hospital Stay (HOSPITAL_BASED_OUTPATIENT_CLINIC_OR_DEPARTMENT_OTHER): Payer: Medicare Other | Admitting: Hospice and Palliative Medicine

## 2019-08-23 VITALS — BP 95/67 | HR 103 | Temp 95.7°F | Resp 18 | Wt 167.0 lb

## 2019-08-23 DIAGNOSIS — I2510 Atherosclerotic heart disease of native coronary artery without angina pectoris: Secondary | ICD-10-CM

## 2019-08-23 DIAGNOSIS — C679 Malignant neoplasm of bladder, unspecified: Secondary | ICD-10-CM | POA: Insufficient documentation

## 2019-08-23 DIAGNOSIS — Z515 Encounter for palliative care: Secondary | ICD-10-CM | POA: Diagnosis not present

## 2019-08-23 LAB — CBC WITH DIFFERENTIAL/PLATELET
Abs Immature Granulocytes: 0.08 10*3/uL — ABNORMAL HIGH (ref 0.00–0.07)
Basophils Absolute: 0 10*3/uL (ref 0.0–0.1)
Basophils Relative: 0 %
Eosinophils Absolute: 0 10*3/uL (ref 0.0–0.5)
Eosinophils Relative: 0 %
HCT: 35.5 % — ABNORMAL LOW (ref 39.0–52.0)
Hemoglobin: 11 g/dL — ABNORMAL LOW (ref 13.0–17.0)
Immature Granulocytes: 1 %
Lymphocytes Relative: 6 %
Lymphs Abs: 0.7 10*3/uL (ref 0.7–4.0)
MCH: 29.5 pg (ref 26.0–34.0)
MCHC: 31 g/dL (ref 30.0–36.0)
MCV: 95.2 fL (ref 80.0–100.0)
Monocytes Absolute: 1.1 10*3/uL — ABNORMAL HIGH (ref 0.1–1.0)
Monocytes Relative: 9 %
Neutro Abs: 10.3 10*3/uL — ABNORMAL HIGH (ref 1.7–7.7)
Neutrophils Relative %: 84 %
Platelets: 107 10*3/uL — ABNORMAL LOW (ref 150–400)
RBC: 3.73 MIL/uL — ABNORMAL LOW (ref 4.22–5.81)
RDW: 20.6 % — ABNORMAL HIGH (ref 11.5–15.5)
WBC: 12.3 10*3/uL — ABNORMAL HIGH (ref 4.0–10.5)
nRBC: 0 % (ref 0.0–0.2)

## 2019-08-23 LAB — COMPREHENSIVE METABOLIC PANEL
ALT: 89 U/L — ABNORMAL HIGH (ref 0–44)
AST: 173 U/L — ABNORMAL HIGH (ref 15–41)
Albumin: 2.4 g/dL — ABNORMAL LOW (ref 3.5–5.0)
Alkaline Phosphatase: 409 U/L — ABNORMAL HIGH (ref 38–126)
Anion gap: 12 (ref 5–15)
BUN: 29 mg/dL — ABNORMAL HIGH (ref 8–23)
CO2: 16 mmol/L — ABNORMAL LOW (ref 22–32)
Calcium: 9.6 mg/dL (ref 8.9–10.3)
Chloride: 100 mmol/L (ref 98–111)
Creatinine, Ser: 0.83 mg/dL (ref 0.61–1.24)
GFR calc Af Amer: >60 mL/min (ref >60)
GFR calc non Af Amer: >60 mL/min (ref >60)
Glucose, Bld: 97 mg/dL (ref 70–99)
Potassium: 5.6 mmol/L — ABNORMAL HIGH (ref 3.5–5.1)
Sodium: 128 mmol/L — ABNORMAL LOW (ref 135–145)
Total Bilirubin: 1.7 mg/dL — ABNORMAL HIGH (ref 0.3–1.2)
Total Protein: 5.7 g/dL — ABNORMAL LOW (ref 6.5–8.1)

## 2019-08-23 MED ORDER — HEPARIN SOD (PORK) LOCK FLUSH 100 UNIT/ML IV SOLN
500.0000 [IU] | Freq: Once | INTRAVENOUS | Status: AC
  Administered 2019-08-23: 500 [IU] via INTRAVENOUS

## 2019-08-23 MED ORDER — SODIUM CHLORIDE 0.9% FLUSH
10.0000 mL | Freq: Once | INTRAVENOUS | Status: AC
  Administered 2019-08-23: 10 mL via INTRAVENOUS
  Filled 2019-08-23: qty 10

## 2019-08-23 NOTE — Procedures (Signed)
PROCEDURE SUMMARY:  Successful US guided paracentesis from LLQ.  Yielded 1 L of clear yellow fluid.  No immediate complications.  Pt tolerated well.   Specimen was not sent for labs.  EBL < 80mL  Ascencion Dike PA-C 08/23/2019 3:16 PM

## 2019-08-23 NOTE — Progress Notes (Signed)
Patient mentions he is not able to eat or drink much. Please call daughter during visit

## 2019-08-23 NOTE — Progress Notes (Signed)
[name]  [phone] [phone]   Name: David Rich Date: 08/23/2019 MRN: [id number]  DOB: Feb 16, 1946  Patient Care Team: Baxter Hire, MD as PCP - General (Internal Medicine) [name], MD as PCP - Cardiology (Cardiology) [name], MD as Consulting Physician (Oncology)    REASON FOR CONSULTATION: Palliative Care consult requested for this 73 y.o. 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  Patient has had difficulty with nausea and pain.  CT scan of chest abdomen and pelvis on 08/20/2019 reveals interval progression of liver metastases, progression of bone metastases, mixed response of pulmonary nodules, and new enlarged subcarinal lymph nodes, and abdominal ascites.  Hospice has been discussed.  He was referred to palliative care to help address goals and manage ongoing symptoms.  SOCIAL HISTORY:     reports that he quit smoking about 7 years ago. His smoking use included cigarettes. He has a 55.00 pack-year smoking history. He has never used smokeless tobacco. He reports current alcohol use. He reports that he does not use drugs.  Patient is divorced.  He lives at home alone.  He has a son and daughter, both of whom live in [place].  Patient formally worked in a Civil engineer, contracting.  ADVANCE DIRECTIVES:  Does not have  CODE STATUS:   PAST MEDICAL HISTORY: Past Medical History:  Diagnosis Date   Cancer ([place]) 2013   Right Renal    Hyperlipidemia    Hypertension    Renal disorder    Urothelial carcinoma of bladder ([place]) 2020    PAST SURGICAL HISTORY:  Past Surgical History:  Procedure Laterality Date   CORONARY STENT INTERVENTION N/A 09/28/2018   Procedure: CORONARY STENT INTERVENTION;  Surgeon: [name], MD;  Location: [facility];  Service: Cardiovascular;  Laterality: N/A;   CORONARY STENT PLACEMENT     IR ANGIOGRAM SELECTIVE EACH ADDITIONAL VESSEL  07/13/2019   IR ANGIOGRAM SELECTIVE EACH ADDITIONAL VESSEL  07/13/2019   IR ANGIOGRAM SELECTIVE EACH ADDITIONAL VESSEL  07/13/2019   IR ANGIOGRAM SELECTIVE EACH ADDITIONAL VESSEL  07/13/2019   IR ANGIOGRAM SELECTIVE EACH ADDITIONAL VESSEL  07/13/2019   IR ANGIOGRAM SELECTIVE EACH ADDITIONAL VESSEL  07/13/2019   IR ANGIOGRAM SELECTIVE EACH ADDITIONAL VESSEL  07/27/2019   IR ANGIOGRAM SELECTIVE EACH ADDITIONAL VESSEL  07/27/2019   IR ANGIOGRAM VISCERAL SELECTIVE  07/13/2019   IR ANGIOGRAM VISCERAL SELECTIVE  07/27/2019   IR EMBO ARTERIAL NOT HEMORR HEMANG INC GUIDE ROADMAPPING  07/13/2019   IR EMBO TUMOR ORGAN ISCHEMIA INFARCT INC GUIDE ROADMAPPING  07/27/2019   IR FLUORO GUIDE CV LINE RIGHT  04/03/2018   IR RADIOLOGIST EVAL & MGMT  06/01/2019   IR RADIOLOGIST EVAL & MGMT  06/09/2019   IR US GUIDE VASC ACCESS RIGHT  07/13/2019   IR US GUIDE VASC ACCESS RIGHT  07/27/2019   kidney removed Left 2013   LEFT HEART CATH AND CORONARY ANGIOGRAPHY N/A 09/28/2018   Procedure: LEFT HEART CATH AND CORONARY ANGIOGRAPHY;  Surgeon: [name], MD;  Location: [facility];  Service: Cardiovascular;  Laterality: N/A;    HEMATOLOGY/ONCOLOGY HISTORY:  Oncology History  Urothelial carcinoma of bladder ([place])  03/23/2018 Initial Diagnosis   Urothelial carcinoma of bladder ([place])   03/26/2018 Cancer Staging   Staging form: Urinary Bladder, AJCC 8th Edition - Clinical: Stage IVA (cT4a, cN2, cM1a) -  Signed by [name], MD on 04/09/2018   04/08/2018 - 05/19/2019 Chemotherapy   The patient had palonosetron (ALOXI) injection 0.25 mg, 0.25 mg, Intravenous,  Once, 5 of 7 cycles Administration: 0.25 mg (04/08/2018), 0.25 mg (04/15/2018), 0.25 mg (04/29/2018), 0.25 mg (05/06/2018), 0.25 mg (05/20/2018), 0.25 mg (05/27/2018), 0.25 mg (03/25/2019), 0.25 mg  (04/08/2019), 0.25 mg (04/29/2019), 0.25 mg (05/06/2019) CISplatin (PLATINOL) 74 mg in sodium chloride 0.9 % 250 mL chemo infusion, 35 mg/m2 = 74 mg (100 % of original dose 35 mg/m2), Intravenous,  Once, 5 of 7 cycles Dose modification: 35 mg/m2 (original dose 35 mg/m2, Cycle 1, Reason: Other (see comments)) Administration: 74 mg (04/08/2018), 74 mg (04/15/2018), 74 mg (04/29/2018), 74 mg (05/06/2018), 74 mg (05/20/2018), 74 mg (05/27/2018), 74 mg (03/25/2019), 74 mg (04/08/2019), 74 mg (04/29/2019), 74 mg (05/06/2019) gemcitabine (GEMZAR) 2,200 mg in sodium chloride 0.9 % 250 mL chemo infusion, 2,128 mg, Intravenous,  Once, 5 of 7 cycles Dose modification: 800 mg/m2 (original dose 1,000 mg/m2, Cycle 3, Reason: Dose not tolerated) Administration: 2,200 mg (04/08/2018), 2,200 mg (04/15/2018), 2,200 mg (04/29/2018), 2,000 mg (05/06/2018), 1,600 mg (05/20/2018), 1,600 mg (05/27/2018), 1,600 mg (03/25/2019), 1,600 mg (04/08/2019), 1,600 mg (04/29/2019), 1,600 mg (05/06/2019) fosaprepitant (EMEND) 150 mg, dexamethasone (DECADRON) 12 mg in sodium chloride 0.9 % 145 mL IVPB, , Intravenous,  Once, 5 of 7 cycles Administration:  (04/08/2018),  (04/15/2018),  (04/29/2018),  (05/06/2018),  (05/20/2018),  (05/27/2018),  (03/25/2019),  (04/08/2019),  (04/29/2019),  (05/06/2019)  for chemotherapy treatment.    06/03/2019 -  Chemotherapy   The patient had pembrolizumab (KEYTRUDA) 200 mg in sodium chloride 0.9 % 50 mL chemo infusion, 200 mg, Intravenous, Once, 4 of 6 cycles Administration: 200 mg (06/03/2019), 200 mg (06/22/2019), 200 mg (07/12/2019), 200 mg (08/02/2019)  for chemotherapy treatment.      ALLERGIES:  is allergic to clopidogrel; rosuvastatin calcium; etodolac; hydrocodone; other; statins; meloxicam; and pravastatin sodium.  MEDICATIONS:  Current Outpatient Medications  Medication Sig Dispense Refill   ALPRAZolam (XANAX) 0.25 MG tablet      amLODipine (NORVASC) 10 MG tablet TAKE 1 TABLET BY MOUTH EVERY DAY     aspirin EC 81 MG tablet  Take by mouth.     carvedilol (COREG) 6.25 MG tablet Take 1 tablet (6.25 mg total) by mouth 2 (two) times daily. 60 tablet 6   clopidogrel (PLAVIX) 75 MG tablet Take 75 mg by mouth daily.     docusate sodium (COLACE) 100 MG capsule Take 100 mg by mouth 2 (two) times daily as needed for mild constipation. 2 in am, 3 in pm     ezetimibe (ZETIA) 10 MG tablet TAKE 1 TABLET BY MOUTH DAILY 90 tablet 0   famotidine (PEPCID) 20 MG tablet Take 20 mg by mouth 2 (two) times daily.      fentaNYL (DURAGESIC) 25 MCG/HR Place 1 patch onto the skin every 3 (three) days.     Garlic 553 MG TABS Take by mouth.      HYDROcodone-acetaminophen (NORCO/VICODIN) 5-325 MG tablet Take 1 tablet by mouth every 6 (six) hours as needed for moderate pain. 120 tablet 0   magnesium hydroxide (MILK OF MAGNESIA) 400 MG/5ML suspension Take 30 mLs by mouth.     megestrol (MEGACE) 20 MG tablet Take 1 tablet (20 mg total) by mouth daily. 30 tablet 1   metoCLOPramide (REGLAN) 10 MG tablet Take 1 tablet (10 mg total) by mouth 4 (four) times daily. 30 tablet 1   Multiple Vitamin (MULTI-VITAMINS) TABS Take  by mouth.     nitroGLYCERIN (NITROSTAT) 0.4 MG SL tablet Place under the tongue.     Omega-3 Fatty Acids (FISH OIL OMEGA-3) 1000 MG CAPS Take by mouth daily.     ondansetron (ZOFRAN) 8 MG tablet Take 1 tablet (8 mg total) by mouth every 8 (eight) hours as needed for nausea or vomiting. 30 tablet 3   oxyCODONE (OXY IR/ROXICODONE) 5 MG immediate release tablet Take 0.5-1 tablets (2.5-5 mg total) by mouth every 6 (six) hours as needed for moderate pain, severe pain or breakthrough pain. 120 tablet 0   polyethylene glycol (MIRALAX / GLYCOLAX) 17 g packet Take 17 g by mouth 2 (two) times daily.      prochlorperazine (COMPAZINE) 10 MG tablet Take 1 tablet (10 mg total) by mouth every 6 (six) hours as needed for nausea or vomiting. 30 tablet 0   sucralfate (CARAFATE) 1 g tablet Take 1 tablet (1 g total) by mouth 3 (three)  times daily. 90 tablet 3   traZODone (DESYREL) 50 MG tablet Take 1-2 tablets (50-100 mg total) by mouth at bedtime. 30 tablet 0   No current facility-administered medications for this visit.    Facility-Administered Medications Ordered in Other Visits  Medication Dose Route Frequency Provider Last Rate Last Dose   sodium chloride flush (NS) 0.9 % injection 10 mL  10 mL Intravenous PRN [name], MD   10 mL at 10/05/18 1026    VITAL SIGNS: There were no vitals taken for this visit. There were no vitals filed for this visit.  Estimated body mass index is 22.65 kg/m as calculated from the following:   Height as of 07/13/19: 6' (1.829 m).   Weight as of an earlier encounter on 08/23/19: 167 lb (75.8 kg).  LABS: CBC:    Component Value Date/Time   WBC 12.3 (H) 08/23/2019 0858   HGB 11.0 (L) 08/23/2019 0858   HCT 35.5 (L) 08/23/2019 0858   PLT 107 (L) 08/23/2019 0858   MCV 95.2 08/23/2019 0858   NEUTROABS 10.3 (H) 08/23/2019 0858   LYMPHSABS 0.7 08/23/2019 0858   MONOABS 1.1 (H) 08/23/2019 0858   EOSABS 0.0 08/23/2019 0858   BASOSABS 0.0 08/23/2019 0858   Comprehensive Metabolic Panel:    Component Value Date/Time   NA 128 (L) 08/23/2019 0858   NA 139 03/23/2012 1029   K 5.6 (H) 08/23/2019 0858   K 4.1 03/23/2012 1029   CL 100 08/23/2019 0858   CL 108 (H) 03/23/2012 1029   CO2 16 (L) 08/23/2019 0858   CO2 25 03/23/2012 1029   BUN 29 (H) 08/23/2019 0858   BUN 16 03/23/2012 1029   CREATININE 0.83 08/23/2019 0858   CREATININE 1.27 03/23/2012 1029   GLUCOSE 97 08/23/2019 0858   GLUCOSE 83 03/23/2012 1029   CALCIUM 9.6 08/23/2019 0858   CALCIUM 8.8 03/23/2012 1029   AST 173 (H) 08/23/2019 0858   ALT 89 (H) 08/23/2019 0858   ALKPHOS 409 (H) 08/23/2019 0858   BILITOT 1.7 (H) 08/23/2019 0858   PROT 5.7 (L) 08/23/2019 0858   ALBUMIN 2.4 (L) 08/23/2019 0858    RADIOGRAPHIC STUDIES: Ct Chest W Contrast  Result Date: 08/20/2019 CLINICAL DATA:  Restaging  bladder cancer EXAM: CT CHEST, ABDOMEN, AND PELVIS WITH CONTRAST TECHNIQUE: Multidetector CT imaging of the chest, abdomen and pelvis was performed following the standard protocol during bolus administration of intravenous contrast. CONTRAST:  157m OMNIPAQUE IOHEXOL 300 MG/ML  SOLN COMPARISON:  PET-CT 06/17/2019 FINDINGS: CT CHEST FINDINGS Cardiovascular:  Normal heart size. No pericardial effusion identified. Aortic atherosclerosis. Three vessel coronary artery atherosclerosis. Mediastinum/Nodes: Normal appearance of the thyroid gland. The trachea appears patent and is midline. Mild circumferential wall thickening of the distal esophagus noted. Subcarinal lymph node measures 1.4 cm, image 31/2. This is compared with 0.7 cm previously. Right paratracheal lymph node has lost its normal fatty hilum and now measures 0.9 cm, image 22/2. Previously 0.8 cm. Left supraclavicular lymph node measures 1 cm, image 4/2. Previously this measured the same. Lungs/Pleura: No pleural effusion identified. No airspace consolidation. Multiple pulmonary nodules are identified within both lungs. A number of these are increased in size in the interval including: 6 mm lateral right upper lobe lung nodule identified, image 42/4. Previously 3 mm. Anteromedial right upper lobe lung nodule measures 0.8 cm, image 77/4. Previously 0.7 cm. Medial right lung base nodule measures 1.1 cm, image 119/4. New from previous exam. A number of nodules appear less solid than on the previous exam including a 1.5 cm right middle lobe lung nodule, image 12/4. Musculoskeletal: There is a lytic lesion involving the inferior right scapula measuring approximately 4.8 cm transverse, image 61/4. On the recent PET-CT this was FDG avid with normal bone mineralization. CT ABDOMEN PELVIS FINDINGS Hepatobiliary: Hepatomegaly. Multifocal liver metastases are identified. Confluent lesion spanning the medial and lateral segment of left lobe of liver measures 21.4 x 10.7 cm,  image 60/2. Previously this measured 6.7 x 9.1 cm. Segment 5 lesion measures 5.2 x 4.5 cm, image 78/2. Previously 3.3 x 3.2 cm. Lesion in segment 6 measures 4.9 x 3.8 cm, image 84/2. Previously 3.5 x 2.4 cm. Gallbladder unremarkable. No biliary dilatation. Pancreas: Unremarkable. No pancreatic ductal dilatation or surrounding inflammatory changes. Spleen: Normal in size without focal abnormality. Adrenals/Urinary Tract: Normal adrenal glands. Right nephrectomy. Unremarkable appearance of the left kidney. No left-sided hydronephrosis. Urinary bladder is unremarkable. Stomach/Bowel: Stomach is within normal limits. Appendix appears normal. No evidence of bowel wall thickening, distention, or inflammatory changes. Extensive distal colonic diverticulosis without acute inflammation. Vascular/Lymphatic: Aortic atherosclerosis. There is no aneurysm. Upper abdominal adenopathy is again noted. Index left periaortic lymph node measures 1.6 cm, image 76/2. Unchanged. Gastrohepatic ligament node measures 1.3 cm, image 66/2. New. Left common iliac node measures 1.4 cm, image 95/2. New from previous exam. Right external iliac node measures 1.4 cm, image 110/2. Previously this measured the same. Reproductive: Prostate is unremarkable.  Penile prosthesis. Other: There is a moderate volume of ascites identified within the abdomen and pelvis, new from previous exam. Musculoskeletal: Mixed lytic and sclerotic bone metastases involving the L2 vertebra has progressed in the interval. There is a new lucent lesion within the L2 vertebra which measures approximately 1.6 cm. Lytic lesion involving the superior endplate of L3 measures 1.9 cm. New from previous exam. IMPRESSION: 1. Significant interval progression of liver metastases. 2. Progression of bone metastases involving L2 vertebral body and right scapula. New lytic lesion involves the L3 vertebra. 3. Pulmonary nodules show a mixed response compared with previous exam. 4. New enlarged  subcarinal lymph node compatible with metastatic adenopathy. 5. Abdominal and pelvic ascites, new from previous exam. Electronically Signed   By: Kerby Moors M.D.   On: 08/20/2019 14:05   Nm Liver Tumor Loc Imflam Spect 1 Day  Result Date: 07/27/2019 CLINICAL DATA:  Unresectable liver metastasis. Bladder carcinoma primary. First treatment to the RIGHT hepatic lobe. EXAM: NUCLEAR MEDICINE SPECIAL MED RAD PHYSICS CONS; NUCLEAR MEDICINE RADIO PHARM THERAPY INTRA ARTERIAL; NUCLEAR MEDICINE TREATMENT PROCEDURE; NUCLEAR  MEDICINE LIVER SCAN TECHNIQUE: In conjunction with the interventional radiologist a Y- Microsphere dose was calculated utilizing body surface area formulation. Calculated dose equal 28.2 mCi. Pre therapy MAA liver SPECT scan and CTA were evaluated. Utilizing a microcatheter system, the RIGHT hepatic artery was selected and Y-90 microspheres were delivered in fractionated aliquots. Radiopharmaceutical was delivered by the interventional radiologist and nuclear radiologist. The patient tolerated procedure well. No adverse effects were noted. Bremsstrahlung planar and SPECT imaging of the abdomen following intrahepatic arterial delivery of Y-90 microsphere was performed. RADIOPHARMACEUTICALS:  62.9 millicuries yttrium 90 microspheres. COMPARISON:  PET-CT 06/17/2019, MRI 06/03/2019 FINDINGS: Y - 90 microspheres therapy as above. First therapy the right hepatic lobe. Bremsstrahlung planar and SPECT imaging of the abdomen following intrahepatic arterial delivery of Y-50mcrosphere demonstrates radioactivity localized to the RIGHT hepatic lobe. No evidence of extrahepatic activity. IMPRESSION: Successful Y - 90 microsphere delivery for treatment of unresectable liver metastasis. First therapy to the RIGHT lobe. Bremssstrahlung scan demonstrates activity localized to RIGHT hepatic lobe with no extrahepatic activity identified. Electronically Signed   By: SSuzy BouchardM.D.   On: 07/27/2019 12:57   Ct  Abdomen Pelvis W Contrast  Result Date: 08/20/2019 CLINICAL DATA:  Restaging bladder cancer EXAM: CT CHEST, ABDOMEN, AND PELVIS WITH CONTRAST TECHNIQUE: Multidetector CT imaging of the chest, abdomen and pelvis was performed following the standard protocol during bolus administration of intravenous contrast. CONTRAST:  1098mOMNIPAQUE IOHEXOL 300 MG/ML  SOLN COMPARISON:  PET-CT 06/17/2019 FINDINGS: CT CHEST FINDINGS Cardiovascular: Normal heart size. No pericardial effusion identified. Aortic atherosclerosis. Three vessel coronary artery atherosclerosis. Mediastinum/Nodes: Normal appearance of the thyroid gland. The trachea appears patent and is midline. Mild circumferential wall thickening of the distal esophagus noted. Subcarinal lymph node measures 1.4 cm, image 31/2. This is compared with 0.7 cm previously. Right paratracheal lymph node has lost its normal fatty hilum and now measures 0.9 cm, image 22/2. Previously 0.8 cm. Left supraclavicular lymph node measures 1 cm, image 4/2. Previously this measured the same. Lungs/Pleura: No pleural effusion identified. No airspace consolidation. Multiple pulmonary nodules are identified within both lungs. A number of these are increased in size in the interval including: 6 mm lateral right upper lobe lung nodule identified, image 42/4. Previously 3 mm. Anteromedial right upper lobe lung nodule measures 0.8 cm, image 77/4. Previously 0.7 cm. Medial right lung base nodule measures 1.1 cm, image 119/4. New from previous exam. A number of nodules appear less solid than on the previous exam including a 1.5 cm right middle lobe lung nodule, image 12/4. Musculoskeletal: There is a lytic lesion involving the inferior right scapula measuring approximately 4.8 cm transverse, image 61/4. On the recent PET-CT this was FDG avid with normal bone mineralization. CT ABDOMEN PELVIS FINDINGS Hepatobiliary: Hepatomegaly. Multifocal liver metastases are identified. Confluent lesion  spanning the medial and lateral segment of left lobe of liver measures 21.4 x 10.7 cm, image 60/2. Previously this measured 6.7 x 9.1 cm. Segment 5 lesion measures 5.2 x 4.5 cm, image 78/2. Previously 3.3 x 3.2 cm. Lesion in segment 6 measures 4.9 x 3.8 cm, image 84/2. Previously 3.5 x 2.4 cm. Gallbladder unremarkable. No biliary dilatation. Pancreas: Unremarkable. No pancreatic ductal dilatation or surrounding inflammatory changes. Spleen: Normal in size without focal abnormality. Adrenals/Urinary Tract: Normal adrenal glands. Right nephrectomy. Unremarkable appearance of the left kidney. No left-sided hydronephrosis. Urinary bladder is unremarkable. Stomach/Bowel: Stomach is within normal limits. Appendix appears normal. No evidence of bowel wall thickening, distention, or inflammatory changes. Extensive distal  colonic diverticulosis without acute inflammation. Vascular/Lymphatic: Aortic atherosclerosis. There is no aneurysm. Upper abdominal adenopathy is again noted. Index left periaortic lymph node measures 1.6 cm, image 76/2. Unchanged. Gastrohepatic ligament node measures 1.3 cm, image 66/2. New. Left common iliac node measures 1.4 cm, image 95/2. New from previous exam. Right external iliac node measures 1.4 cm, image 110/2. Previously this measured the same. Reproductive: Prostate is unremarkable.  Penile prosthesis. Other: There is a moderate volume of ascites identified within the abdomen and pelvis, new from previous exam. Musculoskeletal: Mixed lytic and sclerotic bone metastases involving the L2 vertebra has progressed in the interval. There is a new lucent lesion within the L2 vertebra which measures approximately 1.6 cm. Lytic lesion involving the superior endplate of L3 measures 1.9 cm. New from previous exam. IMPRESSION: 1. Significant interval progression of liver metastases. 2. Progression of bone metastases involving L2 vertebral body and right scapula. New lytic lesion involves the L3 vertebra.  3. Pulmonary nodules show a mixed response compared with previous exam. 4. New enlarged subcarinal lymph node compatible with metastatic adenopathy. 5. Abdominal and pelvic ascites, new from previous exam. Electronically Signed   By: Kerby Moors M.D.   On: 08/20/2019 14:05   Ir Angiogram Visceral Selective  Result Date: 07/27/2019 INDICATION: History of metastatic bladder cancer. Patient presents today for Y 90 radioembolization of the right lobe of the liver Note, pre Y 90 mapping radioembolization demonstrated a right gastric artery however this cannot be successfully coil embolized. As such will also attempt repeat coil embolization of the right gastric artery. EXAM: 1. ULTRASOUND GUIDANCE FOR ARTERIAL ACCESS 2. CELIAC ARTERIOGRAM 3. RIGHT GASTRIC ARTERIOGRAM 4. RIGHT HEPATIC AND RADIOEMBOLIZATION OF THE RIGHT LOBE OF THE LIVER 5. FLUOROSCOPIC GUIDED ADMINISTRATION OF SIR-SPHERES COMPARISON:  Mapping Y 90 radioembolization-07/13/2019; PET-CT-06/17/2019; CT abdomen pelvis-05/24/2019; abdominal MRI - 06/03/2019 MEDICATIONS: Protonix 40 mg IV; Decadron 20 mg IV; Toradol 30 mg IV; Zofran 4 mg IV CONTRAST:  80 cc OMNIPAQUE IOHEXOL 300 MG/ML  SOLN ANESTHESIA/SEDATION: Moderate (conscious) sedation was employed during this procedure. A total of Versed 3 mg and Fentanyl 200 mcg was administered intravenously. Moderate Sedation Time: 70 minutes. The patient's level of consciousness and vital signs were monitored continuously by radiology nursing throughout the procedure under my direct supervision. FLUOROSCOPY TIME:  15 minutes, 30 seconds (1,208 mGy) ACCESS: Right common femoral artery; hemostasis achieved with manual compression. COMPLICATIONS: None immediate. TECHNIQUE: Informed written consent was obtained from the patient after a discussion of the risks, benefits and alternatives to treatment. Questions regarding the procedure were encouraged and answered. A timeout was performed prior to the initiation of  the procedure. The right groin was prepped and draped in the usual sterile fashion, and a sterile drape was applied covering the operative field. Maximum barrier sterile technique with sterile gowns and gloves were used for the procedure. A timeout was performed prior to the initiation of the procedure. Local anesthesia was provided with 1% lidocaine. The right femoral head was marked fluoroscopically. Under direct ultrasound guidance, the right common femoral artery was accessed with a micropuncture kit allowing placement of a of 5-French vascular sheath. An ultrasound image was saved for documentation purposes. A limited arteriogram performed through the side arm of the sheath confirming appropriate access within the right common femoral artery. Over a Britta Mccreedy wire, a Mickelson catheter was advanced to the level of the inferior thoracic aorta where it was reformed, back bled and flushed. The Mickelson catheter was utilized to select the celiac artery and  a celiac arteriogram was performed. Next, with the use of a fathom 14 microwire, a regular Renegade microcatheter was utilized to select the right gastric artery. Despite successful cannulation of the origin the right gastric artery and advancement of a microwire to its mid aspect, the microcatheter would not track beyond the origin the vessel secondary to tortuosity of the vessel's origin as well as it's small caliber. Next, the microcatheter was advanced to the level of the right hepatic artery and a selective right hepatic arteriogram was performed. Appropriate position was confirmed and the Radioembolization was then performed with Yttrium-90 SIR Spheres. Particles were administered via a microcatheter utilizing a completely enclosed system. Monitoring of antegrade flow was performed during and after the administration under fluoroscopy with use of contrast intermittently. After the administration of the particles, the microcatheter, outer catheter and  administration system were then discarded into radiation safety receptacle. At this point, the procedure was terminated. The right common femoral approach vascular sheath was removed and hemostasis was achieved with manual compression. A dressing was placed. The patient tolerated procedure well without immediate postprocedural complication. All participants involved with the procedure were surveyed by the radiation safety officer prior to exiting the fluoroscopy suite. The patient was escorted to the nuclear medicine department for post-treatment imaging. FINDINGS: Selective celiac arteriogram demonstrates complete embolization of the gastroduodenal artery without residual flow. Selective right gastric arteriogram demonstrates patency and tortuosity/narrowing of the origin and proximal aspect. The origin of the right gastric artery was again noted to be adjacent to the dominant left hepatic artery. Despite catheterization of the origin of the vessel and advancement of a microwire to its mid aspect, the microcatheter could not be advanced beyond the origin to allow for safe percutaneous coil embolization. 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 The microcatheter was placed proximal to the origin of the accessory right hepatic artery and Y 90 radioembolization was performed from this location. Intermittent contrast injections during and after the radioembolization confirming preserved antegrade flow without reflux. IMPRESSION: 1. Technically successful radioembolization of the right lobe of the liver with Yttrium-90 microspheres. 2. Attempted though unsuccessful coil embolization of the right gastric artery secondary to tortuosity of the vessel's origin and small caliber of the vessel. PLAN: - Given inability to successfully percutaneously coil embolized the right gastric artery, Y 90  radioembolization of left lobe of the liver will NOT be pursued given proximity of the right gastric artery to the origin of the left hepatic artery and concern for nontarget embolization in this patient with progressive/advanced intra and extrahepatic metastatic disease. - The patient will be seen in follow-up consultation in the IR clinic in 3-4 weeks following acquisition of postprocedural CMP. - initial surveillance imaging will be performed in 3 months (likely with PET-CT). Electronically Signed   By: Sandi Mariscal M.D.   On: 07/27/2019 13:14   Ir Angiogram Selective Each Additional Vessel  Result Date: 07/27/2019 INDICATION: History of metastatic bladder cancer. Patient presents today for Y 90 radioembolization of the right lobe of the liver Note, pre Y 90 mapping radioembolization demonstrated a right gastric artery however this cannot be successfully coil embolized. As such will also attempt repeat coil embolization of the right gastric artery. EXAM: 1. ULTRASOUND GUIDANCE FOR ARTERIAL ACCESS 2. CELIAC ARTERIOGRAM 3. RIGHT GASTRIC ARTERIOGRAM 4. RIGHT HEPATIC AND RADIOEMBOLIZATION OF THE RIGHT LOBE OF THE LIVER 5. FLUOROSCOPIC GUIDED ADMINISTRATION OF SIR-SPHERES COMPARISON:  Mapping Y 90 radioembolization-07/13/2019; PET-CT-06/17/2019; CT abdomen pelvis-05/24/2019; abdominal MRI - 06/03/2019 MEDICATIONS: Protonix 40 mg IV; Decadron 20 mg IV; Toradol 30 mg IV; Zofran 4 mg IV CONTRAST:  80 cc OMNIPAQUE IOHEXOL 300 MG/ML  SOLN ANESTHESIA/SEDATION: Moderate (conscious) sedation was employed during this procedure. A total of Versed 3 mg and Fentanyl 200 mcg was administered intravenously. Moderate Sedation Time: 70 minutes. The patient's level of consciousness and vital signs were monitored continuously by radiology nursing throughout the procedure under my direct supervision. FLUOROSCOPY TIME:  15 minutes, 30 seconds (1,208 mGy) ACCESS: Right common femoral artery; hemostasis achieved with manual  compression. COMPLICATIONS: None immediate. TECHNIQUE: Informed written consent was obtained from the patient after a discussion of the risks, benefits and alternatives to treatment. Questions regarding the procedure were encouraged and answered. A timeout was performed prior to the initiation of the procedure. The right groin was prepped and draped in the usual sterile fashion, and a sterile drape was applied covering the operative field. Maximum barrier sterile technique with sterile gowns and gloves were used for the procedure. A timeout was performed prior to the initiation of the procedure. Local anesthesia was provided with 1% lidocaine. The right femoral head was marked fluoroscopically. Under direct ultrasound guidance, the right common femoral artery was accessed with a micropuncture kit allowing placement of a of 5-French vascular sheath. An ultrasound image was saved for documentation purposes. A limited arteriogram performed through the side arm of the sheath confirming appropriate access within the right common femoral artery. Over a Britta Mccreedy wire, a Mickelson catheter was advanced to the level of the inferior thoracic aorta where it was reformed, back bled and flushed. The Mickelson catheter was utilized to select the celiac artery and a celiac arteriogram was performed. Next, with the use of a fathom 14 microwire, a regular Renegade microcatheter was utilized to select the right gastric artery. Despite successful cannulation of the origin the right gastric artery and advancement of a microwire to its mid aspect, the microcatheter would not track beyond the origin the vessel secondary to tortuosity of the vessel's origin as well as it's small caliber. Next, the microcatheter was advanced to the level of the right hepatic artery and a selective right hepatic arteriogram was performed. Appropriate position was confirmed and the Radioembolization was then performed with Yttrium-90 SIR Spheres. Particles  were administered via a microcatheter utilizing a completely enclosed system. Monitoring of antegrade flow was performed during and after the administration under fluoroscopy with use of contrast intermittently. After the administration of the particles, the microcatheter, outer catheter and administration system were then discarded into radiation safety receptacle. At this point, the procedure was terminated. The right common femoral approach vascular sheath was removed and hemostasis was achieved with manual compression. A dressing was placed. The patient tolerated procedure well without immediate postprocedural complication. All participants involved with the procedure were surveyed by the radiation safety officer prior to exiting the fluoroscopy suite. The patient was escorted to the nuclear medicine department for post-treatment imaging. FINDINGS: Selective celiac arteriogram demonstrates complete embolization of the gastroduodenal artery without residual flow. Selective right gastric arteriogram demonstrates patency and tortuosity/narrowing of the origin and proximal aspect. The origin of the right gastric artery was again noted to be adjacent to the dominant left hepatic artery. Despite catheterization of the origin of the vessel and advancement of a microwire to its mid aspect, the microcatheter could not be advanced beyond the origin to allow for safe percutaneous coil embolization. Selective right hepatic  arteriogram demonstrates an accessory anterior division of the right hepatic artery arising proximal to the mid division of the vessel. The microcatheter was placed proximal to the origin of the accessory right hepatic artery and Y 90 radioembolization was performed from this location. Intermittent contrast injections during and after the radioembolization confirming preserved antegrade flow without reflux. IMPRESSION: 1. Technically successful radioembolization of the right lobe of the liver with  Yttrium-90 microspheres. 2. Attempted though unsuccessful coil embolization of the right gastric artery secondary to tortuosity of the vessel's origin and small caliber of the vessel. PLAN: - Given inability to successfully percutaneously coil embolized the right gastric artery, Y 90 radioembolization of left lobe of the liver will NOT be pursued given proximity of the right gastric artery to the origin of the left hepatic artery and concern for nontarget embolization in this patient with progressive/advanced intra and extrahepatic metastatic disease. - The patient will be seen in follow-up consultation in the IR clinic in 3-4 weeks following acquisition of postprocedural CMP. - initial surveillance imaging will be performed in 3 months (likely with PET-CT). Electronically Signed   By: Sandi Mariscal M.D.   On: 07/27/2019 13:14   Ir Angiogram Selective Each Additional Vessel  Result Date: 07/27/2019 INDICATION: History of metastatic bladder cancer. Patient presents today for Y 90 radioembolization of the right lobe of the liver Note, pre Y 90 mapping radioembolization demonstrated a right gastric artery however this cannot be successfully coil embolized. As such will also attempt repeat coil embolization of the right gastric artery. EXAM: 1. ULTRASOUND GUIDANCE FOR ARTERIAL ACCESS 2. CELIAC ARTERIOGRAM 3. RIGHT GASTRIC ARTERIOGRAM 4. RIGHT HEPATIC AND RADIOEMBOLIZATION OF THE RIGHT LOBE OF THE LIVER 5. FLUOROSCOPIC GUIDED ADMINISTRATION OF SIR-SPHERES COMPARISON:  Mapping Y 90 radioembolization-07/13/2019; PET-CT-06/17/2019; CT abdomen pelvis-05/24/2019; abdominal MRI - 06/03/2019 MEDICATIONS: Protonix 40 mg IV; Decadron 20 mg IV; Toradol 30 mg IV; Zofran 4 mg IV CONTRAST:  80 cc OMNIPAQUE IOHEXOL 300 MG/ML  SOLN ANESTHESIA/SEDATION: Moderate (conscious) sedation was employed during this procedure. A total of Versed 3 mg and Fentanyl 200 mcg was administered intravenously. Moderate Sedation Time: 70 minutes. The  patient's level of consciousness and vital signs were monitored continuously by radiology nursing throughout the procedure under my direct supervision. FLUOROSCOPY TIME:  15 minutes, 30 seconds (1,208 mGy) ACCESS: Right common femoral artery; hemostasis achieved with manual compression. COMPLICATIONS: None immediate. TECHNIQUE: Informed written consent was obtained from the patient after a discussion of the risks, benefits and alternatives to treatment. Questions regarding the procedure were encouraged and answered. A timeout was performed prior to the initiation of the procedure. The right groin was prepped and draped in the usual sterile fashion, and a sterile drape was applied covering the operative field. Maximum barrier sterile technique with sterile gowns and gloves were used for the procedure. A timeout was performed prior to the initiation of the procedure. Local anesthesia was provided with 1% lidocaine. The right femoral head was marked fluoroscopically. Under direct ultrasound guidance, the right common femoral artery was accessed with a micropuncture kit allowing placement of a of 5-French vascular sheath. An ultrasound image was saved for documentation purposes. A limited arteriogram performed through the side arm of the sheath confirming appropriate access within the right common femoral artery. Over a Britta Mccreedy wire, a Mickelson catheter was advanced to the level of the inferior thoracic aorta where it was reformed, back bled and flushed. The Mickelson catheter was utilized to select the celiac artery and a celiac arteriogram was performed.  Next, with the use of a fathom 14 microwire, a regular Renegade microcatheter was utilized to select the right gastric artery. Despite successful cannulation of the origin the right gastric artery and advancement of a microwire to its mid aspect, the microcatheter would not track beyond the origin the vessel secondary to tortuosity of the vessel's origin as well as  it's small caliber. Next, the microcatheter was advanced to the level of the right hepatic artery and a selective right hepatic arteriogram was performed. Appropriate position was confirmed and the Radioembolization was then performed with Yttrium-90 SIR Spheres. Particles were administered via a microcatheter utilizing a completely enclosed system. Monitoring of antegrade flow was performed during and after the administration under fluoroscopy with use of contrast intermittently. After the administration of the particles, the microcatheter, outer catheter and administration system were then discarded into radiation safety receptacle. At this point, the procedure was terminated. The right common femoral approach vascular sheath was removed and hemostasis was achieved with manual compression. A dressing was placed. The patient tolerated procedure well without immediate postprocedural complication. All participants involved with the procedure were surveyed by the radiation safety officer prior to exiting the fluoroscopy suite. The patient was escorted to the nuclear medicine department for post-treatment imaging. FINDINGS: Selective celiac arteriogram demonstrates complete embolization of the gastroduodenal artery without residual flow. Selective right gastric arteriogram demonstrates patency and tortuosity/narrowing of the origin and proximal aspect. The origin of the right gastric artery was again noted to be adjacent to the dominant left hepatic artery. Despite catheterization of the origin of the vessel and advancement of a microwire to its mid aspect, the microcatheter could not be advanced beyond the origin to allow for safe percutaneous coil embolization. 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 The microcatheter was placed proximal to the origin of the accessory right hepatic artery and Y 90  radioembolization was performed from this location. Intermittent contrast injections during and after the radioembolization confirming preserved antegrade flow without reflux. IMPRESSION: 1. Technically successful radioembolization of the right lobe of the liver with Yttrium-90 microspheres. 2. Attempted though unsuccessful coil embolization of the right gastric artery secondary to tortuosity of the vessel's origin and small caliber of the vessel. PLAN: - Given inability to successfully percutaneously coil embolized the right gastric artery, Y 90 radioembolization of left lobe of the liver will NOT be pursued given proximity of the right gastric artery to the origin of the left hepatic artery and concern for nontarget embolization in this patient with progressive/advanced intra and extrahepatic metastatic disease. - The patient will be seen in follow-up consultation in the IR clinic in 3-4 weeks following acquisition of postprocedural CMP. - initial surveillance imaging will be performed in 3 months (likely with PET-CT). Electronically Signed   By: Sandi Mariscal M.D.   On: 07/27/2019 13:14   Nm Special Med Rad Physics Cons  Result Date: 07/27/2019 CLINICAL DATA:  Unresectable liver metastasis. Bladder carcinoma primary. First treatment to the RIGHT hepatic lobe. EXAM: NUCLEAR MEDICINE SPECIAL MED RAD PHYSICS CONS; NUCLEAR MEDICINE RADIO PHARM THERAPY INTRA ARTERIAL; NUCLEAR MEDICINE TREATMENT PROCEDURE; NUCLEAR MEDICINE LIVER SCAN TECHNIQUE: In conjunction with the interventional radiologist a Y- Microsphere dose was calculated utilizing body surface area formulation. Calculated dose equal 28.2 mCi. Pre therapy MAA liver SPECT scan and CTA were evaluated. Utilizing a microcatheter system, the RIGHT hepatic artery was selected and Y-90 microspheres were delivered in fractionated  aliquots. Radiopharmaceutical was delivered by the interventional radiologist and nuclear radiologist. The patient tolerated procedure  well. No adverse effects were noted. Bremsstrahlung planar and SPECT imaging of the abdomen following intrahepatic arterial delivery of Y-90 microsphere was performed. RADIOPHARMACEUTICALS:  37.3 millicuries yttrium 90 microspheres. COMPARISON:  PET-CT 06/17/2019, MRI 06/03/2019 FINDINGS: Y - 90 microspheres therapy as above. First therapy the right hepatic lobe. Bremsstrahlung planar and SPECT imaging of the abdomen following intrahepatic arterial delivery of [id number] demonstrates radioactivity localized to the RIGHT hepatic lobe. No evidence of extrahepatic activity. IMPRESSION: Successful Y - 90 microsphere delivery for treatment of unresectable liver metastasis. First therapy to the RIGHT lobe. Bremssstrahlung scan demonstrates activity localized to RIGHT hepatic lobe with no extrahepatic activity identified. Electronically Signed   By: SSuzy BouchardM.D.   On: 07/27/2019 12:57   Nm Special Treatment Procedure  Result Date: 07/27/2019 CLINICAL DATA:  Unresectable liver metastasis. Bladder carcinoma primary. First treatment to the RIGHT hepatic lobe. EXAM: NUCLEAR MEDICINE SPECIAL MED RAD PHYSICS CONS; NUCLEAR MEDICINE RADIO PHARM THERAPY INTRA ARTERIAL; NUCLEAR MEDICINE TREATMENT PROCEDURE; NUCLEAR MEDICINE LIVER SCAN TECHNIQUE: In conjunction with the interventional radiologist a Y- Microsphere dose was calculated utilizing body surface area formulation. Calculated dose equal 28.2 mCi. Pre therapy MAA liver SPECT scan and CTA were evaluated. Utilizing a microcatheter system, the RIGHT hepatic artery was selected and Y-90 microspheres were delivered in fractionated aliquots. Radiopharmaceutical was delivered by the interventional radiologist and nuclear radiologist. The patient tolerated procedure well. No adverse effects were noted. Bremsstrahlung planar and SPECT imaging of the abdomen following intrahepatic arterial delivery of Y-90 microsphere was performed. RADIOPHARMACEUTICALS:  242.8 millicuries yttrium 90 microspheres. COMPARISON:  PET-CT 06/17/2019, MRI 06/03/2019 FINDINGS: Y - 90 microspheres therapy as above. First therapy the right hepatic lobe. Bremsstrahlung planar and SPECT imaging of the abdomen following intrahepatic arterial delivery of [id number] demonstrates radioactivity localized to the RIGHT hepatic lobe. No evidence of extrahepatic activity. IMPRESSION: Successful Y - 90 microsphere delivery for treatment of unresectable liver metastasis. First therapy to the RIGHT lobe. Bremssstrahlung scan demonstrates activity localized to RIGHT hepatic lobe with no extrahepatic activity identified. Electronically Signed   By: StSuzy Bouchard.D.   On: 07/27/2019 12:57   Ir UsKoreauide Vasc Access Right  Result Date: 07/27/2019 INDICATION: History of metastatic bladder cancer. Patient presents today for Y 90 radioembolization of the right lobe of the liver Note, pre Y 90 mapping radioembolization demonstrated a right gastric artery however this cannot be successfully coil embolized. As such will also attempt repeat coil embolization of the right gastric artery. EXAM: 1. ULTRASOUND GUIDANCE FOR ARTERIAL ACCESS 2. CELIAC ARTERIOGRAM 3. RIGHT GASTRIC ARTERIOGRAM 4. RIGHT HEPATIC AND RADIOEMBOLIZATION OF THE RIGHT LOBE OF THE LIVER 5. FLUOROSCOPIC GUIDED ADMINISTRATION OF SIR-SPHERES COMPARISON:  Mapping Y 90 radioembolization-07/13/2019; PET-CT-06/17/2019; CT abdomen pelvis-05/24/2019; abdominal MRI - 06/03/2019 MEDICATIONS: Protonix 40 mg IV; Decadron 20 mg IV; Toradol 30 mg IV; Zofran 4 mg IV CONTRAST:  80 cc OMNIPAQUE IOHEXOL 300 MG/ML  SOLN ANESTHESIA/SEDATION: Moderate (conscious) sedation was employed during this procedure. A total of Versed 3 mg and Fentanyl 200 mcg was administered intravenously. Moderate Sedation Time: 70 minutes. The patient's level of consciousness and vital signs were monitored continuously by radiology nursing throughout the procedure under my direct  supervision. FLUOROSCOPY TIME:  15 minutes, 30 seconds (1,208 mGy) ACCESS: Right common femoral artery; hemostasis achieved with manual compression. COMPLICATIONS: None immediate. TECHNIQUE: Informed written consent was obtained from the patient after a discussion of the  risks, benefits and alternatives to treatment. Questions regarding the procedure were encouraged and answered. A timeout was performed prior to the initiation of the procedure. The right groin was prepped and draped in the usual sterile fashion, and a sterile drape was applied covering the operative field. Maximum barrier sterile technique with sterile gowns and gloves were used for the procedure. A timeout was performed prior to the initiation of the procedure. Local anesthesia was provided with 1% lidocaine. The right femoral head was marked fluoroscopically. Under direct ultrasound guidance, the right common femoral artery was accessed with a micropuncture kit allowing placement of a of 5-French vascular sheath. An ultrasound image was saved for documentation purposes. A limited arteriogram performed through the side arm of the sheath confirming appropriate access within the right common femoral artery. Over a Britta Mccreedy wire, a Mickelson catheter was advanced to the level of the inferior thoracic aorta where it was reformed, back bled and flushed. The Mickelson catheter was utilized to select the celiac artery and a celiac arteriogram was performed. Next, with the use of a fathom 14 microwire, a regular Renegade microcatheter was utilized to select the right gastric artery. Despite successful cannulation of the origin the right gastric artery and advancement of a microwire to its mid aspect, the microcatheter would not track beyond the origin the vessel secondary to tortuosity of the vessel's origin as well as it's small caliber. Next, the microcatheter was advanced to the level of the right hepatic artery and a selective right hepatic arteriogram  was performed. Appropriate position was confirmed and the Radioembolization was then performed with Yttrium-90 SIR Spheres. Particles were administered via a microcatheter utilizing a completely enclosed system. Monitoring of antegrade flow was performed during and after the administration under fluoroscopy with use of contrast intermittently. After the administration of the particles, the microcatheter, outer catheter and administration system were then discarded into radiation safety receptacle. At this point, the procedure was terminated. The right common femoral approach vascular sheath was removed and hemostasis was achieved with manual compression. A dressing was placed. The patient tolerated procedure well without immediate postprocedural complication. All participants involved with the procedure were surveyed by the radiation safety officer prior to exiting the fluoroscopy suite. The patient was escorted to the nuclear medicine department for post-treatment imaging. FINDINGS: Selective celiac arteriogram demonstrates complete embolization of the gastroduodenal artery without residual flow. Selective right gastric arteriogram demonstrates patency and tortuosity/narrowing of the origin and proximal aspect. The origin of the right gastric artery was again noted to be adjacent to the dominant left hepatic artery. Despite catheterization of the origin of the vessel and advancement of a microwire to its mid aspect, the microcatheter could not be advanced beyond the origin to allow for safe percutaneous coil embolization. 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 The microcatheter was placed proximal to the origin of the accessory right hepatic artery and Y 90 radioembolization was performed from this location. Intermittent contrast injections during and after the radioembolization confirming  preserved antegrade flow without reflux. IMPRESSION: 1. Technically successful radioembolization of the right lobe of the liver with Yttrium-90 microspheres. 2. Attempted though unsuccessful coil embolization of the right gastric artery secondary to tortuosity of the vessel's origin and small caliber of the vessel. PLAN: - Given inability to successfully percutaneously coil embolized the right gastric artery, Y 90 radioembolization of left lobe of the liver will NOT be pursued given  proximity of the right gastric artery to the origin of the left hepatic artery and concern for nontarget embolization in this patient with progressive/advanced intra and extrahepatic metastatic disease. - The patient will be seen in follow-up consultation in the IR clinic in 3-4 weeks following acquisition of postprocedural CMP. - initial surveillance imaging will be performed in 3 months (likely with PET-CT). Electronically Signed   By: Sandi Mariscal M.D.   On: 07/27/2019 13:14   Dg Abd 2 Views  Result Date: 08/16/2019 CLINICAL DATA:  Worsening abdominal pain and constipation. History of right nephrectomy for renal cell carcinoma. Recent Y 90 procedure. EXAM: ABDOMEN - 2 VIEW COMPARISON:  PET-CT 06/17/2019 FINDINGS: The lung bases are clear. No worrisome pulmonary lesions at the lung bases and no pleural effusions. There is some scattered stool in the colon but no findings for constipation. Paucity of small bowel gas. No findings for obstruction or perforation. The soft tissue shadows are grossly maintained. No worrisome calcifications. Arterial coils are noted in the central abdomen. Penile prosthesis is noted. The bony structures are unremarkable. No worrisome bone lesions. IMPRESSION: No plain film findings for an acute abdominal process. No findings for constipation or obstruction. Electronically Signed   By: Marijo Sanes M.D.   On: 08/16/2019 12:43   Ir Embo Tumor Organ Ischemia Infarct Inc Guide Roadmapping  Result Date:  07/27/2019 INDICATION: History of metastatic bladder cancer. Patient presents today for Y 90 radioembolization of the right lobe of the liver Note, pre Y 90 mapping radioembolization demonstrated a right gastric artery however this cannot be successfully coil embolized. As such will also attempt repeat coil embolization of the right gastric artery. EXAM: 1. ULTRASOUND GUIDANCE FOR ARTERIAL ACCESS 2. CELIAC ARTERIOGRAM 3. RIGHT GASTRIC ARTERIOGRAM 4. RIGHT HEPATIC AND RADIOEMBOLIZATION OF THE RIGHT LOBE OF THE LIVER 5. FLUOROSCOPIC GUIDED ADMINISTRATION OF SIR-SPHERES COMPARISON:  Mapping Y 90 radioembolization-07/13/2019; PET-CT-06/17/2019; CT abdomen pelvis-05/24/2019; abdominal MRI - 06/03/2019 MEDICATIONS: Protonix 40 mg IV; Decadron 20 mg IV; Toradol 30 mg IV; Zofran 4 mg IV CONTRAST:  80 cc OMNIPAQUE IOHEXOL 300 MG/ML  SOLN ANESTHESIA/SEDATION: Moderate (conscious) sedation was employed during this procedure. A total of Versed 3 mg and Fentanyl 200 mcg was administered intravenously. Moderate Sedation Time: 70 minutes. The patient's level of consciousness and vital signs were monitored continuously by radiology nursing throughout the procedure under my direct supervision. FLUOROSCOPY TIME:  15 minutes, 30 seconds (1,208 mGy) ACCESS: Right common femoral artery; hemostasis achieved with manual compression. COMPLICATIONS: None immediate. TECHNIQUE: Informed written consent was obtained from the patient after a discussion of the risks, benefits and alternatives to treatment. Questions regarding the procedure were encouraged and answered. A timeout was performed prior to the initiation of the procedure. The right groin was prepped and draped in the usual sterile fashion, and a sterile drape was applied covering the operative field. Maximum barrier sterile technique with sterile gowns and gloves were used for the procedure. A timeout was performed prior to the initiation of the procedure. Local anesthesia was  provided with 1% lidocaine. The right femoral head was marked fluoroscopically. Under direct ultrasound guidance, the right common femoral artery was accessed with a micropuncture kit allowing placement of a of 5-French vascular sheath. An ultrasound image was saved for documentation purposes. A limited arteriogram performed through the side arm of the sheath confirming appropriate access within the right common femoral artery. Over a Britta Mccreedy wire, a Mickelson catheter was advanced to the level of the inferior thoracic aorta where it  was reformed, back bled and flushed. The Mickelson catheter was utilized to select the celiac artery and a celiac arteriogram was performed. Next, with the use of a fathom 14 microwire, a regular Renegade microcatheter was utilized to select the right gastric artery. Despite successful cannulation of the origin the right gastric artery and advancement of a microwire to its mid aspect, the microcatheter would not track beyond the origin the vessel secondary to tortuosity of the vessel's origin as well as it's small caliber. Next, the microcatheter was advanced to the level of the right hepatic artery and a selective right hepatic arteriogram was performed. Appropriate position was confirmed and the Radioembolization was then performed with Yttrium-90 SIR Spheres. Particles were administered via a microcatheter utilizing a completely enclosed system. Monitoring of antegrade flow was performed during and after the administration under fluoroscopy with use of contrast intermittently. After the administration of the particles, the microcatheter, outer catheter and administration system were then discarded into radiation safety receptacle. At this point, the procedure was terminated. The right common femoral approach vascular sheath was removed and hemostasis was achieved with manual compression. A dressing was placed. The patient tolerated procedure well without immediate postprocedural  complication. All participants involved with the procedure were surveyed by the radiation safety officer prior to exiting the fluoroscopy suite. The patient was escorted to the nuclear medicine department for post-treatment imaging. FINDINGS: Selective celiac arteriogram demonstrates complete embolization of the gastroduodenal artery without residual flow. Selective right gastric arteriogram demonstrates patency and tortuosity/narrowing of the origin and proximal aspect. The origin of the right gastric artery was again noted to be adjacent to the dominant left hepatic artery. Despite catheterization of the origin of the vessel and advancement of a microwire to its mid aspect, the microcatheter could not be advanced beyond the origin to allow for safe percutaneous coil embolization. 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 The microcatheter was placed proximal to the origin of the accessory right hepatic artery and Y 90 radioembolization was performed from this location. Intermittent contrast injections during and after the radioembolization confirming preserved antegrade flow without reflux. IMPRESSION: 1. Technically successful radioembolization of the right lobe of the liver with Yttrium-90 microspheres. 2. Attempted though unsuccessful coil embolization of the right gastric artery secondary to tortuosity of the vessel's origin and small caliber of the vessel. PLAN: - Given inability to successfully percutaneously coil embolized the right gastric artery, Y 90 radioembolization of left lobe of the liver will NOT be pursued given proximity of the right gastric artery to the origin of the left hepatic artery and concern for nontarget embolization in this patient with progressive/advanced intra and extrahepatic metastatic disease. - The patient will be seen in follow-up consultation in the IR clinic  in 3-4 weeks following acquisition of postprocedural CMP. - initial surveillance imaging will be performed in 3 months (likely with PET-CT). Electronically Signed   By: Sandi Mariscal M.D.   On: 07/27/2019 13:14   Nm Radio Pharm Therapy Intraarterial  Result Date: 07/27/2019 CLINICAL DATA:  Unresectable liver metastasis. Bladder carcinoma primary. First treatment to the RIGHT hepatic lobe. EXAM: NUCLEAR MEDICINE SPECIAL MED RAD PHYSICS CONS; NUCLEAR MEDICINE RADIO PHARM THERAPY INTRA ARTERIAL; NUCLEAR MEDICINE TREATMENT PROCEDURE; NUCLEAR MEDICINE LIVER SCAN TECHNIQUE: In conjunction with the interventional radiologist a Y- Microsphere dose was calculated utilizing body surface area formulation. Calculated dose equal 28.2 mCi. Pre therapy MAA liver SPECT scan  and CTA were evaluated. Utilizing a microcatheter system, the RIGHT hepatic artery was selected and Y-90 microspheres were delivered in fractionated aliquots. Radiopharmaceutical was delivered by the interventional radiologist and nuclear radiologist. The patient tolerated procedure well. No adverse effects were noted. Bremsstrahlung planar and SPECT imaging of the abdomen following intrahepatic arterial delivery of Y-90 microsphere was performed. RADIOPHARMACEUTICALS:  38.3 millicuries yttrium 90 microspheres. COMPARISON:  PET-CT 06/17/2019, MRI 06/03/2019 FINDINGS: Y - 90 microspheres therapy as above. First therapy the right hepatic lobe. Bremsstrahlung planar and SPECT imaging of the abdomen following intrahepatic arterial delivery of Y-5mcrosphere demonstrates radioactivity localized to the RIGHT hepatic lobe. No evidence of extrahepatic activity. IMPRESSION: Successful Y - 90 microsphere delivery for treatment of unresectable liver metastasis. First therapy to the RIGHT lobe. Bremssstrahlung scan demonstrates activity localized to RIGHT hepatic lobe with no extrahepatic activity identified. Electronically Signed   By: SSuzy BouchardM.D.   On:  07/27/2019 12:57    PERFORMANCE STATUS (ECOG) : 1 - Symptomatic but completely ambulatory  Review of Systems Unless otherwise noted, a complete review of systems is negative.  Physical Exam General: NAD, frail appearing, thin Pulmonary: Unlabored Extremities: no edema Skin: no rashes Neurological: Weakness but otherwise nonfocal  IMPRESSION: Follow-up visit with patient today in the clinic.  Patient saw Dr. FGrayland Ormondearlier this morning.  Recent CT scan reveals clear evidence of disease progression.  Patient is pending RT next week to his scapula, which hopefully may improve his pain.  Further systemic treatment with chemotherapy is likely not an option.  Hospice has been discussed.  I had a conversation today with patient explaining the details of hospice involvement at home.  He wants to speak with his daughter prior to making any final decisions.  I again discussed advance care planning.  Patient says that he has discussed this with his daughter but could not remember what decisions were made.  I offered to call his daughter but patient declined.  Patient brought in ACP documents but they were not signed or notarized.  Discussed the process in completing those documents and asked that he bring [place] copy after they are signed, witnessed, and notarized.  Patient says that he is still talking with his daughter about the MOST Form.  Case discussed with Dr. FGrayland Ormond PLAN: -Best supportive care -Recommend DNR/DNI.  Will readdress with patient/daughter -Continue oxycodone as needed for pain -Prophylactic bowel regimen -Follow-up telephone visit next week   Patient expressed understanding and was in agreement with this plan. He also understands that He can call the clinic at any time with any questions, concerns, or complaints.     Time Total: 15 minutes  Visit consisted of counseling and education dealing with the complex and emotionally intense issues of symptom management and  palliative care in the setting of serious and potentially life-threatening illness.Greater than 50%  of this time was spent counseling and coordinating care related to the above assessment and plan.  Signed by: JAltha Harm PhD, NP-C [phone](Work Cell)

## 2019-08-24 ENCOUNTER — Other Ambulatory Visit (HOSPITAL_COMMUNITY): Payer: Medicare Other

## 2019-08-24 ENCOUNTER — Ambulatory Visit (HOSPITAL_COMMUNITY): Payer: Medicare Other

## 2019-08-24 ENCOUNTER — Ambulatory Visit: Payer: Medicare Other

## 2019-08-24 ENCOUNTER — Ambulatory Visit
Admission: RE | Admit: 2019-08-24 | Discharge: 2019-08-24 | Disposition: A | Discharge status: Home / Self Care | Payer: Medicare Other | Source: Ambulatory Visit | Attending: Radiation Oncology | Admitting: Radiation Oncology

## 2019-08-24 ENCOUNTER — Other Ambulatory Visit: Payer: Self-pay

## 2019-08-24 DIAGNOSIS — Z510 Encounter for antineoplastic radiation therapy: Secondary | ICD-10-CM | POA: Diagnosis not present

## 2019-08-24 LAB — THYROID PANEL WITH TSH
Free Thyroxine Index: 1.5 (ref 1.2–4.9)
T3 Uptake Ratio: 31 % (ref 24–39)
T4, Total: 4.7 ug/dL (ref 4.5–12.0)
TSH: 7.1 u[IU]/mL — ABNORMAL HIGH (ref 0.450–4.500)

## 2019-08-25 ENCOUNTER — Ambulatory Visit
Admission: RE | Admit: 2019-08-25 | Discharge: 2019-08-25 | Disposition: A | Discharge status: Home / Self Care | Payer: Medicare Other | Source: Ambulatory Visit | Attending: Radiation Oncology | Admitting: Radiation Oncology

## 2019-08-25 ENCOUNTER — Other Ambulatory Visit: Payer: Self-pay

## 2019-08-25 ENCOUNTER — Ambulatory Visit: Payer: Medicare Other

## 2019-08-25 ENCOUNTER — Telehealth: Payer: Self-pay | Admitting: *Deleted

## 2019-08-25 DIAGNOSIS — Z510 Encounter for antineoplastic radiation therapy: Secondary | ICD-10-CM | POA: Diagnosis not present

## 2019-08-25 DIAGNOSIS — R140 Abdominal distension (gaseous): Secondary | ICD-10-CM

## 2019-08-25 NOTE — Telephone Encounter (Signed)
David Rich called requesting that patient have another paracentesis as his abdominal is more distended. She reports that he just had a paracentesis 11/16.(1 L of fluid was removed per radiology report). Please advise

## 2019-08-25 NOTE — Telephone Encounter (Signed)
I'd be surprised if it re-accumulated that quickly, but possible so yes we can try to get another one this week.

## 2019-08-25 NOTE — Telephone Encounter (Signed)
Order placed paperwork sent

## 2019-08-26 ENCOUNTER — Ambulatory Visit
Admission: RE | Admit: 2019-08-26 | Discharge: 2019-08-26 | Disposition: A | Discharge status: Home / Self Care | Payer: Medicare Other | Source: Ambulatory Visit | Attending: Radiation Oncology | Admitting: Radiation Oncology

## 2019-08-26 ENCOUNTER — Telehealth: Payer: Self-pay | Admitting: Emergency Medicine

## 2019-08-26 ENCOUNTER — Other Ambulatory Visit: Payer: Self-pay

## 2019-08-26 DIAGNOSIS — Z510 Encounter for antineoplastic radiation therapy: Secondary | ICD-10-CM | POA: Diagnosis not present

## 2019-08-26 NOTE — Telephone Encounter (Signed)
Called and let patient know that he's scheduled for paracentesis tomorrow, 11/20 at 12:30. Patient and daughter verbalized understanding.

## 2019-08-27 ENCOUNTER — Telehealth: Payer: Self-pay | Admitting: *Deleted

## 2019-08-27 ENCOUNTER — Ambulatory Visit
Admission: RE | Admit: 2019-08-27 | Discharge: 2019-08-27 | Disposition: A | Discharge status: Home / Self Care | Payer: Medicare Other | Source: Ambulatory Visit | Attending: Oncology | Admitting: Oncology

## 2019-08-27 ENCOUNTER — Ambulatory Visit: Payer: Medicare Other

## 2019-08-27 ENCOUNTER — Other Ambulatory Visit: Payer: Self-pay | Admitting: Oncology

## 2019-08-27 DIAGNOSIS — R140 Abdominal distension (gaseous): Secondary | ICD-10-CM

## 2019-08-27 NOTE — Telephone Encounter (Signed)
Yes

## 2019-08-27 NOTE — Telephone Encounter (Signed)
Daughter called asking for Hospice referral and Hospice also called asking if Dr Grayland Ormond will sign orders and serve as attending. Please advise

## 2019-08-27 NOTE — Telephone Encounter (Signed)
VERBAL ORDER called to Hospice Triage [facility]

## 2019-08-30 ENCOUNTER — Ambulatory Visit: Payer: Medicare Other

## 2019-08-30 ENCOUNTER — Inpatient Hospital Stay (HOSPITAL_BASED_OUTPATIENT_CLINIC_OR_DEPARTMENT_OTHER): Payer: Medicare Other | Admitting: Hospice and Palliative Medicine

## 2019-08-30 DIAGNOSIS — Z515 Encounter for palliative care: Secondary | ICD-10-CM

## 2019-08-31 ENCOUNTER — Ambulatory Visit: Payer: Medicare Other

## 2019-08-31 ENCOUNTER — Telehealth: Payer: Self-pay | Admitting: *Deleted

## 2019-08-31 ENCOUNTER — Inpatient Hospital Stay: Payer: Medicare Other | Admitting: Oncology

## 2019-09-01 ENCOUNTER — Ambulatory Visit: Payer: Medicare Other

## 2019-09-06 ENCOUNTER — Ambulatory Visit: Payer: Medicare Other

## 2019-09-07 NOTE — Progress Notes (Signed)
Telephone visit was scheduled for today.  Attempted to call patient without success.  Called and spoke with his hospice nurse [facility]) who made a home visit today.  Patient is actively declining.  He does have some agitation and we discussed utilization of Haldol elixir if needed.  Pain is reportedly well controlled. She will let us know if patient has any needs.

## 2019-09-07 NOTE — Telephone Encounter (Signed)
Mitzi with hospice called to report that patient expired this morning at 5:24 AM

## 2019-09-07 DEATH — deceased

## 2019-10-11 ENCOUNTER — Ambulatory Visit: Payer: Medicare Other | Admitting: Radiation Oncology

## 2021-03-25 IMAGING — CT NM PET [PERSON_NAME] (PS) SKULL BASE T - THIGH
1 of 9 series · 2 of 25 positions shown · non-contrast
Comparison: Abdominal MRI 06/04/2019. Abdomen/pelvis CT 05/24/2019.
PET-CT 06/29/2018.

CLINICAL DATA: Subsequent treatment strategy for uroepithelial
carcinoma of the bladder.

EXAM:
NUCLEAR MEDICINE PET SKULL BASE TO THIGH
TECHNIQUE: 10.5 mCi F-18 FDG was injected intravenously. Full-ring PET imaging
was performed from the skull base to thigh after the radiotracer. CT
data was obtained and used for attenuation correction and anatomic
localization.
Fasting blood glucose: 90 mg/dl

[Series 3: ct wb 5.0 b30f · axial · 5.0mm · 0.98mm/px · z∈[-1048,-64]mm · 2 of 329 slices shown]
[im 1/329  brain]
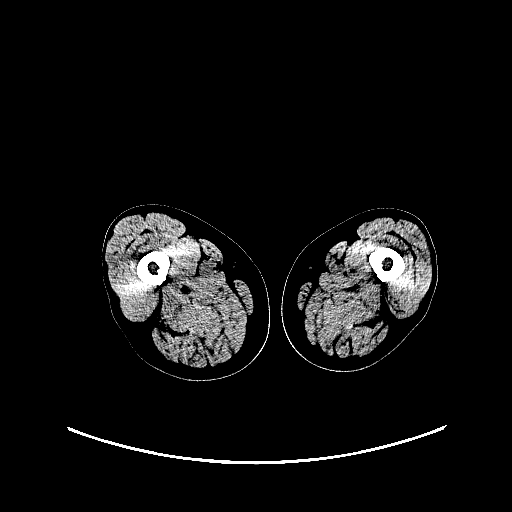
[im 329/329  brain]
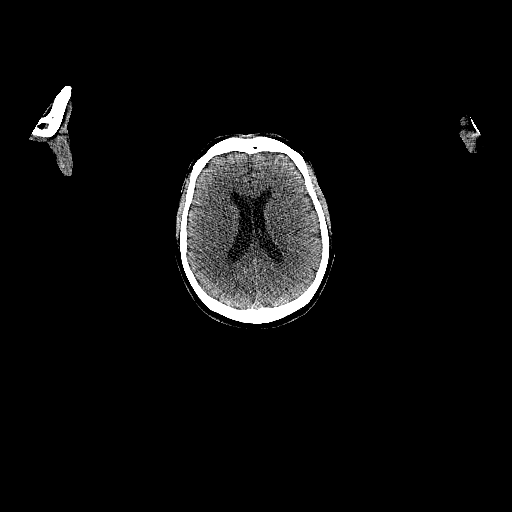

[2 of 25 positions shown; findings below may reference images not displayed]

FINDINGS: Mediastinal blood pool activity: SUV max

Liver activity: SUV max NA

NECK: No hypermetabolic lymph nodes in the neck.

Incidental CT findings: none

CHEST: Hypermetabolic lymphadenopathy is seen in the left
supraclavicular region. Index 9 mm short axis node on image
63/series 3 demonstrates SUV max = 9.0 hypermetabolic left
supraclavicular node on 73/3 demonstrates SUV max = 12.1. No
evidence for hypermetabolic mediastinal or hilar lymphadenopathy. No
hypermetabolic axillary adenopathy.

Hypermetabolic pulmonary nodules are identified in the lungs
bilaterally. Index 8 mm anterior right upper lobe nodule (118/3)
demonstrates SUV max = 5.9. 14 mm irregular nodule posterior right
lower lobe on [DATE] [DATE] has SUV max = 6.0.

Incidental CT findings: Coronary artery calcification is evident.
Atherosclerotic calcification is noted in the wall of the thoracic
aorta. Right Port-A-Cath tip is positioned at the SVC/RA junction.
Centrilobular and paraseptal emphysema evident.

ABDOMEN/PELVIS: Bulky hypermetabolic liver metastases are evident. A
dominant lesion in the dome of liver seen on recent MRI at 4.2 cm
demonstrates SUV max = 10.2. Posterior right hepatic lesion
measuring 2.5 cm on image 155/3 has SUV max = 10.2. 2.7 cm lesion
inferior right liver on 192/3 demonstrates SUV max = 9.8.

Hypermetabolic lymphadenopathy noted in the right retrocrural space.
Retroperitoneal lymphadenopathy is hypermetabolic. Index 17 mm left
para-aortic node on 191/3 demonstrates SUV max = 17.4.
Hypermetabolic retroperitoneal lymphadenopathy extends into the
pelvis with hypermetabolic lymph nodes identified in the right
pelvic sidewall. Index 15 mm short axis right pelvic sidewall node
on 248/3 has SUV max = 17.8.

Incidental CT findings: Tiny calcified gallstone evident. Right
kidney surgically absent. Diffuse colonic diverticulosis without
diverticulitis. Penile prosthetic device evident. There is abdominal
aortic atherosclerosis without aneurysm.

SKELETON: Hypermetabolic lesion noted inferior right scapula with
SUV max = 3.8. Mixed lytic and sclerotic lesion in the L2 vertebral
body is hypermetabolic with SUV max = 6.6 metastatic lesion noted L3
level.

Incidental CT findings: Mild degenerative changes noted in the hips.
IMPRESSION: 1. Hypermetabolic nodal metastases identified in the left
supraclavicular space, right retrocrural space, abdominal
retroperitoneal space, and extraperitoneal nodal chains of the
posterior pelvis and right pelvic sidewall.
2. Hypermetabolic pulmonary nodules consistent with metastatic
disease.
3. Bulky hypermetabolic liver metastases.
4. Hypermetabolic bone lesions including the inferior right scapula
and L2 vertebral body consistent with metastatic involvement.
5. Cholelithiasis.
6.  Aortic Atherosclerois ([id number])
